# Patient Record
Sex: Male | Born: 1945 | Race: White | Hispanic: No | Marital: Single | Smoking: Former smoker
Health system: Southern US, Community
[De-identification: ages and names within clinical notes are randomized; demographics above are authoritative.]

## PROBLEM LIST (undated history)

## (undated) DIAGNOSIS — C189 Malignant neoplasm of colon, unspecified: Secondary | ICD-10-CM

## (undated) DIAGNOSIS — C787 Secondary malignant neoplasm of liver and intrahepatic bile duct: Secondary | ICD-10-CM

## (undated) DIAGNOSIS — C7951 Secondary malignant neoplasm of bone: Secondary | ICD-10-CM

## (undated) HISTORY — PX: SHOULDER SURGERY: SHX246

## (undated) HISTORY — PX: KNEE SURGERY: SHX244

## (undated) HISTORY — PX: APPENDECTOMY: SHX54

---

## 2006-11-15 ENCOUNTER — Ambulatory Visit (HOSPITAL_COMMUNITY): Admission: RE | Admit: 2006-11-15 | Discharge: 2006-11-15 | Payer: Self-pay | Admitting: Orthopedic Surgery

## 2006-11-18 ENCOUNTER — Encounter: Admission: RE | Admit: 2006-11-18 | Discharge: 2006-11-18 | Payer: Self-pay | Admitting: Orthopedic Surgery

## 2008-12-14 ENCOUNTER — Encounter: Admission: RE | Admit: 2008-12-14 | Discharge: 2008-12-14 | Payer: Self-pay | Admitting: Orthopedic Surgery

## 2008-12-23 ENCOUNTER — Ambulatory Visit (HOSPITAL_COMMUNITY): Admission: RE | Admit: 2008-12-23 | Discharge: 2008-12-24 | Payer: Self-pay | Admitting: Orthopedic Surgery

## 2011-01-13 LAB — HEMOGLOBIN AND HEMATOCRIT, BLOOD: HCT: 43.3 % (ref 39.0–52.0)

## 2011-02-15 NOTE — Op Note (Signed)
NAMEJOAQUIN, KNEBEL                ACCOUNT NO.:  [id number]   MEDICAL RECORD NO.:  [id number]          PATIENT TYPE:  AMB   LOCATION:  DAY                          FACILITY:  [facility]   PHYSICIAN:  Marlowe Kays, M.D.  DATE OF BIRTH:  1945/12/29   DATE OF PROCEDURE:  DATE OF DISCHARGE:                               OPERATIVE REPORT   PREOPERATIVE DIAGNOSES:  Rotator cuff tear right shoulder.   POSTOPERATIVE DIAGNOSIS:  Rotator cuff tear right shoulder.   OPERATION:  Anterior acromionectomy, planing of distal clavicle and  repair of torn rotator cuff right shoulder.   SURGEON:  [place]   ASSISTANT:  Mr. Idolina Primer, [place].   ANESTHESIA:  General.   PATHOLOGIST:   INDICATIONS FOR PROCEDURE:  He has had significant right shoulder pain  leading to an MRI on December 14, 2008 which showed a large full-thickness  retracted tear of supraspinatus tendon.  This was confirmed at surgery  with a 3 mm wide and 2 cm retracted tear.  This was associated with a  very significant impingement problem as discussed below.   PROCEDURE:  Prophylactic antibiotics, interscalene block by anesthesia  prior to satisfactory general anesthesia, placed on the sliding frame  and in beach-chair position, the right shoulder girdle was prepped with  DuraPrep, draped in sterile field.  Time-out performed.  Vertical  midline incision down to the fascia overlying the anterior acromion  which I opened in line with the skin incision.  I placed a small Cobb  elevator beneath the anterior acromion.  It was a very tight impingement  problem. I made my initial anterior acromionectomy with micro saw and  then began removing additional bone for further decompression, worked  back up underneath the distal clavicle which also was creating  impingement problem as well.  He had a very thick fascia on top of the  rotator cuff which I excised and I was able to outline the rotator cuff  tear.  The rotator cuff itself appeared to  be healthy with about a 3 cm  width and 2 cm retraction.  After assessing the anatomy of the tear, I  used to four stranded striker rotator cuff anchors interspersing them  throughout the width of the tear and tying them down.  I then used the  terminal portions of each suture to work through the terminal portion of  the rotator cuff into the soft tissue of the lateral humerus giving  additional strength and smoothing it down.  At the conclusion of the  repair, the repair was seen to be nice and stable with his arm to his  side.  Part of the repair was medial to the biceps tendon which was a  little discolored but was intact.  The wound was then well irrigated  with sterile saline and I closed the deltoid and deltoid fascia interval  with  interrupted #1 Vicryl, subcu tissue with 2-0 Vicryl, Steri-Strips on the  skin followed by dry sterile dressing and shoulder immobilizer.  He  tolerated the procedure well was taken to recovery in satisfactory  condition with no known complications.  ______________________________  Marlowe Kays, M.D.     JA/MEDQ  D:  12/23/2008  T:  12/23/2008  Job:  [id number]

## 2015-02-12 ENCOUNTER — Other Ambulatory Visit: Payer: Self-pay | Admitting: Family Medicine

## 2015-02-12 DIAGNOSIS — R634 Abnormal weight loss: Secondary | ICD-10-CM

## 2015-02-16 ENCOUNTER — Other Ambulatory Visit (HOSPITAL_COMMUNITY): Payer: Self-pay

## 2015-02-16 ENCOUNTER — Other Ambulatory Visit: Payer: Self-pay

## 2015-02-16 ENCOUNTER — Encounter (HOSPITAL_COMMUNITY): Payer: Self-pay

## 2015-02-16 ENCOUNTER — Inpatient Hospital Stay (HOSPITAL_COMMUNITY)
Admission: EM | Admit: 2015-02-16 | Discharge: 2015-02-19 | DRG: 811 | Disposition: A | Discharge status: Home / Self Care | Payer: Medicare Other | Attending: Internal Medicine | Admitting: Internal Medicine

## 2015-02-16 ENCOUNTER — Inpatient Hospital Stay (HOSPITAL_COMMUNITY): Payer: Medicare Other

## 2015-02-16 ENCOUNTER — Inpatient Hospital Stay: Admission: RE | Admit: 2015-02-16 | Payer: Self-pay | Source: Ambulatory Visit

## 2015-02-16 DIAGNOSIS — Z885 Allergy status to narcotic agent status: Secondary | ICD-10-CM | POA: Diagnosis not present

## 2015-02-16 DIAGNOSIS — Z681 Body mass index (BMI) 19 or less, adult: Secondary | ICD-10-CM | POA: Diagnosis not present

## 2015-02-16 DIAGNOSIS — D649 Anemia, unspecified: Secondary | ICD-10-CM | POA: Diagnosis not present

## 2015-02-16 DIAGNOSIS — Z79899 Other long term (current) drug therapy: Secondary | ICD-10-CM

## 2015-02-16 DIAGNOSIS — C189 Malignant neoplasm of colon, unspecified: Secondary | ICD-10-CM | POA: Diagnosis present

## 2015-02-16 DIAGNOSIS — Z87891 Personal history of nicotine dependence: Secondary | ICD-10-CM

## 2015-02-16 DIAGNOSIS — E43 Unspecified severe protein-calorie malnutrition: Secondary | ICD-10-CM | POA: Diagnosis present

## 2015-02-16 DIAGNOSIS — D473 Essential (hemorrhagic) thrombocythemia: Secondary | ICD-10-CM | POA: Diagnosis present

## 2015-02-16 DIAGNOSIS — R197 Diarrhea, unspecified: Secondary | ICD-10-CM

## 2015-02-16 DIAGNOSIS — D509 Iron deficiency anemia, unspecified: Secondary | ICD-10-CM

## 2015-02-16 DIAGNOSIS — R0609 Other forms of dyspnea: Secondary | ICD-10-CM | POA: Diagnosis present

## 2015-02-16 DIAGNOSIS — E46 Unspecified protein-calorie malnutrition: Secondary | ICD-10-CM | POA: Diagnosis present

## 2015-02-16 DIAGNOSIS — Z791 Long term (current) use of non-steroidal anti-inflammatories (NSAID): Secondary | ICD-10-CM | POA: Diagnosis not present

## 2015-02-16 DIAGNOSIS — R935 Abnormal findings on diagnostic imaging of other abdominal regions, including retroperitoneum: Secondary | ICD-10-CM

## 2015-02-16 DIAGNOSIS — Z7982 Long term (current) use of aspirin: Secondary | ICD-10-CM

## 2015-02-16 DIAGNOSIS — R0600 Dyspnea, unspecified: Secondary | ICD-10-CM | POA: Diagnosis not present

## 2015-02-16 DIAGNOSIS — K529 Noninfective gastroenteritis and colitis, unspecified: Secondary | ICD-10-CM | POA: Diagnosis present

## 2015-02-16 DIAGNOSIS — R5383 Other fatigue: Secondary | ICD-10-CM | POA: Diagnosis not present

## 2015-02-16 DIAGNOSIS — R634 Abnormal weight loss: Secondary | ICD-10-CM | POA: Diagnosis present

## 2015-02-16 DIAGNOSIS — C787 Secondary malignant neoplasm of liver and intrahepatic bile duct: Secondary | ICD-10-CM | POA: Diagnosis present

## 2015-02-16 DIAGNOSIS — D500 Iron deficiency anemia secondary to blood loss (chronic): Principal | ICD-10-CM | POA: Diagnosis present

## 2015-02-16 DIAGNOSIS — E8809 Other disorders of plasma-protein metabolism, not elsewhere classified: Secondary | ICD-10-CM | POA: Diagnosis present

## 2015-02-16 DIAGNOSIS — D75839 Thrombocytosis, unspecified: Secondary | ICD-10-CM | POA: Diagnosis present

## 2015-02-16 DIAGNOSIS — D508 Other iron deficiency anemias: Secondary | ICD-10-CM | POA: Diagnosis not present

## 2015-02-16 HISTORY — DX: Secondary malignant neoplasm of liver and intrahepatic bile duct: C78.7

## 2015-02-16 LAB — PREPARE RBC (CROSSMATCH)

## 2015-02-16 LAB — CBC WITH DIFFERENTIAL/PLATELET
BASOS ABS: 0.1 10*3/uL (ref 0.0–0.1)
Basophils Relative: 1 % (ref 0–1)
EOS ABS: 0.2 10*3/uL (ref 0.0–0.7)
Eosinophils Relative: 2 % (ref 0–5)
HCT: 23.9 % — ABNORMAL LOW (ref 39.0–52.0)
Hemoglobin: 6.7 g/dL — CL (ref 13.0–17.0)
LYMPHS ABS: 1 10*3/uL (ref 0.7–4.0)
Lymphocytes Relative: 12 % (ref 12–46)
MCH: 18.2 pg — AB (ref 26.0–34.0)
MCHC: 28 g/dL — ABNORMAL LOW (ref 30.0–36.0)
MCV: 64.9 fL — AB (ref 78.0–100.0)
MONO ABS: 1 10*3/uL (ref 0.1–1.0)
Monocytes Relative: 12 % (ref 3–12)
NEUTROS PCT: 73 % (ref 43–77)
Neutro Abs: 6 10*3/uL (ref 1.7–7.7)
PLATELETS: 1067 10*3/uL — AB (ref 150–400)
RBC: 3.68 MIL/uL — ABNORMAL LOW (ref 4.22–5.81)
RDW: 18.4 % — AB (ref 11.5–15.5)
WBC: 8.3 10*3/uL (ref 4.0–10.5)

## 2015-02-16 LAB — TSH: TSH: 4.504 u[IU]/mL — AB (ref 0.350–4.500)

## 2015-02-16 LAB — RETICULOCYTES
RBC.: 3.79 MIL/uL — AB (ref 4.22–5.81)
RETIC COUNT ABSOLUTE: 53.1 10*3/uL (ref 19.0–186.0)
RETIC CT PCT: 1.4 % (ref 0.4–3.1)

## 2015-02-16 LAB — COMPREHENSIVE METABOLIC PANEL
ALBUMIN: 2.8 g/dL — AB (ref 3.5–5.0)
ALK PHOS: 106 U/L (ref 38–126)
ALT: 13 U/L — ABNORMAL LOW (ref 17–63)
ANION GAP: 9 (ref 5–15)
AST: 15 U/L (ref 15–41)
BUN: 20 mg/dL (ref 6–20)
CO2: 26 mmol/L (ref 22–32)
CREATININE: 0.81 mg/dL (ref 0.61–1.24)
Calcium: 8.9 mg/dL (ref 8.9–10.3)
Chloride: 100 mmol/L — ABNORMAL LOW (ref 101–111)
GFR calc Af Amer: >60 mL/min (ref >60)
GFR calc non Af Amer: >60 mL/min (ref >60)
Glucose, Bld: 97 mg/dL (ref 65–99)
POTASSIUM: 4.1 mmol/L (ref 3.5–5.1)
Sodium: 135 mmol/L (ref 135–145)
TOTAL PROTEIN: 6.3 g/dL — AB (ref 6.5–8.1)
Total Bilirubin: 0.3 mg/dL (ref 0.3–1.2)

## 2015-02-16 LAB — IRON AND TIBC
IRON: 8 ug/dL — AB (ref 45–182)
SATURATION RATIOS: 2 % — AB (ref 17.9–39.5)
TIBC: 347 ug/dL (ref 250–450)
UIBC: 339 ug/dL

## 2015-02-16 LAB — FOLATE: FOLATE: 27 ng/mL (ref >5.9)

## 2015-02-16 LAB — POC OCCULT BLOOD, ED: Fecal Occult Bld: NEGATIVE

## 2015-02-16 LAB — D-DIMER, QUANTITATIVE (NOT AT ARMC): D DIMER QUANT: 0.92 ug{FEU}/mL — AB (ref 0.00–0.48)

## 2015-02-16 LAB — LIPASE, BLOOD: LIPASE: 35 U/L (ref 22–51)

## 2015-02-16 LAB — PREALBUMIN: Prealbumin: 19.6 mg/dL (ref 18–38)

## 2015-02-16 LAB — SEDIMENTATION RATE: SED RATE: 34 mm/h — AB (ref 0–16)

## 2015-02-16 LAB — ABO/RH: ABO/RH(D): O POS

## 2015-02-16 LAB — VITAMIN B12: VITAMIN B 12: 651 pg/mL (ref 180–914)

## 2015-02-16 LAB — FERRITIN: FERRITIN: 15 ng/mL — AB (ref 24–336)

## 2015-02-16 MED ORDER — SODIUM CHLORIDE 0.9 % IJ SOLN
3.0000 mL | Freq: Two times a day (BID) | INTRAMUSCULAR | Status: DC
  Administered 2015-02-17 – 2015-02-19 (×5): 3 mL via INTRAVENOUS

## 2015-02-16 MED ORDER — SODIUM CHLORIDE 0.9 % IV SOLN
25.0000 mg | Freq: Once | INTRAVENOUS | Status: AC
  Administered 2015-02-16: 25 mg via INTRAVENOUS
  Filled 2015-02-16 (×2): qty 0.5

## 2015-02-16 MED ORDER — ONDANSETRON HCL 4 MG PO TABS: 4.0000 mg | ORAL_TABLET | Freq: Four times a day (QID) | ORAL | Status: DC | PRN

## 2015-02-16 MED ORDER — OXYCODONE HCL 5 MG PO TABS
5.0000 mg | ORAL_TABLET | ORAL | Status: DC | PRN
  Administered 2015-02-17: 5 mg via ORAL
  Filled 2015-02-16: qty 1

## 2015-02-16 MED ORDER — SODIUM CHLORIDE 0.9 % IV SOLN
250.0000 mg | Freq: Once | INTRAVENOUS | Status: AC
  Administered 2015-02-16: 250 mg via INTRAVENOUS
  Filled 2015-02-16 (×3): qty 5

## 2015-02-16 MED ORDER — ACETAMINOPHEN 650 MG RE SUPP: 650.0000 mg | Freq: Four times a day (QID) | RECTAL | Status: DC | PRN

## 2015-02-16 MED ORDER — ACETAMINOPHEN 325 MG PO TABS: 650.0000 mg | ORAL_TABLET | Freq: Four times a day (QID) | ORAL | Status: DC | PRN

## 2015-02-16 MED ORDER — IOHEXOL 300 MG/ML  SOLN: 25.0000 mL | Freq: Once | INTRAMUSCULAR | Status: AC | PRN

## 2015-02-16 MED ORDER — MORPHINE SULFATE 2 MG/ML IJ SOLN
1.0000 mg | INTRAMUSCULAR | Status: DC | PRN
  Administered 2015-02-16: 1 mg via INTRAVENOUS
  Filled 2015-02-16: qty 1

## 2015-02-16 MED ORDER — ALUM & MAG HYDROXIDE-SIMETH 200-200-20 MG/5ML PO SUSP: 30.0000 mL | Freq: Four times a day (QID) | ORAL | Status: DC | PRN

## 2015-02-16 MED ORDER — ONDANSETRON HCL 4 MG/2ML IJ SOLN: 4.0000 mg | Freq: Four times a day (QID) | INTRAMUSCULAR | Status: DC | PRN

## 2015-02-16 MED ORDER — SODIUM CHLORIDE 0.9 % IV SOLN
Freq: Once | INTRAVENOUS | Status: AC
  Administered 2015-02-16: 16:00:00 via INTRAVENOUS

## 2015-02-16 MED ORDER — HEPARIN SODIUM (PORCINE) 5000 UNIT/ML IJ SOLN
5000.0000 [IU] | Freq: Three times a day (TID) | INTRAMUSCULAR | Status: DC
Start: 2015-02-16 — End: 2015-02-17
  Administered 2015-02-17 (×2): 5000 [IU] via SUBCUTANEOUS
  Filled 2015-02-16 (×3): qty 1

## 2015-02-16 MED ORDER — IOHEXOL 300 MG/ML  SOLN
80.0000 mL | Freq: Once | INTRAMUSCULAR | Status: AC | PRN
  Administered 2015-02-16: 80 mL via INTRAVENOUS

## 2015-02-16 NOTE — ED Notes (Signed)
1067 - Platelets  6.7- Hemoglobin  Reported to Dr. Regenia Skeeter @1222 

## 2015-02-16 NOTE — ED Notes (Signed)
Admitting at bedside 

## 2015-02-16 NOTE — ED Notes (Signed)
Pt comes in after being called by PCP for having abnormal labs. Reports trouble with bowels for the past couple of months, also reports shortness of breath.

## 2015-02-16 NOTE — Progress Notes (Signed)
Patient ID: Ryan Cowan, male   DOB: 07-11-1946, 69 y.o.   MRN: [id number] Hematology: Courtesy consult: 69 year old man who presents with an approximate three-month history of diarrhea without melena or hematochezia, approximate 15 pound weight loss, and increasing weakness and dyspnea. He is found to have a significant microcytic anemia with hemoglobin 6.7, hematocrit 24%, MCV 65, white count 8300 with 73% neutrophils, platelet count 1,067,000. Reticulocyte count 1.4%. No antibodies detected on a type and crossmatch. Iron studies confirm clinical suspicion of severe iron deficiency with serum iron 8, percent saturation 2, TIBC 347, and ferritin 15. I have reviewed his peripheral blood film. Findings are typical of iron deficiency with hypochromic, microcytic, red cells, 2-3 plus anisopoikilocytosis. RDW is elevated. Neutrophils and lymphocytes appear mature. 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 In view of unexplained GI symptoms and weight loss, evaluation indicated to rule out GI malignancy. In general, reactive thrombocytosis does not increase risk for thrombotic events. Routine thromboprophylaxis is sufficient.

## 2015-02-16 NOTE — H&P (Signed)
[facility] History and Physical                                                                                    Ryan Cowan, is a 69 y.o. male  MRN: [id number]   DOB - Jun 06, 1946  Admit Date - 02/16/2015  Outpatient Primary MD for the patient is Ryan Downing, MD  Referring MD: Post / ER  With History of -  History reviewed. No pertinent past medical history.    Past Surgical History  Procedure Laterality Date  . Shoulder surgery    . Knee surgery    . Appendectomy      in for   Chief Complaint  Patient presents with  . Shortness of Breath  . 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 Patient was initially evaluated at his primary care physician's office today for several complaints. First is had ongoing diarrhea for several months and recently has noticed increased shortness of breath and dyspnea on exertion. Regarding the diarrhea, again this is been going on for several months despite type of food intake. He occasionally has fecal urgency symptoms prior to onset of bouts of diarrhea, he has chronic abdominal bloating, he's not noticed any blood or dark stools, has not had any cramping or abdominal pain otherwise prior to onset of stools. He describes the stools as being very loose and brown in color and occasionally he does have some formed stools. Patient reports that several years ago his baseline weight was 165 pounds, 1 year ago was documented at 145 pounds at his primary care physician's office and today weight is down to 128 pounds. In regards to his shortness of breath and dyspnea on exertion this has been ongoing for several months and since the onset of his diarrhea. He sleeps mainly in the recliner because he feels claustrophobic laying down in his bed in his room. At times he will awaken with sweats at night  and feel generally weak. Sometimes when he is up in the recliner with his feet elevated he feels like he can't rest or breathe so he has to get up and walk around. He does have a history of hemorrhoids and does have some rectal pain that radiates to the perineum after passage of bowel movements at times. Several weeks ago he had what he described as a dry barking cough was nonproductive. He is having difficulty quantifying whether he has any difficulty swallowing although when the question was rephrased as are you having difficulty swallowing meats or salads he stated no. He also reports for several years intermittent left anterior chest pain without any specific aggravating or alleviating factors and no other associated symptoms with this chest pain.  In the ER he was afebrile and hypertensive and heart rate 92. Room air saturations are 100%. Electrolyte panel was unremarkable except for albumin of 2.8 ALT 13 and total protein of 6.3. More worrisome was his CBC he had a hemoglobin of 6.7, MCV of 64.9, and  platelets were markedly elevated at 1,067,000. That's my evaluation we have asked for a d-dimer which was mildly positive at 0.92. I have ordered a 2 view chest x-ray as well as a CT of the abdomen and pelvis with contrast which is pending at time of admission. ER he was fecal occult blood negative   Review of Systems   In addition to the HPI above,  No Fever-chills, myalgias or other constitutional symptoms No Headache, changes with Vision or hearing, new weakness, tingling, numbness in any extremity, No problems swallowing food or Liquids, indigestion/reflux No palpitations No N/V; no melena or hematochezia, no dark tarry stools,  No dysuria, hematuria or flank pain No new skin rashes, lesions, masses or bruises, No new joints pains-aches No recent weight gain or loss No polyuria, polydypsia or polyphagia,  *A full 10 point Review of Systems was done, except as stated above, all other Review of  Systems were negative.  Social History History  Substance Use Topics  . Smoking status: Former Smoker    Quit date: 10/04/2011  . Smokeless tobacco: Not on file  . Alcohol Use: No    Resides at: Private residence  Lives with: Alone  Ambulatory status: Without assistive devices prior to admission   Family History Father: Deceased, history of CAD and CVA Mother: CAD and hypertension   Prior to Admission medications   Medication Sig Start Date End Date Taking? Authorizing Provider  acetaminophen (TYLENOL) 500 MG tablet Take 500 mg by mouth every 6 (six) hours as needed (pain).   Yes Historical Provider, MD  aspirin EC 81 MG tablet Take 81 mg by mouth daily.   Yes Historical Provider, MD  diphenhydramine-acetaminophen (TYLENOL PM) 25-500 MG TABS Take 1 tablet by mouth at bedtime as needed (sleep).   Yes Historical Provider, MD  Multiple Vitamin (MULTIVITAMIN WITH MINERALS) TABS tablet Take 1 tablet by mouth daily. Centrum Silver 50 plus   Yes Historical Provider, MD  naproxen sodium (ANAPROX) 220 MG tablet Take 220 mg by mouth 2 (two) times daily as needed (pain).   Yes Historical Provider, MD  OVER THE COUNTER MEDICATION Place 1 application into both nostrils daily as needed (congestion). Vicks inhaler stick   Yes Historical Provider, MD  phenylephrine-shark liver oil-mineral oil-petrolatum (PREPARATION H) 0.25-3-14-71.9 % rectal ointment Place 1 application rectally 3 (three) times daily as needed for hemorrhoids.   Yes Historical Provider, MD    Allergies  Allergen Reactions  . Codeine Other (See Comments)    hallucinations    Physical Exam  Vitals  Blood pressure 134/84, pulse 68, temperature 98.1 F (36.7 C), temperature source Oral, resp. rate 15, height 6' 2"  (1.88 m), weight 128 lb (58.06 kg), SpO2 100 %.   General:  In no acute distress, appears older than stated age in malnourished  Psych:  Normal affect, Denies Suicidal or Homicidal ideations, Awake Alert,  Oriented X 3. Speech and thought patterns are clear and appropriate, no apparent short term memory deficits-airy talkative  Neuro:   No focal neurological deficits, CN II through XII intact, Strength 5/5 all 4 extremities, Sensation intact all 4 extremities.  ENT:  Ears and Eyes appear Normal, Conjunctivae clear, PER. Moist oral mucosa without erythema or exudates.  Neck:  Supple, No lymphadenopathy appreciated  Respiratory:  Symmetrical chest wall movement, Good air movement bilaterally, CTAB. Room Air  Cardiac:  RRR, No Murmurs, no LE edema noted, no JVD, No carotid bruits, peripheral pulses palpable at 2+  Abdomen:  Positive bowel sounds,  Soft and somewhat bloated below the umbilicus, tender mid abdomen just above suprapubic region without guarding or rebounding, No masses appreciated, no obvious hepatosplenomegaly  Skin:  No Cyanosis, Normal Skin Turgor, No Skin Rash or Bruise.  Extremities: Symmetrical without obvious trauma or injury,  no effusions.  Data Review  CBC  Recent Labs Lab 02/16/15 1133  WBC 8.3  HGB 6.7*  HCT 23.9*  PLT 1067*  MCV 64.9*  MCH 18.2*  MCHC 28.0*  RDW 18.4*  LYMPHSABS 1.0  MONOABS 1.0  EOSABS 0.2  BASOSABS 0.1    Chemistries   Recent Labs Lab 02/16/15 1133  NA 135  K 4.1  CL 100*  CO2 26  GLUCOSE 97  BUN 20  CREATININE 0.81  CALCIUM 8.9  AST 15  ALT 13*  ALKPHOS 106  BILITOT 0.3    estimated creatinine clearance is 70.7 mL/min (by C-G formula based on Cr of 0.81).  No results for input(s): TSH, T4TOTAL, T3FREE, THYROIDAB in the last 72 hours.  Invalid input(s): FREET3  Coagulation profile No results for input(s): INR, PROTIME in the last 168 hours.   Recent Labs  02/16/15 1303  DDIMER 0.92*    Cardiac Enzymes No results for input(s): CKMB, TROPONINI, MYOGLOBIN in the last 168 hours.  Invalid input(s): CK  Invalid input(s): POCBNP  Urinalysis No results found for: COLORURINE, APPEARANCEUR, LABSPEC,  [place], GLUCOSEU, HGBUR, BILIRUBINUR, KETONESUR, PROTEINUR, UROBILINOGEN, NITRITE, LEUKOCYTESUR  Imaging results:   No results found.   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 -Admit to medical floor -Etiology of anemia uncertain noting currently is heme negative -Differential includes secondary myelodysplastic syndrome in setting of chronic anemia (possibly from celiac sprue/poor nutrition) patient has hypoalbuminemia and protein calorie malnutrition -Thrombocytosis could also be reactive although given patient's smoking history certainly must consider neoplastic syndrome/malignancy in the differential -Check anemia panel, TSH -MCV 64.9 -likely severe iron deficiency anemia influencing -peripheral smear pending -Transfuse 2 units packed red blood cells -Spoke with Dr. Beryle Beams of hematology-suspects primary severe iron deficiency contributing and recommends proceeding with iron replacement today-he will review peripheral smear but at this juncture does not suspect a primary myelodysplastic syndrome and suspects this is all reactive related to severe iron deficiency anemia-agrees that malignancy workup should be undertaken and recommends GI consultation; agrees with CT of the abdomen and pelvis as well as chest x-ray ordered given patient's history of tobacco abuse-the peripheral smear abnormal than he will perform a formal consultation. Please note that patient will require multiple doses of iron and it will take at least 6-8 weeks for hemoglobin to respond.  Active Problems:   DOE (dyspnea on exertion) -Seems related to symptomatic anemia -Given thrombocytosis patient is at increased risk for DVT and subsequent development of PE; we did check a d-dimer which was mildly elevated at 0.92 -Patient not hypoxic and not having any acute  sharp chest pain and does not have lower extremity edema so highly doubtful has pulmonary embolism    Chronic diarrhea -Differential includes durable bowel syndrome versus celiac sprue versus inflammatory bowel disease -consult GI -CT the abdomen and pelvis as above -Check fecal lactoferrin -No recent antibiotics or leukocytosis of doubt C. difficile colitis -Rule out rheumatological issues so check ESR rheumatoid factor and ANA -Check tissue transglutaminase IgA and IgG and start on gluten-free diet as precaution    Former tobacco use -Check two-view  chest x-ray -Suspect has underlying COPD    Weight loss, non-intentional/Hypoalbuminemia due to protein-calorie malnutrition -Significant unintentional weight loss over the past several years and seems record proportional to degree of food intolerance and diarrhea -Workup as above -Pre-albumin -Nutrition consultation    DVT Prophylaxis: Subcutaneous heparin  Family Communication: Sister at bedside    Code Status:  Full code  Condition:  Stable  Discharge disposition: Anticipate we will discharge back to home pending anemia workup this admission  Time spent in minutes : 60      Adylee Leonardo L. ANP on 02/16/2015 at 1:55 PM  Between 7am to 7pm - Pager - [phone]  After 7pm go to [email or web address] - password [email or web address]  And look for the night coverage person covering me after hours  [facility]

## 2015-02-16 NOTE — ED Provider Notes (Signed)
CSN: [id number]     Arrival date & time 02/16/15  1041 History   First MD Initiated Contact with Patient 02/16/15 1044     Chief Complaint  Patient presents with  . Shortness of Breath  . GI Problem     (Consider location/radiation/quality/duration/timing/severity/associated sxs/prior Treatment) HPI Comments: 69 year old male transferred from PCP office with abnormal labs. Patient reports he initially saw his PCP as he was having diarrhea multiple times per day for several months. Over the past several weeks she's been feeling increasingly more fatigued and short of breath with exertion. Denies any vomiting nausea. Has no abdominal pain.  Patient is a 69 y.o. male presenting with GI illness.  GI Problem This is a new problem. The current episode started more than 1 month ago. The problem occurs constantly. The problem has been unchanged. Associated symptoms include fatigue. Pertinent negatives include no abdominal pain, arthralgias, chest pain, congestion, fever, headaches or rash. Nothing aggravates the symptoms. He has tried nothing for the symptoms. The treatment provided no relief.    History reviewed. No pertinent past medical history. Past Surgical History  Procedure Laterality Date  . Shoulder surgery    . Knee surgery    . Appendectomy     History reviewed. No pertinent family history. History  Substance Use Topics  . Smoking status: Former Smoker    Quit date: 10/04/2011  . Smokeless tobacco: Not on file  . Alcohol Use: No    Review of Systems  Constitutional: Positive for fatigue. Negative for fever.  HENT: Negative for congestion.   Respiratory: Positive for shortness of breath (with exertion).   Cardiovascular: Negative for chest pain.  Gastrointestinal: Negative for abdominal pain.  Musculoskeletal: Negative for arthralgias.  Skin: Negative for rash.  Neurological: Negative for headaches.  Psychiatric/Behavioral: Negative for confusion.  All other systems  reviewed and are negative.     Allergies  Codeine  Home Medications   Prior to Admission medications   Medication Sig Start Date End Date Taking? Authorizing Provider  acetaminophen (TYLENOL) 500 MG tablet Take 500 mg by mouth every 6 (six) hours as needed (pain).   Yes Historical Provider, MD  aspirin EC 81 MG tablet Take 81 mg by mouth daily.   Yes Historical Provider, MD  diphenhydramine-acetaminophen (TYLENOL PM) 25-500 MG TABS Take 1 tablet by mouth at bedtime as needed (sleep).   Yes Historical Provider, MD  Multiple Vitamin (MULTIVITAMIN WITH MINERALS) TABS tablet Take 1 tablet by mouth daily. Centrum Silver 50 plus   Yes Historical Provider, MD  naproxen sodium (ANAPROX) 220 MG tablet Take 220 mg by mouth 2 (two) times daily as needed (pain).   Yes Historical Provider, MD  OVER THE COUNTER MEDICATION Place 1 application into both nostrils daily as needed (congestion). Vicks inhaler stick   Yes Historical Provider, MD  phenylephrine-shark liver oil-mineral oil-petrolatum (PREPARATION H) 0.25-3-14-71.9 % rectal ointment Place 1 application rectally 3 (three) times daily as needed for hemorrhoids.   Yes Historical Provider, MD   BP 122/73 mmHg  Pulse 68  Temp(Src) 98.1 F (36.7 C) (Oral)  Resp 14  Ht 6\' 2"  (1.88 m)  Wt 128 lb (58.06 kg)  BMI 16.43 kg/m2  SpO2 100% Physical Exam  Constitutional: He is oriented to person, place, and time. He appears well-developed.  HENT:  Head: Normocephalic and atraumatic.  Eyes: Pupils are equal, round, and reactive to light.  Neck: Normal range of motion.  Cardiovascular: Normal rate.   Pulmonary/Chest: Effort normal. No respiratory distress.  Abdominal: Soft. He exhibits no distension. There is no tenderness.  Genitourinary: Guaiac negative stool.  Small hemorrhoid.  Musculoskeletal: He exhibits no tenderness.  Neurological: He is alert and oriented to person, place, and time. No cranial nerve deficit. He exhibits normal muscle tone.   Skin: Skin is warm.  Psychiatric: He has a normal mood and affect.    ED Course  Procedures (including critical care time) Labs Review Labs Reviewed  CBC WITH DIFFERENTIAL/PLATELET - Abnormal; Notable for the following:    RBC 3.68 (*)    Hemoglobin 6.7 (*)    HCT 23.9 (*)    MCV 64.9 (*)    MCH 18.2 (*)    MCHC 28.0 (*)    RDW 18.4 (*)    Platelets 1067 (*)    All other components within normal limits  COMPREHENSIVE METABOLIC PANEL - Abnormal; Notable for the following:    Chloride 100 (*)    Total Protein 6.3 (*)    Albumin 2.8 (*)    ALT 13 (*)    All other components within normal limits  IRON AND TIBC - Abnormal; Notable for the following:    Iron 8 (*)    Saturation Ratios 2 (*)    All other components within normal limits  FERRITIN - Abnormal; Notable for the following:    Ferritin 15 (*)    All other components within normal limits  RETICULOCYTES - Abnormal; Notable for the following:    RBC. 3.79 (*)    All other components within normal limits  TSH - Abnormal; Notable for the following:    TSH 4.504 (*)    All other components within normal limits  SEDIMENTATION RATE - Abnormal; Notable for the following:    Sed Rate 34 (*)    All other components within normal limits  D-DIMER, QUANTITATIVE - Abnormal; Notable for the following:    D-Dimer, Quant 0.92 (*)    All other components within normal limits  LIPASE, BLOOD  VITAMIN B12  PREALBUMIN  PATHOLOGIST SMEAR REVIEW  FOLATE  RHEUMATOID FACTOR  TISSUE TRANSGLUTAMINASE, IGA  TISSUE TRANSGLUTAMINASE, IGG  FECAL LACTOFERRIN  POC OCCULT BLOOD, ED  TYPE AND SCREEN  ABO/RH  PREPARE RBC (CROSSMATCH)    Imaging Review Dg Chest 2 View  02/16/2015   CLINICAL DATA:  Fever for 3 days  EXAM: CHEST  2 VIEW  COMPARISON:  None.  FINDINGS: The heart size and mediastinal contours are within normal limits. Both lungs are clear. The visualized skeletal structures are unremarkable.  IMPRESSION: No active  cardiopulmonary disease.   Electronically Signed   By: Inez Catalina M.D.   On: 02/16/2015 14:37     EKG Interpretation None      MDM  69 year old male sent from PCP with concern for anemia. On arrival patient is stable. He is complaining of some fatigue and shortness of breath with exertion. Labs significant for a hemoglobin of 6.7. Platelets greater than 1000. Guaiac is negative. Do not suspect a GI bleed or other blood loss acute loss. Concern for possible myelodysplastic syndrome. Discussed with hospitalist on-call who will admit him. No further acute issues emergency department  Final diagnoses:  Anemia, unspecified anemia type        Robynn Pane, MD 02/16/15 [place], MD 02/18/15 [phone]

## 2015-02-16 NOTE — ED Notes (Signed)
Admitting MD at bedside.

## 2015-02-16 NOTE — Consult Note (Signed)
Consultation  Referring Provider:    [facility]   Primary Care Physician:  Leonard Downing, MD Primary Gastroenterologist: none        Reason for Consultation:  Anemia             HPI:   Ryan Cowan is a 69 y.o. male who presented to ED upon suggestion of PCP because of abnormal labs. He presents with severe microcytic anemia. He has lost excessive weight and been having loose stools at home. No abdominal pain, just RLQ tenderness. No overt GI bleeding at home. He complains of loose stools over last few months. No constipation.   Past Surgical History  Procedure Laterality Date  . Shoulder surgery    . Knee surgery    . Appendectomy      [place]:  No colon cancers  History  Substance Use Topics  . Smoking status: Former Smoker    Quit date: 10/04/2011  . Smokeless tobacco: Not on file  . Alcohol Use: No    Prior to Admission medications   Medication Sig Start Date End Date Taking? Authorizing Provider  acetaminophen (TYLENOL) 500 MG tablet Take 500 mg by mouth every 6 (six) hours as needed (pain).   Yes Historical Provider, MD  aspirin EC 81 MG tablet Take 81 mg by mouth daily.   Yes Historical Provider, MD  diphenhydramine-acetaminophen (TYLENOL PM) 25-500 MG TABS Take 1 tablet by mouth at bedtime as needed (sleep).   Yes Historical Provider, MD  Multiple Vitamin (MULTIVITAMIN WITH MINERALS) TABS tablet Take 1 tablet by mouth daily. Centrum Silver 50 plus   Yes Historical Provider, MD  naproxen sodium (ANAPROX) 220 MG tablet Take 220 mg by mouth 2 (two) times daily as needed (pain).   Yes Historical Provider, MD  OVER THE COUNTER MEDICATION Place 1 application into both nostrils daily as needed (congestion). Vicks inhaler stick   Yes Historical Provider, MD  phenylephrine-shark liver oil-mineral oil-petrolatum (PREPARATION H) 0.25-3-14-71.9 % rectal ointment Place 1 application rectally 3 (three) times daily as needed for hemorrhoids.   Yes Historical Provider,  MD    Current Facility-Administered Medications  Medication Dose Route Frequency Provider Last Rate Last Dose  . acetaminophen (TYLENOL) tablet 650 mg  650 mg Oral Q6H PRN Samella Parr, NP       Or  . acetaminophen (TYLENOL) suppository 650 mg  650 mg Rectal Q6H PRN Samella Parr, NP      . alum & mag hydroxide-simeth (MAALOX/MYLANTA) 200-200-20 MG/5ML suspension 30 mL  30 mL Oral Q6H PRN Samella Parr, NP      . heparin injection 5,000 Units  5,000 Units Subcutaneous 3 times per day Samella Parr, NP      . iohexol (OMNIPAQUE) 300 MG/ML solution 25 mL  25 mL Oral Once PRN Medication Radiologist, MD      . iron dextran complex (INFED) 25 mg in sodium chloride 0.9 % 50 mL IVPB  25 mg Intravenous Once Samella Parr, NP       Followed by  . iron dextran complex (INFED) 250 mg in sodium chloride 0.9 % 100 mL IVPB  250 mg Intravenous Once Samella Parr, NP      . morphine 2 MG/ML injection 1 mg  1 mg Intravenous Q2H PRN Samella Parr, NP      . ondansetron [facility]) tablet 4 mg  4 mg Oral Q6H PRN Samella Parr, NP  Or  . ondansetron (ZOFRAN) injection 4 mg  4 mg Intravenous Q6H PRN Samella Parr, NP      . oxyCODONE (Oxy IR/ROXICODONE) immediate release tablet 5 mg  5 mg Oral Q4H PRN Samella Parr, NP      . sodium chloride 0.9 % injection 3 mL  3 mL Intravenous Q12H Samella Parr, NP        Allergies as of 02/16/2015 - Review Complete 02/16/2015  Allergen Reaction Noted  . Codeine Other (See Comments) 02/16/2015    Review of Systems:    Positive for weakness, SOB. All systems reviewed and negative except where noted in HPI.    Physical Exam:  Vital signs in last 24 hours: Temp:  [98 F (36.7 C)-98.1 F (36.7 C)] 98.1 F (36.7 C) (05/16 1625) Pulse Rate:  [59-92] 65 (05/16 1625) Resp:  [12-22] 16 (05/16 1625) BP: (114-157)/(63-97) 148/89 mmHg (05/16 1625) SpO2:  [100 %] 100 % (05/16 1625) Weight:  [128 lb (58.06 kg)] 128 lb (58.06 kg) (05/16 1522)     General:   Pleasant emaciated white male in NAD Head:  Normocephalic and atraumatic. Eyes:   No icterus.   Conjunctiva pink. Ears:  Normal auditory acuity. Neck:  Supple; no masses felt Lungs:  Respirations even and unlabored. Lungs clear to auscultation bilaterally.   No wheezes, crackles, or rhonchi.  Heart:  Regular rate and rhythm;  murmur heard. Abdomen:  Soft, lower abdomen distended with tympany, RLQ tenderness.  Normal bowel sounds. No appreciable masses or hepatomegaly.  Rectal:  Not performed.  Msk:  Symmetrical without gross deformities.  Extremities:  Without edema. Neurologic:  Alert and  oriented x4;  grossly normal neurologically. Skin:  Intact without significant lesions or rashes. Cervical Nodes:  No significant cervical adenopathy. Psych:  Alert and cooperative. Normal affect.  LAB RESULTS:  Recent Labs  02/16/15 1133  WBC 8.3  HGB 6.7*  HCT 23.9*  PLT 1067*   BMET  Recent Labs  02/16/15 1133  NA 135  K 4.1  CL 100*  CO2 26  GLUCOSE 97  BUN 20  CREATININE 0.81  CALCIUM 8.9   LFT  Recent Labs  02/16/15 1133  PROT 6.3*  ALBUMIN 2.8*  AST 15  ALT 13*  ALKPHOS 106  BILITOT 0.3    STUDIES: Dg Chest 2 View  02/16/2015   CLINICAL DATA:  Fever for 3 days  EXAM: CHEST  2 VIEW  COMPARISON:  None.  FINDINGS: The heart size and mediastinal contours are within normal limits. Both lungs are clear. The visualized skeletal structures are unremarkable.  IMPRESSION: No active cardiopulmonary disease.   Electronically Signed   By: Inez Catalina M.D.   On: 02/16/2015 14:37   Ct Abdomen Pelvis W Contrast  02/16/2015   CLINICAL DATA:  Diarrhea for 2 months, abnormal hemoglobin levels, prior appendectomy, former smoker  EXAM: CT ABDOMEN AND PELVIS WITH CONTRAST  TECHNIQUE: Multidetector CT imaging of the abdomen and pelvis was performed using the standard protocol following bolus administration of intravenous contrast. Sagittal and coronal MPR images  reconstructed from axial data set.  CONTRAST:  66mL OMNIPAQUE IOHEXOL 300 MG/ML SOLN IV. Dilute oral contrast.  COMPARISON:  None  FINDINGS: Lung bases appear emphysematous but clear.  LEFT renal cyst 3.1 x 3.4 cm image 29.  Tiny nonobstructing calculus lower pole LEFT kidney image 29.  Kidneys, spleen, and adrenal glands normal.  Multiple low-attenuation lesions within the liver most consistent with for hepatic metastatic disease.  Two largest lesions are partially necrotic, 6.7 x 5.1 x 4.9 cm posterior RIGHT lobe image 14 and 2.6 x 2.4 x 2.6 cm anterior RIGHT lobe image 16.  Retrocaval adenopathy 14 mm short axis image 17.  Peripancreatic celiac node 12 mm short axis image 26.  Additionally, large soft tissue mass is seen within the mesentery beginning inferior to the pancreatic head and extending into the upper central pelvis measuring 4.7 x 4.5 x 8.8 cm likely confluent ileocolic adenopathy along the SMV.  This mass occludes the superior mesenteric vein, with portal and splenic veins remaining patent.  Thickening of cecum and terminal ileum highly suspicious for a cecal neoplasm.  Free fluid perihepatic, perisplenic, in RIGHT gutter, and in pelvis.  Scattered atherosclerotic calcifications.  Stomach and remaining bowel loops unremarkable.  Normal appearing bladder with prostatic enlargement.  No free air or osseous metastatic lesions.  IMPRESSION: Wall thickening of the cecum and terminal ileum high suspicious for a cecal neoplasm.  Extensive confluent ileocolic adenopathy extending cranially along the superior mesenteric vein with additional mildly enlarged peripancreatic nodes.  Multiple hepatic metastases and ascites.  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   Electronically Signed   By: Lavonia Dana M.D.   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 He will likely need biopsy of one of the large liver lesions or colonoscopy with cecal mass biopsies if absolutely necessary. Patient getting blood at present. Two sisters in the room with him.   Thanks   LOS: 0 days   Tye Savoy  02/16/2015, 4:38 PM  GI ATTENDING  History, laboratory, x-rays reviewed. Patient personally seen and examined. Agree with H&P as outlined above. 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 Confirmation could be made with liver biopsy. Not at all clear to me that colonoscopy is necessary, if liver biopsy confirms adenocarcinoma, given the CT appearance. Surgery would only be considered for palliation should he develop obstruction, which he does not currently. Poor prognosis. Fortunately, internal medicine attending is an oncologist. Please contact me for questions or problems. Discussed with patient and sisters Thank you  Docia Chuck. Geri [place]in[place]., M.D. [facility] Division of Gastroenterology

## 2015-02-17 ENCOUNTER — Other Ambulatory Visit (HOSPITAL_COMMUNITY): Payer: Medicare Other

## 2015-02-17 ENCOUNTER — Inpatient Hospital Stay (HOSPITAL_COMMUNITY): Payer: Medicare Other

## 2015-02-17 ENCOUNTER — Encounter (HOSPITAL_COMMUNITY): Payer: Self-pay | Admitting: Radiology

## 2015-02-17 DIAGNOSIS — E43 Unspecified severe protein-calorie malnutrition: Secondary | ICD-10-CM | POA: Insufficient documentation

## 2015-02-17 DIAGNOSIS — Z87891 Personal history of nicotine dependence: Secondary | ICD-10-CM

## 2015-02-17 DIAGNOSIS — E46 Unspecified protein-calorie malnutrition: Secondary | ICD-10-CM

## 2015-02-17 DIAGNOSIS — K769 Liver disease, unspecified: Secondary | ICD-10-CM

## 2015-02-17 DIAGNOSIS — R591 Generalized enlarged lymph nodes: Secondary | ICD-10-CM

## 2015-02-17 DIAGNOSIS — R634 Abnormal weight loss: Secondary | ICD-10-CM

## 2015-02-17 DIAGNOSIS — R5383 Other fatigue: Secondary | ICD-10-CM

## 2015-02-17 DIAGNOSIS — R0600 Dyspnea, unspecified: Secondary | ICD-10-CM

## 2015-02-17 DIAGNOSIS — D473 Essential (hemorrhagic) thrombocythemia: Secondary | ICD-10-CM

## 2015-02-17 DIAGNOSIS — C787 Secondary malignant neoplasm of liver and intrahepatic bile duct: Secondary | ICD-10-CM | POA: Insufficient documentation

## 2015-02-17 DIAGNOSIS — K529 Noninfective gastroenteritis and colitis, unspecified: Secondary | ICD-10-CM

## 2015-02-17 DIAGNOSIS — D508 Other iron deficiency anemias: Secondary | ICD-10-CM

## 2015-02-17 DIAGNOSIS — R197 Diarrhea, unspecified: Secondary | ICD-10-CM

## 2015-02-17 LAB — TYPE AND SCREEN
ABO/RH(D): O POS
Antibody Screen: NEGATIVE
Unit division: 0
Unit division: 0

## 2015-02-17 LAB — COMPREHENSIVE METABOLIC PANEL
ALT: 12 U/L — ABNORMAL LOW (ref 17–63)
AST: 16 U/L (ref 15–41)
Albumin: 2.9 g/dL — ABNORMAL LOW (ref 3.5–5.0)
Alkaline Phosphatase: 112 U/L (ref 38–126)
Anion gap: 11 (ref 5–15)
BUN: 14 mg/dL (ref 6–20)
CO2: 25 mmol/L (ref 22–32)
CREATININE: 0.86 mg/dL (ref 0.61–1.24)
Calcium: 9 mg/dL (ref 8.9–10.3)
Chloride: 100 mmol/L — ABNORMAL LOW (ref 101–111)
GLUCOSE: 90 mg/dL (ref 65–99)
Potassium: 3.8 mmol/L (ref 3.5–5.1)
SODIUM: 136 mmol/L (ref 135–145)
TOTAL PROTEIN: 6.2 g/dL — AB (ref 6.5–8.1)
Total Bilirubin: 0.8 mg/dL (ref 0.3–1.2)

## 2015-02-17 LAB — CBC
HEMATOCRIT: 32.2 % — AB (ref 39.0–52.0)
Hemoglobin: 9.6 g/dL — ABNORMAL LOW (ref 13.0–17.0)
MCH: 20.7 pg — ABNORMAL LOW (ref 26.0–34.0)
MCHC: 29.8 g/dL — ABNORMAL LOW (ref 30.0–36.0)
MCV: 69.5 fL — ABNORMAL LOW (ref 78.0–100.0)
PLATELETS: 959 10*3/uL — AB (ref 150–400)
RBC: 4.63 MIL/uL (ref 4.22–5.81)
RDW: 21.9 % — ABNORMAL HIGH (ref 11.5–15.5)
WBC: 10.7 10*3/uL — AB (ref 4.0–10.5)

## 2015-02-17 LAB — TISSUE TRANSGLUTAMINASE, IGA

## 2015-02-17 LAB — APTT: APTT: 36 s (ref 24–37)

## 2015-02-17 LAB — PROTIME-INR
INR: 1.08 (ref 0.00–1.49)
Prothrombin Time: 14.1 seconds (ref 11.6–15.2)

## 2015-02-17 LAB — TISSUE TRANSGLUTAMINASE, IGG: Tissue Transglut Ab: 1 U/mL (ref <6)

## 2015-02-17 LAB — PATHOLOGIST SMEAR REVIEW: Path Review: INCREASED

## 2015-02-17 LAB — RHEUMATOID FACTOR: Rhuematoid fact SerPl-aCnc: 7.1 IU/mL (ref 0.0–13.9)

## 2015-02-17 MED ORDER — HEPARIN SODIUM (PORCINE) 5000 UNIT/ML IJ SOLN
5000.0000 [IU] | Freq: Three times a day (TID) | INTRAMUSCULAR | Status: DC
  Administered 2015-02-19: 5000 [IU] via SUBCUTANEOUS
  Filled 2015-02-17 (×4): qty 1

## 2015-02-17 MED ORDER — HEPARIN SODIUM (PORCINE) 5000 UNIT/ML IJ SOLN
5000.0000 [IU] | Freq: Three times a day (TID) | INTRAMUSCULAR | Status: AC
  Administered 2015-02-17: 5000 [IU] via SUBCUTANEOUS

## 2015-02-17 MED ORDER — ENSURE ENLIVE PO LIQD
237.0000 mL | Freq: Three times a day (TID) | ORAL | Status: DC
  Administered 2015-02-17 – 2015-02-19 (×5): 237 mL via ORAL

## 2015-02-17 NOTE — Progress Notes (Signed)
[facility] PROGRESS NOTE   Ryan Cowan DJS:[id number] DOB: 12/06/1945 DOA: 02/16/2015 PCP: Leonard Downing, MD  HPI/Subjective: Feels much better after the transfusion, denies any new complaints.  Seen with sister at bedside. Questions answered.  Assessment/Plan: Principal Problem:   Symptomatic anemia Active Problems:   DOE (dyspnea on exertion)   Thrombocytosis   Chronic diarrhea   Weight loss, non-intentional   Former tobacco use   Hypoalbuminemia due to protein-calorie malnutrition   Protein-calorie malnutrition, severe    Anemia, severe, iron deficiency anemia, secondary to chronic blood loss Patient presented with classic symptoms of anemia with shortness of breath, malaise and easy fatigability. Hemoglobin is 6.7 on admission, increased to 9.6 after transfusion of 2 units of packed RBCs. MCV 64.9, iron is 8, TIBC 347 and ferritin of 15, consistent with iron deficiency anemia. This is likely secondary to occult bleeding from the ileocecal mass.  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 GI was consulted and recommended liver biopsy rather than ileocecal mass biopsy. I spoke with Dr. Griffith Citron of the [facility] [place], recommended to get liver biopsy, if patient does not have symptoms after the transfusion he can be discharged tomorrow. Oncology recommended for the patient follow-up with Dr. Benay Spice (colon cancer specialist) in 1 week after the biopsy results. The cancer center will follow-up with the patient for the appointment.  Liver metastases I spoke with Dr. Pascal Lux of the interventional radiology, patient will have liver biopsy on 02/18/2015. Keep nothing by mouth past midnight, hold subcutaneous heparin.  Protein calorie malnutrition severe Recent significant unintentional weight loss, albumin is 2.8. Likely secondary to the abdominal malignancy.   Code Status: Full Code Family Communication: Plan discussed with  the patient. Disposition Plan: Remains inpatient, likely can be discharged in a.m. after liver biopsy. Diet: Diet gluten free Room service appropriate?: Yes; Fluid consistency:: Thin Diet NPO time specified  Consultants:  None  Procedures:  None  Antibiotics:  None   Objective: Filed Vitals:   02/17/15 1012  BP: 101/54  Pulse: 78  Temp: 98.3 F (36.8 C)  Resp: 18    Intake/Output Summary (Last 24 hours) at 02/17/15 1558 Last data filed at 02/17/15 1446  Gross per 24 hour  Intake   1750 ml  Output    401 ml  Net   1349 ml   Filed Weights   02/16/15 1103 02/16/15 1522 02/16/15 2123  Weight: 58.06 kg (128 lb) 58.06 kg (128 lb) 55.384 kg (122 lb 1.6 oz)    Exam: General: Alert and awake, oriented x3, not in any acute distress. HEENT: anicteric sclera, pupils reactive to light and accommodation, EOMI CVS: S1-S2 clear, no murmur rubs or gallops Chest: clear to auscultation bilaterally, no wheezing, rales or rhonchi Abdomen: soft nontender, nondistended, normal bowel sounds, no organomegaly Extremities: no cyanosis, clubbing or edema noted bilaterally Neuro: Cranial nerves II-XII intact, no focal neurological deficits  Data Reviewed: Basic Metabolic Panel:  Recent Labs Lab 02/16/15 1133 02/17/15 0341  NA 135 136  K 4.1 3.8  CL 100* 100*  CO2 26 25  GLUCOSE 97 90  BUN 20 14  CREATININE 0.81 0.86  CALCIUM 8.9 9.0   Liver Function Tests:  Recent Labs Lab 02/16/15 1133 02/17/15 0341  AST 15 16  ALT 13* 12*  ALKPHOS 106 112  BILITOT 0.3 0.8  PROT 6.3* 6.2*  ALBUMIN 2.8* 2.9*    Recent Labs Lab 02/16/15 1133  LIPASE 35   No results for input(s): AMMONIA in  the last 168 hours. CBC:  Recent Labs Lab 02/16/15 1133 02/17/15 0341  WBC 8.3 10.7*  NEUTROABS 6.0  --   HGB 6.7* 9.6*  HCT 23.9* 32.2*  MCV 64.9* 69.5*  PLT 1067* 959*   Cardiac Enzymes: No results for input(s): CKTOTAL, CKMB, CKMBINDEX, TROPONINI in the last 168 hours. BNP  (last 3 results) No results for input(s): BNP in the last 8760 hours.  ProBNP (last 3 results) No results for input(s): PROBNP in the last 8760 hours.  CBG: No results for input(s): GLUCAP in the last 168 hours.  Micro No results found for this or any previous visit (from the past 240 hour(s)).   Studies: Dg Chest 2 View  02/16/2015   CLINICAL DATA:  Fever for 3 days  EXAM: CHEST  2 VIEW  COMPARISON:  None.  FINDINGS: The heart size and mediastinal contours are within normal limits. Both lungs are clear. The visualized skeletal structures are unremarkable.  IMPRESSION: No active cardiopulmonary disease.   Electronically Signed   By: Inez Catalina M.D.   On: 02/16/2015 14:37   Ct Abdomen Pelvis W Contrast  02/16/2015   CLINICAL DATA:  Diarrhea for 2 months, abnormal hemoglobin levels, prior appendectomy, former smoker  EXAM: CT ABDOMEN AND PELVIS WITH CONTRAST  TECHNIQUE: Multidetector CT imaging of the abdomen and pelvis was performed using the standard protocol following bolus administration of intravenous contrast. Sagittal and coronal MPR images reconstructed from axial data set.  CONTRAST:  50mL OMNIPAQUE IOHEXOL 300 MG/ML SOLN IV. Dilute oral contrast.  COMPARISON:  None  FINDINGS: Lung bases appear emphysematous but clear.  LEFT renal cyst 3.1 x 3.4 cm image 29.  Tiny nonobstructing calculus lower pole LEFT kidney image 29.  Kidneys, spleen, and adrenal glands normal.  Multiple low-attenuation lesions within the liver most consistent with for hepatic metastatic disease.  Two largest lesions are partially necrotic, 6.7 x 5.1 x 4.9 cm posterior RIGHT lobe image 14 and 2.6 x 2.4 x 2.6 cm anterior RIGHT lobe image 16.  Retrocaval adenopathy 14 mm short axis image 17.  Peripancreatic celiac node 12 mm short axis image 26.  Additionally, large soft tissue mass is seen within the mesentery beginning inferior to the pancreatic head and extending into the upper central pelvis measuring 4.7 x 4.5 x 8.8  cm likely confluent ileocolic adenopathy along the SMV.  This mass occludes the superior mesenteric vein, with portal and splenic veins remaining patent.  Thickening of cecum and terminal ileum highly suspicious for a cecal neoplasm.  Free fluid perihepatic, perisplenic, in RIGHT gutter, and in pelvis.  Scattered atherosclerotic calcifications.  Stomach and remaining bowel loops unremarkable.  Normal appearing bladder with prostatic enlargement.  No free air or osseous metastatic lesions.  IMPRESSION: Wall thickening of the cecum and terminal ileum high suspicious for a cecal neoplasm.  Extensive confluent ileocolic adenopathy extending cranially along the superior mesenteric vein with additional mildly enlarged peripancreatic nodes.  Multiple hepatic metastases and ascites.  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   Electronically Signed   By: Lavonia Dana M.D.   On: 02/16/2015 16:11    Scheduled Meds: . feeding supplement (ENSURE ENLIVE)  237 mL Oral TID BM  . heparin  5,000 Units Subcutaneous 3 times per day  . sodium chloride  3 mL Intravenous Q12H   Continuous Infusions:      Time spent: 35 minutes    [facility]  [facility] Pager [phone] If 7PM-7AM, please contact night-coverage at  [email or web address], password [facility] 02/17/2015, 3:58 PM  LOS: 1 day

## 2015-02-17 NOTE — Progress Notes (Signed)
Initial Nutrition Assessment  DOCUMENTATION CODES:  Severe malnutrition in context of acute illness/injury, Underweight   Pt meets criteria for SEVERE MALNUTRITION in the context of acute illness as evidenced by severe fat and muscle mass loss.  INTERVENTION:  Ensure Enlive (each supplement provides 350kcal and 20 grams of protein)   Encourage adequate PO intake.   NUTRITION DIAGNOSIS:  Malnutrition related to  (inadequate oral intake) as evidenced by severe depletion of body fat, severe depletion of muscle mass.  GOAL:  Patient will meet greater than or equal to 90% of their needs  MONITOR:  PO intake, Supplement acceptance, Weight trends, Labs, I & O's  REASON FOR ASSESSMENT:   (Low BMI)    ASSESSMENT: Pt with a two-month history of diarrhea, and 20 pound weight loss over the past year. Routine blood work showed hemoglobin 6.5. Workup in the emergency department revealed an iron deficiency anemia with iron saturation of 2% with associated thrombocytosis.  Pt reports his appetite has been fine currently and PTA. Current meal completion is 75%. Pt does complain of diarrhea which has been ongoing over the past 2 months that has been effecting his po intake. He reports no associated abdominal pains and unaware for the cause of his diarrhea. PTA pt reports eating multiple small meals throughout the day that consist of soft foods such as yogurt, canned vegetables, eggs, applesauce. Pt reports weight loss with usual body weight of 165 lbs which he reported to be 1 year ago. Noted pt with a 26.6% weight loss in 1 year. Pt is agreeable on trying Ensure to aid in caloric and protein needs. RD to order. Pt was encouraged to eat his food at meals.   Pt with severe fat and muscle mass loss.  Labs: Low chloride and ALT.  Height:  Ht Readings from Last 1 Encounters:  02/16/15 6\' 2"  (1.88 m)    Weight:  Wt Readings from Last 1 Encounters:  02/16/15 122 lb 1.6 oz (55.384 kg)     Ideal Body Weight:  86 kg  Wt Readings from Last 10 Encounters:  02/16/15 122 lb 1.6 oz (55.384 kg)    BMI:  Body mass index is 15.67 kg/(m^2). Underweight  Estimated Nutritional Needs:  Kcal:  1900-2100  Protein:  85-100 grams  Fluid:  1.9 - 2 .1 L/day  Skin:  Reviewed, no issues  Diet Order:  Diet gluten free Room service appropriate?: Yes; Fluid consistency:: Thin  EDUCATION NEEDS:  No education needs identified at this time   Intake/Output Summary (Last 24 hours) at 02/17/15 1444 Last data filed at 02/17/15 1115  Gross per 24 hour  Intake   1510 ml  Output    726 ml  Net    784 ml    Last BM:  PTA  Kallie Locks, MS, RD, LDN Pager # [phone] After hours/ weekend pager # [phone]

## 2015-02-17 NOTE — Consult Note (Signed)
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 Hartford Poli  HISTORY OF PRESENTING ILLNESS:  Ryan Cowan 69 y.o. male without significant past medical history, have not seen physician for several years, who presented with diarrhea, fatigue, dyspnea on exertion and anemia with hemoglobin 6.7. I was consulted for abnormal CT findings which is highly suspicious for metastatic colon cancer.  He has been having diarrhea, fatigue and dyspnea on exertion for the past 2-3 months. His diarrhea is watery stool, no hematochezia or melena. He denies significant abdominal pain, nausea, or change of his appetite. He lost about 20 pounds in the past 3 months. No fever, chills or night sweats. He has mild chronic dry cough which has not changed lately. He presented to his primary care physician last week, had lab work done, and was found to have hemoglobin 6.7. He was sent to emergency room yesterday and was admitted. CT of abdomen and pelvis showed a cecal mass, adenopathy, and multiple liver lesions, which is highly suspicious for metastatic colon cancer. He is scheduled for liver biopsy tomorrow by IR.   He is single, no children. He was independent, but not very physically active. He lives in a trailer. He has 3 sisters and parents live in [place] area. No family history of colon cancer or other malignancy.  MEDICAL HISTORY:  Past Medical History  Diagnosis Date  . Liver metastases     SURGICAL HISTORY: Past Surgical History  Procedure Laterality Date  . Shoulder surgery    . Knee surgery    . Appendectomy      SOCIAL HISTORY: History   Social History  . Marital Status: Single    Spouse Name: N/A  . Number of Children: N/A  . Years of  Education: N/A   Occupational History  . Not on file.   Social History Main Topics  . Smoking status: Former Smoker    Quit date: 10/04/2011  . Smokeless tobacco: Not on file  . Alcohol Use: No  . Drug Use: Not on file  . Sexual Activity: Not on file   Other Topics Concern  . Not on file   Social History Narrative    FAMILY HISTORY: History reviewed. No pertinent family history.  ALLERGIES:  is allergic to codeine.  MEDICATIONS:  Current Facility-Administered Medications  Medication Dose Route Frequency Provider Last Rate Last Dose  . acetaminophen (TYLENOL) tablet 650 mg  650 mg Oral Q6H PRN Samella Parr, NP       Or  . acetaminophen (TYLENOL) suppository 650 mg  650 mg Rectal Q6H PRN Samella Parr, NP      . alum & mag hydroxide-simeth (MAALOX/MYLANTA) 200-200-20 MG/5ML suspension 30 mL  30 mL Oral Q6H PRN Samella Parr, NP      . feeding supplement (ENSURE ENLIVE) (ENSURE ENLIVE) liquid 237 mL  237 mL Oral TID BM Kallie Locks, RD   237 mL at 02/17/15 1656  . heparin injection 5,000 Units  5,000 Units Subcutaneous 3 times per day Hedy Jacob, PA-C      . [START ON 02/19/2015] heparin injection 5,000 Units  5,000 Units Subcutaneous 3 times per day Wynell Balloon, RPH      . morphine 2 MG/ML injection 1 mg  1 mg Intravenous Q2H PRN Ebony Hail  Rosana Fret, NP   1 mg at 02/16/15 2037  . ondansetron (ZOFRAN) tablet 4 mg  4 mg Oral Q6H PRN Samella Parr, NP       Or  . ondansetron [facility]) injection 4 mg  4 mg Intravenous Q6H PRN Samella Parr, NP      . oxyCODONE (Oxy IR/ROXICODONE) immediate release tablet 5 mg  5 mg Oral Q4H PRN Samella Parr, NP   5 mg at 02/17/15 0509  . sodium chloride 0.9 % injection 3 mL  3 mL Intravenous Q12H Samella Parr, NP   3 mL at 02/17/15 1432    REVIEW OF SYSTEMS:   Constitutional: Denies fevers, chills or abnormal night sweats. (+) 20 lbs weight loss  Eyes: Denies blurriness of vision, double vision or watery eyes Ears, nose,  mouth, throat, and face: Denies mucositis or sore throat Respiratory: Denies cough, (+) dyspnea, no  wheezes Cardiovascular: Denies palpitation, chest discomfort or lower extremity swelling Gastrointestinal:  Denies nausea, heartburn, (+) watery diarrhea  Skin: Denies abnormal skin rashes Lymphatics: Denies new lymphadenopathy or easy bruising Neurological:Denies numbness, tingling or new weaknesses Behavioral/Psych: Mood is stable, no new changes  All other systems were reviewed with the patient and are negative.  PHYSICAL EXAMINATION: ECOG PERFORMANCE STATUS: 1 - Symptomatic but completely ambulatory  Filed Vitals:   02/17/15 1652  BP: 101/60  Pulse: 88  Temp: 98 F (36.7 C)  Resp: 18   Filed Weights   02/16/15 1103 02/16/15 1522 02/16/15 2123  Weight: 128 lb (58.06 kg) 128 lb (58.06 kg) 122 lb 1.6 oz (55.384 kg)    GENERAL:alert, no distress and comfortable SKIN: skin color, texture, turgor are normal, no rashes or significant lesions EYES: normal, conjunctiva are pink and non-injected, sclera clear OROPHARYNX:no exudate, no erythema and lips, buccal mucosa, and tongue normal  NECK: supple, thyroid normal size, non-tender, without nodularity LYMPH:  no palpable lymphadenopathy in the cervical, axillary or inguinal LUNGS: clear to auscultation and percussion with normal breathing effort HEART: regular rate & rhythm and no murmurs and no lower extremity edema ABDOMEN:abdomen soft, non-tender and normal bowel sounds Musculoskeletal:no cyanosis of digits and no clubbing  PSYCH: alert & oriented x 3 with fluent speech NEURO: no focal motor/sensory deficits  LABORATORY DATA:  I have reviewed the data as listed CBC Latest Ref Rng 02/17/2015 02/16/2015 12/23/2008  WBC 4.0 - 10.5 K/uL 10.7(H) 8.3 -  Hemoglobin 13.0 - 17.0 g/dL 9.6(L) 6.7(LL) 14.5  Hematocrit 39.0 - 52.0 % 32.2(L) 23.9(L) 43.3  Platelets 150 - 400 K/uL 959(HH) 1067(HH) -    CMP Latest Ref Rng 02/17/2015 02/16/2015   Glucose 65 - 99 mg/dL 90 97  BUN 6 - 20 mg/dL 14 20  Creatinine 0.61 - 1.24 mg/dL 0.86 0.81  Sodium 135 - 145 mmol/L 136 135  Potassium 3.5 - 5.1 mmol/L 3.8 4.1  Chloride 101 - 111 mmol/L 100(L) 100(L)  CO2 22 - 32 mmol/L 25 26  Calcium 8.9 - 10.3 mg/dL 9.0 8.9  Total Protein 6.5 - 8.1 g/dL 6.2(L) 6.3(L)  Total Bilirubin 0.3 - 1.2 mg/dL 0.8 0.3  Alkaline Phos 38 - 126 U/L 112 106  AST 15 - 41 U/L 16 15  ALT 17 - 63 U/L 12(L) 13(L)      RADIOGRAPHIC STUDIES: I have personally reviewed the radiological images as listed and agreed with the findings in the report. Dg Chest 2 View  02/16/2015   CLINICAL DATA:  Fever for 3 days  EXAM:  CHEST  2 VIEW  COMPARISON:  None.  FINDINGS: The heart size and mediastinal contours are within normal limits. Both lungs are clear. The visualized skeletal structures are unremarkable.  IMPRESSION: No active cardiopulmonary disease.   Electronically Signed   By: Inez Catalina M.D.   On: 02/16/2015 14:37   Ct Abdomen Pelvis W Contrast  02/16/2015   CLINICAL DATA:  Diarrhea for 2 months, abnormal hemoglobin levels, prior appendectomy, former smoker  EXAM: CT ABDOMEN AND PELVIS WITH CONTRAST  TECHNIQUE: Multidetector CT imaging of the abdomen and pelvis was performed using the standard protocol following bolus administration of intravenous contrast. Sagittal and coronal MPR images reconstructed from axial data set.  CONTRAST:  15m OMNIPAQUE IOHEXOL 300 MG/ML SOLN IV. Dilute oral contrast.  COMPARISON:  None  FINDINGS: Lung bases appear emphysematous but clear.  LEFT renal cyst 3.1 x 3.4 cm image 29.  Tiny nonobstructing calculus lower pole LEFT kidney image 29.  Kidneys, spleen, and adrenal glands normal.  Multiple low-attenuation lesions within the liver most consistent with for hepatic metastatic disease.  Two largest lesions are partially necrotic, 6.7 x 5.1 x 4.9 cm posterior RIGHT lobe image 14 and 2.6 x 2.4 x 2.6 cm anterior RIGHT lobe image 16.  Retrocaval  adenopathy 14 mm short axis image 17.  Peripancreatic celiac node 12 mm short axis image 26.  Additionally, large soft tissue mass is seen within the mesentery beginning inferior to the pancreatic head and extending into the upper central pelvis measuring 4.7 x 4.5 x 8.8 cm likely confluent ileocolic adenopathy along the SMV.  This mass occludes the superior mesenteric vein, with portal and splenic veins remaining patent.  Thickening of cecum and terminal ileum highly suspicious for a cecal neoplasm.  Free fluid perihepatic, perisplenic, in RIGHT gutter, and in pelvis.  Scattered atherosclerotic calcifications.  Stomach and remaining bowel loops unremarkable.  Normal appearing bladder with prostatic enlargement.  No free air or osseous metastatic lesions.  IMPRESSION: Wall thickening of the cecum and terminal ileum high suspicious for a cecal neoplasm.  Extensive confluent ileocolic adenopathy extending cranially along the superior mesenteric vein with additional mildly enlarged peripancreatic nodes.  Multiple hepatic metastases and ascites.  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   Electronically Signed   By: MLavonia DanaM.D.   On: 02/16/2015 16:11    ASSESSMENT & PLAN:  69year old Caucasian male, without significant past medical history, but has not seen a physician for several years, never had a colonoscopy, who presented with moderate anemia and diarrhea.  1. 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 He has significant worsening of the cecum and the terminal ileum, bulky abdominal adenopathy, and multiple large liver lesions which are highly suspicious for metastatic cancer. -He is going to have liver biopsy tomorrow -I'll test his tumor for MSI and KRAS/NRAS mutation if the pathology is consistent with colon primary -I discussed the staging with patient and his sister, unfortunately, his cancer is not curable at  this stage. -I briefly discussed chemotherapy treatment options for metastatic colon cancer, the benefit and risks. -I prefer him to have a port placement on Thursday when we have the biopsy results back. It is not feasible, I'll schedule as an outpatient. -Please check CEA  2. Anemia of iron deficiency, secondary to #1 and -He received a blood transfusion, keep Hb>8.0 -I agree with IV iron, prefer 1g, will repeat his iron study afterwards  3. Thrombocytosis, secondary to iron deficient anemia -Observation. He will likely resolve after iron placement. Risk of thrombosis is not very high.  Follow-up: I'll follow-up his biopsy results. I'll set up his follow-up appointment with me in our cancer center next week.   All questions were answered. The patient knows to call the clinic with any problems, questions or concerns. I spent 40 minutes counseling the patient face to face. The total time spent in the appointment was 55 minutes and more than 50% was on counseling.     [name], MD 02/17/2015 5:28 PM

## 2015-02-17 NOTE — Progress Notes (Signed)
  Echocardiogram 2D Echocardiogram has been performed.  Ryan Cowan 02/17/2015, 8:46 AM

## 2015-02-17 NOTE — H&P (Signed)
Reason for Consult: Hepatic lesions  Chief Complaint: Chief Complaint  Patient presents with  . Shortness of Breath  . GI Problem   Referring Physician(s): GI  History of Present Illness: Ryan Cowan is a 69 y.o. male who presented to ED after his doctor's office called him regarding recent lab work that revealed severe anemia. He presented to ED and received blood transfusion. He c/o 20 lb weight loss over past 3 months and loose stools during this time. He still has a good appetite. He denies any overt bleeding. He denies any chest pain, shortness of breath or palpitations. The patient denies any history of sleep apnea or chronic oxygen use. He has previously tolerated sedation without complications. CT done revealed findings suspicious for cecal neoplasm and multiple hepatic metastasis, IR received request for consult to obtain tissue diagnosis.   History reviewed. No pertinent past medical history.  Past Surgical History  Procedure Laterality Date  . Shoulder surgery    . Knee surgery    . Appendectomy      Allergies: Codeine  Medications: Prior to Admission medications   Medication Sig Start Date End Date Taking? Authorizing Provider  acetaminophen (TYLENOL) 500 MG tablet Take 500 mg by mouth every 6 (six) hours as needed (pain).   Yes Historical Provider, MD  aspirin EC 81 MG tablet Take 81 mg by mouth daily.   Yes Historical Provider, MD  diphenhydramine-acetaminophen (TYLENOL PM) 25-500 MG TABS Take 1 tablet by mouth at bedtime as needed (sleep).   Yes Historical Provider, MD  Multiple Vitamin (MULTIVITAMIN WITH MINERALS) TABS tablet Take 1 tablet by mouth daily. Centrum Silver 50 plus   Yes Historical Provider, MD  naproxen sodium (ANAPROX) 220 MG tablet Take 220 mg by mouth 2 (two) times daily as needed (pain).   Yes Historical Provider, MD  OVER THE COUNTER MEDICATION Place 1 application into both nostrils daily as needed (congestion). Vicks inhaler stick   Yes  Historical Provider, MD  phenylephrine-shark liver oil-mineral oil-petrolatum (PREPARATION H) 0.25-3-14-71.9 % rectal ointment Place 1 application rectally 3 (three) times daily as needed for hemorrhoids.   Yes Historical Provider, MD     History reviewed. No pertinent family history.  History   Social History  . Marital Status: Single    Spouse Name: N/A  . Number of Children: N/A  . Years of Education: N/A   Social History Main Topics  . Smoking status: Former Smoker    Quit date: 10/04/2011  . Smokeless tobacco: Not on file  . Alcohol Use: No  . Drug Use: Not on file  . Sexual Activity: Not on file   Other Topics Concern  . None   Social History Narrative    Review of Systems: A 12 point ROS discussed and pertinent positives are indicated in the HPI above.  All other systems are negative.  Review of Systems  Vital Signs: BP 101/54 mmHg  Pulse 78  Temp(Src) 98.3 F (36.8 C) (Oral)  Resp 18  Ht 6\' 2"  (1.88 m)  Wt 122 lb 1.6 oz (55.384 kg)  BMI 15.67 kg/m2  SpO2 98%  Physical Exam  Constitutional: He is oriented to person, place, and time. No distress.  HENT:  Head: Normocephalic and atraumatic.  Neck: No tracheal deviation present.  Cardiovascular: Normal rate and regular rhythm.  Exam reveals no gallop and no friction rub.   No murmur heard. Pulmonary/Chest: Effort normal and breath sounds normal. No respiratory distress. He has no wheezes. He has  no rales.  Abdominal: Soft. Bowel sounds are normal. He exhibits no distension. There is tenderness.  Neurological: He is alert and oriented to person, place, and time.  Skin: Skin is warm and dry. He is not diaphoretic.  Psychiatric: He has a normal mood and affect. His behavior is normal. Thought content normal.    Mallampati Score:  MD Evaluation Airway: WNL Heart: WNL Abdomen: WNL Chest/ Lungs: WNL ASA  Classification: 3 Mallampati/Airway Score: Two  Imaging: Dg Chest 2 View  02/16/2015   CLINICAL  DATA:  Fever for 3 days  EXAM: CHEST  2 VIEW  COMPARISON:  None.  FINDINGS: The heart size and mediastinal contours are within normal limits. Both lungs are clear. The visualized skeletal structures are unremarkable.  IMPRESSION: No active cardiopulmonary disease.   Electronically Signed   By: Inez Catalina M.D.   On: 02/16/2015 14:37   Ct Abdomen Pelvis W Contrast  02/16/2015   CLINICAL DATA:  Diarrhea for 2 months, abnormal hemoglobin levels, prior appendectomy, former smoker  EXAM: CT ABDOMEN AND PELVIS WITH CONTRAST  TECHNIQUE: Multidetector CT imaging of the abdomen and pelvis was performed using the standard protocol following bolus administration of intravenous contrast. Sagittal and coronal MPR images reconstructed from axial data set.  CONTRAST:  24mL OMNIPAQUE IOHEXOL 300 MG/ML SOLN IV. Dilute oral contrast.  COMPARISON:  None  FINDINGS: Lung bases appear emphysematous but clear.  LEFT renal cyst 3.1 x 3.4 cm image 29.  Tiny nonobstructing calculus lower pole LEFT kidney image 29.  Kidneys, spleen, and adrenal glands normal.  Multiple low-attenuation lesions within the liver most consistent with for hepatic metastatic disease.  Two largest lesions are partially necrotic, 6.7 x 5.1 x 4.9 cm posterior RIGHT lobe image 14 and 2.6 x 2.4 x 2.6 cm anterior RIGHT lobe image 16.  Retrocaval adenopathy 14 mm short axis image 17.  Peripancreatic celiac node 12 mm short axis image 26.  Additionally, large soft tissue mass is seen within the mesentery beginning inferior to the pancreatic head and extending into the upper central pelvis measuring 4.7 x 4.5 x 8.8 cm likely confluent ileocolic adenopathy along the SMV.  This mass occludes the superior mesenteric vein, with portal and splenic veins remaining patent.  Thickening of cecum and terminal ileum highly suspicious for a cecal neoplasm.  Free fluid perihepatic, perisplenic, in RIGHT gutter, and in pelvis.  Scattered atherosclerotic calcifications.  Stomach and  remaining bowel loops unremarkable.  Normal appearing bladder with prostatic enlargement.  No free air or osseous metastatic lesions.  IMPRESSION: Wall thickening of the cecum and terminal ileum high suspicious for a cecal neoplasm.  Extensive confluent ileocolic adenopathy extending cranially along the superior mesenteric vein with additional mildly enlarged peripancreatic nodes.  Multiple hepatic metastases and ascites.  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   Electronically Signed   By: Lavonia Dana M.D.   On: 02/16/2015 16:11    Labs:  CBC:  Recent Labs  02/16/15 1133 02/17/15 0341  WBC 8.3 10.7*  HGB 6.7* 9.6*  HCT 23.9* 32.2*  PLT 1067* 959*    COAGS:  Recent Labs  02/17/15 0341  INR 1.08  APTT 36    BMP:  Recent Labs  02/16/15 1133 02/17/15 0341  NA 135 136  K 4.1 3.8  CL 100* 100*  CO2 26 25  GLUCOSE 97 90  BUN 20 14  CALCIUM 8.9 9.0  CREATININE 0.81 0.86  GFRNONAA >60 >60  GFRAA >60 >60  LIVER FUNCTION TESTS:  Recent Labs  02/16/15 1133 02/17/15 0341  BILITOT 0.3 0.8  AST 15 16  ALT 13* 12*  ALKPHOS 106 112  PROT 6.3* 6.2*  ALBUMIN 2.8* 2.9*    Assessment and Plan: Loose stools Weight loss Anemia, s/p transfusion  CT with suspicion for cecal neoplasm and multiple hepatic metastasis and ascites seen Request for IR consult The patient has been NPO, no blood thinners taken, labs and vitals have been reviewed. Risks and Benefits discussed with the patient including, but not limited to bleeding, infection, damage to adjacent structures or low yield requiring additional tests. All of the patient's questions were answered, patient is agreeable to proceed. Consent signed and in chart.   Thank you for this interesting consult.  I greatly enjoyed meeting Dawsyn P Balthazor and look forward to participating in their care.  SignedHedy Jacob 02/17/2015, 4:42 PM   I spent a total of 40 Minutes in face to face in  clinical consultation, greater than 50% of which was counseling/coordinating care for liver lesions.

## 2015-02-18 ENCOUNTER — Inpatient Hospital Stay (HOSPITAL_COMMUNITY): Payer: Medicare Other

## 2015-02-18 DIAGNOSIS — D649 Anemia, unspecified: Secondary | ICD-10-CM

## 2015-02-18 DIAGNOSIS — E43 Unspecified severe protein-calorie malnutrition: Secondary | ICD-10-CM

## 2015-02-18 DIAGNOSIS — C801 Malignant (primary) neoplasm, unspecified: Secondary | ICD-10-CM

## 2015-02-18 DIAGNOSIS — C787 Secondary malignant neoplasm of liver and intrahepatic bile duct: Secondary | ICD-10-CM

## 2015-02-18 DIAGNOSIS — R0609 Other forms of dyspnea: Secondary | ICD-10-CM

## 2015-02-18 MED ORDER — FENTANYL CITRATE (PF) 100 MCG/2ML IJ SOLN
INTRAMUSCULAR | Status: AC | PRN
  Administered 2015-02-18 (×2): 50 ug via INTRAVENOUS

## 2015-02-18 MED ORDER — LIDOCAINE-EPINEPHRINE 1 %-1:100000 IJ SOLN
INTRAMUSCULAR | Status: AC
  Filled 2015-02-18: qty 1

## 2015-02-18 MED ORDER — FENTANYL CITRATE (PF) 100 MCG/2ML IJ SOLN
INTRAMUSCULAR | Status: AC
  Filled 2015-02-18: qty 2

## 2015-02-18 MED ORDER — MIDAZOLAM HCL 2 MG/2ML IJ SOLN
INTRAMUSCULAR | Status: AC | PRN
  Administered 2015-02-18 (×2): 1 mg via INTRAVENOUS

## 2015-02-18 MED ORDER — GELATIN ABSORBABLE 12-7 MM EX MISC
CUTANEOUS | Status: AC
  Filled 2015-02-18: qty 1

## 2015-02-18 MED ORDER — ENSURE ENLIVE PO LIQD: 237.0000 mL | Freq: Three times a day (TID) | ORAL | Status: DC

## 2015-02-18 MED ORDER — MIDAZOLAM HCL 2 MG/2ML IJ SOLN
INTRAMUSCULAR | Status: AC
  Filled 2015-02-18: qty 2

## 2015-02-18 NOTE — Sedation Documentation (Signed)
Pt eyes closing.

## 2015-02-18 NOTE — Sedation Documentation (Signed)
Dr. Pascal Lux obtaining samples, pt tolerating well.

## 2015-02-18 NOTE — Procedures (Signed)
Technically successful US guided biopsy of indeterminate lesion in the left lobe of the liver.  No immediate complications.

## 2015-02-18 NOTE — Sedation Documentation (Signed)
Patient denies pain and is resting comfortably.  

## 2015-02-18 NOTE — Progress Notes (Signed)
[facility] PROGRESS NOTE  Ryan Cowan WUG:[id number] DOB: 1946/02/24 DOA: 02/16/2015 PCP: Leonard Downing, MD  Assessment/Plan: 69 y/o male with h/o tobacco use presented with ongoing intermittent diarrhea, associated with DOE, weight loss and found to have anemia with Hg at 6.7. CT abdomen showed metastatic lesion.  -admitted with acute, symptomatic anemia, metastatic cancer   1. Symptomatic anemia, severe, iron deficiency anemia, secondary to chronic blood loss. Hemoglobin is 6.7 on admission, increased to 9.6 after transfusion of 2 units of packed RBCs.  2. 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 Ileocecal mass, liver mets. GI was consulted and recommended liver biopsy rather than ileocecal mass biopsy. -awaiting IR liver biopsy. needd close f/u with oncology.  appreciate oncology eval. Dr. Burr Medico will f/u at cancer center  3. Protein calorie malnutrition severe. Recent significant unintentional weight loss, albumin is 2.8. Likely secondary to the abdominal malignancy.  4. Thrombocytosis ? Due to IDA vs cancer related.   D/c plans possible 24-48 hrs    Code Status: full Family Communication: d/w patient, his sisters  (indicate person spoken with, relationship, and if by phone, the number) Disposition Plan: home 24-48 hrs    Consultants:  Oncology, GI  Procedures:  Pend IR biopsy   Antibiotics:  none (indicate start date, and stop date if known)  HPI/Subjective: Alert, oriented   Objective: Filed Vitals:   02/18/15 0830  BP: 110/64  Pulse: 72  Temp: 97.9 F (36.6 C)  Resp: 18    Intake/Output Summary (Last 24 hours) at 02/18/15 1030 Last data filed at 02/18/15 0830  Gross per 24 hour  Intake    620 ml  Output      3 ml  Net    617 ml   Filed Weights   02/16/15 1522 02/16/15 2123 02/17/15 2128  Weight: 58.06 kg (128 lb) 55.384 kg (122 lb 1.6 oz) 56.473 kg (124 lb 8 oz)    Exam:   General:  No distress   Cardiovascular: s1,s2 rrr  Respiratory:  CTA BL  Abdomen: soft, nt,nd   Musculoskeletal: no leg edema   Data Reviewed: Basic Metabolic Panel:  Recent Labs Lab 02/16/15 1133 02/17/15 0341  NA 135 136  K 4.1 3.8  CL 100* 100*  CO2 26 25  GLUCOSE 97 90  BUN 20 14  CREATININE 0.81 0.86  CALCIUM 8.9 9.0   Liver Function Tests:  Recent Labs Lab 02/16/15 1133 02/17/15 0341  AST 15 16  ALT 13* 12*  ALKPHOS 106 112  BILITOT 0.3 0.8  PROT 6.3* 6.2*  ALBUMIN 2.8* 2.9*    Recent Labs Lab 02/16/15 1133  LIPASE 35   No results for input(s): AMMONIA in the last 168 hours. CBC:  Recent Labs Lab 02/16/15 1133 02/17/15 0341  WBC 8.3 10.7*  NEUTROABS 6.0  --   HGB 6.7* 9.6*  HCT 23.9* 32.2*  MCV 64.9* 69.5*  PLT 1067* 959*   Cardiac Enzymes: No results for input(s): CKTOTAL, CKMB, CKMBINDEX, TROPONINI in the last 168 hours. BNP (last 3 results) No results for input(s): BNP in the last 8760 hours.  ProBNP (last 3 results) No results for input(s): PROBNP in the last 8760 hours.  CBG: No results for input(s): GLUCAP in the last 168 hours.  No results found for this or any previous visit (from the past 240 hour(s)).   Studies: Dg Chest 2 View  02/16/2015   CLINICAL DATA:  Fever for 3 days  EXAM: CHEST  2 VIEW  COMPARISON:  None.  FINDINGS:  The heart size and mediastinal contours are within normal limits. Both lungs are clear. The visualized skeletal structures are unremarkable.  IMPRESSION: No active cardiopulmonary disease.   Electronically Signed   By: Inez Catalina M.D.   On: 02/16/2015 14:37   Ct Abdomen Pelvis W Contrast  02/16/2015   CLINICAL DATA:  Diarrhea for 2 months, abnormal hemoglobin levels, prior appendectomy, former smoker  EXAM: CT ABDOMEN AND PELVIS WITH CONTRAST  TECHNIQUE: Multidetector CT imaging of the abdomen and pelvis was performed using the standard protocol following bolus administration of intravenous contrast. Sagittal and coronal MPR images reconstructed from axial data set.   CONTRAST:  44mL OMNIPAQUE IOHEXOL 300 MG/ML SOLN IV. Dilute oral contrast.  COMPARISON:  None  FINDINGS: Lung bases appear emphysematous but clear.  LEFT renal cyst 3.1 x 3.4 cm image 29.  Tiny nonobstructing calculus lower pole LEFT kidney image 29.  Kidneys, spleen, and adrenal glands normal.  Multiple low-attenuation lesions within the liver most consistent with for hepatic metastatic disease.  Two largest lesions are partially necrotic, 6.7 x 5.1 x 4.9 cm posterior RIGHT lobe image 14 and 2.6 x 2.4 x 2.6 cm anterior RIGHT lobe image 16.  Retrocaval adenopathy 14 mm short axis image 17.  Peripancreatic celiac node 12 mm short axis image 26.  Additionally, large soft tissue mass is seen within the mesentery beginning inferior to the pancreatic head and extending into the upper central pelvis measuring 4.7 x 4.5 x 8.8 cm likely confluent ileocolic adenopathy along the SMV.  This mass occludes the superior mesenteric vein, with portal and splenic veins remaining patent.  Thickening of cecum and terminal ileum highly suspicious for a cecal neoplasm.  Free fluid perihepatic, perisplenic, in RIGHT gutter, and in pelvis.  Scattered atherosclerotic calcifications.  Stomach and remaining bowel loops unremarkable.  Normal appearing bladder with prostatic enlargement.  No free air or osseous metastatic lesions.  IMPRESSION: Wall thickening of the cecum and terminal ileum high suspicious for a cecal neoplasm.  Extensive confluent ileocolic adenopathy extending cranially along the superior mesenteric vein with additional mildly enlarged peripancreatic nodes.  Multiple hepatic metastases and ascites.  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   Electronically Signed   By: Lavonia Dana M.D.   On: 02/16/2015 16:11    Scheduled Meds: . feeding supplement (ENSURE ENLIVE)  237 mL Oral TID BM  . [START ON 02/19/2015] heparin subcutaneous  5,000 Units Subcutaneous 3 times per day  . sodium chloride  3 mL  Intravenous Q12H   Continuous Infusions:   Principal Problem:   Symptomatic anemia Active Problems:   DOE (dyspnea on exertion)   Thrombocytosis   Chronic diarrhea   Weight loss, non-intentional   Former tobacco use   Hypoalbuminemia due to protein-calorie malnutrition   Protein-calorie malnutrition, severe   Liver metastases    Time spent: >35 minutes     Kinnie Feil  [facility] Pager [phone]. If 7PM-7AM, please contact night-coverage at [email or web address], password [facility] 02/18/2015, 10:30 AM  LOS: 2 days

## 2015-02-18 NOTE — Sedation Documentation (Signed)
Pt c/o pain w/ movement of needle to obtain sample.

## 2015-02-19 LAB — CEA: CEA: 2.7 ng/mL (ref 0.0–4.7)

## 2015-02-19 NOTE — Discharge Summary (Signed)
Physician Discharge Summary  Ryan Cowan GNF:[id number] DOB: 27-Dec-1945 DOA: 02/16/2015  PCP: Leonard Downing, MD  Admit date: 02/16/2015 Discharge date: 02/19/2015  Time spent: >35 minutes  Recommendations for Outpatient Follow-up:  F/u with oncology next week  F/u with PCP in 3-7 days as needed  Discharge Diagnoses:  Principal Problem:   Symptomatic anemia Active Problems:   DOE (dyspnea on exertion)   Thrombocytosis   Chronic diarrhea   Weight loss, non-intentional   Former tobacco use   Hypoalbuminemia due to protein-calorie malnutrition   Protein-calorie malnutrition, severe   Liver metastases   Discharge Condition: stable   Diet recommendation: regular   Filed Weights   02/16/15 1522 02/16/15 2123 02/17/15 2128  Weight: 58.06 kg (128 lb) 55.384 kg (122 lb 1.6 oz) 56.473 kg (124 lb 8 oz)    History of present illness:  69 y/o male with h/o tobacco use presented with ongoing intermittent diarrhea, associated with DOE, weight loss and found to have anemia with Hg at 6.7. CT abdomen showed metastatic lesion.  -admitted with acute, symptomatic anemia, metastatic cancer   Hospital Course:  1. Symptomatic anemia, severe, iron deficiency anemia, secondary to chronic blood loss. Hemoglobin is 6.7 on admission, increased to 9.6 after transfusion of 2 units of packed RBCs. No s/s of acute bleeding. Recommended to repeat CBC next week. Tf prn  2. 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 Ileocecal mass, liver mets. GI was consulted and recommended liver biopsy rather than ileocecal mass biopsy. -Patient underwent US liver biopsy on 5/18. Pend pathology. D/w patient, he will f/u with Dr. Burr Medico will f/u at cancer center  Next week to discuss treatment options  3. Protein calorie malnutrition severe. Recent significant unintentional weight loss, albumin is 2.8. Likely secondary to the abdominal malignancy.  4. Thrombocytosis ? Due to IDA vs cancer related.   D/w patient, his sisters  at the bedside. they will f/u with path results next week with oncology      Procedures:  US liver biopsy  (i.e. Studies not automatically included, echos, thoracentesis, etc; not x-rays)  Consultations:  GI  Oncology  IR  Discharge Exam: Filed Vitals:   02/19/15 0940  BP: 105/67  Pulse: 71  Temp: 97.3 F (36.3 C)  Resp: 18    General: alert, no distress  Cardiovascular: s1,s2 rrr Respiratory: CTA BL  Discharge Instructions  Discharge Instructions    Diet - low sodium heart healthy    Complete by:  As directed      Discharge instructions    Complete by:  As directed   Please follow up with oncology next week     Increase activity slowly    Complete by:  As directed             Medication List    STOP taking these medications        diphenhydramine-acetaminophen 25-500 MG Tabs  Commonly known as:  TYLENOL PM      TAKE these medications        acetaminophen 500 MG tablet  Commonly known as:  TYLENOL  Take 500 mg by mouth every 6 (six) hours as needed (pain).     aspirin EC 81 MG tablet  Take 81 mg by mouth daily.     feeding supplement (ENSURE ENLIVE) Liqd  Take 237 mLs by mouth 3 (three) times daily between meals.     multivitamin with minerals Tabs tablet  Take 1 tablet by mouth daily. Centrum Silver 50 plus  naproxen sodium 220 MG tablet  Commonly known as:  ANAPROX  Take 220 mg by mouth 2 (two) times daily as needed (pain).     OVER THE COUNTER MEDICATION  Place 1 application into both nostrils daily as needed (congestion). Vicks inhaler stick     phenylephrine-shark liver oil-mineral oil-petrolatum 0.25-3-14-71.9 % rectal ointment  Commonly known as:  PREPARATION H  Place 1 application rectally 3 (three) times daily as needed for hemorrhoids.       Allergies  Allergen Reactions  . Codeine Other (See Comments)    hallucinations       Follow-up Information    Follow up with [name], MD.   Specialties:  Hematology,  Oncology   Contact information:   [place] [id number] [phone]       Follow up with Leonard Downing, MD.   Specialty:  [facility]   Contact information:   [place] [id number] [phone]        The results of significant diagnostics from this hospitalization (including imaging, microbiology, ancillary and laboratory) are listed below for reference.    Significant Diagnostic Studies: Dg Chest 2 View  02/16/2015   CLINICAL DATA:  Fever for 3 days  EXAM: CHEST  2 VIEW  COMPARISON:  None.  FINDINGS: The heart size and mediastinal contours are within normal limits. Both lungs are clear. The visualized skeletal structures are unremarkable.  IMPRESSION: No active cardiopulmonary disease.   Electronically Signed   By: Inez Catalina M.D.   On: 02/16/2015 14:37   Ct Abdomen Pelvis W Contrast  02/16/2015   CLINICAL DATA:  Diarrhea for 2 months, abnormal hemoglobin levels, prior appendectomy, former smoker  EXAM: CT ABDOMEN AND PELVIS WITH CONTRAST  TECHNIQUE: Multidetector CT imaging of the abdomen and pelvis was performed using the standard protocol following bolus administration of intravenous contrast. Sagittal and coronal MPR images reconstructed from axial data set.  CONTRAST:  20mL OMNIPAQUE IOHEXOL 300 MG/ML SOLN IV. Dilute oral contrast.  COMPARISON:  None  FINDINGS: Lung bases appear emphysematous but clear.  LEFT renal cyst 3.1 x 3.4 cm image 29.  Tiny nonobstructing calculus lower pole LEFT kidney image 29.  Kidneys, spleen, and adrenal glands normal.  Multiple low-attenuation lesions within the liver most consistent with for hepatic metastatic disease.  Two largest lesions are partially necrotic, 6.7 x 5.1 x 4.9 cm posterior RIGHT lobe image 14 and 2.6 x 2.4 x 2.6 cm anterior RIGHT lobe image 16.  Retrocaval adenopathy 14 mm short axis image 17.  Peripancreatic celiac node 12 mm short axis image 26.  Additionally, large soft tissue mass is  seen within the mesentery beginning inferior to the pancreatic head and extending into the upper central pelvis measuring 4.7 x 4.5 x 8.8 cm likely confluent ileocolic adenopathy along the SMV.  This mass occludes the superior mesenteric vein, with portal and splenic veins remaining patent.  Thickening of cecum and terminal ileum highly suspicious for a cecal neoplasm.  Free fluid perihepatic, perisplenic, in RIGHT gutter, and in pelvis.  Scattered atherosclerotic calcifications.  Stomach and remaining bowel loops unremarkable.  Normal appearing bladder with prostatic enlargement.  No free air or osseous metastatic lesions.  IMPRESSION: Wall thickening of the cecum and terminal ileum high suspicious for a cecal neoplasm.  Extensive confluent ileocolic adenopathy extending cranially along the superior mesenteric vein with additional mildly enlarged peripancreatic nodes.  Multiple hepatic metastases and ascites.  SMV occlusion secondary to the observed  ileocolic adenopathy.   Electronically Signed   By: Lavonia Dana M.D.   On: 02/16/2015 16:11   US Biopsy  02/18/2015   INDICATION: Concern for colon cancer, now with multiple liver lesions worrisome for metastatic disease. Please perform ultrasound-guided liver lesion biopsy for tissue diagnostic purposes  EXAM: ULTRASOUND GUIDED LIVER LESION BIOPSY  COMPARISON:  CT abdomen pelvis - 02/16/2015  MEDICATIONS: Fentanyl 100 mcg IV; Versed 2 mg IV  ANESTHESIA/SEDATION: Total Moderate Sedation time  16 minutes  COMPLICATIONS: None immediate  PROCEDURE: Informed written consent was obtained from the patient after a discussion of the risks, benefits and alternatives to treatment. The patient understands and consents the procedure. A timeout was performed prior to the initiation of the procedure.  Ultrasound scanning was performed of the right upper abdominal quadrant demonstrates multiple ill-defined mixed echogenic lesions compatible with the findings on preceding abdominal  CT. To given patient's cachexia, intercostal imaging proved difficult secondary to small intercostal windows. While a dominant lesion was seen within the caudal aspect of the left lobe of the liver, this lesion was not targeted for biopsy secondary to the necessary marked cranial angulation of the ultrasound probe.  As such, a smaller approximately 1.0 x 1.0 cm lesion within the subcapsular aspect of the left lobe of the liver likely correlating with the lesion seen on preceding abdominal CT image 23, series 21) was targeted for biopsy. The procedure was planned.  The midline of the abdomen was prepped and draped in the usual sterile fashion. The overlying soft tissues were anesthetized with 1% lidocaine with epinephrine. A 17 gauge, 6.8 cm co-axial needle was advanced into a peripheral aspect of the lesion. This was followed by 3 core biopsies with an 18 gauge core device under direct ultrasound guidance.  The co-axial needle was removed and hemostasis was obtained with manual compression. Post procedural scanning was negative for definitive area of hemorrhage or additional complication. A dressing was placed. The patient tolerated the procedure well without immediate post procedural complication.  IMPRESSION: Technically successful ultrasound guided core needle biopsy of ill-defined subcapsular lesion within the left lobe of the liver.   Electronically Signed   By: Sandi Mariscal M.D.   On: 02/18/2015 14:51    Microbiology: No results found for this or any previous visit (from the past 240 hour(s)).   Labs: Basic Metabolic Panel:  Recent Labs Lab 02/16/15 1133 02/17/15 0341  NA 135 136  K 4.1 3.8  CL 100* 100*  CO2 26 25  GLUCOSE 97 90  BUN 20 14  CREATININE 0.81 0.86  CALCIUM 8.9 9.0   Liver Function Tests:  Recent Labs Lab 02/16/15 1133 02/17/15 0341  AST 15 16  ALT 13* 12*  ALKPHOS 106 112  BILITOT 0.3 0.8  PROT 6.3* 6.2*  ALBUMIN 2.8* 2.9*    Recent Labs Lab 02/16/15 1133   LIPASE 35   No results for input(s): AMMONIA in the last 168 hours. CBC:  Recent Labs Lab 02/16/15 1133 02/17/15 0341  WBC 8.3 10.7*  NEUTROABS 6.0  --   HGB 6.7* 9.6*  HCT 23.9* 32.2*  MCV 64.9* 69.5*  PLT 1067* 959*   Cardiac Enzymes: No results for input(s): CKTOTAL, CKMB, CKMBINDEX, TROPONINI in the last 168 hours. BNP: BNP (last 3 results) No results for input(s): BNP in the last 8760 hours.  ProBNP (last 3 results) No results for input(s): PROBNP in the last 8760 hours.  CBG: No results for input(s): GLUCAP in the last 168 hours.  SignedKinnie Feil  [facility] 02/19/2015, 10:33 AM

## 2015-02-19 NOTE — Progress Notes (Signed)
Chaplain Note:   Chaplain completed AD with Chaplain and shared extensive conversation.   His two sisters were present the 65 of the time and his brother in law came during the tail end.   Mr. Ocain spoke significantly about his religious experience and insights he's gained through life.   5 copies of AD plus the original handed to him.   Copy also given to front desk for placement in file.   Delford Field, Chaplain 02/19/2015

## 2015-02-19 NOTE — Progress Notes (Signed)
Discharge instruction and d/c papers given to patient.Patient reminded to call MD for follow up and to pick up ensure at his [facility] pharmacy.Patient verbalized understanding.Family at bedside. Pami Wool, Wonda Cheng, Therapist, sports

## 2015-02-20 ENCOUNTER — Telehealth: Payer: Self-pay | Admitting: Hematology

## 2015-02-20 LAB — FECAL LACTOFERRIN, QUANT: Fecal Lactoferrin: NEGATIVE

## 2015-02-20 NOTE — Telephone Encounter (Signed)
new patient appt-left mesage for patient to return call

## 2015-02-20 NOTE — Telephone Encounter (Signed)
pt wife called to r/s appt .Marland Kitchen...transferred to new pt sched

## 2015-02-27 ENCOUNTER — Ambulatory Visit (HOSPITAL_BASED_OUTPATIENT_CLINIC_OR_DEPARTMENT_OTHER): Payer: Medicare Other

## 2015-02-27 ENCOUNTER — Encounter: Payer: Self-pay | Admitting: Hematology

## 2015-02-27 ENCOUNTER — Encounter: Payer: Self-pay | Admitting: *Deleted

## 2015-02-27 ENCOUNTER — Ambulatory Visit (HOSPITAL_BASED_OUTPATIENT_CLINIC_OR_DEPARTMENT_OTHER): Payer: Medicare Other | Admitting: Hematology

## 2015-02-27 VITALS — BP 135/77 | HR 62 | Temp 97.8°F | Resp 18 | Ht 74.0 in | Wt 138.0 lb

## 2015-02-27 DIAGNOSIS — C7B09 Secondary carcinoid tumors of other sites: Secondary | ICD-10-CM | POA: Diagnosis not present

## 2015-02-27 DIAGNOSIS — C7B02 Secondary carcinoid tumors of liver: Secondary | ICD-10-CM

## 2015-02-27 DIAGNOSIS — C7B00 Secondary carcinoid tumors, unspecified site: Secondary | ICD-10-CM

## 2015-02-27 DIAGNOSIS — D500 Iron deficiency anemia secondary to blood loss (chronic): Secondary | ICD-10-CM

## 2015-02-27 DIAGNOSIS — C7A021 Malignant carcinoid tumor of the cecum: Secondary | ICD-10-CM | POA: Diagnosis not present

## 2015-02-27 DIAGNOSIS — C787 Secondary malignant neoplasm of liver and intrahepatic bile duct: Secondary | ICD-10-CM

## 2015-02-27 NOTE — [facility] Oncology Navigator Note (Signed)
Oncology Nurse Navigator Documentation  Oncology Nurse Navigator Flowsheets 02/27/2015  Navigator Encounter Type Initial MedOnc-New patient appointment  Patient Visit Type Medonc--Hospital follow up  Treatment Phase Treatment planning-Sandostatin, IV iron planned.  Met with patient and sisters during new patient visit. Explained the role of the GI Nurse Navigator and provided New Patient Packet with information on: 1.  Carcinoid cancer 2. Support groups 3. Fall Safety Plan Answered questions, reviewed current treatment plan using TEACH back and provided emotional support. Provided copy of current treatment plan. Reviewed instructions on 24 hour urine collection for HIAA, discussed s/s bowel obstruction and when to call and to avoid NSAIDs   Barriers/Navigation Needs Family concerns;Education-nutrition, lives alone. Made referral to nutrition and social worker  Support Groups/Services GI  Time Spent with Patient 30

## 2015-02-27 NOTE — Progress Notes (Signed)
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 male is here because of recently diagnosed metastatic carcinoid tumor. I saw him in the hospital initially, this is his first follow-up after his hospital discharge. He presents to the clinic with his 3 sisters.  He has been having diarrhea, fatigue and dyspnea on exertion for the past 2-3 months. His diarrhea is watery stool, no hematochezia or melena. He denies significant abdominal pain, nausea, or change of his appetite. He lost about 20 pounds in the past 3 months. No fever, chills or night sweats. He has mild chronic dry cough which has not changed lately. He presented to his primary care physician 2-3 weeks ago, had lab work done, and was found to have hemoglobin 6.7. He was sent to emergency room on 02/16/2015 and was admitted. CT of abdomen and pelvis showed a cecal mass, adenopathy, and multiple liver lesions, which is highly suspicious for metastatic colon cancer. He received 2 units of blood transfusion, IV dextran 250 mg, and underwent liver biopsy. He was discharged home after the biopsy.  He is single, no children. He was independent, but not very physically active. He lives in a trailer. He has 3 sisters and parents live in [place] area. No family history of colon cancer or other malignancy. He moved to his mother's house, and is now very close to his sisters. His mom lives in a nursing home.  He feels better lately, gained some weight. His diarrhea is unchanged. He has mild intermittent low abdominal pain after bowel movement. No other new complaints.   MEDICAL HISTORY:  Past Medical History  Diagnosis Date  . Liver metastases      SURGICAL HISTORY: Past Surgical History  Procedure Laterality Date  . Shoulder surgery    . Knee surgery    . Appendectomy      SOCIAL HISTORY: History   Social History  . Marital Status: Single    Spouse Name: N/A  . Number of Children: N/A  . Years of Education: N/A   Occupational History  . Not on file.   Social History Main Topics  . Smoking status: Former Smoker    Quit date: 10/04/2011  . Smokeless tobacco: Not on file  . Alcohol Use: No  . Drug Use: Not on file  . Sexual Activity: Not on file   Other Topics Concern  . Not on file   Social History Narrative    FAMILY HISTORY: History reviewed. No pertinent family history.  ALLERGIES:  is allergic to codeine.  MEDICATIONS:  Current Outpatient Prescriptions  Medication Sig Dispense Refill  . acetaminophen (TYLENOL) 500 MG tablet Take 500 mg by mouth every 6 (six) hours as needed (pain).    Marland Kitchen aspirin EC 81 MG tablet Take 81 mg by mouth daily.    . feeding supplement, ENSURE ENLIVE, (ENSURE ENLIVE) LIQD Take 237 mLs by mouth 3 (three) times daily between meals. 237 mL 12  . Multiple Vitamin (MULTIVITAMIN WITH MINERALS) TABS tablet Take 1 tablet by mouth daily. Centrum Silver 50 plus    . naproxen sodium (ANAPROX) 220 MG tablet Take 220 mg by mouth 2 (two) times daily as needed (pain).    Marland Kitchen OVER THE COUNTER MEDICATION  Place 1 application into both nostrils daily as needed (congestion). Vicks inhaler stick    . phenylephrine-shark liver oil-mineral oil-petrolatum (PREPARATION H) 0.25-3-14-71.9 % rectal ointment Place 1 application rectally 3 (three) times daily as needed for hemorrhoids.     No current facility-administered medications for this visit.    REVIEW OF SYSTEMS:   Constitutional: Denies fevers, chills or abnormal night sweats Eyes: Denies blurriness of vision, double vision or watery eyes Ears, nose, mouth, throat, and face: Denies mucositis or sore throat Respiratory: Denies cough, dyspnea or  wheezes Cardiovascular: Denies palpitation, chest discomfort or lower extremity swelling Gastrointestinal:  Denies nausea, heartburn or change in bowel habits Skin: Denies abnormal skin rashes Lymphatics: Denies new lymphadenopathy or easy bruising Neurological:Denies numbness, tingling or new weaknesses Behavioral/Psych: Mood is stable, no new changes  All other systems were reviewed with the patient and are negative.  PHYSICAL EXAMINATION: ECOG PERFORMANCE STATUS: 1 - Symptomatic but completely ambulatory  Filed Vitals:   02/27/15 1517  BP: 135/77  Pulse: 62  Temp: 97.8 F (36.6 C)  Resp: 18   Filed Weights   02/27/15 1517  Weight: 138 lb (62.596 kg)    GENERAL:alert, no distress and comfortable SKIN: skin color, texture, turgor are normal, no rashes or significant lesions EYES: normal, conjunctiva are pink and non-injected, sclera clear OROPHARYNX:no exudate, no erythema and lips, buccal mucosa, and tongue normal  NECK: supple, thyroid normal size, non-tender, without nodularity LYMPH:  no palpable lymphadenopathy in the cervical, axillary or inguinal LUNGS: clear to auscultation and percussion with normal breathing effort HEART: regular rate & rhythm and no murmurs and no lower extremity edema ABDOMEN:abdomen soft, non-tender and normal bowel sounds Musculoskeletal:no cyanosis of digits and no clubbing  PSYCH: alert & oriented x 3 with fluent speech NEURO: no focal motor/sensory deficits  LABORATORY DATA:  I have reviewed the data as listed CBC Latest Ref Rng 02/17/2015 02/16/2015 12/23/2008  WBC 4.0 - 10.5 K/uL 10.7(H) 8.3 -  Hemoglobin 13.0 - 17.0 g/dL 9.6(L) 6.7(LL) 14.5  Hematocrit 39.0 - 52.0 % 32.2(L) 23.9(L) 43.3  Platelets 150 - 400 K/uL 959(HH) 1067(HH) -   CMP Latest Ref Rng 02/17/2015 02/16/2015  Glucose 65 - 99 mg/dL 90 97  BUN 6 - 20 mg/dL 14 20  Creatinine 0.61 - 1.24 mg/dL 0.86 0.81  Sodium 135 - 145 mmol/L 136 135  Potassium 3.5 - 5.1 mmol/L 3.8 4.1    Chloride 101 - 111 mmol/L 100(L) 100(L)  CO2 22 - 32 mmol/L 25 26  Calcium 8.9 - 10.3 mg/dL 9.0 8.9  Total Protein 6.5 - 8.1 g/dL 6.2(L) 6.3(L)  Total Bilirubin 0.3 - 1.2 mg/dL 0.8 0.3  Alkaline Phos 38 - 126 U/L 112 106  AST 15 - 41 U/L 16 15  ALT 17 - 63 U/L 12(L) 13(L)      RADIOGRAPHIC STUDIES: I have personally reviewed the radiological images as listed and agreed with the findings in the report.  Ct Abdomen Pelvis W Contrast 02/16/2015   FINDINGS: Lung bases appear emphysematous but clear.  LEFT renal cyst 3.1 x 3.4 cm image 29.  Tiny nonobstructing calculus lower pole LEFT kidney image 29.  Kidneys, spleen, and adrenal glands normal.  Multiple low-attenuation lesions within the liver most consistent with for hepatic metastatic disease.  Two largest lesions are partially necrotic, 6.7 x 5.1 x 4.9 cm posterior RIGHT lobe image 14 and 2.6 x 2.4 x 2.6 cm anterior RIGHT lobe image 16.  Retrocaval adenopathy 14 mm short axis image  17.  Peripancreatic celiac node 12 mm short axis image 26.  Additionally, large soft tissue mass is seen within the mesentery beginning inferior to the pancreatic head and extending into the upper central pelvis measuring 4.7 x 4.5 x 8.8 cm likely confluent ileocolic adenopathy along the SMV.  This mass occludes the superior mesenteric vein, with portal and splenic veins remaining patent.  Thickening of cecum and terminal ileum highly suspicious for a cecal neoplasm.  Free fluid perihepatic, perisplenic, in RIGHT gutter, and in pelvis.  Scattered atherosclerotic calcifications.  Stomach and remaining bowel loops unremarkable.  Normal appearing bladder with prostatic enlargement.  No free air or osseous metastatic lesions.  IMPRESSION: Wall thickening of the cecum and terminal ileum high suspicious for a cecal neoplasm.  Extensive confluent ileocolic adenopathy extending cranially along the superior mesenteric vein with additional mildly enlarged peripancreatic nodes.   Multiple hepatic metastases and ascites.  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   Electronically Signed   By: Lavonia Dana M.D.   On: 02/16/2015 16:11    ASSESSMENT & PLAN: 69 year old Caucasian male without significant past medical history  1. 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 The liver biopsy surgical past findings were reviewed with them in great details. -The natural history of carcinoid tumor was reviewed with them. It is an indolent tumor, unfortunately has metastasized to liver, it is incurable at this stage. -The goal of therapy is tumor control, and the prolong his life. -I'll obtain CT chest to ruled out pulmonary metastasis -I'll check his serum chromogranin A level and 24-hour urine HIAA, and he uses it as a tumor marker to monitor his disease progression. -I discussed the treatment options, including octreotide injection (Sandostatin), which she will improve his symptoms with diarrhea, stabilize his tumor growth. The role of liver targeted therapy, such as white 90 or embolization, was also discussed with patient. Carcinoid tumor is not very sensitive to chemotherapy, if he fails octreotide therapy, I will consider ever Imus. -I'll present his case in our GI tumor conference next week -I plan to see him every 3-6 months, and repeat scan every 6-12 months.   2. 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 -I'll set up IV Feraheme 510 mg infusion twice in the next few weeks  Plan  -IV Feraheme twice in the next few weeks  -Start Sandostatin 20 mg injection every 28 days next week  -CT chest without contrast  -I'll see  him back in 5 weeks with the second dose of Sandostatin   All questions were answered. The patient knows to call the clinic with any problems, questions or concerns. I spent 55 minutes counseling the patient face to face. The total time spent in the appointment was 60 minutes and more than 50% was on counseling.     [name], MD 02/27/2015 4:12 PM

## 2015-02-27 NOTE — Progress Notes (Signed)
Checked in new patient with no issues.

## 2015-03-03 ENCOUNTER — Telehealth: Payer: Self-pay | Admitting: Hematology

## 2015-03-03 NOTE — Telephone Encounter (Signed)
Returned call to patient sister [name] re appointments for lab/injection/iron. [name] was given next appointment for 03/09/15 and will get new schedule when she comes in 03/09/15. All appointments scheduled per request of 5/27 pof's (3). Appointments schedule for 6/6 and not this week due to no care plan and authorization for sandostatin injection. Care plan for iron has been entered and authorization obtained. Message to YF and managed care re sandostatin. Message left for Vernie Shanks and Abby Potash re social work referral.

## 2015-03-04 ENCOUNTER — Other Ambulatory Visit: Payer: Self-pay | Admitting: Hematology

## 2015-03-05 ENCOUNTER — Telehealth: Payer: Self-pay | Admitting: Hematology

## 2015-03-05 NOTE — Telephone Encounter (Signed)
Faxed pt office note to [facility] [phone]

## 2015-03-06 ENCOUNTER — Encounter (HOSPITAL_COMMUNITY): Payer: Self-pay

## 2015-03-06 ENCOUNTER — Ambulatory Visit (HOSPITAL_COMMUNITY)
Admission: RE | Admit: 2015-03-06 | Discharge: 2015-03-06 | Disposition: A | Discharge status: Home / Self Care | Payer: Medicare Other | Source: Ambulatory Visit | Attending: Hematology | Admitting: Hematology

## 2015-03-06 ENCOUNTER — Encounter: Payer: Self-pay | Admitting: *Deleted

## 2015-03-06 DIAGNOSIS — C787 Secondary malignant neoplasm of liver and intrahepatic bile duct: Secondary | ICD-10-CM | POA: Insufficient documentation

## 2015-03-06 DIAGNOSIS — C189 Malignant neoplasm of colon, unspecified: Secondary | ICD-10-CM | POA: Insufficient documentation

## 2015-03-06 DIAGNOSIS — C7B00 Secondary carcinoid tumors, unspecified site: Secondary | ICD-10-CM

## 2015-03-06 DIAGNOSIS — I712 Thoracic aortic aneurysm, without rupture: Secondary | ICD-10-CM | POA: Insufficient documentation

## 2015-03-06 MED ORDER — IOHEXOL 300 MG/ML  SOLN: 100.0000 mL | Freq: Once | INTRAMUSCULAR | Status: DC | PRN

## 2015-03-06 NOTE — Progress Notes (Signed)
[facility] Work  Clinical Social Work was referred by patient navigator for assessment of psychosocial needs.  Clinical Social Worker contacted sister as referral stated sister was concerned about "frail condition of patient, requesting assistance". Sister, Ryan Cowan stated pt lived alone, but was doing very well currently. Pt has several sisters and each are handling situation in their own way, some are "overly concerned" per her report. CSW explained role of CSW team and how we can assist. No current needs noted. Family very involved and driving pt to some appointments, but he is currently independent in his ADLs. CSW provided sister with contact info of CSW team and plans to meet with pt and sister at next appointment on 03/09/15.     Clinical Social Work interventions: Supportive listening Resource education Ryan Cowan, Ryan Cowan  Ryan Cowan Phone: [phone] Fax: [phone]

## 2015-03-09 ENCOUNTER — Other Ambulatory Visit: Payer: Self-pay | Admitting: *Deleted

## 2015-03-09 ENCOUNTER — Ambulatory Visit (HOSPITAL_BASED_OUTPATIENT_CLINIC_OR_DEPARTMENT_OTHER): Payer: Medicare Other

## 2015-03-09 ENCOUNTER — Other Ambulatory Visit: Payer: Self-pay | Admitting: Hematology and Oncology

## 2015-03-09 ENCOUNTER — Ambulatory Visit: Payer: Medicare Other

## 2015-03-09 VITALS — BP 134/81 | HR 71 | Temp 98.2°F | Resp 20

## 2015-03-09 DIAGNOSIS — C7B02 Secondary carcinoid tumors of liver: Secondary | ICD-10-CM

## 2015-03-09 DIAGNOSIS — C7B09 Secondary carcinoid tumors of other sites: Secondary | ICD-10-CM

## 2015-03-09 DIAGNOSIS — C7A021 Malignant carcinoid tumor of the cecum: Secondary | ICD-10-CM | POA: Diagnosis not present

## 2015-03-09 DIAGNOSIS — D500 Iron deficiency anemia secondary to blood loss (chronic): Secondary | ICD-10-CM | POA: Diagnosis not present

## 2015-03-09 DIAGNOSIS — C7B00 Secondary carcinoid tumors, unspecified site: Secondary | ICD-10-CM

## 2015-03-09 LAB — COMPREHENSIVE METABOLIC PANEL (CC13)
ALBUMIN: 2.8 g/dL — AB (ref 3.5–5.0)
ALK PHOS: 96 U/L (ref 40–150)
ALT: 13 U/L (ref 0–55)
ANION GAP: 9 meq/L (ref 3–11)
AST: 21 U/L (ref 5–34)
BUN: 19.6 mg/dL (ref 7.0–26.0)
CO2: 25 mEq/L (ref 22–29)
Calcium: 8.5 mg/dL (ref 8.4–10.4)
Chloride: 104 mEq/L (ref 98–109)
Creatinine: 0.7 mg/dL (ref 0.7–1.3)
Glucose: 107 mg/dl (ref 70–140)
POTASSIUM: 4.4 meq/L (ref 3.5–5.1)
Sodium: 138 mEq/L (ref 136–145)
Total Bilirubin: 0.22 mg/dL (ref 0.20–1.20)
Total Protein: 5.9 g/dL — ABNORMAL LOW (ref 6.4–8.3)

## 2015-03-09 LAB — CBC & DIFF AND RETIC
BASO%: 1 % (ref 0.0–2.0)
BASOS ABS: 0.1 10*3/uL (ref 0.0–0.1)
EOS ABS: 0.4 10*3/uL (ref 0.0–0.5)
EOS%: 6.7 % (ref 0.0–7.0)
HCT: 31.3 % — ABNORMAL LOW (ref 38.4–49.9)
HGB: 9.2 g/dL — ABNORMAL LOW (ref 13.0–17.1)
Immature Retic Fract: 14.5 % — ABNORMAL HIGH (ref 3.00–10.60)
LYMPH%: 15.2 % (ref 14.0–49.0)
MCH: 22.9 pg — ABNORMAL LOW (ref 27.2–33.4)
MCHC: 29.4 g/dL — ABNORMAL LOW (ref 32.0–36.0)
MCV: 77.9 fL — ABNORMAL LOW (ref 79.3–98.0)
MONO#: 0.7 10*3/uL (ref 0.1–0.9)
MONO%: 11.8 % (ref 0.0–14.0)
NEUT#: 4 10*3/uL (ref 1.5–6.5)
NEUT%: 65.3 % (ref 39.0–75.0)
Platelets: 249 10*3/uL (ref 140–400)
RBC: 4.02 10*6/uL — ABNORMAL LOW (ref 4.20–5.82)
RDW: 27.1 % — AB (ref 11.0–14.6)
RETIC CT ABS: 20.1 10*3/uL — AB (ref 34.80–93.90)
Retic %: 0.5 % — ABNORMAL LOW (ref 0.80–1.80)
WBC: 6.1 10*3/uL (ref 4.0–10.3)
lymph#: 0.9 10*3/uL (ref 0.9–3.3)

## 2015-03-09 MED ORDER — OCTREOTIDE ACETATE 20 MG IM KIT
20.0000 mg | PACK | Freq: Once | INTRAMUSCULAR | Status: AC
  Administered 2015-03-09: 20 mg via INTRAMUSCULAR
  Filled 2015-03-09: qty 1

## 2015-03-09 MED ORDER — SODIUM CHLORIDE 0.9 % IJ SOLN
3.0000 mL | Freq: Once | INTRAMUSCULAR | Status: DC | PRN
  Filled 2015-03-09: qty 10

## 2015-03-09 MED ORDER — SODIUM CHLORIDE 0.9 % IV SOLN
510.0000 mg | Freq: Once | INTRAVENOUS | Status: AC
  Administered 2015-03-09: 510 mg via INTRAVENOUS
  Filled 2015-03-09: qty 17

## 2015-03-09 MED ORDER — SODIUM CHLORIDE 0.9 % IV SOLN
Freq: Once | INTRAVENOUS | Status: AC
Start: 2015-03-09 — End: 2015-03-09
  Administered 2015-03-09: 13:00:00 via INTRAVENOUS

## 2015-03-09 MED ORDER — HEPARIN SOD (PORK) LOCK FLUSH 100 UNIT/ML IV SOLN
500.0000 [IU] | Freq: Once | INTRAVENOUS | Status: DC | PRN
  Filled 2015-03-09: qty 5

## 2015-03-09 MED ORDER — HEPARIN SOD (PORK) LOCK FLUSH 100 UNIT/ML IV SOLN
250.0000 [IU] | Freq: Once | INTRAVENOUS | Status: DC | PRN
  Filled 2015-03-09: qty 5

## 2015-03-09 MED ORDER — ALTEPLASE 2 MG IJ SOLR
2.0000 mg | Freq: Once | INTRAMUSCULAR | Status: DC | PRN
  Filled 2015-03-09: qty 2

## 2015-03-09 MED ORDER — SODIUM CHLORIDE 0.9 % IJ SOLN
10.0000 mL | INTRAMUSCULAR | Status: DC | PRN
  Filled 2015-03-09: qty 10

## 2015-03-09 NOTE — Progress Notes (Signed)
Sandostatin injection given by infusion nurse after receiving Feraheme

## 2015-03-09 NOTE — Patient Instructions (Addendum)
Ferumoxytol injection What is this medicine? FERUMOXYTOL is an iron complex. Iron is used to make healthy red blood cells, which carry oxygen and nutrients throughout the body. This medicine is used to treat iron deficiency anemia in people with chronic kidney disease. This medicine may be used for other purposes; ask your health care provider or pharmacist if you have questions. COMMON BRAND NAME(S): Feraheme What should I tell my health care provider before I take this medicine? They need to know if you have any of these conditions: -anemia not caused by low iron levels -high levels of iron in the blood -magnetic resonance imaging (MRI) test scheduled -an unusual or allergic reaction to iron, other medicines, foods, dyes, or preservatives -pregnant or trying to get pregnant -breast-feeding How should I use this medicine? This medicine is for injection into a vein. It is given by a health care professional in a hospital or clinic setting. Talk to your pediatrician regarding the use of this medicine in children. Special care may be needed. Overdosage: If you think you've taken too much of this medicine contact a poison control center or emergency room at once. Overdosage: If you think you have taken too much of this medicine contact a poison control center or emergency room at once. NOTE: This medicine is only for you. Do not share this medicine with others. What if I miss a dose? It is important not to miss your dose. Call your doctor or health care professional if you are unable to keep an appointment. What may interact with this medicine? This medicine may interact with the following medications: -other iron products This list may not describe all possible interactions. Give your health care provider a list of all the medicines, herbs, non-prescription drugs, or dietary supplements you use. Also tell them if you smoke, drink alcohol, or use illegal drugs. Some items may interact with your  medicine. What should I watch for while using this medicine? Visit your doctor or healthcare professional regularly. Tell your doctor or healthcare professional if your symptoms do not start to get better or if they get worse. You may need blood work done while you are taking this medicine. You may need to follow a special diet. Talk to your doctor. Foods that contain iron include: whole grains/cereals, dried fruits, beans, or peas, leafy green vegetables, and organ meats (liver, kidney). What side effects may I notice from receiving this medicine? Side effects that you should report to your doctor or health care professional as soon as possible: -allergic reactions like skin rash, itching or hives, swelling of the face, lips, or tongue -breathing problems -changes in blood pressure -feeling faint or lightheaded, falls -fever or chills -flushing, sweating, or hot feelings -swelling of the ankles or feet Side effects that usually do not require medical attention (Report these to your doctor or health care professional if they continue or are bothersome.): -diarrhea -headache -nausea, vomiting -stomach pain This list may not describe all possible side effects. Call your doctor for medical advice about side effects. You may report side effects to FDA at 1-800-FDA-1088. Where should I keep my medicine? This drug is given in a hospital or clinic and will not be stored at home. NOTE: This sheet is a summary. It may not cover all possible information. If you have questions about this medicine, talk to your doctor, pharmacist, or health care provider.  2015, Elsevier/Gold Standard. (2012-05-04 15:23:36) Octreotide injection solution What is this medicine? OCTREOTIDE (ok TREE oh tide) is used  to reduce blood levels of growth hormone in patients with a condition called acromegaly. This medicine also reduces flushing and watery diarrhea caused by certain types of cancer. This medicine may be used for  other purposes; ask your health care provider or pharmacist if you have questions. COMMON BRAND NAME(S): Sandostatin, Sandostatin LAR What should I tell my health care provider before I take this medicine? They need to know if you have any of these conditions: -gallbladder disease -kidney disease -liver disease -an unusual or allergic reaction to octreotide, other medicines, foods, dyes, or preservatives -pregnant or trying to get pregnant -breast-feeding How should I use this medicine? This medicine is for injection under the skin or into a vein (only in emergency situations). It is usually given by a health care professional in a hospital or clinic setting. If you get this medicine at home, you will be taught how to prepare and give this medicine. Allow the injection solution to come to room temperature before use. Do not warm it artificially. Use exactly as directed. Take your medicine at regular intervals. Do not take your medicine more often than directed. It is important that you put your used needles and syringes in a special sharps container. Do not put them in a trash can. If you do not have a sharps container, call your pharmacist or healthcare provider to get one. Talk to your pediatrician regarding the use of this medicine in children. Special care may be needed. Overdosage: If you think you have taken too much of this medicine contact a poison control center or emergency room at once. NOTE: This medicine is only for you. Do not share this medicine with others. What if I miss a dose? If you miss a dose, take it as soon as you can. If it is almost time for your next dose, take only that dose. Do not take double or extra doses. What may interact with this medicine? Do not take this medicine with any of the following medications: -cisapride -droperidol -general anesthetics -grepafloxacin -perphenazine -thioridazine This medicine may also interact with the following  medications: -bromocriptine -cyclosporine -diuretics -medicines for blood pressure, heart disease, irregular heart beat -medicines for diabetes, including insulin -quinidine This list may not describe all possible interactions. Give your health care provider a list of all the medicines, herbs, non-prescription drugs, or dietary supplements you use. Also tell them if you smoke, drink alcohol, or use illegal drugs. Some items may interact with your medicine. What should I watch for while using this medicine? Visit your doctor or health care professional for regular checks on your progress. To help reduce irritation at the injection site, use a different site for each injection and make sure the solution is at room temperature before use. This medicine may cause increases or decreases in blood sugar. Signs of high blood sugar include frequent urination, unusual thirst, flushed or dry skin, difficulty breathing, drowsiness, stomach ache, nausea, vomiting or dry mouth. Signs of low blood sugar include chills, cool, pale skin or cold sweats, drowsiness, extreme hunger, fast heartbeat, headache, nausea, nervousness or anxiety, shakiness, trembling, unsteadiness, tiredness, or weakness. Contact your doctor or health care professional right away if you experience any of these symptoms. What side effects may I notice from receiving this medicine? Side effects that you should report to your doctor or health care professional as soon as possible: -allergic reactions like skin rash, itching or hives, swelling of the face, lips, or tongue -changes in blood sugar -changes in heart  rate -severe stomach pain Side effects that usually do not require medical attention (report to your doctor or health care professional if they continue or are bothersome): -diarrhea or constipation -gas or stomach pain -nausea, vomiting -pain, redness, swelling and irritation at site where injected This list may not describe all  possible side effects. Call your doctor for medical advice about side effects. You may report side effects to FDA at 1-800-FDA-1088. Where should I keep my medicine? Keep out of the reach of children. Store in a refrigerator between 2 and 8 degrees C (36 and 46 degrees F). Protect from light. Allow to come to room temperature naturally. Do not use artificial heat. If protected from light, the injection may be stored at room temperature between 20 and 30 degrees C (70 and 86 degrees F) for 14 days. After the initial use, throw away any unused portion of a multiple dose vial after 14 days. Throw away unused portions of the ampules after use. NOTE: This sheet is a summary. It may not cover all possible information. If you have questions about this medicine, talk to your doctor, pharmacist, or health care provider.  2015, Elsevier/Gold Standard. (2008-04-15 16:56:04)

## 2015-03-10 ENCOUNTER — Encounter: Payer: Self-pay | Admitting: *Deleted

## 2015-03-10 LAB — IRON AND TIBC
%SAT: 73 % — ABNORMAL HIGH (ref 20–55)
Iron: 652 ug/dL — ABNORMAL HIGH (ref 42–165)
TIBC: 899 ug/dL — AB (ref 215–435)
UIBC: 247 ug/dL (ref 125–400)

## 2015-03-10 LAB — FERRITIN: Ferritin: 20 ng/mL — ABNORMAL LOW (ref 22–322)

## 2015-03-10 NOTE — Progress Notes (Signed)
Received call from [facility] in [facility] lab regarding pt's ferritin level that was drawn 03/09/15. Specimen was sent late in day yesterday and hemolyzed; new order placed for reference lab to obtain results using a different method, results to be available tomorrow. Dr. Burr Medico made aware.

## 2015-03-11 ENCOUNTER — Telehealth: Payer: Self-pay | Admitting: *Deleted

## 2015-03-11 NOTE — Telephone Encounter (Signed)
Oncology Nurse Navigator Documentation  Oncology Nurse Navigator Flowsheets 02/27/2015 03/11/2015  Navigator Encounter Type Initial MedOnc Telephone- 1 week f/u  Patient Visit Type Medonc -  Treatment Phase Treatment S/P 1st SandoLAR  Barriers/Navigation Needs Family concerns;Education No barriers at this time  Education - Discussed proper nutrition and to keep bowels moving every day-call for constipation or abdominal pain  Education Method - Verbal-/teach back  Support Groups/Services GI -  Time Spent with Patient 30 10  Cabell is very detail oriented and keeps close track of what he eats and how his BM was afterwards. Suggested instead of eating an entire salad, eat 1/2 salad along with something that is low residue. Says he is outside cleaning out his car now. Reminded him of his next infusion appointment on 6/13.

## 2015-03-12 ENCOUNTER — Other Ambulatory Visit: Payer: Self-pay | Admitting: *Deleted

## 2015-03-13 ENCOUNTER — Telehealth: Payer: Self-pay | Admitting: Hematology

## 2015-03-13 LAB — CHROMOGRANIN A: Chromogranin A: 93 ng/mL — ABNORMAL HIGH (ref <=15)

## 2015-03-13 NOTE — Telephone Encounter (Signed)
Confirmed appointment for 06/13.

## 2015-03-16 ENCOUNTER — Ambulatory Visit: Payer: Medicare Other | Admitting: Nutrition

## 2015-03-16 ENCOUNTER — Other Ambulatory Visit: Payer: Medicare Other

## 2015-03-16 ENCOUNTER — Ambulatory Visit (HOSPITAL_BASED_OUTPATIENT_CLINIC_OR_DEPARTMENT_OTHER): Payer: Medicare Other

## 2015-03-16 VITALS — BP 108/68 | HR 65 | Temp 97.8°F | Resp 18

## 2015-03-16 DIAGNOSIS — D500 Iron deficiency anemia secondary to blood loss (chronic): Secondary | ICD-10-CM | POA: Diagnosis not present

## 2015-03-16 MED ORDER — SODIUM CHLORIDE 0.9 % IV SOLN
510.0000 mg | Freq: Once | INTRAVENOUS | Status: AC
  Administered 2015-03-16: 510 mg via INTRAVENOUS
  Filled 2015-03-16: qty 17

## 2015-03-16 MED ORDER — SODIUM CHLORIDE 0.9 % IV SOLN
Freq: Once | INTRAVENOUS | Status: AC
  Administered 2015-03-16: 15:00:00 via INTRAVENOUS

## 2015-03-16 NOTE — Progress Notes (Signed)
Pt reports 8-10 episodes of diarrhea a day. Dr. Burr Medico aware and in infusion room to speak with Patient.  Dr. Burr Medico encouraged appropriate use of Imodium for diarrhea. No new orders at this time.

## 2015-03-16 NOTE — Patient Instructions (Signed)

## 2015-03-16 NOTE — Progress Notes (Signed)
69 year old male diagnosed with carcinoid tumor with metastases to the liver.  Past medical history is not significant.  Medications include multivitamin.  Labs include albumen 2.9 on May 17.  Height: 6 feet 2 inches. Weight: 138 pounds. Usual body weight: 165 pounds. BMI: 17.71.  Patient reports diarrhea for the past 3 months and estimates approximately 10 stools daily. Patient has not adjusted his diet. Consumes ensure Enlive 3 times a day Patient eats yogurt daily.   Reports excellent appetite.  Nutrition diagnosis: Unintended weight loss related to new diagnosis of metastatic cancer as evidenced by 25 pound weight loss.  Intervention: Educated patient to consume smaller more frequent meals with adequate protein. Encouraged patient to continue ensure Enlive 3 times a day Educated patient to follow a bland, low fiber diet for diarrhea. Encouraged patient to discuss frequent stools with physician. Fact sheets were provided.  Questions were answered.  Teach back method was used.  Contact information was given.  Monitoring, evaluation, goals: Patient will tolerate increased oral intake to minimize further weight loss with improvement in diarrhea.  Next visit: Patient to contact me with questions or concerns.   **Disclaimer: This note was dictated with voice recognition software. Similar sounding words can inadvertently be transcribed and this note may contain transcription errors which may not have been corrected upon publication of note.**

## 2015-04-03 ENCOUNTER — Ambulatory Visit (HOSPITAL_BASED_OUTPATIENT_CLINIC_OR_DEPARTMENT_OTHER): Payer: Medicare Other

## 2015-04-03 ENCOUNTER — Telehealth: Payer: Self-pay | Admitting: Hematology

## 2015-04-03 ENCOUNTER — Other Ambulatory Visit (HOSPITAL_BASED_OUTPATIENT_CLINIC_OR_DEPARTMENT_OTHER): Payer: Medicare Other

## 2015-04-03 ENCOUNTER — Ambulatory Visit (HOSPITAL_BASED_OUTPATIENT_CLINIC_OR_DEPARTMENT_OTHER): Payer: Medicare Other | Admitting: Hematology

## 2015-04-03 ENCOUNTER — Encounter: Payer: Self-pay | Admitting: Hematology

## 2015-04-03 VITALS — BP 161/80 | HR 55 | Temp 98.3°F | Resp 16 | Ht 74.0 in | Wt 143.4 lb

## 2015-04-03 DIAGNOSIS — D500 Iron deficiency anemia secondary to blood loss (chronic): Secondary | ICD-10-CM

## 2015-04-03 DIAGNOSIS — C7A021 Malignant carcinoid tumor of the cecum: Secondary | ICD-10-CM

## 2015-04-03 DIAGNOSIS — C7A029 Malignant carcinoid tumor of the large intestine, unspecified portion: Secondary | ICD-10-CM

## 2015-04-03 DIAGNOSIS — C7B00 Secondary carcinoid tumors, unspecified site: Secondary | ICD-10-CM

## 2015-04-03 DIAGNOSIS — C7B02 Secondary carcinoid tumors of liver: Secondary | ICD-10-CM

## 2015-04-03 DIAGNOSIS — C7B09 Secondary carcinoid tumors of other sites: Secondary | ICD-10-CM | POA: Diagnosis not present

## 2015-04-03 LAB — CBC & DIFF AND RETIC
BASO%: 1.3 % (ref 0.0–2.0)
Basophils Absolute: 0.1 10*3/uL (ref 0.0–0.1)
EOS%: 3.3 % (ref 0.0–7.0)
Eosinophils Absolute: 0.2 10*3/uL (ref 0.0–0.5)
HEMATOCRIT: 36.6 % — AB (ref 38.4–49.9)
HEMOGLOBIN: 11.5 g/dL — AB (ref 13.0–17.1)
Immature Retic Fract: 9.4 % (ref 3.00–10.60)
LYMPH#: 0.8 10*3/uL — AB (ref 0.9–3.3)
LYMPH%: 16.7 % (ref 14.0–49.0)
MCH: 26.1 pg — AB (ref 27.2–33.4)
MCHC: 31.4 g/dL — ABNORMAL LOW (ref 32.0–36.0)
MCV: 83.2 fL (ref 79.3–98.0)
MONO#: 0.7 10*3/uL (ref 0.1–0.9)
MONO%: 15 % — ABNORMAL HIGH (ref 0.0–14.0)
NEUT#: 3.1 10*3/uL (ref 1.5–6.5)
NEUT%: 63.7 % (ref 39.0–75.0)
Platelets: 216 10*3/uL (ref 140–400)
RBC: 4.4 10*6/uL (ref 4.20–5.82)
RDW: 27.5 % — AB (ref 11.0–14.6)
RETIC %: 0.61 % — AB (ref 0.80–1.80)
Retic Ct Abs: 26.84 10*3/uL — ABNORMAL LOW (ref 34.80–93.90)
WBC: 4.8 10*3/uL (ref 4.0–10.3)

## 2015-04-03 MED ORDER — OCTREOTIDE ACETATE 20 MG IM KIT
20.0000 mg | PACK | Freq: Once | INTRAMUSCULAR | Status: AC
  Administered 2015-04-03: 20 mg via INTRAMUSCULAR
  Filled 2015-04-03: qty 1

## 2015-04-03 NOTE — Telephone Encounter (Signed)
Gave and printed appt sched and avs for pt for July thru Sept °

## 2015-04-03 NOTE — Progress Notes (Signed)
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 male is here because of recently diagnosed metastatic carcinoid tumor. I saw him in the hospital initially, this is his first follow-up after his hospital discharge. He presents to the clinic with his 3 sisters.  He has been having diarrhea, fatigue and dyspnea on exertion for the past 2-3 months. His diarrhea is watery stool, no hematochezia or melena. He denies significant abdominal pain, nausea, or change of his appetite. He lost about 20 pounds in the past 3 months. No fever, chills or night sweats. He has mild chronic dry cough which has not changed lately. He presented to his primary care physician 2-3 weeks ago, had lab work done, and was found to have hemoglobin 6.7. He was sent to emergency room on 02/16/2015 and was admitted. CT of abdomen and pelvis showed a cecal mass, adenopathy, and multiple liver lesions, which is highly suspicious for metastatic colon cancer. He received 2 units of blood transfusion, IV dextran 250 mg, and underwent liver biopsy. He was discharged home after the biopsy.  He is single, no children. He was independent, but not very physically active. He lives in a trailer. He has 3 sisters and parents live in [place] area. No family history of colon cancer or other malignancy. He moved to his mother's house, and is now very close to his sisters. His mom lives in a nursing home.  He feels better lately, gained some weight. His diarrhea is unchanged. He has mild intermittent low abdominal pain after bowel movement. No other new complaints.  Interim history:  Mr. Mcgaha returns for follow up. He has been feeling better, more energy, and eats  well. His diarrhea has resolved. He did stop diary products a few weeks, which help diarrhea also. He had muscular pain from the Sandostatin injection for several days, otherwise tolerated treatment well. He has moved out his overall home and stays with his mother now. No fever, occasional right side abdominal cramps. No nausea or vomiting. Gained some weight back. No fever or hot flush.    MEDICAL HISTORY:  Past Medical History  Diagnosis Date  . Liver metastases     SURGICAL HISTORY: Past Surgical History  Procedure Laterality Date  . Shoulder surgery    . Knee surgery    . Appendectomy      SOCIAL HISTORY: History   Social History  . Marital Status: Single    Spouse Name: N/A  . Number of Children: N/A  . Years of Education: N/A   Occupational History  . Not on file.   Social History Main Topics  . Smoking status: Former Smoker -- 2.50 packs/day for 50 years    Quit date: 10/04/2011  . Smokeless tobacco: Not on file  . Alcohol Use: No  . Drug Use: Not on file  . Sexual Activity: Not on file   Other Topics Concern  . Not on file   Social History Narrative   Lives alone in his mother's home (in SNF)   Has #3 sisters, [name] is POA   Prior employed in [employer] and was in [employer] 4 years   Very detail oriented in conversation    FAMILY HISTORY: Family History  Problem Relation Age of Onset  . CAD Mother   . CAD Father   . Hypertension Father   . Hypertension Sister     ALLERGIES:  is allergic to codeine.  MEDICATIONS:  Current Outpatient Prescriptions  Medication Sig Dispense Refill  . acetaminophen (TYLENOL) 500 MG tablet Take 500 mg by mouth every 6 (six) hours as needed (pain).    Marland Kitchen aspirin EC 81 MG tablet Take 81 mg by mouth daily.    . feeding supplement, ENSURE ENLIVE, (ENSURE ENLIVE) LIQD Take 237 mLs by mouth 3 (three) times daily between meals. 237 mL 12  . Multiple Vitamin (MULTIVITAMIN WITH MINERALS) TABS tablet Take 1 tablet  by mouth daily. Centrum Silver 50 plus    . naproxen sodium (ANAPROX) 220 MG tablet Take 220 mg by mouth 2 (two) times daily as needed (pain).    Marland Kitchen OVER THE COUNTER MEDICATION Place 1 application into both nostrils daily as needed (congestion). Vicks inhaler stick    . phenylephrine-shark liver oil-mineral oil-petrolatum (PREPARATION H) 0.25-3-14-71.9 % rectal ointment Place 1 application rectally 3 (three) times daily as needed for hemorrhoids.     No current facility-administered medications for this visit.    REVIEW OF SYSTEMS:   Constitutional: Denies fevers, chills or abnormal night sweats Eyes: Denies blurriness of vision, double vision or watery eyes Ears, nose, mouth, throat, and face: Denies mucositis or sore throat Respiratory: Denies cough, dyspnea or wheezes Cardiovascular: Denies palpitation, chest discomfort or lower extremity swelling Gastrointestinal:  Denies nausea, heartburn or change in bowel habits Skin: Denies abnormal skin rashes Lymphatics: Denies new lymphadenopathy or easy bruising Neurological:Denies numbness, tingling or new weaknesses Behavioral/Psych: Mood is stable, no new changes  All other systems were reviewed with the patient and are negative.  PHYSICAL EXAMINATION: ECOG PERFORMANCE STATUS: 1 - Symptomatic but completely ambulatory  Filed Vitals:   04/03/15 1336  BP: 161/80  Pulse: 55  Temp: 98.3 F (36.8 C)  Resp: 16   Filed Weights   04/03/15 1336  Weight: 143 lb 6.4 oz (65.046 kg)    GENERAL:alert, no distress and comfortable SKIN: skin color, texture, turgor are normal, no rashes or significant lesions EYES: normal, conjunctiva are pink and non-injected, sclera clear OROPHARYNX:no exudate, no erythema and lips, buccal mucosa, and tongue normal  NECK: supple, thyroid normal size, non-tender, without nodularity LYMPH:  no palpable lymphadenopathy in the cervical, axillary or inguinal LUNGS: clear to auscultation and percussion with normal  breathing effort HEART: regular rate & rhythm and no murmurs and no lower extremity edema ABDOMEN:abdomen soft, non-tender and normal bowel sounds Musculoskeletal:no cyanosis of digits and no clubbing  PSYCH: alert & oriented x 3 with fluent speech NEURO: no focal motor/sensory deficits  LABORATORY DATA:  I have reviewed the data as listed CBC Latest Ref Rng 04/03/2015 03/09/2015 02/17/2015  WBC 4.0 - 10.3 10e3/uL 4.8 6.1 10.7(H)  Hemoglobin 13.0 - 17.1 g/dL 11.5(L) 9.2(L) 9.6(L)  Hematocrit 38.4 - 49.9 % 36.6(L) 31.3(L) 32.2(L)  Platelets 140 - 400 10e3/uL 216 249 959(HH)   CMP Latest Ref Rng 03/09/2015 02/17/2015 02/16/2015  Glucose 70 - 140 mg/dl 107 90 97  BUN 7.0 - 26.0 mg/dL 19.6 14 20   Creatinine 0.7 - 1.3 mg/dL 0.7 0.86 0.81  Sodium 136 - 145 mEq/L 138 136 135  Potassium 3.5 - 5.1 mEq/L 4.4 3.8 4.1  Chloride 101 - 111 mmol/L - 100(L) 100(L)  CO2 22 - 29 mEq/L 25 25 26   Calcium 8.4 - 10.4 mg/dL 8.5 9.0  8.9  Total Protein 6.4 - 8.3 g/dL 5.9(L) 6.2(L) 6.3(L)  Total Bilirubin 0.20 - 1.20 mg/dL 0.22 0.8 0.3  Alkaline Phos 40 - 150 U/L 96 112 106  AST 5 - 34 U/L 21 16 15   ALT 0 - 55 U/L 13 12(L) 13(L)   PATHOLOGY REPORT: Diagnosis 02/18/2015 Liver, needle/core biopsy, left lobe - METASTATIC LOW GRADE NEUROENDOCRINE TUMOR (CARCINOID). - SEE COMMENT. Microscopic Comment Immunohistochemical stains are performed. The tumor is positive for CDX-2, CD56, chromogranin, and synaptophysin; it is negative for cytokeratin 20, cytokeratin 7, and TTF-1. The morphology coupled with the staining pattern is consistent with a low grade neuroendocrine tumor (carcinoid) of gastrointestinal primary source. Dr. Donato Heinz has seen this case in consultation with agreement. The findings are called to Dr. Burr Medico on 02/20/15. (RH:ds 02/20/15)   RADIOGRAPHIC STUDIES: I have personally reviewed the radiological images as listed and agreed with the findings in the report.  Ct Abdomen Pelvis W Contrast 02/16/2015     FINDINGS: Lung bases appear emphysematous but clear.  LEFT renal cyst 3.1 x 3.4 cm image 29.  Tiny nonobstructing calculus lower pole LEFT kidney image 29.  Kidneys, spleen, and adrenal glands normal.  Multiple low-attenuation lesions within the liver most consistent with for hepatic metastatic disease.  Two largest lesions are partially necrotic, 6.7 x 5.1 x 4.9 cm posterior RIGHT lobe image 14 and 2.6 x 2.4 x 2.6 cm anterior RIGHT lobe image 16.  Retrocaval adenopathy 14 mm short axis image 17.  Peripancreatic celiac node 12 mm short axis image 26.  Additionally, large soft tissue mass is seen within the mesentery beginning inferior to the pancreatic head and extending into the upper central pelvis measuring 4.7 x 4.5 x 8.8 cm likely confluent ileocolic adenopathy along the SMV.  This mass occludes the superior mesenteric vein, with portal and splenic veins remaining patent.  Thickening of cecum and terminal ileum highly suspicious for a cecal neoplasm.  Free fluid perihepatic, perisplenic, in RIGHT gutter, and in pelvis.  Scattered atherosclerotic calcifications.  Stomach and remaining bowel loops unremarkable.  Normal appearing bladder with prostatic enlargement.  No free air or osseous metastatic lesions.  IMPRESSION: Wall thickening of the cecum and terminal ileum high suspicious for a cecal neoplasm.  Extensive confluent ileocolic adenopathy extending cranially along the superior mesenteric vein with additional mildly enlarged peripancreatic nodes.  Multiple hepatic metastases and ascites.  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   Electronically Signed   By: Lavonia Dana M.D.   On: 02/16/2015 16:11   CT chest 03/06/2015 IMPRESSION: No evidence of metastatic disease in the chest.  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 Recommend semi-annual imaging followup by CTA or MRA and referral to cardiothoracic surgery if not already obtained. This recommendation follows  2010 ACCF/AHA/AATS/ACR/ASA/SCA/SCAI/SIR/STS/SVM Guidelines for the Diagnosis and Management of Patients With Thoracic Aortic Disease. Circulation. 2010; 121: e266-e36  Hepatic metastases in the upper abdomen, better evaluated on recent enhanced CT.  ASSESSMENT & PLAN: 69 year old Caucasian male without significant past medical history  1. 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 The liver biopsy surgical past findings were reviewed with them in great details. -The natural history of carcinoid tumor was reviewed with them. It is an indolent tumor, unfortunately has metastasized to liver, it is incurable at this stage. -The goal of therapy is tumor control, and the prolong his life. -CT  chest was negative for pulmonary metastasis -His initial serum chromogranin was elevated at 93 -I discussed the treatment options, including octreotide injection (Sandostatin), which will improve his symptoms with diarrhea, stabilize his tumor growth. The role of liver targeted therapy, such as white 90 or embolization, was also discussed with patient. Carcinoid tumor is not very sensitive to chemotherapy, if he fails octreotide therapy, I will consider everolimus -He has very bulky abdominal adenopathy, potentially can cause mall bowel obstruction and SMV occlusion. This was discussed with the patient and his family -We'll continue Sandostatin monthly, he tolerated his dose 20 mg well, will increase to 30 mg next months -I plan to see him every 3-6 months, and repeat scan every 6-12 months.   2. 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 -His anemia has significantly improved  after IV Feraheme, hemoglobin 11.5 today -We'll check his CBC and ferritin level every 3 months  Plan  -continue Sandostatin injection every 28 days, increase to 30mg  next month  -I will see him back in 3 month   All questions were answered. The patient knows to call the clinic with any problems, questions or concerns. I spent 25 minutes counseling the patient face to face. The total time spent in the appointment was 30 minutes and more than 50% was on counseling.     [name], MD 04/03/2015 2:25 PM

## 2015-04-04 ENCOUNTER — Encounter: Payer: Self-pay | Admitting: Hematology

## 2015-04-07 ENCOUNTER — Telehealth: Payer: Self-pay | Admitting: *Deleted

## 2015-04-07 NOTE — Telephone Encounter (Signed)
Oncology Nurse Navigator Documentation  Oncology Nurse Navigator Flowsheets 04/07/2015  Navigator Encounter Type Telephone 1 month F/U  Patient Visit Type -  Treatment Phase -  Barriers/Navigation Needs No barriers at this time  Education -  Education Method -  Support Groups/Services -  Time Spent with Sister 54  Sister reports he is doing well. Good energy, no diarrhea. Gets out and does what he wants. Goes to church every Wednesday night and Sunday. Reports he has learned to work his cell phone, so he can be reached on that # now. Still living in his mother's home-she is in SNF.

## 2015-04-08 LAB — 5 HIAA, QUANTITATIVE, URINE, 24 HOUR
5-HIAA, 24 Hr Urine: 51.3 mg/24 h — ABNORMAL HIGH (ref <=6.0)
Volume, Urine-5HIAA: 1800 mL/24 h

## 2015-05-01 ENCOUNTER — Ambulatory Visit (HOSPITAL_BASED_OUTPATIENT_CLINIC_OR_DEPARTMENT_OTHER): Payer: Medicare Other

## 2015-05-01 VITALS — BP 142/86 | HR 59 | Temp 98.0°F

## 2015-05-01 DIAGNOSIS — C7B02 Secondary carcinoid tumors of liver: Secondary | ICD-10-CM | POA: Diagnosis not present

## 2015-05-01 DIAGNOSIS — D500 Iron deficiency anemia secondary to blood loss (chronic): Secondary | ICD-10-CM

## 2015-05-01 DIAGNOSIS — C7B09 Secondary carcinoid tumors of other sites: Secondary | ICD-10-CM | POA: Diagnosis not present

## 2015-05-01 DIAGNOSIS — C7A021 Malignant carcinoid tumor of the cecum: Secondary | ICD-10-CM | POA: Diagnosis not present

## 2015-05-01 MED ORDER — OCTREOTIDE ACETATE 30 MG IM KIT
30.0000 mg | PACK | Freq: Once | INTRAMUSCULAR | Status: AC
  Administered 2015-05-01: 30 mg via INTRAMUSCULAR
  Filled 2015-05-01: qty 1

## 2015-05-29 ENCOUNTER — Telehealth: Payer: Self-pay | Admitting: *Deleted

## 2015-05-29 ENCOUNTER — Other Ambulatory Visit (HOSPITAL_BASED_OUTPATIENT_CLINIC_OR_DEPARTMENT_OTHER): Payer: Medicare Other

## 2015-05-29 ENCOUNTER — Ambulatory Visit (HOSPITAL_BASED_OUTPATIENT_CLINIC_OR_DEPARTMENT_OTHER): Payer: Medicare Other

## 2015-05-29 VITALS — BP 142/84 | HR 61 | Temp 98.2°F

## 2015-05-29 DIAGNOSIS — C7A021 Malignant carcinoid tumor of the cecum: Secondary | ICD-10-CM

## 2015-05-29 DIAGNOSIS — C7B02 Secondary carcinoid tumors of liver: Secondary | ICD-10-CM

## 2015-05-29 DIAGNOSIS — C7B09 Secondary carcinoid tumors of other sites: Secondary | ICD-10-CM

## 2015-05-29 DIAGNOSIS — C7B00 Secondary carcinoid tumors, unspecified site: Secondary | ICD-10-CM

## 2015-05-29 DIAGNOSIS — D500 Iron deficiency anemia secondary to blood loss (chronic): Secondary | ICD-10-CM

## 2015-05-29 LAB — CBC & DIFF AND RETIC
BASO%: 0.8 % (ref 0.0–2.0)
BASOS ABS: 0.1 10*3/uL (ref 0.0–0.1)
EOS ABS: 0.1 10*3/uL (ref 0.0–0.5)
EOS%: 1.6 % (ref 0.0–7.0)
HEMATOCRIT: 39.5 % (ref 38.4–49.9)
HEMOGLOBIN: 13.3 g/dL (ref 13.0–17.1)
IMMATURE RETIC FRACT: 0.4 % — AB (ref 3.00–10.60)
LYMPH%: 12.3 % — AB (ref 14.0–49.0)
MCH: 30.2 pg (ref 27.2–33.4)
MCHC: 33.7 g/dL (ref 32.0–36.0)
MCV: 89.8 fL (ref 79.3–98.0)
MONO#: 0.8 10*3/uL (ref 0.1–0.9)
MONO%: 10.5 % (ref 0.0–14.0)
NEUT#: 5.9 10*3/uL (ref 1.5–6.5)
NEUT%: 74.8 % (ref 39.0–75.0)
Platelets: 217 10*3/uL (ref 140–400)
RBC: 4.4 10*6/uL (ref 4.20–5.82)
RDW: 16.6 % — ABNORMAL HIGH (ref 11.0–14.6)
Retic %: 0.6 % — ABNORMAL LOW (ref 0.80–1.80)
Retic Ct Abs: 26.4 10*3/uL — ABNORMAL LOW (ref 34.80–93.90)
WBC: 7.9 10*3/uL (ref 4.0–10.3)
lymph#: 1 10*3/uL (ref 0.9–3.3)

## 2015-05-29 LAB — IRON AND TIBC CHCC
%SAT: 22 % (ref 20–55)
IRON: 70 ug/dL (ref 42–163)
TIBC: 314 ug/dL (ref 202–409)
UIBC: 244 ug/dL (ref 117–376)

## 2015-05-29 LAB — COMPREHENSIVE METABOLIC PANEL (CC13)
ALT: 12 U/L (ref 0–55)
ANION GAP: 9 meq/L (ref 3–11)
AST: 14 U/L (ref 5–34)
Albumin: 3.6 g/dL (ref 3.5–5.0)
Alkaline Phosphatase: 103 U/L (ref 40–150)
BUN: 20.2 mg/dL (ref 7.0–26.0)
CALCIUM: 9.3 mg/dL (ref 8.4–10.4)
CHLORIDE: 105 meq/L (ref 98–109)
CO2: 28 meq/L (ref 22–29)
CREATININE: 0.8 mg/dL (ref 0.7–1.3)
EGFR: 89 mL/min/{1.73_m2} — AB (ref >90)
Glucose: 113 mg/dl (ref 70–140)
Potassium: 4.1 mEq/L (ref 3.5–5.1)
Sodium: 142 mEq/L (ref 136–145)
Total Bilirubin: 0.27 mg/dL (ref 0.20–1.20)
Total Protein: 6.3 g/dL — ABNORMAL LOW (ref 6.4–8.3)

## 2015-05-29 LAB — FERRITIN CHCC: FERRITIN: 40 ng/mL (ref 22–316)

## 2015-05-29 MED ORDER — OCTREOTIDE ACETATE 30 MG IM KIT
30.0000 mg | PACK | Freq: Once | INTRAMUSCULAR | Status: AC
  Administered 2015-05-29: 30 mg via INTRAMUSCULAR
  Filled 2015-05-29: qty 1

## 2015-05-29 NOTE — Telephone Encounter (Signed)
Oncology Nurse Navigator Documentation  Oncology Nurse Navigator Flowsheets 05/29/2015  Navigator Encounter Type Telephone;3 month  Treatment Phase Treatment-monthly Sando[facility]  Barriers/Navigation Needs Left VM for patient to call if he has any questions or problems we can help him with regarding his cancer dx.  Support Groups/Services -

## 2015-06-02 ENCOUNTER — Telehealth: Payer: Self-pay | Admitting: Hematology

## 2015-06-02 NOTE — Telephone Encounter (Signed)
Faxed pt medical records to [facility] [phone]

## 2015-06-03 LAB — CHROMOGRANIN A: Chromogranin A: 43 ng/mL — ABNORMAL HIGH (ref <=15)

## 2015-06-26 ENCOUNTER — Ambulatory Visit (HOSPITAL_BASED_OUTPATIENT_CLINIC_OR_DEPARTMENT_OTHER): Payer: Medicare Other | Admitting: Hematology

## 2015-06-26 ENCOUNTER — Ambulatory Visit (HOSPITAL_BASED_OUTPATIENT_CLINIC_OR_DEPARTMENT_OTHER): Payer: Medicare Other

## 2015-06-26 ENCOUNTER — Telehealth: Payer: Self-pay | Admitting: Hematology

## 2015-06-26 ENCOUNTER — Encounter: Payer: Self-pay | Admitting: Hematology

## 2015-06-26 VITALS — BP 143/77 | HR 73 | Temp 98.3°F | Resp 18 | Ht 74.0 in | Wt 151.1 lb

## 2015-06-26 DIAGNOSIS — C7A029 Malignant carcinoid tumor of the large intestine, unspecified portion: Secondary | ICD-10-CM | POA: Diagnosis not present

## 2015-06-26 DIAGNOSIS — C7B02 Secondary carcinoid tumors of liver: Secondary | ICD-10-CM | POA: Diagnosis not present

## 2015-06-26 DIAGNOSIS — C7B09 Secondary carcinoid tumors of other sites: Secondary | ICD-10-CM | POA: Diagnosis not present

## 2015-06-26 DIAGNOSIS — C7A021 Malignant carcinoid tumor of the cecum: Secondary | ICD-10-CM

## 2015-06-26 DIAGNOSIS — D500 Iron deficiency anemia secondary to blood loss (chronic): Secondary | ICD-10-CM

## 2015-06-26 MED ORDER — OCTREOTIDE ACETATE 30 MG IM KIT
30.0000 mg | PACK | Freq: Once | INTRAMUSCULAR | Status: AC
  Administered 2015-06-26: 30 mg via INTRAMUSCULAR
  Filled 2015-06-26: qty 1

## 2015-06-26 NOTE — Progress Notes (Signed)
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 Extensive confluent ileal colic adenopathy extending chronically along the superior mesenteric vein, multiple hepatic metastasis   02/18/2015 Initial Diagnosis Carcinoid tumor of colon, malignant, with liver metastasis   02/18/2015 Initial Biopsy Liver biopsy showed metastatic low-grade neuroendocrine tumor (carcinoid). The tumor is positive for CD X2, CD 56, chromogranin, and synaptophysin.   03/05/2015 -  Chemotherapy Sandostatin 20 mg IM every 4 weeks   03/06/2015 Imaging CT chest showed no evidence of metastasis in the chest. 4.4 cm ascending aortic aneurysm.    HISTORY OF PRESENTING ILLNESS:  Ryan Cowan 69 y.o. male is here because of recently diagnosed metastatic carcinoid tumor. I saw him in the hospital initially, this is his first follow-up after his hospital discharge. He presents to the clinic with his 3 sisters.  He has been having diarrhea, fatigue and dyspnea on exertion for the past 2-3 months. His diarrhea is watery stool, no hematochezia or melena. He denies significant abdominal pain, nausea, or change of his appetite. He lost about 20 pounds in the past 3 months. No fever, chills or night sweats. He has mild chronic dry cough which has not changed lately. He presented to his primary care physician 2-3 weeks ago, had lab work done, and was found to have  hemoglobin 6.7. He was sent to emergency room on 02/16/2015 and was admitted. CT of abdomen and pelvis showed a cecal mass, adenopathy, and multiple liver lesions, which is highly suspicious for metastatic colon cancer. He received 2 units of blood transfusion, IV dextran 250 mg, and underwent liver biopsy. He was discharged home after the biopsy.  He is single, no children. He was independent, but not very physically active. He lives in a trailer. He has 3 sisters and parents live in [place] area. No family history of colon cancer or other malignancy. He moved to his mother's house, and is now very close to his sisters. His mom lives in a nursing home.  He feels better lately, gained some weight. His diarrhea is unchanged. He has mild intermittent low abdominal pain after bowel movement. No other new complaints.  Interim history:  [name] returns for follow up. He is accompanied to clinic by his sisters. He is doing well overall. He has much better appetite and eats well, he gained about 25 lbs in the past 3 months. He has normal BM, no diarrhea. He denies any pain , nausea, flush or other symptoms.    MEDICAL HISTORY:  Past Medical History  Diagnosis Date  . Liver metastases     SURGICAL HISTORY: Past Surgical History  Procedure Laterality Date  . Shoulder surgery    . Knee surgery    . Appendectomy      SOCIAL HISTORY: Social History   Social History  . Marital Status: Single    Spouse Name: N/A  . Number of Children: N/A  .  Years of Education: N/A   Occupational History  . Not on file.   Social History Main Topics  . Smoking status: Former Smoker -- 2.50 packs/day for 50 years    Quit date: 10/04/2011  . Smokeless tobacco: Not on file  . Alcohol Use: No  . Drug Use: Not on file  . Sexual Activity: Not on file   Other Topics Concern  . Not on file   Social History Narrative   Lives alone in his mother's home (in SNF)   Has #3 sisters, [name] is POA    Prior employed in [employer] and was in [employer] 4 years   Very detail oriented in conversation    FAMILY HISTORY: Family History  Problem Relation Age of Onset  . CAD Mother   . CAD Father   . Hypertension Father   . Hypertension Sister     ALLERGIES:  is allergic to codeine.  MEDICATIONS:  Current Outpatient Prescriptions  Medication Sig Dispense Refill  . feeding supplement, ENSURE ENLIVE, (ENSURE ENLIVE) LIQD Take 237 mLs by mouth 3 (three) times daily between meals. 237 mL 12  . Multiple Vitamin (MULTIVITAMIN WITH MINERALS) TABS tablet Take 1 tablet by mouth daily. Centrum Silver 50 plus    . naproxen sodium (ANAPROX) 220 MG tablet Take 220 mg by mouth 2 (two) times daily as needed (pain).    Marland Kitchen OVER THE COUNTER MEDICATION Place 1 application into both nostrils daily as needed (congestion). Vicks inhaler stick     No current facility-administered medications for this visit.    REVIEW OF SYSTEMS:   Constitutional: Denies fevers, chills or abnormal night sweats Eyes: Denies blurriness of vision, double vision or watery eyes Ears, nose, mouth, throat, and face: Denies mucositis or sore throat Respiratory: Denies cough, dyspnea or wheezes Cardiovascular: Denies palpitation, chest discomfort or lower extremity swelling Gastrointestinal:  Denies nausea, heartburn or change in bowel habits Skin: Denies abnormal skin rashes Lymphatics: Denies new lymphadenopathy or easy bruising Neurological:Denies numbness, tingling or new weaknesses Behavioral/Psych: Mood is stable, no new changes  All other systems were reviewed with the patient and are negative.  PHYSICAL EXAMINATION: ECOG PERFORMANCE STATUS: 1 - Symptomatic but completely ambulatory  Filed Vitals:   06/26/15 1251  BP: 143/77  Pulse: 73  Temp: 98.3 F (36.8 C)  Resp: 18   Filed Weights   06/26/15 1251  Weight: 151 lb 1.6 oz (68.539 kg)    GENERAL:alert, no distress and comfortable SKIN: skin color,  texture, turgor are normal, no rashes or significant lesions EYES: normal, conjunctiva are pink and non-injected, sclera clear OROPHARYNX:no exudate, no erythema and lips, buccal mucosa, and tongue normal  NECK: supple, thyroid normal size, non-tender, without nodularity LYMPH:  no palpable lymphadenopathy in the cervical, axillary or inguinal LUNGS: clear to auscultation and percussion with normal breathing effort HEART: regular rate & rhythm and no murmurs and no lower extremity edema ABDOMEN:abdomen soft, non-tender and normal bowel sounds Musculoskeletal:no cyanosis of digits and no clubbing  PSYCH: alert & oriented x 3 with fluent speech NEURO: no focal motor/sensory deficits  LABORATORY DATA:  I have reviewed the data as listed CBC Latest Ref Rng 05/29/2015 04/03/2015 03/09/2015  WBC 4.0 - 10.3 10e3/uL 7.9 4.8 6.1  Hemoglobin 13.0 - 17.1 g/dL 13.3 11.5(L) 9.2(L)  Hematocrit 38.4 - 49.9 % 39.5 36.6(L) 31.3(L)  Platelets 140 - 400 10e3/uL 217 216 249   CMP Latest Ref Rng 05/29/2015 03/09/2015 02/17/2015  Glucose 70 - 140 mg/dl 113  107 90  BUN 7.0 - 26.0 mg/dL 20.2 19.6 14  Creatinine 0.7 - 1.3 mg/dL 0.8 0.7 0.86  Sodium 136 - 145 mEq/L 142 138 136  Potassium 3.5 - 5.1 mEq/L 4.1 4.4 3.8  Chloride 101 - 111 mmol/L - - 100(L)  CO2 22 - 29 mEq/L 28 25 25   Calcium 8.4 - 10.4 mg/dL 9.3 8.5 9.0  Total Protein 6.4 - 8.3 g/dL 6.3(L) 5.9(L) 6.2(L)  Total Bilirubin 0.20 - 1.20 mg/dL 0.27 0.22 0.8  Alkaline Phos 40 - 150 U/L 103 96 112  AST 5 - 34 U/L 14 21 16   ALT 0 - 55 U/L 12 13 12(L)   Results for HELIODORO, DOMAGALSKI (MRN [id number]) as of 06/26/2015 08:02  Ref. Range 02/16/2015 12:57 03/09/2015 11:08 05/29/2015 13:07  Iron Latest Ref Range: 42-163 ug/dL 8 (L) 652 (H) 70  UIBC Latest Ref Range: 117-376 ug/dL 339 247 244  TIBC Latest Ref Range: 202-409 ug/dL 347 899 (H) 314  %SAT Latest Ref Range: 20-55 %  73 (H) 22  Saturation Ratios Latest Ref Range: 17.9-39.5 % 2 (L)    Ferritin Latest Ref  Range: 22-316 ng/ml 15 (L) 20 (L) 40  Folate Latest Ref Range: >5.9 ng/mL 27.0     Chromogranin A (<=15ng/ml) 04/03/2015: 93 05/29/2015: 43  24 hour urine 5-HIAA (<6.0 mg/24h) 04/03/2015: 51.3  PATHOLOGY REPORT: Diagnosis 02/18/2015 Liver, needle/core biopsy, left lobe - METASTATIC LOW GRADE NEUROENDOCRINE TUMOR (CARCINOID). - SEE COMMENT. Microscopic Comment Immunohistochemical stains are performed. The tumor is positive for CDX-2, CD56, chromogranin, and synaptophysin; it is negative for cytokeratin 20, cytokeratin 7, and TTF-1. The morphology coupled with the staining pattern is consistent with a low grade neuroendocrine tumor (carcinoid) of gastrointestinal primary source. Dr. Donato Heinz has seen this case in consultation with agreement. The findings are called to Dr. Burr Medico on 02/20/15. (RH:ds 02/20/15)   RADIOGRAPHIC STUDIES: I have personally reviewed the radiological images as listed and agreed with the findings in the report.  Ct Abdomen Pelvis W Contrast 02/16/2015   FINDINGS: Lung bases appear emphysematous but clear.  LEFT renal cyst 3.1 x 3.4 cm image 29.  Tiny nonobstructing calculus lower pole LEFT kidney image 29.  Kidneys, spleen, and adrenal glands normal.  Multiple low-attenuation lesions within the liver most consistent with for hepatic metastatic disease.  Two largest lesions are partially necrotic, 6.7 x 5.1 x 4.9 cm posterior RIGHT lobe image 14 and 2.6 x 2.4 x 2.6 cm anterior RIGHT lobe image 16.  Retrocaval adenopathy 14 mm short axis image 17.  Peripancreatic celiac node 12 mm short axis image 26.  Additionally, large soft tissue mass is seen within the mesentery beginning inferior to the pancreatic head and extending into the upper central pelvis measuring 4.7 x 4.5 x 8.8 cm likely confluent ileocolic adenopathy along the SMV.  This mass occludes the superior mesenteric vein, with portal and splenic veins remaining patent.  Thickening of cecum and terminal ileum highly suspicious  for a cecal neoplasm.  Free fluid perihepatic, perisplenic, in RIGHT gutter, and in pelvis.  Scattered atherosclerotic calcifications.  Stomach and remaining bowel loops unremarkable.  Normal appearing bladder with prostatic enlargement.  No free air or osseous metastatic lesions.  IMPRESSION: Wall thickening of the cecum and terminal ileum high suspicious for a cecal neoplasm.  Extensive confluent ileocolic adenopathy extending cranially along the superior mesenteric vein with additional mildly enlarged peripancreatic nodes.  Multiple hepatic metastases and ascites.  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  Electronically Signed   By: Lavonia Dana M.D.   On: 02/16/2015 16:11   CT chest 03/06/2015 IMPRESSION: No evidence of metastatic disease in the chest.  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 Recommend semi-annual imaging followup by CTA or MRA and referral to cardiothoracic surgery if not already obtained. This recommendation follows 2010 ACCF/AHA/AATS/ACR/ASA/SCA/SCAI/SIR/STS/SVM Guidelines for the Diagnosis and Management of Patients With Thoracic Aortic Disease. Circulation. 2010; 121: e266-e36  Hepatic metastases in the upper abdomen, better evaluated on recent enhanced CT.  ASSESSMENT & PLAN: 69 year old Caucasian male without significant past medical history  1. 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 The liver biopsy surgical past findings were reviewed with them in great details. -The natural history of carcinoid tumor was reviewed with them. It is an indolent tumor, unfortunately has metastasized to liver, it is incurable at this stage. -The goal of therapy is tumor control, and the prolong his life. -CT chest was negative for pulmonary metastasis -His initial serum chromogranin was elevated at 93, has come down to 43 after 2 Sandostatin  injections. -He is tolerating Sandostatin injection very well, clinically much improved. We'll continue -He has very bulky abdominal adenopathy, potentially can cause mall bowel obstruction and SMV occlusion. This was discussed with the patient and his family -Repeat CT scans in 3 months   2. 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 Malnutrition -Has much improved, he has gained about 25 pounds.  Plan  -continue Sandostatin injection every 28 days -Repeat labs every 2 months for now -I will see him back in 3 month, with a repeated CT scan.  All questions were answered. The patient knows to call the clinic with any problems, questions or concerns. I spent 25 minutes counseling the patient face to face. The total time spent in the appointment was 30 minutes and more than 50% was on counseling.     [name], MD 06/26/2015 5:08 PM

## 2015-06-26 NOTE — Telephone Encounter (Signed)
Pt confirmed labs/ov per 09/23 POF, gave pt AVS and Calendar... KJ, gave pt barium

## 2015-06-26 NOTE — Patient Instructions (Signed)

## 2015-07-23 ENCOUNTER — Other Ambulatory Visit: Payer: Self-pay | Admitting: *Deleted

## 2015-07-24 ENCOUNTER — Ambulatory Visit (HOSPITAL_BASED_OUTPATIENT_CLINIC_OR_DEPARTMENT_OTHER): Payer: Medicare Other

## 2015-07-24 ENCOUNTER — Other Ambulatory Visit: Payer: Self-pay | Admitting: Hematology

## 2015-07-24 ENCOUNTER — Other Ambulatory Visit: Payer: Medicare Other

## 2015-07-24 ENCOUNTER — Telehealth: Payer: Self-pay | Admitting: Hematology

## 2015-07-24 VITALS — BP 141/84 | HR 59 | Temp 97.7°F | Resp 18

## 2015-07-24 DIAGNOSIS — D500 Iron deficiency anemia secondary to blood loss (chronic): Secondary | ICD-10-CM

## 2015-07-24 DIAGNOSIS — C7B02 Secondary carcinoid tumors of liver: Secondary | ICD-10-CM | POA: Diagnosis not present

## 2015-07-24 DIAGNOSIS — C7B09 Secondary carcinoid tumors of other sites: Secondary | ICD-10-CM | POA: Diagnosis not present

## 2015-07-24 DIAGNOSIS — C7A021 Malignant carcinoid tumor of the cecum: Secondary | ICD-10-CM

## 2015-07-24 MED ORDER — OCTREOTIDE ACETATE 30 MG IM KIT
30.0000 mg | PACK | Freq: Once | INTRAMUSCULAR | Status: AC
Start: 2015-07-24 — End: 2015-07-24
  Administered 2015-07-24: 30 mg via INTRAMUSCULAR
  Filled 2015-07-24: qty 1

## 2015-07-24 NOTE — Patient Instructions (Signed)

## 2015-07-24 NOTE — Telephone Encounter (Signed)
per pof to cx lab on 12/9-pt aware

## 2015-08-21 ENCOUNTER — Other Ambulatory Visit (HOSPITAL_BASED_OUTPATIENT_CLINIC_OR_DEPARTMENT_OTHER): Payer: Medicare Other

## 2015-08-21 ENCOUNTER — Ambulatory Visit (HOSPITAL_BASED_OUTPATIENT_CLINIC_OR_DEPARTMENT_OTHER): Payer: Medicare Other

## 2015-08-21 VITALS — BP 148/80 | HR 57 | Temp 98.1°F

## 2015-08-21 DIAGNOSIS — C7B02 Secondary carcinoid tumors of liver: Secondary | ICD-10-CM

## 2015-08-21 DIAGNOSIS — C7B09 Secondary carcinoid tumors of other sites: Secondary | ICD-10-CM | POA: Diagnosis not present

## 2015-08-21 DIAGNOSIS — D500 Iron deficiency anemia secondary to blood loss (chronic): Secondary | ICD-10-CM

## 2015-08-21 DIAGNOSIS — C7A021 Malignant carcinoid tumor of the cecum: Secondary | ICD-10-CM

## 2015-08-21 DIAGNOSIS — C7B00 Secondary carcinoid tumors, unspecified site: Secondary | ICD-10-CM

## 2015-08-21 LAB — CBC & DIFF AND RETIC
BASO%: 0.9 % (ref 0.0–2.0)
BASOS ABS: 0.1 10*3/uL (ref 0.0–0.1)
EOS ABS: 0.2 10*3/uL (ref 0.0–0.5)
EOS%: 3.2 % (ref 0.0–7.0)
HEMATOCRIT: 40 % (ref 38.4–49.9)
HEMOGLOBIN: 13.4 g/dL (ref 13.0–17.1)
Immature Retic Fract: 3.4 % (ref 3.00–10.60)
LYMPH#: 1.2 10*3/uL (ref 0.9–3.3)
LYMPH%: 16.8 % (ref 14.0–49.0)
MCH: 31.6 pg (ref 27.2–33.4)
MCHC: 33.5 g/dL (ref 32.0–36.0)
MCV: 94.3 fL (ref 79.3–98.0)
MONO#: 0.7 10*3/uL (ref 0.1–0.9)
MONO%: 10.4 % (ref 0.0–14.0)
NEUT#: 4.8 10*3/uL (ref 1.5–6.5)
NEUT%: 68.7 % (ref 39.0–75.0)
Platelets: 210 10*3/uL (ref 140–400)
RBC: 4.24 10*6/uL (ref 4.20–5.82)
RDW: 13.1 % (ref 11.0–14.6)
RETIC %: 0.95 % (ref 0.80–1.80)
RETIC CT ABS: 40.28 10*3/uL (ref 34.80–93.90)
WBC: 6.9 10*3/uL (ref 4.0–10.3)

## 2015-08-21 LAB — COMPREHENSIVE METABOLIC PANEL (CC13)
ALT: 9 U/L (ref 0–55)
AST: 15 U/L (ref 5–34)
Albumin: 3.3 g/dL — ABNORMAL LOW (ref 3.5–5.0)
Alkaline Phosphatase: 90 U/L (ref 40–150)
Anion Gap: 7 meq/L (ref 3–11)
BUN: 17.7 mg/dL (ref 7.0–26.0)
CO2: 28 meq/L (ref 22–29)
Calcium: 8.9 mg/dL (ref 8.4–10.4)
Chloride: 106 meq/L (ref 98–109)
Creatinine: 0.8 mg/dL (ref 0.7–1.3)
EGFR: >90 ml/min/1.73 m2
Glucose: 86 mg/dL (ref 70–140)
Potassium: 3.8 meq/L (ref 3.5–5.1)
Sodium: 141 meq/L (ref 136–145)
Total Bilirubin: 0.37 mg/dL (ref 0.20–1.20)
Total Protein: 6.1 g/dL — ABNORMAL LOW (ref 6.4–8.3)

## 2015-08-21 LAB — IRON AND TIBC CHCC
%SAT: 39 % (ref 20–55)
Iron: 119 ug/dL (ref 42–163)
TIBC: 301 ug/dL (ref 202–409)
UIBC: 182 ug/dL (ref 117–376)

## 2015-08-21 LAB — FERRITIN CHCC: Ferritin: 30 ng/ml (ref 22–316)

## 2015-08-21 MED ORDER — OCTREOTIDE ACETATE 30 MG IM KIT
30.0000 mg | PACK | Freq: Once | INTRAMUSCULAR | Status: AC
  Administered 2015-08-21: 30 mg via INTRAMUSCULAR
  Filled 2015-08-21: qty 1

## 2015-08-26 LAB — CHROMOGRANIN A: Chromogranin A: 43 ng/mL — ABNORMAL HIGH (ref <=15)

## 2015-09-09 ENCOUNTER — Encounter (HOSPITAL_COMMUNITY): Payer: Self-pay

## 2015-09-09 ENCOUNTER — Ambulatory Visit (HOSPITAL_COMMUNITY)
Admission: RE | Admit: 2015-09-09 | Discharge: 2015-09-09 | Disposition: A | Discharge status: Home / Self Care | Payer: Medicare Other | Source: Ambulatory Visit | Attending: Hematology | Admitting: Hematology

## 2015-09-09 DIAGNOSIS — R590 Localized enlarged lymph nodes: Secondary | ICD-10-CM | POA: Diagnosis not present

## 2015-09-09 DIAGNOSIS — M799 Soft tissue disorder, unspecified: Secondary | ICD-10-CM | POA: Diagnosis not present

## 2015-09-09 DIAGNOSIS — K769 Liver disease, unspecified: Secondary | ICD-10-CM | POA: Diagnosis not present

## 2015-09-09 DIAGNOSIS — I81 Portal vein thrombosis: Secondary | ICD-10-CM | POA: Diagnosis not present

## 2015-09-09 DIAGNOSIS — C7A029 Malignant carcinoid tumor of the large intestine, unspecified portion: Secondary | ICD-10-CM

## 2015-09-09 MED ORDER — IOHEXOL 300 MG/ML  SOLN
100.0000 mL | Freq: Once | INTRAMUSCULAR | Status: AC | PRN
  Administered 2015-09-09: 90 mL via INTRAVENOUS

## 2015-09-11 ENCOUNTER — Other Ambulatory Visit: Payer: Medicare Other

## 2015-09-18 ENCOUNTER — Telehealth: Payer: Self-pay | Admitting: Hematology

## 2015-09-18 ENCOUNTER — Encounter: Payer: Self-pay | Admitting: Hematology

## 2015-09-18 ENCOUNTER — Telehealth: Payer: Self-pay | Admitting: *Deleted

## 2015-09-18 ENCOUNTER — Ambulatory Visit (HOSPITAL_BASED_OUTPATIENT_CLINIC_OR_DEPARTMENT_OTHER): Payer: Medicare Other

## 2015-09-18 ENCOUNTER — Ambulatory Visit (HOSPITAL_BASED_OUTPATIENT_CLINIC_OR_DEPARTMENT_OTHER): Payer: Medicare Other | Admitting: Hematology

## 2015-09-18 VITALS — BP 167/82 | HR 55 | Temp 97.9°F | Resp 17 | Ht 74.0 in | Wt 152.8 lb

## 2015-09-18 DIAGNOSIS — C7A029 Malignant carcinoid tumor of the large intestine, unspecified portion: Secondary | ICD-10-CM

## 2015-09-18 DIAGNOSIS — C7A021 Malignant carcinoid tumor of the cecum: Secondary | ICD-10-CM | POA: Diagnosis not present

## 2015-09-18 DIAGNOSIS — D500 Iron deficiency anemia secondary to blood loss (chronic): Secondary | ICD-10-CM

## 2015-09-18 DIAGNOSIS — C7B02 Secondary carcinoid tumors of liver: Secondary | ICD-10-CM | POA: Diagnosis not present

## 2015-09-18 DIAGNOSIS — E46 Unspecified protein-calorie malnutrition: Secondary | ICD-10-CM

## 2015-09-18 DIAGNOSIS — C7B09 Secondary carcinoid tumors of other sites: Secondary | ICD-10-CM

## 2015-09-18 MED ORDER — OCTREOTIDE ACETATE 30 MG IM KIT
30.0000 mg | PACK | Freq: Once | INTRAMUSCULAR | Status: AC
  Administered 2015-09-18: 30 mg via INTRAMUSCULAR
  Filled 2015-09-18: qty 1

## 2015-09-18 NOTE — Telephone Encounter (Signed)
Gave and printed appt sched and avs for pt for Jan thru March

## 2015-09-18 NOTE — Telephone Encounter (Signed)
Left message for Ryan Cowan to see if she could look into financial assistance for sandostatin for this patient.

## 2015-09-18 NOTE — Progress Notes (Addendum)
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 Extensive confluent ileal colic adenopathy extending chronically along the superior mesenteric vein, multiple hepatic metastasis   02/18/2015 Initial Diagnosis Carcinoid tumor of colon, malignant, with liver metastasis   02/18/2015 Initial Biopsy Liver biopsy showed metastatic low-grade neuroendocrine tumor (carcinoid). The tumor is positive for CD X2, CD 56, chromogranin, and synaptophysin.   03/05/2015 -  Chemotherapy Sandostatin 30 mg IM every 4 weeks   03/06/2015 Imaging CT chest showed no evidence of metastasis in the chest. 4.4 cm ascending aortic aneurysm.   09/09/2015 Imaging Restaging CT abdomen and pelvis with contrast showed interval response of hepatic metastasis. Most are small and, some are stable. Slight interval decrease in mesenteric adenopathy and primary colon tumor.     HISTORY OF PRESENTING ILLNESS:  Ryan Cowan 69 y.o. male is here because of recently diagnosed metastatic carcinoid tumor. I saw him in the hospital initially, this is his first follow-up after his hospital discharge. He presents to the clinic with his 3 sisters.  He has been having diarrhea, fatigue and dyspnea on exertion for the past 2-3 months. His diarrhea is watery stool, no hematochezia or melena. He denies significant abdominal pain, nausea, or change of his appetite. He lost about  20 pounds in the past 3 months. No fever, chills or night sweats. He has mild chronic dry cough which has not changed lately. He presented to his primary care physician 2-3 weeks ago, had lab work done, and was found to have hemoglobin 6.7. He was sent to emergency room on 02/16/2015 and was admitted. CT of abdomen and pelvis showed a cecal mass, adenopathy, and multiple liver lesions, which is highly suspicious for metastatic colon cancer. He received 2 units of blood transfusion, IV dextran 250 mg, and underwent liver biopsy. He was discharged home after the biopsy.  He is single, no children. He was independent, but not very physically active. He lives in a trailer. He has 3 sisters and parents live in [place] area. No family history of colon cancer or other malignancy. He moved to his mother's house, and is now very close to his sisters. His mom lives in a nursing home.  He feels better lately, gained some weight. His diarrhea is unchanged. He has mild intermittent low abdominal pain after bowel movement. No other new complaints.  CURRENT THERAPY: Sandostatin 30 mg injection every 4 weeks  Interim history:  Ryan Cowan returns for follow up. He is accompanied to clinic by his sisters. He is doing very well overall. He has good appetite and energy level most of time, occasionally feels tired, had a few episodes of diarrhea, likely related to what he ate. No abdominal pain, nausea, or other symptoms. He feels well, and functional very well at home. He is concerned about out-of-pocket cost for his treatment.   MEDICAL HISTORY:  Past Medical History  Diagnosis Date  .  Liver metastases ([place])     SURGICAL HISTORY: Past Surgical History  Procedure Laterality Date  . Shoulder surgery    . Knee surgery    . Appendectomy      SOCIAL HISTORY: Social History   Social History  . Marital Status: Single    Spouse Name: N/A  . Number of Children: N/A  . Years of Education: N/A   Occupational  History  . Not on file.   Social History Main Topics  . Smoking status: Former Smoker -- 2.50 packs/day for 50 years    Quit date: 10/04/2011  . Smokeless tobacco: Not on file  . Alcohol Use: No  . Drug Use: Not on file  . Sexual Activity: Not on file   Other Topics Concern  . Not on file   Social History Narrative   Lives alone in his mother's home (in SNF)   Has #3 sisters, [name] is POA   Prior employed in [employer] and was in [employer] 4 years   Very detail oriented in conversation    FAMILY HISTORY: Family History  Problem Relation Age of Onset  . CAD Mother   . CAD Father   . Hypertension Father   . Hypertension Sister     ALLERGIES:  is allergic to codeine.  MEDICATIONS:  Current Outpatient Prescriptions  Medication Sig Dispense Refill  . feeding supplement, ENSURE ENLIVE, (ENSURE ENLIVE) LIQD Take 237 mLs by mouth 3 (three) times daily between meals. 237 mL 12  . Multiple Vitamin (MULTIVITAMIN WITH MINERALS) TABS tablet Take 1 tablet by mouth daily. Centrum Silver 50 plus    . naproxen sodium (ANAPROX) 220 MG tablet Take 220 mg by mouth 2 (two) times daily as needed (pain).    Marland Kitchen OVER THE COUNTER MEDICATION Place 1 application into both nostrils daily as needed (congestion). Vicks inhaler stick     No current facility-administered medications for this visit.   Facility-Administered Medications Ordered in Other Visits  Medication Dose Route Frequency Provider Last Rate Last Dose  . octreotide (SANDOSTATIN LAR) IM injection 30 mg  30 mg Intramuscular Once [name], MD        REVIEW OF SYSTEMS:   Constitutional: Denies fevers, chills or abnormal night sweats Eyes: Denies blurriness of vision, double vision or watery eyes Ears, nose, mouth, throat, and face: Denies mucositis or sore throat Respiratory: Denies cough, dyspnea or wheezes Cardiovascular: Denies palpitation, chest discomfort or lower extremity swelling Gastrointestinal:  Denies  nausea, heartburn or change in bowel habits Skin: Denies abnormal skin rashes Lymphatics: Denies new lymphadenopathy or easy bruising Neurological:Denies numbness, tingling or new weaknesses Behavioral/Psych: Mood is stable, no new changes  All other systems were reviewed with the patient and are negative.  PHYSICAL EXAMINATION: ECOG PERFORMANCE STATUS: 1 - Symptomatic but completely ambulatory  Filed Vitals:   09/18/15 1322  BP: 167/82  Pulse: 55  Temp: 97.9 F (36.6 C)  Resp: 17   Filed Weights   09/18/15 1322  Weight: 152 lb 12.8 oz (69.31 kg)    GENERAL:alert, no distress and comfortable SKIN: skin color, texture, turgor are normal, no rashes or significant lesions EYES: normal, conjunctiva are pink and non-injected, sclera clear OROPHARYNX:no exudate, no erythema and lips, buccal mucosa, and tongue normal  NECK: supple, thyroid normal size, non-tender, without nodularity LYMPH:  no palpable lymphadenopathy in the cervical, axillary or inguinal LUNGS: clear to auscultation and percussion with normal breathing effort HEART: regular rate & rhythm and no murmurs and no  lower extremity edema ABDOMEN:abdomen soft, non-tender and normal bowel sounds Musculoskeletal:no cyanosis of digits and no clubbing  PSYCH: alert & oriented x 3 with fluent speech NEURO: no focal motor/sensory deficits  LABORATORY DATA:  I have reviewed the data as listed CBC Latest Ref Rng 08/21/2015 05/29/2015 04/03/2015  WBC 4.0 - 10.3 10e3/uL 6.9 7.9 4.8  Hemoglobin 13.0 - 17.1 g/dL 13.4 13.3 11.5(L)  Hematocrit 38.4 - 49.9 % 40.0 39.5 36.6(L)  Platelets 140 - 400 10e3/uL 210 217 216   CMP Latest Ref Rng 08/21/2015 05/29/2015 03/09/2015  Glucose 70 - 140 mg/dl 86 113 107  BUN 7.0 - 26.0 mg/dL 17.7 20.2 19.6  Creatinine 0.7 - 1.3 mg/dL 0.8 0.8 0.7  Sodium 136 - 145 mEq/L 141 142 138  Potassium 3.5 - 5.1 mEq/L 3.8 4.1 4.4  Chloride 101 - 111 mmol/L - - -  CO2 22 - 29 mEq/L 28 28 25   Calcium 8.4 - 10.4  mg/dL 8.9 9.3 8.5  Total Protein 6.4 - 8.3 g/dL 6.1(L) 6.3(L) 5.9(L)  Total Bilirubin 0.20 - 1.20 mg/dL 0.37 0.27 0.22  Alkaline Phos 40 - 150 U/L 90 103 96  AST 5 - 34 U/L 15 14 21   ALT 0 - 55 U/L 9 12 13    Results for Ryan Cowan, Ryan Cowan (MRN [id number]) as of 06/26/2015 08:02  Ref. Range 02/16/2015 12:57 03/09/2015 11:08 05/29/2015 13:07  Iron Latest Ref Range: 42-163 ug/dL 8 (L) 652 (H) 70  UIBC Latest Ref Range: 117-376 ug/dL 339 247 244  TIBC Latest Ref Range: 202-409 ug/dL 347 899 (H) 314  %SAT Latest Ref Range: 20-55 %  73 (H) 22  Saturation Ratios Latest Ref Range: 17.9-39.5 % 2 (L)    Ferritin Latest Ref Range: 22-316 ng/ml 15 (L) 20 (L) 40  Folate Latest Ref Range: >5.9 ng/mL 27.0     Chromogranin A (<=15ng/ml) 04/03/2015: 93 05/29/2015: 43 08/21/2015: 43  24 hour urine 5-HIAA (<6.0 mg/24h) 04/03/2015: 51.3  PATHOLOGY REPORT: Diagnosis 02/18/2015 Liver, needle/core biopsy, left lobe - METASTATIC LOW GRADE NEUROENDOCRINE TUMOR (CARCINOID). - SEE COMMENT. Microscopic Comment Immunohistochemical stains are performed. The tumor is positive for CDX-2, CD56, chromogranin, and synaptophysin; it is negative for cytokeratin 20, cytokeratin 7, and TTF-1. The morphology coupled with the staining pattern is consistent with a low grade neuroendocrine tumor (carcinoid) of gastrointestinal primary source. Dr. Donato Heinz has seen this case in consultation with agreement. The findings are called to Dr. Burr Medico on 02/20/15. (RH:ds 02/20/15)   RADIOGRAPHIC STUDIES: I have personally reviewed the radiological images as listed and agreed with the findings in the report.  Ct Abdomen Pelvis W Contrast 02/16/2015   FINDINGS: Lung bases appear emphysematous but clear.  LEFT renal cyst 3.1 x 3.4 cm image 29.  Tiny nonobstructing calculus lower pole LEFT kidney image 29.  Kidneys, spleen, and adrenal glands normal.  Multiple low-attenuation lesions within the liver most consistent with for hepatic metastatic disease.   Two largest lesions are partially necrotic, 6.7 x 5.1 x 4.9 cm posterior RIGHT lobe image 14 and 2.6 x 2.4 x 2.6 cm anterior RIGHT lobe image 16.  Retrocaval adenopathy 14 mm short axis image 17.  Peripancreatic celiac node 12 mm short axis image 26.  Additionally, large soft tissue mass is seen within the mesentery beginning inferior to the pancreatic head and extending into the upper central pelvis measuring 4.7 x 4.5 x 8.8 cm likely confluent ileocolic adenopathy along the SMV.  This mass occludes the superior mesenteric vein, with portal and  splenic veins remaining patent.  Thickening of cecum and terminal ileum highly suspicious for a cecal neoplasm.  Free fluid perihepatic, perisplenic, in RIGHT gutter, and in pelvis.  Scattered atherosclerotic calcifications.  Stomach and remaining bowel loops unremarkable.  Normal appearing bladder with prostatic enlargement.  No free air or osseous metastatic lesions.  IMPRESSION: Wall thickening of the cecum and terminal ileum high suspicious for a cecal neoplasm.  Extensive confluent ileocolic adenopathy extending cranially along the superior mesenteric vein with additional mildly enlarged peripancreatic nodes.  Multiple hepatic metastases and ascites.  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   Electronically Signed   By: Lavonia Dana M.D.   On: 02/16/2015 16:11   CT chest, abdomen and pelvis w contrast 09/09/2015  IMPRESSION: 1. Mixed interval response of hepatic metastases. Most of the lesions are clearly smaller although there is a lesion in the tip of the lateral segment left liver and measures slightly larger today. 2. Slight interval decrease in central mesenteric lymphadenopathy. 3. Slight interval decrease in soft tissue thickening in the region of the ileocecal valve and cecum. 4. No substantial change in small volume ascites. 5. Superior mesenteric vein remains occluded. This likely accounts for the edema noted in the central  mesentery. ASSESSMENT & PLAN: 69 year old Caucasian male without significant past medical history  1. 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 The liver biopsy surgical past findings were reviewed with them in great details. -The natural history of carcinoid tumor was reviewed with them. It is an indolent tumor, unfortunately has metastasized to liver, it is incurable at this stage. -The goal of therapy is tumor control, and prolong his life. -CT chest was negative for pulmonary metastasis -His initial serum chromogranin was elevated at 93, has come down to 43 after Sandostatin injections. -I reviewed his restaging CT scan findings, his liver metastasis has improved overall, the bulky adenopathy is slightly improved also. -He is tolerating Sandostatin injection very well, clinically much improved. We'll continue -He has very bulky abdominal adenopathy, potentially can cause mall bowel obstruction and SMV occlusion. This was discussed with the patient and his family -Repeat CT scans in 4-6 months  2. 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 Malnutrition -Has much improved, he has gained weight back   Plan  -continue Sandostatin injection every 28 days -Repeat labs every 3 months, one week before his visit with me  -I will see him back in 3 month.   All questions were answered. The patient knows to call the clinic with any problems, questions or concerns. I spent 25 minutes counseling the patient face to face. The total time spent in the appointment was 30 minutes and more than 50% was on counseling.     [name], MD 09/18/2015 2:01 PM

## 2015-09-24 LAB — 5 HIAA, QUANTITATIVE, URINE, 24 HOUR
5-HIAA, URINE: 12.9 mg/(24.h) — AB (ref <=6.0)
VOLUME, URINE-5HIAA: 1500 mL/(24.h)

## 2015-10-15 ENCOUNTER — Telehealth: Payer: Self-pay | Admitting: Hematology

## 2015-10-15 ENCOUNTER — Other Ambulatory Visit: Payer: Self-pay | Admitting: Hematology

## 2015-10-15 NOTE — Telephone Encounter (Signed)
Called and left a message per pof to cancel labs 1/13 and 2/10 and to move other labs,aware that she can get a new schedule 1/13

## 2015-10-16 ENCOUNTER — Ambulatory Visit (HOSPITAL_BASED_OUTPATIENT_CLINIC_OR_DEPARTMENT_OTHER): Payer: Medicare Other

## 2015-10-16 ENCOUNTER — Other Ambulatory Visit: Payer: Medicare Other

## 2015-10-16 VITALS — BP 130/79 | HR 59 | Temp 98.0°F

## 2015-10-16 DIAGNOSIS — C7A021 Malignant carcinoid tumor of the cecum: Secondary | ICD-10-CM

## 2015-10-16 DIAGNOSIS — C7B02 Secondary carcinoid tumors of liver: Secondary | ICD-10-CM | POA: Diagnosis not present

## 2015-10-16 DIAGNOSIS — C7B09 Secondary carcinoid tumors of other sites: Secondary | ICD-10-CM

## 2015-10-16 DIAGNOSIS — D500 Iron deficiency anemia secondary to blood loss (chronic): Secondary | ICD-10-CM

## 2015-10-16 MED ORDER — OCTREOTIDE ACETATE 30 MG IM KIT
30.0000 mg | PACK | Freq: Once | INTRAMUSCULAR | Status: AC
  Administered 2015-10-16: 30 mg via INTRAMUSCULAR
  Filled 2015-10-16: qty 1

## 2015-11-13 ENCOUNTER — Other Ambulatory Visit: Payer: Medicare Other

## 2015-11-13 ENCOUNTER — Ambulatory Visit (HOSPITAL_BASED_OUTPATIENT_CLINIC_OR_DEPARTMENT_OTHER): Payer: Medicare Other

## 2015-11-13 VITALS — BP 145/94 | HR 60 | Temp 98.3°F

## 2015-11-13 DIAGNOSIS — C7B02 Secondary carcinoid tumors of liver: Secondary | ICD-10-CM

## 2015-11-13 DIAGNOSIS — C7A021 Malignant carcinoid tumor of the cecum: Secondary | ICD-10-CM

## 2015-11-13 DIAGNOSIS — C7B09 Secondary carcinoid tumors of other sites: Secondary | ICD-10-CM | POA: Diagnosis not present

## 2015-11-13 DIAGNOSIS — D500 Iron deficiency anemia secondary to blood loss (chronic): Secondary | ICD-10-CM

## 2015-11-13 MED ORDER — OCTREOTIDE ACETATE 30 MG IM KIT
30.0000 mg | PACK | Freq: Once | INTRAMUSCULAR | Status: AC
  Administered 2015-11-13: 30 mg via INTRAMUSCULAR
  Filled 2015-11-13: qty 1

## 2015-12-02 ENCOUNTER — Telehealth: Payer: Self-pay | Admitting: *Deleted

## 2015-12-02 NOTE — Telephone Encounter (Signed)
Oncology Nurse Navigator Documentation  Oncology Nurse Navigator Flowsheets 12/02/2015  Navigator Location [facility]  Navigator Encounter Type Telephone  Telephone Appt Confirmation/Clarification  Treatment Phase -  Barriers/Navigation Needs -Has "bad cold". Asking if he should come in on Friday for labs or not. Moved his lab appointment to Monday, March 6th to allow him to recover. Patient aware and agrees to change.  Support Groups/Services -  Time Spent with Patient (Retired) -

## 2015-12-04 ENCOUNTER — Other Ambulatory Visit: Payer: Medicare Other

## 2015-12-07 ENCOUNTER — Other Ambulatory Visit (HOSPITAL_BASED_OUTPATIENT_CLINIC_OR_DEPARTMENT_OTHER): Payer: Medicare Other

## 2015-12-07 DIAGNOSIS — C7A021 Malignant carcinoid tumor of the cecum: Secondary | ICD-10-CM

## 2015-12-07 DIAGNOSIS — C7B00 Secondary carcinoid tumors, unspecified site: Secondary | ICD-10-CM

## 2015-12-07 LAB — COMPREHENSIVE METABOLIC PANEL
ALT: 10 U/L (ref 0–55)
ANION GAP: 9 meq/L (ref 3–11)
AST: 15 U/L (ref 5–34)
Albumin: 3.4 g/dL — ABNORMAL LOW (ref 3.5–5.0)
Alkaline Phosphatase: 86 U/L (ref 40–150)
BUN: 18.4 mg/dL (ref 7.0–26.0)
CHLORIDE: 104 meq/L (ref 98–109)
CO2: 27 meq/L (ref 22–29)
CREATININE: 0.9 mg/dL (ref 0.7–1.3)
Calcium: 8.8 mg/dL (ref 8.4–10.4)
EGFR: 88 mL/min/{1.73_m2} — AB (ref >90)
GLUCOSE: 102 mg/dL (ref 70–140)
Potassium: 4 mEq/L (ref 3.5–5.1)
SODIUM: 140 meq/L (ref 136–145)
TOTAL PROTEIN: 6.4 g/dL (ref 6.4–8.3)

## 2015-12-07 LAB — IRON AND TIBC
%SAT: 23 % (ref 20–55)
IRON: 67 ug/dL (ref 42–163)
TIBC: 290 ug/dL (ref 202–409)
UIBC: 223 ug/dL (ref 117–376)

## 2015-12-07 LAB — CBC & DIFF AND RETIC
BASO%: 1.1 % (ref 0.0–2.0)
BASOS ABS: 0.1 10*3/uL (ref 0.0–0.1)
EOS ABS: 0.2 10*3/uL (ref 0.0–0.5)
EOS%: 3.5 % (ref 0.0–7.0)
HEMATOCRIT: 39.1 % (ref 38.4–49.9)
HGB: 13.3 g/dL (ref 13.0–17.1)
IMMATURE RETIC FRACT: 3 % (ref 3.00–10.60)
LYMPH#: 0.9 10*3/uL (ref 0.9–3.3)
LYMPH%: 13.1 % — ABNORMAL LOW (ref 14.0–49.0)
MCH: 31.1 pg (ref 27.2–33.4)
MCHC: 34 g/dL (ref 32.0–36.0)
MCV: 91.4 fL (ref 79.3–98.0)
MONO#: 0.9 10*3/uL (ref 0.1–0.9)
MONO%: 13.4 % (ref 0.0–14.0)
NEUT#: 4.5 10*3/uL (ref 1.5–6.5)
NEUT%: 68.9 % (ref 39.0–75.0)
PLATELETS: 233 10*3/uL (ref 140–400)
RBC: 4.28 10*6/uL (ref 4.20–5.82)
RDW: 12.7 % (ref 11.0–14.6)
RETIC %: 0.98 % (ref 0.80–1.80)
RETIC CT ABS: 41.94 10*3/uL (ref 34.80–93.90)
WBC: 6.6 10*3/uL (ref 4.0–10.3)

## 2015-12-07 LAB — FERRITIN: Ferritin: 34 ng/ml (ref 22–316)

## 2015-12-08 LAB — CHROMOGRANIN A: Chromogranin A: 8 nmol/L — ABNORMAL HIGH (ref 0–5)

## 2015-12-10 LAB — CHROMOGRANIN A (PARALLEL TESTING): CHROMOGRANIN A: 31 ng/mL — AB (ref <=15)

## 2015-12-11 ENCOUNTER — Telehealth: Payer: Self-pay | Admitting: Hematology

## 2015-12-11 ENCOUNTER — Ambulatory Visit (HOSPITAL_BASED_OUTPATIENT_CLINIC_OR_DEPARTMENT_OTHER): Payer: Medicare Other

## 2015-12-11 ENCOUNTER — Ambulatory Visit (HOSPITAL_BASED_OUTPATIENT_CLINIC_OR_DEPARTMENT_OTHER): Payer: Medicare Other | Admitting: Hematology

## 2015-12-11 ENCOUNTER — Encounter: Payer: Self-pay | Admitting: Hematology

## 2015-12-11 ENCOUNTER — Other Ambulatory Visit: Payer: Medicare Other

## 2015-12-11 VITALS — BP 150/90 | HR 63 | Temp 98.1°F | Resp 18 | Ht 74.0 in | Wt 154.2 lb

## 2015-12-11 DIAGNOSIS — C7B09 Secondary carcinoid tumors of other sites: Secondary | ICD-10-CM

## 2015-12-11 DIAGNOSIS — D509 Iron deficiency anemia, unspecified: Secondary | ICD-10-CM

## 2015-12-11 DIAGNOSIS — C7A029 Malignant carcinoid tumor of the large intestine, unspecified portion: Secondary | ICD-10-CM | POA: Diagnosis not present

## 2015-12-11 DIAGNOSIS — C7B02 Secondary carcinoid tumors of liver: Secondary | ICD-10-CM

## 2015-12-11 DIAGNOSIS — D500 Iron deficiency anemia secondary to blood loss (chronic): Secondary | ICD-10-CM

## 2015-12-11 DIAGNOSIS — E46 Unspecified protein-calorie malnutrition: Secondary | ICD-10-CM

## 2015-12-11 MED ORDER — OCTREOTIDE ACETATE 30 MG IM KIT
30.0000 mg | PACK | Freq: Once | INTRAMUSCULAR | Status: AC
  Administered 2015-12-11: 30 mg via INTRAMUSCULAR
  Filled 2015-12-11: qty 1

## 2015-12-11 NOTE — Telephone Encounter (Signed)
per pof to sch pt appt-gave pt copy of avs °

## 2015-12-11 NOTE — Progress Notes (Signed)
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 Extensive confluent ileal colic adenopathy extending chronically along the superior mesenteric vein, multiple hepatic metastasis   02/18/2015 Initial Diagnosis Carcinoid tumor of colon, malignant, with liver metastasis   02/18/2015 Initial Biopsy Liver biopsy showed metastatic low-grade neuroendocrine tumor (carcinoid). The tumor is positive for CD X2, CD 56, chromogranin, and synaptophysin.   03/05/2015 -  Chemotherapy Sandostatin 30 mg IM every 4 weeks   03/06/2015 Imaging CT chest showed no evidence of metastasis in the chest. 4.4 cm ascending aortic aneurysm.   09/09/2015 Imaging Restaging CT abdomen and pelvis with contrast showed interval response of hepatic metastasis. Most are small and, some are stable. Slight interval decrease in mesenteric adenopathy and primary colon tumor.     HISTORY OF PRESENTING ILLNESS:  Ryan Cowan 70 y.o. male is here because of recently diagnosed metastatic carcinoid tumor. I saw him in the hospital initially, this is his first follow-up after his hospital discharge. He presents to the clinic with his 3 sisters.  He has been having diarrhea, fatigue and dyspnea on exertion for the past 2-3 months. His diarrhea is watery stool, no hematochezia or melena. He denies significant abdominal pain, nausea, or change of his appetite. He lost about  20 pounds in the past 3 months. No fever, chills or night sweats. He has mild chronic dry cough which has not changed lately. He presented to his primary care physician 2-3 weeks ago, had lab work done, and was found to have hemoglobin 6.7. He was sent to emergency room on 02/16/2015 and was admitted. CT of abdomen and pelvis showed a cecal mass, adenopathy, and multiple liver lesions, which is highly suspicious for metastatic colon cancer. He received 2 units of blood transfusion, IV dextran 250 mg, and underwent liver biopsy. He was discharged home after the biopsy.  He is single, no children. He was independent, but not very physically active. He lives in a trailer. He has 3 sisters and parents live in [place] area. No family history of colon cancer or other malignancy. He moved to his mother's house, and is now very close to his sisters. His mom lives in a nursing home.  He feels better lately, gained some weight. His diarrhea is unchanged. He has mild intermittent low abdominal pain after bowel movement. No other new complaints.  CURRENT THERAPY: Sandostatin 30 mg injection every 4 weeks  Interim history:  Mr. [name] returns for follow up. He is accompanied to clinic by his sisters. He is doing very well. He denies any pain, nausea or other symptoms. He occasionally has diarrhea, no flushing. He has good appetite and eats well. He tolerates routine activities without any difficulty.  MEDICAL HISTORY:  Past Medical History  Diagnosis Date  . Liver metastases ([place])     SURGICAL HISTORY: Past Surgical History  Procedure Laterality Date  . Shoulder surgery    .  Knee surgery    . Appendectomy      SOCIAL HISTORY: Social History   Social History  . Marital Status: Single    Spouse Name: N/A  . Number of Children: N/A  . Years of Education: N/A   Occupational History  . Not on file.   Social History Main Topics  . Smoking status: Former Smoker -- 2.50 packs/day for 50 years     Quit date: 10/04/2011  . Smokeless tobacco: Not on file  . Alcohol Use: No  . Drug Use: Not on file  . Sexual Activity: Not on file   Other Topics Concern  . Not on file   Social History Narrative   Lives alone in his mother's home (in SNF)   Has #3 sisters, [name] is POA   Prior employed in [employer] and was in [employer] 4 years   Very detail oriented in conversation    FAMILY HISTORY: Family History  Problem Relation Age of Onset  . CAD Mother   . CAD Father   . Hypertension Father   . Hypertension Sister     ALLERGIES:  is allergic to codeine.  MEDICATIONS:  Current Outpatient Prescriptions  Medication Sig Dispense Refill  . feeding supplement, ENSURE ENLIVE, (ENSURE ENLIVE) LIQD Take 237 mLs by mouth 3 (three) times daily between meals. 237 mL 12  . Multiple Vitamin (MULTIVITAMIN WITH MINERALS) TABS tablet Take 1 tablet by mouth daily. Centrum Silver 50 plus    . naproxen sodium (ANAPROX) 220 MG tablet Take 220 mg by mouth 2 (two) times daily as needed (pain).    Marland Kitchen OVER THE COUNTER MEDICATION Place 1 application into both nostrils daily as needed (congestion). Vicks inhaler stick     No current facility-administered medications for this visit.    REVIEW OF SYSTEMS:   Constitutional: Denies fevers, chills or abnormal night sweats Eyes: Denies blurriness of vision, double vision or watery eyes Ears, nose, mouth, throat, and face: Denies mucositis or sore throat Respiratory: Denies cough, dyspnea or wheezes Cardiovascular: Denies palpitation, chest discomfort or lower extremity swelling Gastrointestinal:  Denies nausea, heartburn or change in bowel habits Skin: Denies abnormal skin rashes Lymphatics: Denies new lymphadenopathy or easy bruising Neurological:Denies numbness, tingling or new weaknesses Behavioral/Psych: Mood is stable, no new changes  All other systems were reviewed with the patient and are negative.  PHYSICAL EXAMINATION: ECOG  PERFORMANCE STATUS: 1 - Symptomatic but completely ambulatory  Filed Vitals:   12/11/15 1341  BP: 150/90  Pulse: 63  Temp: 98.1 F (36.7 C)  Resp: 18   Filed Weights   12/11/15 1341  Weight: 154 lb 3.2 oz (69.945 kg)    GENERAL:alert, no distress and comfortable SKIN: skin color, texture, turgor are normal, no rashes or significant lesions EYES: normal, conjunctiva are pink and non-injected, sclera clear OROPHARYNX:no exudate, no erythema and lips, buccal mucosa, and tongue normal  NECK: supple, thyroid normal size, non-tender, without nodularity LYMPH:  no palpable lymphadenopathy in the cervical, axillary or inguinal LUNGS: clear to auscultation and percussion with normal breathing effort HEART: regular rate & rhythm and no murmurs and no lower extremity edema ABDOMEN:abdomen soft, non-tender and normal bowel sounds, no organomegaly Musculoskeletal:no cyanosis of digits and no clubbing  PSYCH: alert & oriented x 3 with fluent speech NEURO: no focal motor/sensory deficits  LABORATORY DATA:  I have reviewed the data as listed CBC Latest Ref Rng 12/07/2015 08/21/2015 05/29/2015  WBC 4.0 - 10.3 10e3/uL 6.6 6.9 7.9  Hemoglobin  13.0 - 17.1 g/dL 13.3 13.4 13.3  Hematocrit 38.4 - 49.9 % 39.1 40.0 39.5  Platelets 140 - 400 10e3/uL 233 210 217   CMP Latest Ref Rng 12/07/2015 08/21/2015 05/29/2015  Glucose 70 - 140 mg/dl 102 86 113  BUN 7.0 - 26.0 mg/dL 18.4 17.7 20.2  Creatinine 0.7 - 1.3 mg/dL 0.9 0.8 0.8  Sodium 136 - 145 mEq/L 140 141 142  Potassium 3.5 - 5.1 mEq/L 4.0 3.8 4.1  CO2 22 - 29 mEq/L 27 28 28   Calcium 8.4 - 10.4 mg/dL 8.8 8.9 9.3  Total Protein 6.4 - 8.3 g/dL 6.4 6.1(L) 6.3(L)  Total Bilirubin 0.20 - 1.20 mg/dL <0.30 0.37 0.27  Alkaline Phos 40 - 150 U/L 86 90 103  AST 5 - 34 U/L 15 15 14   ALT 0 - 55 U/L 10 9 12     Chromogranin A (<=15ng/ml) 04/03/2015: 93 05/29/2015: 43 08/21/2015: 43 12/07/2015: 8  24 hour urine 5-HIAA (<6.0 mg/24h) 04/03/2015: 51.3 09/17/2016:  12.9  PATHOLOGY REPORT: Diagnosis 02/18/2015 Liver, needle/core biopsy, left lobe - METASTATIC LOW GRADE NEUROENDOCRINE TUMOR (CARCINOID). - SEE COMMENT. Microscopic Comment Immunohistochemical stains are performed. The tumor is positive for CDX-2, CD56, chromogranin, and synaptophysin; it is negative for cytokeratin 20, cytokeratin 7, and TTF-1. The morphology coupled with the staining pattern is consistent with a low grade neuroendocrine tumor (carcinoid) of gastrointestinal primary source. Dr. Donato Heinz has seen this case in consultation with agreement. The findings are called to Dr. Burr Medico on 02/20/15. (RH:ds 02/20/15)   RADIOGRAPHIC STUDIES: I have personally reviewed the radiological images as listed and agreed with the findings in the report.  Ct Abdomen Pelvis W Contrast 02/16/2015   FINDINGS: Lung bases appear emphysematous but clear.  LEFT renal cyst 3.1 x 3.4 cm image 29.  Tiny nonobstructing calculus lower pole LEFT kidney image 29.  Kidneys, spleen, and adrenal glands normal.  Multiple low-attenuation lesions within the liver most consistent with for hepatic metastatic disease.  Two largest lesions are partially necrotic, 6.7 x 5.1 x 4.9 cm posterior RIGHT lobe image 14 and 2.6 x 2.4 x 2.6 cm anterior RIGHT lobe image 16.  Retrocaval adenopathy 14 mm short axis image 17.  Peripancreatic celiac node 12 mm short axis image 26.  Additionally, large soft tissue mass is seen within the mesentery beginning inferior to the pancreatic head and extending into the upper central pelvis measuring 4.7 x 4.5 x 8.8 cm likely confluent ileocolic adenopathy along the SMV.  This mass occludes the superior mesenteric vein, with portal and splenic veins remaining patent.  Thickening of cecum and terminal ileum highly suspicious for a cecal neoplasm.  Free fluid perihepatic, perisplenic, in RIGHT gutter, and in pelvis.  Scattered atherosclerotic calcifications.  Stomach and remaining bowel loops unremarkable.  Normal  appearing bladder with prostatic enlargement.  No free air or osseous metastatic lesions.  IMPRESSION: Wall thickening of the cecum and terminal ileum high suspicious for a cecal neoplasm.  Extensive confluent ileocolic adenopathy extending cranially along the superior mesenteric vein with additional mildly enlarged peripancreatic nodes.  Multiple hepatic metastases and ascites.  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   Electronically Signed   By: Lavonia Dana M.D.   On: 02/16/2015 16:11   CT chest, abdomen and pelvis w contrast 09/09/2015  IMPRESSION: 1. Mixed interval response of hepatic metastases. Most of the lesions are clearly smaller although there is a lesion in the tip of the lateral segment left liver and measures slightly larger today. 2.  Slight interval decrease in central mesenteric lymphadenopathy. 3. Slight interval decrease in soft tissue thickening in the region of the ileocecal valve and cecum. 4. No substantial change in small volume ascites. 5. Superior mesenteric vein remains occluded. This likely accounts for the edema noted in the central mesentery.   ASSESSMENT & PLAN: 70 year old Caucasian male without significant past medical history  1. 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 The liver biopsy surgical past findings were reviewed with them in great details. -The natural history of carcinoid tumor was reviewed with them. It is an indolent tumor, unfortunately has metastasized to liver, it is incurable at this stage. -The goal of therapy is tumor control, and prolong his life. -he is clinically doing very Well, asymptomat. His initial serum chromogranin was elevated at 93, has come down to 8 last week after Sandostatin injections, consistent with good response  -His restaging CT scan in 09/2015 showed that liver metastasis has  improved overall, the bulky adenopathy is slightly improved also. -He has very bulky abdominal adenopathy, potentially can cause mall bowel obstruction and SMV occlusion. This was discussed with the patient and his family -continue Sandostatin monthly -Repeat CT scans in 3 months -We also discussed about the new treatment option radio-isotopes therapy for metastatic neuroendocrine tumors, which is available at [facility] now.  2. 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   3. Malnutrition -Has much improved, he has gained weight back   Plan  -continue Sandostatin injection every 28 days -Repeat labs every 3 months, one week before his visit with me  -I will see him back in 3 month with restaging CT   All questions were answered. The patient knows to call the clinic with any problems, questions or concerns. I spent 25 minutes counseling the patient face to face. The total time spent in the appointment was 30 minutes and more than 50% was on counseling.     [name], MD 12/11/2015 8:19 AM

## 2015-12-17 LAB — 5 HIAA, QUANTITATIVE, URINE, 24 HOUR
5-HIAA, Urine: 8.5 mg/L
5-HIAA,QUANT.,24 HR URINE: 23 mg/(24.h) — AB (ref 0.0–14.9)

## 2016-01-08 ENCOUNTER — Ambulatory Visit (HOSPITAL_BASED_OUTPATIENT_CLINIC_OR_DEPARTMENT_OTHER): Payer: Medicare Other

## 2016-01-08 VITALS — BP 155/76 | HR 54 | Temp 97.9°F

## 2016-01-08 DIAGNOSIS — D500 Iron deficiency anemia secondary to blood loss (chronic): Secondary | ICD-10-CM

## 2016-01-08 DIAGNOSIS — C7B02 Secondary carcinoid tumors of liver: Secondary | ICD-10-CM

## 2016-01-08 DIAGNOSIS — C7B09 Secondary carcinoid tumors of other sites: Secondary | ICD-10-CM | POA: Diagnosis not present

## 2016-01-08 DIAGNOSIS — C7A029 Malignant carcinoid tumor of the large intestine, unspecified portion: Secondary | ICD-10-CM

## 2016-01-08 MED ORDER — OCTREOTIDE ACETATE 30 MG IM KIT
30.0000 mg | PACK | Freq: Once | INTRAMUSCULAR | Status: AC
  Administered 2016-01-08: 30 mg via INTRAMUSCULAR
  Filled 2016-01-08: qty 1

## 2016-02-05 ENCOUNTER — Ambulatory Visit (HOSPITAL_BASED_OUTPATIENT_CLINIC_OR_DEPARTMENT_OTHER): Payer: Medicare Other

## 2016-02-05 VITALS — BP 145/76 | HR 82 | Temp 98.1°F | Resp 20

## 2016-02-05 DIAGNOSIS — C7B09 Secondary carcinoid tumors of other sites: Secondary | ICD-10-CM

## 2016-02-05 DIAGNOSIS — D500 Iron deficiency anemia secondary to blood loss (chronic): Secondary | ICD-10-CM

## 2016-02-05 DIAGNOSIS — C7A029 Malignant carcinoid tumor of the large intestine, unspecified portion: Secondary | ICD-10-CM

## 2016-02-05 DIAGNOSIS — C7B02 Secondary carcinoid tumors of liver: Secondary | ICD-10-CM

## 2016-02-05 MED ORDER — OCTREOTIDE ACETATE 30 MG IM KIT
30.0000 mg | PACK | Freq: Once | INTRAMUSCULAR | Status: AC
  Administered 2016-02-05: 30 mg via INTRAMUSCULAR
  Filled 2016-02-05: qty 1

## 2016-02-05 NOTE — Patient Instructions (Signed)

## 2016-03-04 ENCOUNTER — Ambulatory Visit: Payer: Medicare Other

## 2016-03-04 ENCOUNTER — Other Ambulatory Visit (HOSPITAL_BASED_OUTPATIENT_CLINIC_OR_DEPARTMENT_OTHER): Payer: Medicare Other

## 2016-03-04 VITALS — BP 157/88 | HR 58 | Temp 97.7°F | Resp 16

## 2016-03-04 DIAGNOSIS — C7B00 Secondary carcinoid tumors, unspecified site: Secondary | ICD-10-CM

## 2016-03-04 DIAGNOSIS — C7A029 Malignant carcinoid tumor of the large intestine, unspecified portion: Secondary | ICD-10-CM | POA: Diagnosis not present

## 2016-03-04 DIAGNOSIS — D500 Iron deficiency anemia secondary to blood loss (chronic): Secondary | ICD-10-CM

## 2016-03-04 LAB — COMPREHENSIVE METABOLIC PANEL
ALBUMIN: 3.6 g/dL (ref 3.5–5.0)
ALK PHOS: 79 U/L (ref 40–150)
ALT: <9 U/L (ref 0–55)
ANION GAP: 9 meq/L (ref 3–11)
AST: 14 U/L (ref 5–34)
BUN: 22.1 mg/dL (ref 7.0–26.0)
CALCIUM: 8.7 mg/dL (ref 8.4–10.4)
CO2: 25 mEq/L (ref 22–29)
Chloride: 106 mEq/L (ref 98–109)
Creatinine: 0.9 mg/dL (ref 0.7–1.3)
EGFR: 86 mL/min/{1.73_m2} — AB (ref >90)
Glucose: 159 mg/dl — ABNORMAL HIGH (ref 70–140)
POTASSIUM: 3.8 meq/L (ref 3.5–5.1)
Sodium: 140 mEq/L (ref 136–145)
Total Bilirubin: 0.43 mg/dL (ref 0.20–1.20)
Total Protein: 6.3 g/dL — ABNORMAL LOW (ref 6.4–8.3)

## 2016-03-04 LAB — CBC & DIFF AND RETIC
BASO%: 1 % (ref 0.0–2.0)
Basophils Absolute: 0.1 10*3/uL (ref 0.0–0.1)
EOS ABS: 0.2 10*3/uL (ref 0.0–0.5)
EOS%: 2.7 % (ref 0.0–7.0)
HCT: 39.1 % (ref 38.4–49.9)
HEMOGLOBIN: 13.1 g/dL (ref 13.0–17.1)
Immature Retic Fract: 2.5 % — ABNORMAL LOW (ref 3.00–10.60)
LYMPH%: 16.6 % (ref 14.0–49.0)
MCH: 31.2 pg (ref 27.2–33.4)
MCHC: 33.5 g/dL (ref 32.0–36.0)
MCV: 93.1 fL (ref 79.3–98.0)
MONO#: 0.6 10*3/uL (ref 0.1–0.9)
MONO%: 10.9 % (ref 0.0–14.0)
NEUT%: 68.8 % (ref 39.0–75.0)
NEUTROS ABS: 4.1 10*3/uL (ref 1.5–6.5)
Platelets: 184 10*3/uL (ref 140–400)
RBC: 4.2 10*6/uL (ref 4.20–5.82)
RDW: 13 % (ref 11.0–14.6)
RETIC %: 0.65 % — AB (ref 0.80–1.80)
Retic Ct Abs: 27.3 10*3/uL — ABNORMAL LOW (ref 34.80–93.90)
WBC: 5.9 10*3/uL (ref 4.0–10.3)
lymph#: 1 10*3/uL (ref 0.9–3.3)

## 2016-03-04 LAB — IRON AND TIBC
%SAT: 30 % (ref 20–55)
IRON: 89 ug/dL (ref 42–163)
TIBC: 298 ug/dL (ref 202–409)
UIBC: 208 ug/dL (ref 117–376)

## 2016-03-04 LAB — FERRITIN: FERRITIN: 20 ng/mL — AB (ref 22–316)

## 2016-03-04 MED ORDER — OCTREOTIDE ACETATE 30 MG IM KIT
30.0000 mg | PACK | Freq: Once | INTRAMUSCULAR | Status: AC
  Administered 2016-03-04: 30 mg via INTRAMUSCULAR
  Filled 2016-03-04: qty 1

## 2016-03-04 NOTE — Patient Instructions (Signed)

## 2016-03-07 LAB — CHROMOGRANIN A: Chromogranin A: 11 nmol/L — ABNORMAL HIGH (ref 0–5)

## 2016-03-08 LAB — CHROMOGRANIN A (PARALLEL TESTING): CHROMOGRANIN A: 35 ng/mL — AB (ref <=15)

## 2016-03-11 ENCOUNTER — Encounter: Payer: Self-pay | Admitting: Hematology

## 2016-03-11 ENCOUNTER — Ambulatory Visit (HOSPITAL_BASED_OUTPATIENT_CLINIC_OR_DEPARTMENT_OTHER): Payer: Medicare Other | Admitting: Hematology

## 2016-03-11 ENCOUNTER — Telehealth: Payer: Self-pay | Admitting: Hematology

## 2016-03-11 VITALS — BP 156/91 | HR 70 | Temp 98.2°F | Resp 17 | Ht 74.0 in | Wt 153.3 lb

## 2016-03-11 DIAGNOSIS — D500 Iron deficiency anemia secondary to blood loss (chronic): Secondary | ICD-10-CM

## 2016-03-11 DIAGNOSIS — D508 Other iron deficiency anemias: Secondary | ICD-10-CM | POA: Diagnosis not present

## 2016-03-11 DIAGNOSIS — C7B09 Secondary carcinoid tumors of other sites: Secondary | ICD-10-CM

## 2016-03-11 DIAGNOSIS — C7A029 Malignant carcinoid tumor of the large intestine, unspecified portion: Secondary | ICD-10-CM | POA: Diagnosis not present

## 2016-03-11 DIAGNOSIS — C7B02 Secondary carcinoid tumors of liver: Secondary | ICD-10-CM

## 2016-03-11 NOTE — Progress Notes (Signed)
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 Extensive confluent ileal colic adenopathy extending chronically along the superior mesenteric vein, multiple hepatic metastasis   02/18/2015 Initial Diagnosis Carcinoid tumor of colon, malignant, with liver metastasis   02/18/2015 Initial Biopsy Liver biopsy showed metastatic low-grade neuroendocrine tumor (carcinoid). The tumor is positive for CD X2, CD 56, chromogranin, and synaptophysin.   03/05/2015 -  Chemotherapy Sandostatin 30 mg IM every 4 weeks   03/06/2015 Imaging CT chest showed no evidence of metastasis in the chest. 4.4 cm ascending aortic aneurysm.   09/09/2015 Imaging Restaging CT abdomen and pelvis with contrast showed interval response of hepatic metastasis. Most are small and, some are stable. Slight interval decrease in mesenteric adenopathy and primary colon tumor.     HISTORY OF PRESENTING ILLNESS:  Ryan Cowan 70 y.o. male is here because of recently diagnosed metastatic carcinoid tumor. I saw him in the hospital initially, this is his first follow-up after his hospital discharge. He presents to the clinic with his 3 sisters.  He has been having diarrhea, fatigue and dyspnea on exertion for the past 2-3 months. His diarrhea is watery stool, no hematochezia or melena. He denies significant abdominal pain, nausea, or change of his appetite. He lost about  20 pounds in the past 3 months. No fever, chills or night sweats. He has mild chronic dry cough which has not changed lately. He presented to his primary care physician 2-3 weeks ago, had lab work done, and was found to have hemoglobin 6.7. He was sent to emergency room on 02/16/2015 and was admitted. CT of abdomen and pelvis showed a cecal mass, adenopathy, and multiple liver lesions, which is highly suspicious for metastatic colon cancer. He received 2 units of blood transfusion, IV dextran 250 mg, and underwent liver biopsy. He was discharged home after the biopsy.  He is single, no children. He was independent, but not very physically active. He lives in a trailer. He has 3 sisters and parents live in [place] area. No family history of colon cancer or other malignancy. He moved to his mother's house, and is now very close to his sisters. His mom lives in a nursing home.  He feels better lately, gained some weight. His diarrhea is unchanged. He has mild intermittent low abdominal pain after bowel movement. No other new complaints.  CURRENT THERAPY: Sandostatin 30 mg injection every 4 weeks  Interim history:  Mr. Montante returns for follow up. He is accompanied to clinic by his two sisters. He is doing very well. He has 2-3 times bowel movement a day, most are formed, occasional loose stool. No abdominal pain, nausea, or other complaints. He has good appetite, eating well, has good energy, lives independently and functions very well. His weight is stable.  MEDICAL HISTORY:  Past Medical History  Diagnosis Date  . Liver metastases ([place])     SURGICAL HISTORY:  Past Surgical History  Procedure Laterality Date  . Shoulder surgery    . Knee surgery    . Appendectomy      SOCIAL HISTORY: Social History   Social History  . Marital Status: Single    Spouse Name: N/A  . Number of Children: N/A  . Years of Education: N/A   Occupational History  . Not on file.   Social History Main Topics    . Smoking status: Former Smoker -- 2.50 packs/day for 50 years    Quit date: 10/04/2011  . Smokeless tobacco: Not on file  . Alcohol Use: No  . Drug Use: Not on file  . Sexual Activity: Not on file   Other Topics Concern  . Not on file   Social History Narrative   Lives alone in his mother's home (in SNF)   Has #3 sisters, [name] is POA   Prior employed in [employer] and was in [employer] 4 years   Very detail oriented in conversation    FAMILY HISTORY: Family History  Problem Relation Age of Onset  . CAD Mother   . CAD Father   . Hypertension Father   . Hypertension Sister     ALLERGIES:  is allergic to codeine.  MEDICATIONS:  Current Outpatient Prescriptions  Medication Sig Dispense Refill  . Multiple Vitamin (MULTIVITAMIN WITH MINERALS) TABS tablet Take 1 tablet by mouth daily. Centrum Silver 50 plus    . naproxen sodium (ANAPROX) 220 MG tablet Take 220 mg by mouth 2 (two) times daily as needed (pain).    Marland Kitchen OVER THE COUNTER MEDICATION Place 1 application into both nostrils daily as needed (congestion). Vicks inhaler stick     No current facility-administered medications for this visit.    REVIEW OF SYSTEMS:   Constitutional: Denies fevers, chills or abnormal night sweats Eyes: Denies blurriness of vision, double vision or watery eyes Ears, nose, mouth, throat, and face: Denies mucositis or sore throat Respiratory: Denies cough, dyspnea or wheezes Cardiovascular: Denies palpitation, chest discomfort or lower extremity swelling Gastrointestinal:  Denies nausea, heartburn or change in bowel habits Skin: Denies abnormal skin rashes Lymphatics: Denies new lymphadenopathy or easy bruising Neurological:Denies numbness, tingling or new weaknesses Behavioral/Psych: Mood is stable, no new changes  All other systems were reviewed with the patient and are negative.  PHYSICAL EXAMINATION: ECOG PERFORMANCE STATUS: 0  Filed Vitals:   03/11/16 1249 03/11/16  1250  BP: 168/102 156/91  Pulse: 70   Temp: 98.2 F (36.8 C)   Resp: 17    Filed Weights   03/11/16 1249  Weight: 153 lb 4.8 oz (69.536 kg)    GENERAL:alert, no distress and comfortable SKIN: skin color, texture, turgor are normal, no rashes or significant lesions EYES: normal, conjunctiva are pink and non-injected, sclera clear OROPHARYNX:no exudate, no erythema and lips, buccal mucosa, and tongue normal  NECK: supple, thyroid normal size, non-tender, without nodularity LYMPH:  no palpable lymphadenopathy in the cervical, axillary or inguinal LUNGS: clear to auscultation and percussion with normal breathing effort HEART: regular rate & rhythm and no murmurs and no lower extremity edema ABDOMEN:abdomen soft, non-tender and normal bowel sounds, no organomegaly Musculoskeletal:no cyanosis of digits and no clubbing  PSYCH: alert & oriented x 3 with fluent speech NEURO: no focal motor/sensory deficits  LABORATORY DATA:  I have reviewed the data as listed CBC Latest Ref Rng 03/04/2016 12/07/2015 08/21/2015  WBC 4.0 - 10.3 10e3/uL 5.9 6.6 6.9  Hemoglobin 13.0 - 17.1 g/dL 13.1 13.3  13.4  Hematocrit 38.4 - 49.9 % 39.1 39.1 40.0  Platelets 140 - 400 10e3/uL 184 233 210   CMP Latest Ref Rng 03/04/2016 12/07/2015 08/21/2015  Glucose 70 - 140 mg/dl 159(H) 102 86  BUN 7.0 - 26.0 mg/dL 22.1 18.4 17.7  Creatinine 0.7 - 1.3 mg/dL 0.9 0.9 0.8  Sodium 136 - 145 mEq/L 140 140 141  Potassium 3.5 - 5.1 mEq/L 3.8 4.0 3.8  CO2 22 - 29 mEq/L 25 27 28   Calcium 8.4 - 10.4 mg/dL 8.7 8.8 8.9  Total Protein 6.4 - 8.3 g/dL 6.3(L) 6.4 6.1(L)  Total Bilirubin 0.20 - 1.20 mg/dL 0.43 <0.30 0.37  Alkaline Phos 40 - 150 U/L 79 86 90  AST 5 - 34 U/L 14 15 15   ALT 0 - 55 U/L 9 10 9     Chromogranin A (<=15ng/ml) 04/03/2015: 93 05/29/2015: 43 08/21/2015: 43 12/07/2015: 8 03/04/2016: 11  24 hour urine 5-HIAA (<6.0 mg/24h) 04/03/2015: 51.3 09/17/2016: 12.9 12/11/2015: 23  PATHOLOGY REPORT: Diagnosis  02/18/2015 Liver, needle/core biopsy, left lobe - METASTATIC LOW GRADE NEUROENDOCRINE TUMOR (CARCINOID). - SEE COMMENT. Microscopic Comment Immunohistochemical stains are performed. The tumor is positive for CDX-2, CD56, chromogranin, and synaptophysin; it is negative for cytokeratin 20, cytokeratin 7, and TTF-1. The morphology coupled with the staining pattern is consistent with a low grade neuroendocrine tumor (carcinoid) of gastrointestinal primary source. Dr. Donato Heinz has seen this case in consultation with agreement. The findings are called to Dr. Burr Medico on 02/20/15. (RH:ds 02/20/15)   RADIOGRAPHIC STUDIES: I have personally reviewed the radiological images as listed and agreed with the findings in the report.  Ct Abdomen Pelvis W Contrast 02/16/2015   FINDINGS: Lung bases appear emphysematous but clear.  LEFT renal cyst 3.1 x 3.4 cm image 29.  Tiny nonobstructing calculus lower pole LEFT kidney image 29.  Kidneys, spleen, and adrenal glands normal.  Multiple low-attenuation lesions within the liver most consistent with for hepatic metastatic disease.  Two largest lesions are partially necrotic, 6.7 x 5.1 x 4.9 cm posterior RIGHT lobe image 14 and 2.6 x 2.4 x 2.6 cm anterior RIGHT lobe image 16.  Retrocaval adenopathy 14 mm short axis image 17.  Peripancreatic celiac node 12 mm short axis image 26.  Additionally, large soft tissue mass is seen within the mesentery beginning inferior to the pancreatic head and extending into the upper central pelvis measuring 4.7 x 4.5 x 8.8 cm likely confluent ileocolic adenopathy along the SMV.  This mass occludes the superior mesenteric vein, with portal and splenic veins remaining patent.  Thickening of cecum and terminal ileum highly suspicious for a cecal neoplasm.  Free fluid perihepatic, perisplenic, in RIGHT gutter, and in pelvis.  Scattered atherosclerotic calcifications.  Stomach and remaining bowel loops unremarkable.  Normal appearing bladder with prostatic  enlargement.  No free air or osseous metastatic lesions.  IMPRESSION: Wall thickening of the cecum and terminal ileum high suspicious for a cecal neoplasm.  Extensive confluent ileocolic adenopathy extending cranially along the superior mesenteric vein with additional mildly enlarged peripancreatic nodes.  Multiple hepatic metastases and ascites.  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   Electronically Signed   By: Lavonia Dana M.D.   On: 02/16/2015 16:11   CT chest, abdomen and pelvis w contrast 09/09/2015  IMPRESSION: 1. Mixed interval response of hepatic metastases. Most of the lesions are clearly smaller although there is a lesion in the tip of the lateral segment left liver and measures slightly larger today. 2. Slight interval  decrease in central mesenteric lymphadenopathy. 3. Slight interval decrease in soft tissue thickening in the region of the ileocecal valve and cecum. 4. No substantial change in small volume ascites. 5. Superior mesenteric vein remains occluded. This likely accounts for the edema noted in the central mesentery.   ASSESSMENT & PLAN: 70 year old Caucasian male without significant past medical history  1. 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 The liver biopsy surgical past findings were reviewed with them in great details. -The natural history of carcinoid tumor was reviewed with them. It is an indolent tumor, unfortunately has metastasized to liver, it is incurable at this stage. -The goal of therapy is tumor control, and prolong his life. -His restaging CT scan in 09/2015 showed that liver metastasis has improved overall, the bulky adenopathy is slightly improved also. -He has very bulky abdominal adenopathy, potentially can cause mall bowel obstruction and SMV occlusion. This was discussed with the patient and his  family --he is clinically doing very Well, asymptomat. His initial serum chromogranin was elevated at 93, has come down to 11 last week after Sandostatin injections, consistent with good response continue Sandostatin monthly -Repeat CT scans in 1 month  -We also discussed about the new treatment option radio-isotopes therapy for metastatic neuroendocrine tumors, which is available at [facility] now.  2. 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  -Repeat iron studies normal in July 2017. He is not on oral iron supplement.  3. Malnutrition -Has much improved, he has gained weight back   Plan  -continue Sandostatin injection every 28 days -Repeat labs every 3 months, one week before his visit with me  - restaging CT with next injection in 3 weeks, he will call me after his scan to discuss scan findings  -I'll see him back in 3 months, sooner if needed.  All questions were answered. The patient knows to call the clinic with any problems, questions or concerns. I spent 15 minutes counseling the patient face to face. The total time spent in the appointment was 20 minutes and more than 50% was on counseling.     [name], MD 03/11/2016 1:27 PM

## 2016-03-11 NOTE — Addendum Note (Signed)
Addended by: [name] on: 03/11/2016 01:35 PM   Modules accepted: Orders

## 2016-03-11 NOTE — Telephone Encounter (Signed)
Gave and printed appt sched and avs for pt for June thru SEpt

## 2016-03-16 LAB — 5 HIAA, QUANTITATIVE, URINE, 24 HOUR
5-HIAA, URINE: 9.3 mg/L
5-HIAA,Quant.,24 Hr Urine: 19.5 mg/24 hr — ABNORMAL HIGH (ref 0.0–14.9)

## 2016-04-01 ENCOUNTER — Encounter (HOSPITAL_COMMUNITY): Payer: Self-pay

## 2016-04-01 ENCOUNTER — Ambulatory Visit (HOSPITAL_COMMUNITY)
Admission: RE | Admit: 2016-04-01 | Discharge: 2016-04-01 | Disposition: A | Discharge status: Home / Self Care | Payer: Medicare Other | Source: Ambulatory Visit | Attending: Hematology | Admitting: Hematology

## 2016-04-01 ENCOUNTER — Ambulatory Visit (HOSPITAL_BASED_OUTPATIENT_CLINIC_OR_DEPARTMENT_OTHER): Payer: Medicare Other

## 2016-04-01 VITALS — BP 165/90 | HR 54 | Temp 98.5°F | Resp 20

## 2016-04-01 DIAGNOSIS — C7A029 Malignant carcinoid tumor of the large intestine, unspecified portion: Secondary | ICD-10-CM

## 2016-04-01 DIAGNOSIS — C7B02 Secondary carcinoid tumors of liver: Secondary | ICD-10-CM | POA: Diagnosis not present

## 2016-04-01 DIAGNOSIS — C787 Secondary malignant neoplasm of liver and intrahepatic bile duct: Secondary | ICD-10-CM | POA: Diagnosis not present

## 2016-04-01 DIAGNOSIS — D500 Iron deficiency anemia secondary to blood loss (chronic): Secondary | ICD-10-CM

## 2016-04-01 DIAGNOSIS — C7B09 Secondary carcinoid tumors of other sites: Secondary | ICD-10-CM | POA: Diagnosis not present

## 2016-04-01 DIAGNOSIS — R188 Other ascites: Secondary | ICD-10-CM | POA: Diagnosis not present

## 2016-04-01 MED ORDER — IOPAMIDOL (ISOVUE-300) INJECTION 61%
100.0000 mL | Freq: Once | INTRAVENOUS | Status: AC | PRN
  Administered 2016-04-01: 100 mL via INTRAVENOUS

## 2016-04-01 MED ORDER — OCTREOTIDE ACETATE 30 MG IM KIT
30.0000 mg | PACK | Freq: Once | INTRAMUSCULAR | Status: AC
  Administered 2016-04-01: 30 mg via INTRAMUSCULAR
  Filled 2016-04-01: qty 1

## 2016-04-01 NOTE — Patient Instructions (Signed)

## 2016-04-14 ENCOUNTER — Telehealth: Payer: Self-pay | Admitting: *Deleted

## 2016-04-14 NOTE — Telephone Encounter (Signed)
  TC to pt to discuss CT scan- no answer first call, second call static.   Notes Recorded by [name], MD on 04/12/2016 at 8:44 AM please let pt know that his CT scan showed stable disease, will continue the Sandostatin injection, I'll see him back next month. Thanks.

## 2016-04-15 ENCOUNTER — Telehealth: Payer: Self-pay | Admitting: *Deleted

## 2016-04-15 NOTE — Telephone Encounter (Signed)
Reached pt & informed of stable CT report per Dr Burr Medico & reminded of f/u appt in Aug with Dr Burr Medico & inject appt sooner. Pt expressed understanding.

## 2016-04-29 ENCOUNTER — Ambulatory Visit (HOSPITAL_BASED_OUTPATIENT_CLINIC_OR_DEPARTMENT_OTHER): Payer: Medicare Other

## 2016-04-29 VITALS — BP 160/90 | HR 67 | Temp 98.3°F | Resp 18

## 2016-04-29 DIAGNOSIS — C7B02 Secondary carcinoid tumors of liver: Secondary | ICD-10-CM | POA: Diagnosis not present

## 2016-04-29 DIAGNOSIS — C7A029 Malignant carcinoid tumor of the large intestine, unspecified portion: Secondary | ICD-10-CM | POA: Diagnosis not present

## 2016-04-29 DIAGNOSIS — C7B09 Secondary carcinoid tumors of other sites: Secondary | ICD-10-CM | POA: Diagnosis not present

## 2016-04-29 DIAGNOSIS — D500 Iron deficiency anemia secondary to blood loss (chronic): Secondary | ICD-10-CM

## 2016-04-29 MED ORDER — OCTREOTIDE ACETATE 30 MG IM KIT
30.0000 mg | PACK | Freq: Once | INTRAMUSCULAR | Status: AC
  Administered 2016-04-29: 30 mg via INTRAMUSCULAR
  Filled 2016-04-29: qty 1

## 2016-04-29 NOTE — Patient Instructions (Signed)

## 2016-05-27 ENCOUNTER — Other Ambulatory Visit (HOSPITAL_BASED_OUTPATIENT_CLINIC_OR_DEPARTMENT_OTHER): Payer: Medicare Other

## 2016-05-27 ENCOUNTER — Telehealth: Payer: Self-pay | Admitting: Hematology

## 2016-05-27 ENCOUNTER — Ambulatory Visit (HOSPITAL_BASED_OUTPATIENT_CLINIC_OR_DEPARTMENT_OTHER): Payer: Medicare Other

## 2016-05-27 ENCOUNTER — Encounter: Payer: Self-pay | Admitting: Hematology

## 2016-05-27 ENCOUNTER — Ambulatory Visit (HOSPITAL_BASED_OUTPATIENT_CLINIC_OR_DEPARTMENT_OTHER): Payer: Medicare Other | Admitting: Hematology

## 2016-05-27 VITALS — BP 145/88 | HR 64 | Temp 98.4°F | Resp 17 | Ht 74.0 in | Wt 149.5 lb

## 2016-05-27 DIAGNOSIS — C7B02 Secondary carcinoid tumors of liver: Secondary | ICD-10-CM | POA: Diagnosis not present

## 2016-05-27 DIAGNOSIS — C7B09 Secondary carcinoid tumors of other sites: Secondary | ICD-10-CM

## 2016-05-27 DIAGNOSIS — C7A029 Malignant carcinoid tumor of the large intestine, unspecified portion: Secondary | ICD-10-CM

## 2016-05-27 DIAGNOSIS — C7B00 Secondary carcinoid tumors, unspecified site: Secondary | ICD-10-CM

## 2016-05-27 DIAGNOSIS — D500 Iron deficiency anemia secondary to blood loss (chronic): Secondary | ICD-10-CM

## 2016-05-27 DIAGNOSIS — D509 Iron deficiency anemia, unspecified: Secondary | ICD-10-CM | POA: Diagnosis not present

## 2016-05-27 DIAGNOSIS — R197 Diarrhea, unspecified: Secondary | ICD-10-CM

## 2016-05-27 LAB — CBC & DIFF AND RETIC
BASO%: 0.9 % (ref 0.0–2.0)
BASOS ABS: 0.1 10*3/uL (ref 0.0–0.1)
EOS%: 2.8 % (ref 0.0–7.0)
Eosinophils Absolute: 0.2 10*3/uL (ref 0.0–0.5)
HEMATOCRIT: 40.2 % (ref 38.4–49.9)
HGB: 13.6 g/dL (ref 13.0–17.1)
Immature Retic Fract: 2.8 % — ABNORMAL LOW (ref 3.00–10.60)
LYMPH%: 20.3 % (ref 14.0–49.0)
MCH: 31.1 pg (ref 27.2–33.4)
MCHC: 33.8 g/dL (ref 32.0–36.0)
MCV: 91.8 fL (ref 79.3–98.0)
MONO#: 0.7 10*3/uL (ref 0.1–0.9)
MONO%: 12.6 % (ref 0.0–14.0)
NEUT#: 3.6 10*3/uL (ref 1.5–6.5)
NEUT%: 63.4 % (ref 39.0–75.0)
PLATELETS: 207 10*3/uL (ref 140–400)
RBC: 4.38 10*6/uL (ref 4.20–5.82)
RDW: 13.1 % (ref 11.0–14.6)
Retic %: 0.86 % (ref 0.80–1.80)
Retic Ct Abs: 37.67 10*3/uL (ref 34.80–93.90)
WBC: 5.7 10*3/uL (ref 4.0–10.3)
lymph#: 1.2 10*3/uL (ref 0.9–3.3)

## 2016-05-27 LAB — FERRITIN: Ferritin: 26 ng/ml (ref 22–316)

## 2016-05-27 LAB — COMPREHENSIVE METABOLIC PANEL
ALBUMIN: 3.4 g/dL — AB (ref 3.5–5.0)
ALK PHOS: 87 U/L (ref 40–150)
ALT: <9 U/L (ref 0–55)
AST: 16 U/L (ref 5–34)
Anion Gap: 8 mEq/L (ref 3–11)
BILIRUBIN TOTAL: 0.58 mg/dL (ref 0.20–1.20)
BUN: 21.5 mg/dL (ref 7.0–26.0)
CO2: 29 meq/L (ref 22–29)
Calcium: 9.2 mg/dL (ref 8.4–10.4)
Chloride: 107 mEq/L (ref 98–109)
Creatinine: 0.9 mg/dL (ref 0.7–1.3)
EGFR: 87 mL/min/{1.73_m2} — ABNORMAL LOW (ref >90)
GLUCOSE: 105 mg/dL (ref 70–140)
POTASSIUM: 4.1 meq/L (ref 3.5–5.1)
SODIUM: 144 meq/L (ref 136–145)
TOTAL PROTEIN: 6.3 g/dL — AB (ref 6.4–8.3)

## 2016-05-27 LAB — IRON AND TIBC
%SAT: 49 % (ref 20–55)
Iron: 148 ug/dL (ref 42–163)
TIBC: 300 ug/dL (ref 202–409)
UIBC: 152 ug/dL (ref 117–376)

## 2016-05-27 MED ORDER — OCTREOTIDE ACETATE 30 MG IM KIT
30.0000 mg | PACK | Freq: Once | INTRAMUSCULAR | Status: AC
  Administered 2016-05-27: 30 mg via INTRAMUSCULAR
  Filled 2016-05-27: qty 1

## 2016-05-27 NOTE — Patient Instructions (Signed)

## 2016-05-27 NOTE — Telephone Encounter (Signed)
Gave patient avs report and appointments for September thru November. Central radiology will call re scan - patient aware.

## 2016-05-27 NOTE — Progress Notes (Signed)
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 Extensive confluent ileal colic adenopathy extending chronically along the superior mesenteric vein, multiple hepatic metastasis      02/18/2015 Initial Diagnosis    Carcinoid tumor of colon, malignant, with liver metastasis      02/18/2015 Initial Biopsy    Liver biopsy showed metastatic low-grade neuroendocrine tumor (carcinoid). The tumor is positive for CD X2, CD 56, chromogranin, and synaptophysin.      03/05/2015 -  Chemotherapy    Sandostatin 30 mg IM every 4 weeks      03/06/2015 Imaging    CT chest showed no evidence of metastasis in the chest. 4.4 cm ascending aortic aneurysm.      09/09/2015 Imaging    Restaging CT abdomen and pelvis with contrast showed interval response of hepatic metastasis. Most are small and, some are stable. Slight interval decrease in mesenteric adenopathy and primary colon tumor.        HISTORY OF PRESENTING ILLNESS:  Ryan Cowan 70 y.o. male is here because of recently diagnosed metastatic carcinoid tumor. I saw him in the hospital initially, this is his first follow-up after his hospital discharge. He presents to the clinic with his 3 sisters.  He has been having diarrhea, fatigue and dyspnea on exertion for the past 2-3 months. His diarrhea is watery stool, no hematochezia or melena. He denies significant abdominal  pain, nausea, or change of his appetite. He lost about 20 pounds in the past 3 months. No fever, chills or night sweats. He has mild chronic dry cough which has not changed lately. He presented to his primary care physician 2-3 weeks ago, had lab work done, and was found to have hemoglobin 6.7. He was sent to emergency room on 02/16/2015 and was admitted. CT of abdomen and pelvis showed a cecal mass, adenopathy, and multiple liver lesions, which is highly suspicious for metastatic colon cancer. He received 2 units of blood transfusion, IV dextran 250 mg, and underwent liver biopsy. He was discharged home after the biopsy.  He is single, no children. He was independent, but not very physically active. He lives in a trailer. He has 3 sisters and parents live in [place] area. No family history of colon cancer or other malignancy. He moved to his mother's house, and is now very close to his sisters. His mom lives in a nursing home.  He feels better lately, gained some weight. His diarrhea is unchanged. He has mild intermittent low abdominal pain after bowel movement. No other new complaints.  CURRENT THERAPY: Sandostatin 30 mg injection every 4 weeks  Interim history:  Mr. Gronemeyer returns for follow up. He is accompanied to clinic by his two sisters. He is doing well overall. He has mild diarrhea intermittently, possibly related to diet, 1-3 BM a day, no abdominal pain, nausea or other symptoms. He has good appetite and  energy level, functions well at home. No other new complaints.  MEDICAL HISTORY:  Past Medical History:  Diagnosis Date  . Liver metastases ([place])     SURGICAL HISTORY: Past Surgical History:  Procedure Laterality Date  . APPENDECTOMY    . KNEE SURGERY    . SHOULDER SURGERY      SOCIAL HISTORY: Social History   Social History  . Marital status: Single    Spouse name: N/A  . Number of children: N/A  . Years of education: N/A   Occupational History  . Not on file.    Social History Main Topics  . Smoking status: Former Smoker    Packs/day: 2.50    Years: 50.00    Quit date: 10/04/2011  . Smokeless tobacco: Not on file  . Alcohol use No  . Drug use: Unknown  . Sexual activity: Not on file   Other Topics Concern  . Not on file   Social History Narrative   Lives alone in his mother's home (in SNF)   Has #3 sisters, [name] is POA   Prior employed in [employer] and was in [employer] 4 years   Very detail oriented in conversation    FAMILY HISTORY: Family History  Problem Relation Age of Onset  . CAD Mother   . CAD Father   . Hypertension Father   . Hypertension Sister     ALLERGIES:  is allergic to codeine.  MEDICATIONS:  Current Outpatient Prescriptions  Medication Sig Dispense Refill  . Multiple Vitamin (MULTIVITAMIN WITH MINERALS) TABS tablet Take 1 tablet by mouth daily. Centrum Silver 50 plus    . naproxen sodium (ANAPROX) 220 MG tablet Take 220 mg by mouth 2 (two) times daily as needed (pain).    Marland Kitchen OVER THE COUNTER MEDICATION Place 1 application into both nostrils daily as needed (congestion). Vicks inhaler stick     No current facility-administered medications for this visit.     REVIEW OF SYSTEMS:   Constitutional: Denies fevers, chills or abnormal night sweats Eyes: Denies blurriness of vision, double vision or watery eyes Ears, nose, mouth, throat, and face: Denies mucositis or sore throat Respiratory: Denies cough, dyspnea or wheezes Cardiovascular: Denies palpitation, chest discomfort or lower extremity swelling Gastrointestinal:  Denies nausea, heartburn or change in bowel habits Skin: Denies abnormal skin rashes Lymphatics: Denies new lymphadenopathy or easy bruising Neurological:Denies numbness, tingling or new weaknesses Behavioral/Psych: Mood is stable, no new changes  All other systems were reviewed with the patient and are negative.  PHYSICAL EXAMINATION: ECOG PERFORMANCE STATUS: 0  Vitals:    05/27/16 1301  BP: (!) 145/88  Pulse: 64  Resp: 17  Temp: 98.4 F (36.9 C)   Filed Weights   05/27/16 1301  Weight: 149 lb 8 oz (67.8 kg)    GENERAL:alert, no distress and comfortable SKIN: skin color, texture, turgor are normal, no rashes or significant lesions EYES: normal, conjunctiva are pink and non-injected, sclera clear OROPHARYNX:no exudate, no erythema and lips, buccal mucosa, and tongue normal  NECK: supple, thyroid normal size, non-tender, without nodularity LYMPH:  no palpable lymphadenopathy in the cervical, axillary or inguinal LUNGS: clear to auscultation and percussion with normal breathing effort HEART: regular rate & rhythm and no murmurs and no lower extremity edema ABDOMEN:abdomen soft, non-tender and normal bowel sounds, no organomegaly Musculoskeletal:no cyanosis of digits and no clubbing  PSYCH: alert & oriented x 3 with fluent speech NEURO: no focal motor/sensory deficits  LABORATORY DATA:  I have reviewed the  data as listed CBC Latest Ref Rng & Units 05/27/2016 03/04/2016 12/07/2015  WBC 4.0 - 10.3 10e3/uL 5.7 5.9 6.6  Hemoglobin 13.0 - 17.1 g/dL 13.6 13.1 13.3  Hematocrit 38.4 - 49.9 % 40.2 39.1 39.1  Platelets 140 - 400 10e3/uL 207 184 233   CMP Latest Ref Rng & Units 05/27/2016 03/04/2016 12/07/2015  Glucose 70 - 140 mg/dl 105 159(H) 102  BUN 7.0 - 26.0 mg/dL 21.5 22.1 18.4  Creatinine 0.7 - 1.3 mg/dL 0.9 0.9 0.9  Sodium 136 - 145 mEq/L 144 140 140  Potassium 3.5 - 5.1 mEq/L 4.1 3.8 4.0  Chloride 101 - 111 mmol/L - - -  CO2 22 - 29 mEq/L 29 25 27   Calcium 8.4 - 10.4 mg/dL 9.2 8.7 8.8  Total Protein 6.4 - 8.3 g/dL 6.3(L) 6.3(L) 6.4  Total Bilirubin 0.20 - 1.20 mg/dL 0.58 0.43 <0.30  Alkaline Phos 40 - 150 U/L 87 79 86  AST 5 - 34 U/L 16 14 15   ALT 0 - 55 U/L <9 <9 10    Chromogranin A (<=15ng/ml) 04/03/2015: 93 05/29/2015: 43 08/21/2015: 43 12/07/2015: 8 03/04/2016: 11  24 hour urine 5-HIAA (<6.0 mg/24h) 04/03/2015: 51.3 09/17/2016: 12.9 12/11/2015:  23  PATHOLOGY REPORT: Diagnosis 02/18/2015 Liver, needle/core biopsy, left lobe - METASTATIC LOW GRADE NEUROENDOCRINE TUMOR (CARCINOID). - SEE COMMENT. Microscopic Comment Immunohistochemical stains are performed. The tumor is positive for CDX-2, CD56, chromogranin, and synaptophysin; it is negative for cytokeratin 20, cytokeratin 7, and TTF-1. The morphology coupled with the staining pattern is consistent with a low grade neuroendocrine tumor (carcinoid) of gastrointestinal primary source. Dr. Donato Heinz has seen this case in consultation with agreement. The findings are called to Dr. Burr Medico on 02/20/15. (RH:ds 02/20/15)   RADIOGRAPHIC STUDIES: I have personally reviewed the radiological images as listed and agreed with the findings in the report.  Ct Abdomen Pelvis W Contrast 02/16/2015   FINDINGS: Lung bases appear emphysematous but clear.  LEFT renal cyst 3.1 x 3.4 cm image 29.  Tiny nonobstructing calculus lower pole LEFT kidney image 29.  Kidneys, spleen, and adrenal glands normal.  Multiple low-attenuation lesions within the liver most consistent with for hepatic metastatic disease.  Two largest lesions are partially necrotic, 6.7 x 5.1 x 4.9 cm posterior RIGHT lobe image 14 and 2.6 x 2.4 x 2.6 cm anterior RIGHT lobe image 16.  Retrocaval adenopathy 14 mm short axis image 17.  Peripancreatic celiac node 12 mm short axis image 26.  Additionally, large soft tissue mass is seen within the mesentery beginning inferior to the pancreatic head and extending into the upper central pelvis measuring 4.7 x 4.5 x 8.8 cm likely confluent ileocolic adenopathy along the SMV.  This mass occludes the superior mesenteric vein, with portal and splenic veins remaining patent.  Thickening of cecum and terminal ileum highly suspicious for a cecal neoplasm.  Free fluid perihepatic, perisplenic, in RIGHT gutter, and in pelvis.  Scattered atherosclerotic calcifications.  Stomach and remaining bowel loops unremarkable.  Normal  appearing bladder with prostatic enlargement.  No free air or osseous metastatic lesions.  IMPRESSION: Wall thickening of the cecum and terminal ileum high suspicious for a cecal neoplasm.  Extensive confluent ileocolic adenopathy extending cranially along the superior mesenteric vein with additional mildly enlarged peripancreatic nodes.  Multiple hepatic metastases and ascites.  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   Electronically Signed   By: Lavonia Dana M.D.   On: 02/16/2015 16:11   CT abdomen and pelvis w contrast  04/01/2016 IMPRESSION: 1. Most of the hepatic metastases have not significantly changed. The dominant lesion posteriorly in the right lobe is slightly smaller. 2. The dominant mesenteric nodal mass is not significantly changed. No progressive adenopathy. 3. Stable ascites without clear peritoneal nodularity.y.   ASSESSMENT & PLAN: 70 year old Caucasian male without significant past medical history  1. 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 The liver biopsy surgical past findings were reviewed with them in great details. -The natural history of carcinoid tumor was reviewed with them. It is an indolent tumor, unfortunately has metastasized to liver, it is incurable at this stage. -The goal of therapy is tumor control, and prolong his life. -His restaging CT scan in 09/2015 showed that liver metastasis has improved overall, the bulky adenopathy is slightly improved also. -He has very bulky abdominal adenopathy, potentially can cause mall bowel obstruction and SMV occlusion. This was discussed with the patient and his family -I discussed his restaging scan from 04/01/2016, which showed stable disease. No other new metastasis. --he is clinically doing very Well, asymptomat. His initial serum chromogranin was elevated at 93, has come down to 11 2  months ago after Sandostatin injections, consistent with good response continue Sandostatin monthly -We also discussed about the new treatment option radio-isotopes therapy for metastatic neuroendocrine tumors, which is available at [facility] now.  2. 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  -Repeated iron studies normal in June 2017. He is not on oral iron supplement.  3. Diarrhea -secondary to #1 -His diarrhea is mild and intermittent, I encouraged him to use Imodium as needed.  Plan  -continue Sandostatin injection every 28 days -I'll see him back in 3 months, sooner if needed.  All questions were answered. The patient knows to call the clinic with any problems, questions or concerns. I spent 20 minutes counseling the patient face to face. The total time spent in the appointment was 25 minutes and more than 50% was on counseling.     [name], MD 05/27/2016

## 2016-05-28 ENCOUNTER — Encounter: Payer: Self-pay | Admitting: Hematology

## 2016-05-30 LAB — CHROMOGRANIN A: CHROMOGRAN A: 10 nmol/L — AB (ref 0–5)

## 2016-06-24 ENCOUNTER — Ambulatory Visit (HOSPITAL_BASED_OUTPATIENT_CLINIC_OR_DEPARTMENT_OTHER): Payer: Medicare Other

## 2016-06-24 VITALS — BP 141/78 | HR 63 | Temp 97.8°F | Resp 20

## 2016-06-24 DIAGNOSIS — C7B02 Secondary carcinoid tumors of liver: Secondary | ICD-10-CM

## 2016-06-24 DIAGNOSIS — C7A029 Malignant carcinoid tumor of the large intestine, unspecified portion: Secondary | ICD-10-CM | POA: Diagnosis not present

## 2016-06-24 DIAGNOSIS — C7B09 Secondary carcinoid tumors of other sites: Secondary | ICD-10-CM | POA: Diagnosis not present

## 2016-06-24 DIAGNOSIS — D500 Iron deficiency anemia secondary to blood loss (chronic): Secondary | ICD-10-CM

## 2016-06-24 MED ORDER — OCTREOTIDE ACETATE 30 MG IM KIT
30.0000 mg | PACK | Freq: Once | INTRAMUSCULAR | Status: AC
  Administered 2016-06-24: 30 mg via INTRAMUSCULAR
  Filled 2016-06-24: qty 1

## 2016-06-24 NOTE — Patient Instructions (Signed)

## 2016-06-28 LAB — 5 HIAA, QUANTITATIVE, URINE, 24 HOUR
5-HIAA, Urine: 11.6 mg/L
5-HIAA,Quant.,24 Hr Urine: 23.2 mg/24 hr — ABNORMAL HIGH (ref 0.0–14.9)

## 2016-07-22 ENCOUNTER — Ambulatory Visit (HOSPITAL_BASED_OUTPATIENT_CLINIC_OR_DEPARTMENT_OTHER): Payer: Medicare Other

## 2016-07-22 VITALS — BP 140/66 | HR 58 | Temp 97.6°F | Resp 18

## 2016-07-22 DIAGNOSIS — C7B02 Secondary carcinoid tumors of liver: Secondary | ICD-10-CM

## 2016-07-22 DIAGNOSIS — C7B09 Secondary carcinoid tumors of other sites: Secondary | ICD-10-CM | POA: Diagnosis not present

## 2016-07-22 DIAGNOSIS — D500 Iron deficiency anemia secondary to blood loss (chronic): Secondary | ICD-10-CM

## 2016-07-22 DIAGNOSIS — C7A029 Malignant carcinoid tumor of the large intestine, unspecified portion: Secondary | ICD-10-CM | POA: Diagnosis not present

## 2016-07-22 MED ORDER — OCTREOTIDE ACETATE 30 MG IM KIT
30.0000 mg | PACK | Freq: Once | INTRAMUSCULAR | Status: AC
  Administered 2016-07-22: 30 mg via INTRAMUSCULAR
  Filled 2016-07-22: qty 1

## 2016-07-22 NOTE — Patient Instructions (Signed)

## 2016-08-17 ENCOUNTER — Telehealth: Payer: Self-pay | Admitting: Hematology

## 2016-08-17 NOTE — Telephone Encounter (Signed)
Appointments rescheduled Per patient request. Patient states that radiology informed patient to have appointments rescheduled to match ct scan.

## 2016-08-19 ENCOUNTER — Ambulatory Visit (HOSPITAL_COMMUNITY)
Admission: RE | Admit: 2016-08-19 | Discharge: 2016-08-19 | Disposition: A | Discharge status: Home / Self Care | Payer: Medicare Other | Source: Ambulatory Visit | Attending: Hematology | Admitting: Hematology

## 2016-08-19 ENCOUNTER — Ambulatory Visit: Payer: Medicare Other

## 2016-08-19 ENCOUNTER — Ambulatory Visit (HOSPITAL_BASED_OUTPATIENT_CLINIC_OR_DEPARTMENT_OTHER): Payer: Medicare Other

## 2016-08-19 ENCOUNTER — Other Ambulatory Visit (HOSPITAL_BASED_OUTPATIENT_CLINIC_OR_DEPARTMENT_OTHER): Payer: Medicare Other

## 2016-08-19 ENCOUNTER — Other Ambulatory Visit: Payer: Medicare Other

## 2016-08-19 VITALS — BP 172/95 | HR 52 | Temp 97.7°F | Resp 20

## 2016-08-19 DIAGNOSIS — C7A029 Malignant carcinoid tumor of the large intestine, unspecified portion: Secondary | ICD-10-CM | POA: Insufficient documentation

## 2016-08-19 DIAGNOSIS — D500 Iron deficiency anemia secondary to blood loss (chronic): Secondary | ICD-10-CM

## 2016-08-19 DIAGNOSIS — R918 Other nonspecific abnormal finding of lung field: Secondary | ICD-10-CM | POA: Insufficient documentation

## 2016-08-19 DIAGNOSIS — C7B09 Secondary carcinoid tumors of other sites: Secondary | ICD-10-CM

## 2016-08-19 DIAGNOSIS — C7B00 Secondary carcinoid tumors, unspecified site: Secondary | ICD-10-CM

## 2016-08-19 DIAGNOSIS — C7B02 Secondary carcinoid tumors of liver: Secondary | ICD-10-CM | POA: Diagnosis not present

## 2016-08-19 DIAGNOSIS — I712 Thoracic aortic aneurysm, without rupture: Secondary | ICD-10-CM | POA: Diagnosis not present

## 2016-08-19 DIAGNOSIS — C787 Secondary malignant neoplasm of liver and intrahepatic bile duct: Secondary | ICD-10-CM | POA: Insufficient documentation

## 2016-08-19 LAB — COMPREHENSIVE METABOLIC PANEL
ALT: 9 U/L (ref 0–55)
AST: 14 U/L (ref 5–34)
Albumin: 3.4 g/dL — ABNORMAL LOW (ref 3.5–5.0)
Alkaline Phosphatase: 90 U/L (ref 40–150)
Anion Gap: 9 mEq/L (ref 3–11)
BUN: 18.8 mg/dL (ref 7.0–26.0)
CO2: 27 mEq/L (ref 22–29)
Calcium: 8.9 mg/dL (ref 8.4–10.4)
Chloride: 105 mEq/L (ref 98–109)
Creatinine: 0.9 mg/dL (ref 0.7–1.3)
EGFR: 86 mL/min/{1.73_m2} — ABNORMAL LOW (ref >90)
Glucose: 102 mg/dl (ref 70–140)
Potassium: 3.5 mEq/L (ref 3.5–5.1)
Sodium: 141 mEq/L (ref 136–145)
Total Bilirubin: 0.66 mg/dL (ref 0.20–1.20)
Total Protein: 6.2 g/dL — ABNORMAL LOW (ref 6.4–8.3)

## 2016-08-19 LAB — CBC & DIFF AND RETIC
BASO%: 1.4 % (ref 0.0–2.0)
BASOS ABS: 0.1 10*3/uL (ref 0.0–0.1)
EOS ABS: 0.2 10*3/uL (ref 0.0–0.5)
EOS%: 3 % (ref 0.0–7.0)
HCT: 38.1 % — ABNORMAL LOW (ref 38.4–49.9)
HEMOGLOBIN: 13.1 g/dL (ref 13.0–17.1)
Immature Retic Fract: 5.7 % (ref 3.00–10.60)
LYMPH#: 1 10*3/uL (ref 0.9–3.3)
LYMPH%: 16.7 % (ref 14.0–49.0)
MCH: 31.4 pg (ref 27.2–33.4)
MCHC: 34.4 g/dL (ref 32.0–36.0)
MCV: 91.4 fL (ref 79.3–98.0)
MONO#: 0.8 10*3/uL (ref 0.1–0.9)
MONO%: 14.4 % — ABNORMAL HIGH (ref 0.0–14.0)
NEUT%: 64.5 % (ref 39.0–75.0)
NEUTROS ABS: 3.7 10*3/uL (ref 1.5–6.5)
NRBC: 0 % (ref 0–0)
Platelets: 195 10*3/uL (ref 140–400)
RBC: 4.17 10*6/uL — AB (ref 4.20–5.82)
RDW: 12.9 % (ref 11.0–14.6)
RETIC %: 0.74 % — AB (ref 0.80–1.80)
Retic Ct Abs: 30.86 10*3/uL — ABNORMAL LOW (ref 34.80–93.90)
WBC: 5.7 10*3/uL (ref 4.0–10.3)

## 2016-08-19 LAB — IRON AND TIBC
%SAT: 44 % (ref 20–55)
Iron: 132 ug/dL (ref 42–163)
TIBC: 301 ug/dL (ref 202–409)
UIBC: 169 ug/dL (ref 117–376)

## 2016-08-19 LAB — FERRITIN: FERRITIN: 23 ng/mL (ref 22–316)

## 2016-08-19 MED ORDER — IOPAMIDOL (ISOVUE-300) INJECTION 61%
INTRAVENOUS | Status: AC
  Filled 2016-08-19: qty 100

## 2016-08-19 MED ORDER — IOPAMIDOL (ISOVUE-300) INJECTION 61%
100.0000 mL | Freq: Once | INTRAVENOUS | Status: AC | PRN
  Administered 2016-08-19: 75 mL via INTRAVENOUS

## 2016-08-19 MED ORDER — OCTREOTIDE ACETATE 30 MG IM KIT
30.0000 mg | PACK | Freq: Once | INTRAMUSCULAR | Status: AC
Start: 2016-08-19 — End: 2016-08-19
  Administered 2016-08-19: 30 mg via INTRAMUSCULAR
  Filled 2016-08-19: qty 1

## 2016-08-21 ENCOUNTER — Telehealth: Payer: Self-pay | Admitting: Hematology

## 2016-08-21 NOTE — Telephone Encounter (Signed)
S/w pt, advised appt 11/24 moved to 12/5 @ 1pm due to md pal. Pt verbalized understanding.

## 2016-08-23 LAB — CHROMOGRANIN A: Chromogranin A: 8 nmol/L — ABNORMAL HIGH (ref 0–5)

## 2016-08-26 ENCOUNTER — Ambulatory Visit: Payer: Medicare Other | Admitting: Hematology

## 2016-09-06 ENCOUNTER — Ambulatory Visit (HOSPITAL_BASED_OUTPATIENT_CLINIC_OR_DEPARTMENT_OTHER): Payer: Medicare Other | Admitting: Hematology

## 2016-09-06 ENCOUNTER — Encounter: Payer: Self-pay | Admitting: Hematology

## 2016-09-06 ENCOUNTER — Telehealth: Payer: Self-pay | Admitting: Hematology

## 2016-09-06 VITALS — BP 140/84 | HR 63 | Temp 98.0°F | Resp 17 | Ht 74.0 in | Wt 148.5 lb

## 2016-09-06 DIAGNOSIS — C7B02 Secondary carcinoid tumors of liver: Secondary | ICD-10-CM

## 2016-09-06 DIAGNOSIS — C7B09 Secondary carcinoid tumors of other sites: Secondary | ICD-10-CM

## 2016-09-06 DIAGNOSIS — D509 Iron deficiency anemia, unspecified: Secondary | ICD-10-CM | POA: Diagnosis not present

## 2016-09-06 DIAGNOSIS — C7A029 Malignant carcinoid tumor of the large intestine, unspecified portion: Secondary | ICD-10-CM | POA: Diagnosis not present

## 2016-09-06 DIAGNOSIS — R197 Diarrhea, unspecified: Secondary | ICD-10-CM

## 2016-09-06 NOTE — Telephone Encounter (Signed)
Appointments scheduled per 09/23/16 los. A copy of the AVS report and appointment schedule was given to patient, per 09/06/16 los.

## 2016-09-06 NOTE — Progress Notes (Signed)
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 Extensive confluent ileal colic adenopathy extending chronically along the superior mesenteric vein, multiple hepatic metastasis      02/18/2015 Initial Diagnosis    Carcinoid tumor of colon, malignant, with liver metastasis      02/18/2015 Initial Biopsy    Liver biopsy showed metastatic low-grade neuroendocrine tumor (carcinoid). The tumor is positive for CD X2, CD 56, chromogranin, and synaptophysin.      03/05/2015 -  Chemotherapy    Sandostatin 30 mg IM every 4 weeks      03/06/2015 Imaging    CT chest showed no evidence of metastasis in the chest. 4.4 cm ascending aortic aneurysm.      09/09/2015 Imaging    Restaging CT abdomen and pelvis with contrast showed interval response of hepatic metastasis. Most are small and, some are stable. Slight interval decrease in mesenteric adenopathy and primary colon tumor.        HISTORY OF PRESENTING ILLNESS:  Ryan Cowan 70 y.o. male is here because of recently diagnosed metastatic carcinoid tumor. I saw him in the hospital initially, this is his first follow-up after his hospital discharge. He presents to the clinic with his 3 sisters.  He has been having diarrhea, fatigue and dyspnea on exertion for the past 2-3 months. His diarrhea is watery stool, no hematochezia or melena. He denies significant abdominal  pain, nausea, or change of his appetite. He lost about 20 pounds in the past 3 months. No fever, chills or night sweats. He has mild chronic dry cough which has not changed lately. He presented to his primary care physician 2-3 weeks ago, had lab work done, and was found to have hemoglobin 6.7. He was sent to emergency room on 02/16/2015 and was admitted. CT of abdomen and pelvis showed a cecal mass, adenopathy, and multiple liver lesions, which is highly suspicious for metastatic colon cancer. He received 2 units of blood transfusion, IV dextran 250 mg, and underwent liver biopsy. He was discharged home after the biopsy.  He is single, no children. He was independent, but not very physically active. He lives in a trailer. He has 3 sisters and parents live in [place] area. No family history of colon cancer or other malignancy. He moved to his mother's house, and is now very close to his sisters. His mom lives in a nursing home.  He feels better lately, gained some weight. His diarrhea is unchanged. He has mild intermittent low abdominal pain after bowel movement. No other new complaints.  CURRENT THERAPY: Sandostatin 30 mg injection every 4 weeks  Interim history:  Ryan Cowan returns for follow up. He is accompanied to clinic by his two sisters. He is doing well overall, has insomnia, wakes up at night, get 5-7 hours sleep at night, able to function well during day, but does feel fatigue on and off.  No significant pain, except abdominal cramps occasionally. BM is good most time, loose stool once a while, no nausea, bloating or other complains.   MEDICAL HISTORY:  Past Medical History:  Diagnosis Date  . Liver metastases ([place])     SURGICAL HISTORY: Past Surgical History:  Procedure Laterality Date  . APPENDECTOMY    . KNEE SURGERY    . SHOULDER SURGERY      SOCIAL HISTORY: Social History   Social History  . Marital status: Single    Spouse name: N/A  . Number of children: N/A  . Years  of education: N/A   Occupational History  . Not on file.   Social History Main Topics  . Smoking status: Former Smoker    Packs/day: 2.50    Years: 50.00    Quit date: 10/04/2011  . Smokeless tobacco: Never Used  . Alcohol use No  . Drug use: Unknown  . Sexual activity: Not on file   Other Topics Concern  . Not on file   Social History Narrative   Lives alone in his mother's home (in SNF)   Has #3 sisters, [name] is POA   Prior employed in [employer] and was in [employer] 4 years   Very detail oriented in conversation    FAMILY HISTORY: Family History  Problem Relation Age of Onset  . CAD Mother   . CAD Father   . Hypertension Father   . Hypertension Sister     ALLERGIES:  is allergic to codeine.  MEDICATIONS:  Current Outpatient Prescriptions  Medication Sig Dispense Refill  . Multiple Vitamin (MULTIVITAMIN WITH MINERALS) TABS tablet Take 1 tablet by mouth daily. Centrum Silver 50 plus    . naproxen sodium (ANAPROX) 220 MG tablet Take 220 mg by mouth 2 (two) times daily as needed (pain).    Marland Kitchen OVER THE COUNTER MEDICATION Place 1 application into both nostrils daily as needed (congestion). Vicks inhaler stick     No current facility-administered medications for this visit.     REVIEW OF SYSTEMS:   Constitutional: Denies fevers, chills or abnormal night sweats Eyes: Denies blurriness of vision, double vision or watery eyes Ears, nose, mouth, throat, and face: Denies mucositis or sore throat Respiratory: Denies cough, dyspnea or wheezes Cardiovascular: Denies palpitation, chest discomfort or lower extremity swelling Gastrointestinal:  Denies nausea, heartburn or change in bowel habits Skin: Denies abnormal skin rashes Lymphatics: Denies new lymphadenopathy or easy bruising Neurological:Denies numbness, tingling or new weaknesses Behavioral/Psych: Mood is stable, no new changes  All other systems were reviewed with the patient and are  negative.  PHYSICAL EXAMINATION: ECOG PERFORMANCE STATUS: 1  Vitals:   09/06/16 1302  BP: 140/84  Pulse: 63  Resp: 17  Temp: 98 F (36.7 C)   Filed Weights   09/06/16 1302  Weight: 148 lb 8 oz (67.4 kg)    GENERAL:alert, no distress and comfortable SKIN: skin color, texture, turgor are normal, no rashes or significant lesions EYES: normal, conjunctiva are pink and non-injected, sclera clear OROPHARYNX:no exudate, no erythema and lips, buccal mucosa, and tongue normal  NECK: supple, thyroid normal size, non-tender, without nodularity LYMPH:  no palpable lymphadenopathy in the cervical, axillary or inguinal LUNGS: clear to auscultation and percussion with normal breathing effort HEART: regular rate & rhythm and no murmurs and no lower extremity edema ABDOMEN:abdomen soft, non-tender and normal bowel sounds, no organomegaly Musculoskeletal:no cyanosis of digits and no clubbing  PSYCH: alert & oriented x 3 with fluent speech NEURO:  no focal motor/sensory deficits  LABORATORY DATA:  I have reviewed the data as listed CBC Latest Ref Rng & Units 08/19/2016 05/27/2016 03/04/2016  WBC 4.0 - 10.3 10e3/uL 5.7 5.7 5.9  Hemoglobin 13.0 - 17.1 g/dL 13.1 13.6 13.1  Hematocrit 38.4 - 49.9 % 38.1(L) 40.2 39.1  Platelets 140 - 400 10e3/uL 195 207 184   CMP Latest Ref Rng & Units 08/19/2016 05/27/2016 03/04/2016  Glucose 70 - 140 mg/dl 102 105 159(H)  BUN 7.0 - 26.0 mg/dL 18.8 21.5 22.1  Creatinine 0.7 - 1.3 mg/dL 0.9 0.9 0.9  Sodium 136 - 145 mEq/L 141 144 140  Potassium 3.5 - 5.1 mEq/L 3.5 4.1 3.8  Chloride 101 - 111 mmol/L - - -  CO2 22 - 29 mEq/L 27 29 25   Calcium 8.4 - 10.4 mg/dL 8.9 9.2 8.7  Total Protein 6.4 - 8.3 g/dL 6.2(L) 6.3(L) 6.3(L)  Total Bilirubin 0.20 - 1.20 mg/dL 0.66 0.58 0.43  Alkaline Phos 40 - 150 U/L 90 87 79  AST 5 - 34 U/L 14 16 14   ALT 0 - 55 U/L 9 <9 <9    Chromogranin A (<=15ng/ml) 04/03/2015: 93 05/29/2015: 43 08/21/2015: 43 12/07/2015: 8 03/04/2016:  11 08/19/2016: 8  24 hour urine 5-HIAA (<6.0 mg/24h) 04/03/2015: 51.3 09/17/2016: 12.9 12/11/2015: 23 06/24/2016: 23.2  PATHOLOGY REPORT: Diagnosis 02/18/2015 Liver, needle/core biopsy, left lobe - METASTATIC LOW GRADE NEUROENDOCRINE TUMOR (CARCINOID). - SEE COMMENT. Microscopic Comment Immunohistochemical stains are performed. The tumor is positive for CDX-2, CD56, chromogranin, and synaptophysin; it is negative for cytokeratin 20, cytokeratin 7, and TTF-1. The morphology coupled with the staining pattern is consistent with a low grade neuroendocrine tumor (carcinoid) of gastrointestinal primary source. Dr. Donato Heinz has seen this case in consultation with agreement. The findings are called to Dr. Burr Medico on 02/20/15. (RH:ds 02/20/15)   RADIOGRAPHIC STUDIES: I have personally reviewed the radiological images as listed and agreed with the findings in the report.  Ct chest, abdomen Pelvis W Contrast 08/19/2016 IMPRESSION: 1. Overall, interval progression of multifocal liver metastases. 2. No wall mass within the central mesenteric, which obstructs the superior mesenteric vein, is not significantly changed when compared with previous exam. Associated mesenteric edema and small bowel wall thickening noted in the right lower quadrant. 3. Small nonspecific nodules are unchanged when compared with previous exam. 4. Ascending thoracic aortic aneurysm measuring 4 cm. Recommend annual imaging followup by CTA or MRA. This recommendation follows 2010 ACCF/AHA/AATS/ACR/ASA/SCA/SCAI/SIR/STS/SVM Guidelines for the Diagnosis and Management of Patients with Thoracic Aortic Disease. Circulation. 2010; [id number]   ASSESSMENT & PLAN: 70 year old Caucasian male without significant past medical history  1. 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 The liver biopsy surgical past findings  were reviewed with them in great details. -The natural history of carcinoid tumor was reviewed with them. It is an indolent tumor, unfortunately has metastasized to liver, it is incurable at this stage. -The goal of therapy is tumor control, and prolong his life. -His restaging CT scan in 09/2015 showed that liver metastasis has improved overall, the bulky adenopathy is slightly improved also. -He has very bulky abdominal adenopathy, potentially can cause mall bowel obstruction and SMV occlusion. This was discussed with the patient and his family -I discussed his restaging scan from 04/01/2016, which showed stable disease. No other new metastasis. --he is clinically doing very Well, asymptomat. His initial serum chromogranin was elevated  at 35, has come down to 8 last month ago after Sandostatin injections, consistent with good response continue Sandostatin monthly -I reviewed his restaging CT scan from 08/19/2016 images with pt and his sisters, his image was reviewed in our GI tumor board last week also, due to the different timing of contrast within the CT scan was obtained, the scans have some technical difficulty for direct comparison, his liver metastasis and adenopathy overall appear to be stable, a few may be slightly increased. No other new lesions. He is clinically doing well, we'll continue Sandostatin injection for now -I plan to repeat a follow-up scan in 3 months -We also discussed about the new treatment option radio-isotopes therapy for metastatic neuroendocrine tumors, which is likely to be approved in a few months   2. 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  -Repeated iron studies normal in June 2017. He is not on oral iron supplement.  3. Diarrhea -secondary to #1, overall much improved  -His diarrhea is mild and intermittent, I  encouraged him to use Imodium as needed.  Plan  sandostatin injection on 12/15 and every 4 weeks X3 afterwards F/u in 3 months (same day of injection) Lab and CT scan one week before next f/u   All questions were answered. The patient knows to call the clinic with any problems, questions or concerns.  I spent 20 minutes counseling the patient face to face. The total time spent in the appointment was 25 minutes and more than 50% was on counseling.     [name], MD 09/06/2016

## 2016-09-09 LAB — 5 HIAA, QUANTITATIVE, URINE, 24 HOUR
5-HIAA, URINE: 8.3 mg/L
5-HIAA,Quant.,24 Hr Urine: 20.8 mg/24 hr — ABNORMAL HIGH (ref 0.0–14.9)

## 2016-09-16 ENCOUNTER — Ambulatory Visit (HOSPITAL_BASED_OUTPATIENT_CLINIC_OR_DEPARTMENT_OTHER): Payer: Medicare Other

## 2016-09-16 VITALS — BP 170/90 | HR 64 | Temp 97.5°F | Resp 18

## 2016-09-16 DIAGNOSIS — C7A029 Malignant carcinoid tumor of the large intestine, unspecified portion: Secondary | ICD-10-CM

## 2016-09-16 DIAGNOSIS — C7B02 Secondary carcinoid tumors of liver: Secondary | ICD-10-CM

## 2016-09-16 DIAGNOSIS — C7B09 Secondary carcinoid tumors of other sites: Secondary | ICD-10-CM

## 2016-09-16 DIAGNOSIS — D500 Iron deficiency anemia secondary to blood loss (chronic): Secondary | ICD-10-CM

## 2016-09-16 MED ORDER — OCTREOTIDE ACETATE 30 MG IM KIT
30.0000 mg | PACK | Freq: Once | INTRAMUSCULAR | Status: AC
  Administered 2016-09-16: 30 mg via INTRAMUSCULAR
  Filled 2016-09-16: qty 1

## 2016-09-16 NOTE — Patient Instructions (Signed)

## 2016-10-14 ENCOUNTER — Ambulatory Visit (HOSPITAL_BASED_OUTPATIENT_CLINIC_OR_DEPARTMENT_OTHER): Payer: Medicare Other

## 2016-10-14 VITALS — BP 160/75 | HR 60 | Temp 97.8°F

## 2016-10-14 DIAGNOSIS — D500 Iron deficiency anemia secondary to blood loss (chronic): Secondary | ICD-10-CM

## 2016-10-14 DIAGNOSIS — C7B02 Secondary carcinoid tumors of liver: Secondary | ICD-10-CM

## 2016-10-14 DIAGNOSIS — C7B09 Secondary carcinoid tumors of other sites: Secondary | ICD-10-CM

## 2016-10-14 DIAGNOSIS — C7A029 Malignant carcinoid tumor of the large intestine, unspecified portion: Secondary | ICD-10-CM | POA: Diagnosis not present

## 2016-10-14 MED ORDER — OCTREOTIDE ACETATE 30 MG IM KIT
30.0000 mg | PACK | Freq: Once | INTRAMUSCULAR | Status: AC
  Administered 2016-10-14: 30 mg via INTRAMUSCULAR
  Filled 2016-10-14: qty 1

## 2016-10-14 NOTE — Patient Instructions (Signed)

## 2016-11-11 ENCOUNTER — Ambulatory Visit (HOSPITAL_BASED_OUTPATIENT_CLINIC_OR_DEPARTMENT_OTHER): Payer: Medicare Other

## 2016-11-11 VITALS — BP 158/73 | HR 59 | Temp 97.4°F | Resp 20

## 2016-11-11 DIAGNOSIS — C7B02 Secondary carcinoid tumors of liver: Secondary | ICD-10-CM | POA: Diagnosis not present

## 2016-11-11 DIAGNOSIS — C7A029 Malignant carcinoid tumor of the large intestine, unspecified portion: Secondary | ICD-10-CM

## 2016-11-11 DIAGNOSIS — D500 Iron deficiency anemia secondary to blood loss (chronic): Secondary | ICD-10-CM

## 2016-11-11 DIAGNOSIS — C7B09 Secondary carcinoid tumors of other sites: Secondary | ICD-10-CM

## 2016-11-11 MED ORDER — OCTREOTIDE ACETATE 30 MG IM KIT
30.0000 mg | PACK | Freq: Once | INTRAMUSCULAR | Status: AC
  Administered 2016-11-11: 30 mg via INTRAMUSCULAR
  Filled 2016-11-11: qty 1

## 2016-11-11 NOTE — Patient Instructions (Signed)

## 2016-11-29 ENCOUNTER — Other Ambulatory Visit: Payer: Self-pay | Admitting: Hematology

## 2016-11-29 ENCOUNTER — Telehealth: Payer: Self-pay | Admitting: Hematology

## 2016-11-29 ENCOUNTER — Telehealth: Payer: Self-pay | Admitting: *Deleted

## 2016-11-29 DIAGNOSIS — C7A029 Malignant carcinoid tumor of the large intestine, unspecified portion: Secondary | ICD-10-CM

## 2016-11-29 NOTE — Telephone Encounter (Signed)
Spoke with pt for clarification of next office visit appts.  Gave pt radiology scheduling phone number to call for date and time for CT scans prior to office visit.  Pt voiced understanding.

## 2016-11-29 NOTE — Telephone Encounter (Signed)
I left a message on the VM about appointment changes and left the cancer center number to call back for question or concerns

## 2016-12-02 ENCOUNTER — Other Ambulatory Visit (HOSPITAL_BASED_OUTPATIENT_CLINIC_OR_DEPARTMENT_OTHER): Payer: Medicare Other

## 2016-12-02 DIAGNOSIS — C7A029 Malignant carcinoid tumor of the large intestine, unspecified portion: Secondary | ICD-10-CM

## 2016-12-02 DIAGNOSIS — C7B00 Secondary carcinoid tumors, unspecified site: Secondary | ICD-10-CM

## 2016-12-02 LAB — COMPREHENSIVE METABOLIC PANEL
ALT: 11 U/L (ref 0–55)
ANION GAP: 9 meq/L (ref 3–11)
AST: 14 U/L (ref 5–34)
Albumin: 3.6 g/dL (ref 3.5–5.0)
Alkaline Phosphatase: 85 U/L (ref 40–150)
BUN: 19.7 mg/dL (ref 7.0–26.0)
CHLORIDE: 105 meq/L (ref 98–109)
CO2: 27 meq/L (ref 22–29)
Calcium: 9.1 mg/dL (ref 8.4–10.4)
Creatinine: 0.8 mg/dL (ref 0.7–1.3)
EGFR: 89 mL/min/{1.73_m2} — AB (ref >90)
Glucose: 92 mg/dl (ref 70–140)
Potassium: 3.9 mEq/L (ref 3.5–5.1)
Sodium: 141 mEq/L (ref 136–145)
Total Bilirubin: 0.36 mg/dL (ref 0.20–1.20)
Total Protein: 6.2 g/dL — ABNORMAL LOW (ref 6.4–8.3)

## 2016-12-02 LAB — CBC & DIFF AND RETIC
BASO%: 1.2 % (ref 0.0–2.0)
BASOS ABS: 0.1 10*3/uL (ref 0.0–0.1)
EOS ABS: 0.1 10*3/uL (ref 0.0–0.5)
EOS%: 2.3 % (ref 0.0–7.0)
HEMATOCRIT: 40.4 % (ref 38.4–49.9)
HEMOGLOBIN: 13.6 g/dL (ref 13.0–17.1)
IMMATURE RETIC FRACT: 2.9 % — AB (ref 3.00–10.60)
LYMPH%: 15.8 % (ref 14.0–49.0)
MCH: 31.1 pg (ref 27.2–33.4)
MCHC: 33.7 g/dL (ref 32.0–36.0)
MCV: 92.4 fL (ref 79.3–98.0)
MONO#: 0.6 10*3/uL (ref 0.1–0.9)
MONO%: 10.6 % (ref 0.0–14.0)
NEUT%: 70.1 % (ref 39.0–75.0)
NEUTROS ABS: 3.6 10*3/uL (ref 1.5–6.5)
Platelets: 200 10*3/uL (ref 140–400)
RBC: 4.37 10*6/uL (ref 4.20–5.82)
RDW: 12.9 % (ref 11.0–14.6)
Retic %: 0.55 % — ABNORMAL LOW (ref 0.80–1.80)
Retic Ct Abs: 24.04 10*3/uL — ABNORMAL LOW (ref 34.80–93.90)
WBC: 5.2 10*3/uL (ref 4.0–10.3)
lymph#: 0.8 10*3/uL — ABNORMAL LOW (ref 0.9–3.3)

## 2016-12-02 LAB — IRON AND TIBC
%SAT: 42 % (ref 20–55)
IRON: 127 ug/dL (ref 42–163)
TIBC: 300 ug/dL (ref 202–409)
UIBC: 173 ug/dL (ref 117–376)

## 2016-12-02 LAB — FERRITIN: FERRITIN: 22 ng/mL (ref 22–316)

## 2016-12-05 LAB — CHROMOGRANIN A: Chromogranin A: 13 nmol/L — ABNORMAL HIGH (ref 0–5)

## 2016-12-06 ENCOUNTER — Ambulatory Visit (HOSPITAL_COMMUNITY)
Admission: RE | Admit: 2016-12-06 | Discharge: 2016-12-06 | Disposition: A | Discharge status: Home / Self Care | Payer: Medicare Other | Source: Ambulatory Visit | Attending: Hematology | Admitting: Hematology

## 2016-12-06 ENCOUNTER — Encounter (HOSPITAL_COMMUNITY): Payer: Self-pay

## 2016-12-06 DIAGNOSIS — I77810 Thoracic aortic ectasia: Secondary | ICD-10-CM | POA: Diagnosis not present

## 2016-12-06 DIAGNOSIS — C787 Secondary malignant neoplasm of liver and intrahepatic bile duct: Secondary | ICD-10-CM | POA: Insufficient documentation

## 2016-12-06 DIAGNOSIS — C7A029 Malignant carcinoid tumor of the large intestine, unspecified portion: Secondary | ICD-10-CM | POA: Diagnosis not present

## 2016-12-06 DIAGNOSIS — I700 Atherosclerosis of aorta: Secondary | ICD-10-CM | POA: Diagnosis not present

## 2016-12-06 DIAGNOSIS — I2510 Atherosclerotic heart disease of native coronary artery without angina pectoris: Secondary | ICD-10-CM | POA: Insufficient documentation

## 2016-12-06 DIAGNOSIS — R590 Localized enlarged lymph nodes: Secondary | ICD-10-CM | POA: Diagnosis not present

## 2016-12-06 DIAGNOSIS — R918 Other nonspecific abnormal finding of lung field: Secondary | ICD-10-CM | POA: Insufficient documentation

## 2016-12-06 MED ORDER — IOPAMIDOL (ISOVUE-300) INJECTION 61%
100.0000 mL | Freq: Once | INTRAVENOUS | Status: AC | PRN
Start: 2016-12-06 — End: 2016-12-06
  Administered 2016-12-06: 100 mL via INTRAVENOUS

## 2016-12-06 MED ORDER — IOPAMIDOL (ISOVUE-300) INJECTION 61%
INTRAVENOUS | Status: AC
  Filled 2016-12-06: qty 100

## 2016-12-08 LAB — 5 HIAA, QUANTITATIVE, URINE, 24 HOUR
5-HIAA, Urine: 8 mg/L
5-HIAA,Quant.,24 Hr Urine: 18.4 mg/24 hr — ABNORMAL HIGH (ref 0.0–14.9)

## 2016-12-08 NOTE — Progress Notes (Signed)
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 Extensive confluent ileal colic adenopathy extending chronically along the superior mesenteric vein, multiple hepatic metastasis      02/18/2015 Initial Diagnosis    Carcinoid tumor of colon, malignant, with liver metastasis      02/18/2015 Initial Biopsy    Liver biopsy showed metastatic low-grade neuroendocrine tumor (carcinoid). The tumor is positive for CD X2, CD 56, chromogranin, and synaptophysin.      03/05/2015 -  Chemotherapy    Sandostatin 30 mg IM every 4 weeks      03/06/2015 Imaging    CT chest showed no evidence of metastasis in the chest. 4.4 cm ascending aortic aneurysm.      09/09/2015 Imaging    Restaging CT abdomen and pelvis with contrast showed interval response of hepatic metastasis. Most are small and, some are stable. Slight interval decrease in mesenteric adenopathy and primary colon tumor.        HISTORY OF PRESENTING ILLNESS:  Ryan Cowan 71 y.o. male is here because of recently diagnosed metastatic carcinoid tumor. I saw him in the hospital initially, this is his first follow-up after his hospital discharge. He presents to the clinic with his 3 sisters.  He has been having diarrhea, fatigue and dyspnea on exertion for the past 2-3 months. His diarrhea is watery stool, no hematochezia or melena. He denies significant abdominal  pain, nausea, or change of his appetite. He lost about 20 pounds in the past 3 months. No fever, chills or night sweats. He has mild chronic dry cough which has not changed lately. He presented to his primary care physician 2-3 weeks ago, had lab work done, and was found to have hemoglobin 6.7. He was sent to emergency room on 02/16/2015 and was admitted. CT of abdomen and pelvis showed a cecal mass, adenopathy, and multiple liver lesions, which is highly suspicious for metastatic colon cancer. He received 2 units of blood transfusion, IV dextran 250 mg, and underwent liver biopsy. He was discharged home after the biopsy.  He is single, no children. He was independent, but not very physically active. He lives in a trailer. He has 3 sisters and parents live in [place] area. No family history of colon cancer or other malignancy. He moved to his mother's house, and is now very close to his sisters. His mom lives in a nursing home.  He feels better lately, gained some weight. His diarrhea is unchanged. He has mild intermittent low abdominal pain after bowel movement. No other new complaints.  CURRENT THERAPY: Sandostatin 30 mg injection every 4 weeks  Interim history:  Mr. Fielden returns for follow up. Patient is accompanied by family. He reports to feeling "hyper" this morning and more excited compared to previous days. He has pain that comes/goes in his pelvis area, lasts for 10-15 seconds.. He has noticed a knot that in  that area. He is experiencing diarrhea that he says varies depending on what he eat, happens 2-3 times a week. He does not always eat and says his energy level is lower later in the day.Marland Kitchen   He has lost 2 pounds. Tumor marker is 18, overall stable. Kidney liver function is all normal. His tumor has slowed down.   MEDICAL HISTORY:  Past Medical History:  Diagnosis Date  . Liver metastases ([place])     SURGICAL HISTORY: Past Surgical History:  Procedure Laterality Date  . APPENDECTOMY      . KNEE SURGERY    . SHOULDER SURGERY      SOCIAL HISTORY: Social History   Social History  . Marital status: Single    Spouse name: N/A  . Number of children: N/A  . Years of education: N/A   Occupational History  . Not on file.   Social History Main Topics  . Smoking status: Former Smoker    Packs/day: 2.50    Years: 50.00    Quit date: 10/04/2011  . Smokeless tobacco: Never Used  . Alcohol use No  . Drug use: Unknown  . Sexual activity: Not on file   Other Topics Concern  . Not on file   Social History Narrative   Lives alone in his mother's home (in SNF)   Has #3 sisters, [name] is POA   Prior employed in [employer] and was in [employer] 4 years   Very detail oriented in conversation    FAMILY HISTORY: Family History  Problem Relation Age of Onset  . CAD Mother   . CAD Father   . Hypertension Father   . Hypertension Sister     ALLERGIES:  is allergic to codeine.  MEDICATIONS:  Current Outpatient Prescriptions  Medication Sig Dispense Refill  . Multiple Vitamin (MULTIVITAMIN WITH MINERALS) TABS tablet Take 1 tablet by mouth daily. Centrum Silver 50 plus    . naproxen sodium (ANAPROX) 220 MG tablet Take 220 mg by mouth 2 (two) times daily as needed (pain).    Marland Kitchen OVER THE COUNTER MEDICATION Place 1 application into both nostrils daily as needed (congestion). Vicks inhaler stick     No current facility-administered medications for this visit.     REVIEW OF SYSTEMS:   Constitutional: Denies fevers, chills or abnormal night sweats Eyes: Denies blurriness of vision, double vision or watery eyes Ears, nose, mouth, throat, and face: Denies mucositis or sore throat Respiratory: Denies cough, dyspnea or wheezes Cardiovascular: Denies palpitation, chest discomfort or lower extremity swelling Gastrointestinal:  Denies nausea, heartburn or change in bowel habits Skin: Denies abnormal skin rashes Lymphatics: Denies new lymphadenopathy or easy  bruising Neurological:Denies numbness, tingling or new weaknesses Behavioral/Psych: Mood is stable, no new changes  All other systems were reviewed with the patient and are negative.  PHYSICAL EXAMINATION:  ECOG PERFORMANCE STATUS: 1  Vitals:   12/09/16 1228  BP: (!) 146/89  Pulse: 65  Resp: 18  Temp: 98 F (36.7 C)   Filed Weights   12/09/16 1228  Weight: 146 lb 8 oz (66.5 kg)    GENERAL:alert, no distress and comfortable SKIN: skin color, texture, turgor are normal, no rashes or significant lesions EYES: normal, conjunctiva are pink and non-injected, sclera clear OROPHARYNX:no exudate, no erythema and lips, buccal mucosa, and tongue normal  NECK: supple, thyroid normal size, non-tender, without nodularity LYMPH:  no palpable lymphadenopathy in the cervical, axillary or inguinal LUNGS: clear to auscultation and percussion with normal breathing effort  HEART: regular rate & rhythm and no murmurs and no lower extremity edema  ABDOMEN:abdomen soft, non-tender and normal bowel sounds, no organomegaly  Musculoskeletal:no cyanosis of digits and no clubbing  PSYCH: alert & oriented x 3 with fluent speech NEURO: no focal motor/sensory deficits  LABORATORY DATA:  I have reviewed the data as listed CBC Latest Ref Rng & Units 12/02/2016 08/19/2016 05/27/2016  WBC 4.0 - 10.3 10e3/uL 5.2 5.7 5.7  Hemoglobin 13.0 - 17.1 g/dL 13.6 13.1 13.6  Hematocrit 38.4 - 49.9 % 40.4 38.1(L) 40.2  Platelets 140 - 400 10e3/uL 200 195 207   CMP Latest Ref Rng & Units 12/02/2016 08/19/2016 05/27/2016  Glucose 70 - 140 mg/dl 92 102 105  BUN 7.0 - 26.0 mg/dL 19.7 18.8 21.5  Creatinine 0.7 - 1.3 mg/dL 0.8 0.9 0.9  Sodium 136 - 145 mEq/L 141 141 144  Potassium 3.5 - 5.1 mEq/L 3.9 3.5 4.1  Chloride 101 - 111 mmol/L - - -  CO2 22 - 29 mEq/L 27 27 29   Calcium 8.4 - 10.4 mg/dL 9.1 8.9 9.2  Total Protein 6.4 - 8.3 g/dL 6.2(L) 6.2(L) 6.3(L)  Total Bilirubin 0.20 - 1.20 mg/dL 0.36 0.66 0.58  Alkaline Phos 40 -  150 U/L 85 90 87  AST 5 - 34 U/L 14 14 16   ALT 0 - 55 U/L 11 9 <9    Chromogranin A (<=15ng/ml) 04/03/2015: 93 05/29/2015: 43 08/21/2015: 43 12/07/2015: 8 03/04/2016: 11 08/19/2016: 8 12/02/2016: 13  24 hour urine 5-HIAA (<6.0 mg/24h) 04/03/2015: 51.3 09/17/2016: 12.9 12/11/2015: 23 06/24/2016: 23.2 12/05/2016: 18.4  PATHOLOGY REPORT: Diagnosis 02/18/2015 Liver, needle/core biopsy, left lobe - METASTATIC LOW GRADE NEUROENDOCRINE TUMOR (CARCINOID). - SEE COMMENT.   RADIOGRAPHIC STUDIES: I have personally reviewed the radiological images as listed and agreed with the findings in the report.  Ct chest, abdomen Pelvis W Contrast 08/19/2016 IMPRESSION: 1. Overall, interval progression of multifocal liver metastases. 2. No wall mass within the central mesenteric, which obstructs the superior mesenteric vein, is not significantly changed when compared with previous exam. Associated mesenteric edema and small bowel wall thickening noted in the right lower quadrant. 3. Small nonspecific nodules are unchanged when compared with previous exam. 4. Ascending thoracic aortic aneurysm measuring 4 cm. Recommend annual imaging followup by CTA or MRA. This recommendation follows 2010 ACCF/AHA/AATS/ACR/ASA/SCA/SCAI/SIR/STS/SVM Guidelines for the Diagnosis and Management of Patients with Thoracic Aortic Disease. Circulation. 2010; 121: Q683-M196  Ct chest, abdomen Pelvis W Contrast 12/06/16 IMPRESSION: 1. Overall, the extent of metastatic disease to the liver and abdominal/retroperitoneal lymphatics is similar to the prior examination 08/19/2016. 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 2. Multiple small pulmonary nodules appear unchanged in size, number and distribution, favored to be benign. 3. Aortic atherosclerosis, in addition to 2 vessel coronary artery disease. Assessment for potential risk factor modification,  dietary therapy or pharmacologic therapy may be warranted, if clinically indicated. 4. In addition, there is dilatation of the aortic root (4.7 cm in diameter at the level of the sinuses of Valsalva) and ectasia of the ascending thoracic aorta measuring 4.4 cm in diameter. Recommend annual imaging followup by CTA or MRA. This recommendation follows 2010 ACCF/AHA/AATS/ACR/ASA/SCA/SCAI/SIR/STS/SVM Guidelines for the Diagnosis and Management of Patients with Thoracic Aortic Disease. Circulation. 2010; 121: Q229-N989. 5. Trace volume of ascites. 6. Additional incidental findings, as above.   ASSESSMENT & PLAN: 71 y.o.  Caucasian male without significant past medical history  1. 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 The liver biopsy surgical past findings were reviewed with them in great details. -The natural history of carcinoid tumor was reviewed with them. It is an indolent tumor, unfortunately has metastasized to liver, it is incurable at this stage. -The goal of therapy is tumor control, and prolong his life. -He has very bulky abdominal adenopathy, potentially can cause mall bowel obstruction and SMV occlusion. This was discussed with the patient and his family -I discussed his restaging scan from 12/06/2016, which showed stable disease. No other new metastasis. --he is clinically doing very Well, asymptomat. His initial serum chromogranin was previously elevated at 93, has come down to 8 in 2017, corresponding to good response, slightly increased lately, will continue monitoring.  -We also previously discussed about the new treatment option radio-isotopes therapy for metastatic neuroendocrine tumors, which has been approved by FDA in general 2018. She would be a candidate if she feels unsteady injection.  -He is not much symptomatically from his disease, he knows to use  Imodium as needed if he has diarrhea.  2. 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  -I previously repeated iron studies normal in June 2017. He is not on oral iron supplement. - I encouraged him to exercise more to help with energy level   3. Diarrhea -secondary to #1, overall much improved  -His diarrhea is mild and intermittent, I encouraged him to use Imodium as needed.  4. Possible right inguinal hernia - Patient complains of having a "knot" in th groin area. - I have dicussed with the patient and we have agreed to just continue watching the area as long as no pain is present.   Plan  - scan and lab reviewed -continue Sandostatin Injections every month   - f/u with lab in  3 months  - repeat scan in 6 months  All questions were answered. The patient knows to call the clinic with any problems, questions or concerns.  I spent 20 minutes counseling the patient face to face. The total time spent in the appointment was 25 minutes and more than 50% was on counseling.    This document serves as a record of services personally performed by [name], MD. It was created on her behalf by Brandt Loosen, a trained medical scribe. The creation of this record is based on the scribe's personal observations and the provider's statements to them. This document has been checked and approved by the attending provider.   [name], MD 12/09/2016

## 2016-12-09 ENCOUNTER — Ambulatory Visit (HOSPITAL_BASED_OUTPATIENT_CLINIC_OR_DEPARTMENT_OTHER): Payer: Medicare Other | Admitting: Hematology

## 2016-12-09 ENCOUNTER — Encounter: Payer: Self-pay | Admitting: Hematology

## 2016-12-09 ENCOUNTER — Ambulatory Visit (HOSPITAL_BASED_OUTPATIENT_CLINIC_OR_DEPARTMENT_OTHER): Payer: Medicare Other

## 2016-12-09 ENCOUNTER — Telehealth: Payer: Self-pay | Admitting: Hematology

## 2016-12-09 VITALS — BP 146/89 | HR 65 | Temp 98.0°F | Resp 18 | Ht 74.0 in | Wt 146.5 lb

## 2016-12-09 DIAGNOSIS — C7B02 Secondary carcinoid tumors of liver: Secondary | ICD-10-CM

## 2016-12-09 DIAGNOSIS — D500 Iron deficiency anemia secondary to blood loss (chronic): Secondary | ICD-10-CM

## 2016-12-09 DIAGNOSIS — C7A029 Malignant carcinoid tumor of the large intestine, unspecified portion: Secondary | ICD-10-CM

## 2016-12-09 DIAGNOSIS — R197 Diarrhea, unspecified: Secondary | ICD-10-CM

## 2016-12-09 DIAGNOSIS — C7B09 Secondary carcinoid tumors of other sites: Secondary | ICD-10-CM

## 2016-12-09 DIAGNOSIS — R198 Other specified symptoms and signs involving the digestive system and abdomen: Secondary | ICD-10-CM | POA: Diagnosis not present

## 2016-12-09 MED ORDER — OCTREOTIDE ACETATE 30 MG IM KIT
30.0000 mg | PACK | Freq: Once | INTRAMUSCULAR | Status: AC
  Administered 2016-12-09: 30 mg via INTRAMUSCULAR
  Filled 2016-12-09: qty 1

## 2016-12-09 NOTE — Telephone Encounter (Signed)
Gave patient AVS and scheduled patient per 12/09/2016 los.

## 2016-12-09 NOTE — Patient Instructions (Signed)

## 2017-01-06 ENCOUNTER — Ambulatory Visit (HOSPITAL_BASED_OUTPATIENT_CLINIC_OR_DEPARTMENT_OTHER): Payer: Medicare Other

## 2017-01-06 VITALS — BP 139/84 | HR 56 | Temp 98.0°F | Resp 18 | Wt 146.8 lb

## 2017-01-06 DIAGNOSIS — C7B09 Secondary carcinoid tumors of other sites: Secondary | ICD-10-CM

## 2017-01-06 DIAGNOSIS — C7B02 Secondary carcinoid tumors of liver: Secondary | ICD-10-CM | POA: Diagnosis not present

## 2017-01-06 DIAGNOSIS — C7A029 Malignant carcinoid tumor of the large intestine, unspecified portion: Secondary | ICD-10-CM | POA: Diagnosis not present

## 2017-01-06 DIAGNOSIS — D500 Iron deficiency anemia secondary to blood loss (chronic): Secondary | ICD-10-CM

## 2017-01-06 MED ORDER — OCTREOTIDE ACETATE 30 MG IM KIT
30.0000 mg | PACK | Freq: Once | INTRAMUSCULAR | Status: AC
  Administered 2017-01-06: 30 mg via INTRAMUSCULAR
  Filled 2017-01-06: qty 1

## 2017-01-06 NOTE — Patient Instructions (Signed)

## 2017-02-03 ENCOUNTER — Ambulatory Visit (HOSPITAL_BASED_OUTPATIENT_CLINIC_OR_DEPARTMENT_OTHER): Payer: Medicare Other

## 2017-02-03 VITALS — BP 141/73 | HR 57 | Temp 98.5°F | Resp 18

## 2017-02-03 DIAGNOSIS — C7A029 Malignant carcinoid tumor of the large intestine, unspecified portion: Secondary | ICD-10-CM

## 2017-02-03 DIAGNOSIS — C7B09 Secondary carcinoid tumors of other sites: Secondary | ICD-10-CM | POA: Diagnosis not present

## 2017-02-03 DIAGNOSIS — C7B02 Secondary carcinoid tumors of liver: Secondary | ICD-10-CM

## 2017-02-03 DIAGNOSIS — D500 Iron deficiency anemia secondary to blood loss (chronic): Secondary | ICD-10-CM

## 2017-02-03 MED ORDER — OCTREOTIDE ACETATE 30 MG IM KIT
30.0000 mg | PACK | Freq: Once | INTRAMUSCULAR | Status: AC
  Administered 2017-02-03: 30 mg via INTRAMUSCULAR
  Filled 2017-02-03: qty 1

## 2017-02-03 NOTE — Patient Instructions (Signed)

## 2017-03-03 ENCOUNTER — Other Ambulatory Visit (HOSPITAL_BASED_OUTPATIENT_CLINIC_OR_DEPARTMENT_OTHER): Payer: Medicare Other

## 2017-03-03 ENCOUNTER — Ambulatory Visit (HOSPITAL_BASED_OUTPATIENT_CLINIC_OR_DEPARTMENT_OTHER): Payer: Medicare Other

## 2017-03-03 VITALS — BP 150/60 | HR 59 | Temp 97.0°F | Resp 20

## 2017-03-03 DIAGNOSIS — C7A029 Malignant carcinoid tumor of the large intestine, unspecified portion: Secondary | ICD-10-CM | POA: Diagnosis not present

## 2017-03-03 DIAGNOSIS — C7B02 Secondary carcinoid tumors of liver: Secondary | ICD-10-CM

## 2017-03-03 DIAGNOSIS — C7B09 Secondary carcinoid tumors of other sites: Secondary | ICD-10-CM

## 2017-03-03 DIAGNOSIS — C7B00 Secondary carcinoid tumors, unspecified site: Secondary | ICD-10-CM

## 2017-03-03 DIAGNOSIS — D500 Iron deficiency anemia secondary to blood loss (chronic): Secondary | ICD-10-CM

## 2017-03-03 LAB — CBC & DIFF AND RETIC
BASO%: 1 % (ref 0.0–2.0)
BASOS ABS: 0.1 10*3/uL (ref 0.0–0.1)
EOS ABS: 0.1 10*3/uL (ref 0.0–0.5)
EOS%: 1.8 % (ref 0.0–7.0)
HCT: 38.5 % (ref 38.4–49.9)
HEMOGLOBIN: 13 g/dL (ref 13.0–17.1)
Immature Retic Fract: 1.4 % — ABNORMAL LOW (ref 3.00–10.60)
LYMPH#: 0.9 10*3/uL (ref 0.9–3.3)
LYMPH%: 17.3 % (ref 14.0–49.0)
MCH: 31.3 pg (ref 27.2–33.4)
MCHC: 33.8 g/dL (ref 32.0–36.0)
MCV: 92.5 fL (ref 79.3–98.0)
MONO#: 0.6 10*3/uL (ref 0.1–0.9)
MONO%: 11.2 % (ref 0.0–14.0)
NEUT#: 3.5 10*3/uL (ref 1.5–6.5)
NEUT%: 68.7 % (ref 39.0–75.0)
Platelets: 179 10*3/uL (ref 140–400)
RBC: 4.16 10*6/uL — ABNORMAL LOW (ref 4.20–5.82)
RDW: 12.8 % (ref 11.0–14.6)
RETIC %: 0.6 % — AB (ref 0.80–1.80)
RETIC CT ABS: 24.96 10*3/uL — AB (ref 34.80–93.90)
WBC: 5.1 10*3/uL (ref 4.0–10.3)

## 2017-03-03 LAB — COMPREHENSIVE METABOLIC PANEL
ALT: 8 U/L (ref 0–55)
AST: 12 U/L (ref 5–34)
Albumin: 3.6 g/dL (ref 3.5–5.0)
Alkaline Phosphatase: 79 U/L (ref 40–150)
Anion Gap: 6 mEq/L (ref 3–11)
BUN: 18.3 mg/dL (ref 7.0–26.0)
CHLORIDE: 106 meq/L (ref 98–109)
CO2: 28 mEq/L (ref 22–29)
Calcium: 9.1 mg/dL (ref 8.4–10.4)
Creatinine: 1 mg/dL (ref 0.7–1.3)
EGFR: 75 mL/min/{1.73_m2} — ABNORMAL LOW (ref >90)
GLUCOSE: 82 mg/dL (ref 70–140)
POTASSIUM: 3.9 meq/L (ref 3.5–5.1)
SODIUM: 140 meq/L (ref 136–145)
Total Bilirubin: 0.43 mg/dL (ref 0.20–1.20)
Total Protein: 6.1 g/dL — ABNORMAL LOW (ref 6.4–8.3)

## 2017-03-03 LAB — IRON AND TIBC
%SAT: 33 % (ref 20–55)
Iron: 98 ug/dL (ref 42–163)
TIBC: 301 ug/dL (ref 202–409)
UIBC: 203 ug/dL (ref 117–376)

## 2017-03-03 LAB — FERRITIN: Ferritin: 27 ng/ml (ref 22–316)

## 2017-03-03 MED ORDER — OCTREOTIDE ACETATE 30 MG IM KIT
30.0000 mg | PACK | Freq: Once | INTRAMUSCULAR | Status: AC
  Administered 2017-03-03: 30 mg via INTRAMUSCULAR
  Filled 2017-03-03: qty 1

## 2017-03-03 NOTE — Patient Instructions (Signed)

## 2017-03-08 LAB — CHROMOGRANIN A: CHROMOGRAN A: 15 nmol/L — AB (ref 0–5)

## 2017-03-08 LAB — 5 HIAA, QUANTITATIVE, URINE, 24 HOUR
5-HIAA, URINE: 12.9 mg/L
5-HIAA,QUANT.,24 HR URINE: 28.4 mg/(24.h) — AB (ref 0.0–14.9)

## 2017-03-09 NOTE — Progress Notes (Signed)
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 Extensive confluent ileal colic adenopathy extending chronically along the superior mesenteric vein, multiple hepatic metastasis      02/18/2015 Initial Diagnosis    Carcinoid tumor of colon, malignant, with liver metastasis      02/18/2015 Initial Biopsy    Liver biopsy showed metastatic low-grade neuroendocrine tumor (carcinoid). The tumor is positive for CD X2, CD 56, chromogranin, and synaptophysin.      03/05/2015 -  Chemotherapy    Sandostatin 30 mg IM every 4 weeks      03/06/2015 Imaging    CT chest showed no evidence of metastasis in the chest. 4.4 cm ascending aortic aneurysm.      09/09/2015 Imaging    Restaging CT abdomen and pelvis with contrast showed interval response of hepatic metastasis. Most are small and, some are stable. Slight interval decrease in mesenteric adenopathy and primary colon tumor.       12/06/2016 Imaging    CT CAP w contrast IMPRESSION: 1. Overall, the extent of metastatic disease to the liver and abdominal/retroperitoneal lymphatics is similar to the prior examination 08/19/2016. 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 2. Multiple small pulmonary nodules appear unchanged in size, number and distribution, favored to be benign. 3. Aortic  atherosclerosis, in addition to 2 vessel coronary artery disease. Assessment for potential risk factor modification, dietary therapy or pharmacologic therapy may be warranted, if clinically indicated. 4. In addition, there is dilatation of the aortic root (4.7 cm in diameter at the level of the sinuses of Valsalva) and ectasia of the ascending thoracic aorta measuring 4.4 cm in diameter. Recommend annual imaging followup by CTA or MRA. This recommendation follows 2010 ACCF/AHA/AATS/ACR/ASA/SCA/SCAI/SIR/STS/SVM Guidelines for the Diagnosis and Management of Patients with Thoracic Aortic Disease. Circulation. 2010; 121: Q222-L798. 5. Trace volume of ascites. 6. Additional incidental findings, as above.        HISTORY OF PRESENTING ILLNESS:  Ryan Cowan 71 y.o. male is here because of recently diagnosed metastatic carcinoid tumor. I saw him in the hospital initially, this is his first follow-up after his hospital discharge. He presents to the clinic with his 3 sisters.  He has been having diarrhea, fatigue and dyspnea on exertion for the past 2-3 months. His diarrhea is watery stool, no hematochezia or melena. He denies significant abdominal pain, nausea, or change of his appetite. He lost about 20 pounds in the past 3 months. No fever, chills or night sweats. He has mild chronic dry cough which has not changed lately. He presented to his primary care physician 2-3 weeks ago, had lab work done, and was found to have hemoglobin 6.7. He was sent to emergency room on 02/16/2015 and was admitted. CT of abdomen and pelvis showed a cecal mass, adenopathy, and multiple liver lesions, which  is highly suspicious for metastatic colon cancer. He received 2 units of blood transfusion, IV dextran 250 mg, and underwent liver biopsy. He was discharged home after the biopsy.  He is single, no children. He was independent, but not very physically active. He lives in a trailer. He has 3 sisters and parents live  in [place] area. No family history of colon cancer or other malignancy. He moved to his mother's house, and is now very close to his sisters. His mom lives in a nursing home.  He feels better lately, gained some weight. His diarrhea is unchanged. He has mild intermittent low abdominal pain after bowel movement. No other new complaints.  CURRENT THERAPY: Sandostatin 30 mg injection every 4 weeks  Interim history:  Ryan Cowan returns for follow up. He presents to clinic with his sister. He has been doing well overall, occasional diarrhea, no flushing or other symptoms. His appetite is slightly low, he eats 2 meals a day, he has lost some weight. His mother is not doing well well due to her dementia, he lives in his mother's house now, and is concerned that he may lose the place to live if his mother passes away. His sister feels he is under a lot of stress lately.  MEDICAL HISTORY:  Past Medical History:  Diagnosis Date  . Liver metastases ([place])     SURGICAL HISTORY: Past Surgical History:  Procedure Laterality Date  . APPENDECTOMY    . KNEE SURGERY    . SHOULDER SURGERY      SOCIAL HISTORY: Social History   Social History  . Marital status: Single    Spouse name: N/A  . Number of children: N/A  . Years of education: N/A   Occupational History  . Not on file.   Social History Main Topics  . Smoking status: Former Smoker    Packs/day: 2.50    Years: 50.00    Quit date: 10/04/2011  . Smokeless tobacco: Never Used  . Alcohol use No  . Drug use: Unknown  . Sexual activity: Not on file   Other Topics Concern  . Not on file   Social History Narrative   Lives alone in his mother's home (in SNF)   Has #3 sisters, [name] is POA   Prior employed in [employer] and was in [employer] 4 years   Very detail oriented in conversation    FAMILY HISTORY: Family History  Problem Relation Age of Onset  . CAD Mother   . CAD Father   . Hypertension Father   .  Hypertension Sister     ALLERGIES:  is allergic to codeine.  MEDICATIONS:  Current Outpatient Prescriptions  Medication Sig Dispense Refill  . Multiple Vitamin (MULTIVITAMIN WITH MINERALS) TABS tablet Take 1 tablet by mouth daily. Centrum Silver 50 plus    . naproxen sodium (ANAPROX) 220 MG tablet Take 220 mg by mouth 2 (two) times daily as needed (pain).    Marland Kitchen OVER THE COUNTER MEDICATION Place 1 application into both nostrils daily as needed (congestion). Vicks inhaler stick     No current facility-administered medications for this visit.     REVIEW OF SYSTEMS:   Constitutional: Denies fevers, chills or abnormal night sweats Eyes: Denies blurriness of vision, double vision or watery eyes Ears, nose, mouth, throat, and face: Denies mucositis or sore throat Respiratory: Denies cough, dyspnea or wheezes Cardiovascular: Denies palpitation, chest discomfort or lower extremity swelling Gastrointestinal:  Denies nausea, heartburn or change in bowel habits  Skin: Denies abnormal skin rashes Lymphatics: Denies new lymphadenopathy or easy bruising Neurological:Denies numbness, tingling or new weaknesses Behavioral/Psych: Mood is stable, no new changes  All other systems were reviewed with the patient and are negative.  PHYSICAL EXAMINATION:  ECOG PERFORMANCE STATUS: 1  Vitals:   03/10/17 1256  BP: (!) 163/89  Pulse: 68  Resp: 20  Temp: 98.4 F (36.9 C)   Filed Weights   03/10/17 1256  Weight: 142 lb 6.4 oz (64.6 kg)    GENERAL:alert, no distress and comfortable SKIN: skin color, texture, turgor are normal, no rashes or significant lesions EYES: normal, conjunctiva are pink and non-injected, sclera clear OROPHARYNX:no exudate, no erythema and lips, buccal mucosa, and tongue normal  NECK: supple, thyroid normal size, non-tender, without nodularity LYMPH:  no palpable lymphadenopathy in the cervical, axillary or inguinal LUNGS: clear to auscultation and percussion with normal  breathing effort HEART: regular rate & rhythm and no murmurs and no lower extremity edema  ABDOMEN:abdomen soft, non-tender and normal bowel sounds, no organomegaly  Musculoskeletal:no cyanosis of digits and no clubbing  PSYCH: alert & oriented x 3 with fluent speech NEURO: no focal motor/sensory deficits  LABORATORY DATA:  I have reviewed the data as listed CBC Latest Ref Rng & Units 03/03/2017 12/02/2016 08/19/2016  WBC 4.0 - 10.3 10e3/uL 5.1 5.2 5.7  Hemoglobin 13.0 - 17.1 g/dL 13.0 13.6 13.1  Hematocrit 38.4 - 49.9 % 38.5 40.4 38.1(L)  Platelets 140 - 400 10e3/uL 179 200 195   CMP Latest Ref Rng & Units 03/03/2017 12/02/2016 08/19/2016  Glucose 70 - 140 mg/dl 82 92 102  BUN 7.0 - 26.0 mg/dL 18.3 19.7 18.8  Creatinine 0.7 - 1.3 mg/dL 1.0 0.8 0.9  Sodium 136 - 145 mEq/L 140 141 141  Potassium 3.5 - 5.1 mEq/L 3.9 3.9 3.5  Chloride 101 - 111 mmol/L - - -  CO2 22 - 29 mEq/L 28 27 27   Calcium 8.4 - 10.4 mg/dL 9.1 9.1 8.9  Total Protein 6.4 - 8.3 g/dL 6.1(L) 6.2(L) 6.2(L)  Total Bilirubin 0.20 - 1.20 mg/dL 0.43 0.36 0.66  Alkaline Phos 40 - 150 U/L 79 85 90  AST 5 - 34 U/L 12 14 14   ALT 0 - 55 U/L 8 11 9     Chromogranin A (<=15ng/ml) 04/03/2015: 93 05/29/2015: 43 08/21/2015: 43 12/07/2015: 8 03/04/2016: 11 08/19/2016: 8 12/02/2016: 13 03/03/2017: 15  24 hour urine 5-HIAA (<6.0 mg/24h) 04/03/2015: 51.3 09/17/2016: 12.9 12/11/2015: 23 06/24/2016: 23.2 12/05/2016: 18.4 03/03/2017: 28.4  PATHOLOGY REPORT: Diagnosis 02/18/2015 Liver, needle/core biopsy, left lobe - METASTATIC LOW GRADE NEUROENDOCRINE TUMOR (CARCINOID). - SEE COMMENT.   RADIOGRAPHIC STUDIES: I have personally reviewed the radiological images as listed and agreed with the findings in the report.  Ct chest, abdomen Pelvis W Contrast 08/19/2016 IMPRESSION: 1. Overall, interval progression of multifocal liver metastases. 2. No wall mass within the central mesenteric, which obstructs the superior mesenteric vein, is not  significantly changed when compared with previous exam. Associated mesenteric edema and small bowel wall thickening noted in the right lower quadrant. 3. Small nonspecific nodules are unchanged when compared with previous exam. 4. Ascending thoracic aortic aneurysm measuring 4 cm. Recommend annual imaging followup by CTA or MRA. This recommendation follows 2010 ACCF/AHA/AATS/ACR/ASA/SCA/SCAI/SIR/STS/SVM Guidelines for the Diagnosis and Management of Patients with Thoracic Aortic Disease. Circulation. 2010; 121: U725-D664  Ct chest, abdomen Pelvis W Contrast 12/06/16 IMPRESSION: 1. Overall, the extent of metastatic disease to the liver and abdominal/retroperitoneal lymphatics is similar to the prior examination  08/19/2016. 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 2. Multiple small pulmonary nodules appear unchanged in size, number and distribution, favored to be benign. 3. Aortic atherosclerosis, in addition to 2 vessel coronary artery disease. Assessment for potential risk factor modification, dietary therapy or pharmacologic therapy may be warranted, if clinically indicated. 4. In addition, there is dilatation of the aortic root (4.7 cm in diameter at the level of the sinuses of Valsalva) and ectasia of the ascending thoracic aorta measuring 4.4 cm in diameter. Recommend annual imaging followup by CTA or MRA. This recommendation follows 2010 ACCF/AHA/AATS/ACR/ASA/SCA/SCAI/SIR/STS/SVM Guidelines for the Diagnosis and Management of Patients with Thoracic Aortic Disease. Circulation. 2010; 121: Z610-R604. 5. Trace volume of ascites. 6. Additional incidental findings, as above.   ASSESSMENT & PLAN: 71 y.o.  Caucasian male without significant past medical history  1. 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 The liver biopsy surgical past findings were reviewed with them in great details. -The natural history of carcinoid tumor was reviewed with them. It is an indolent tumor, unfortunately has metastasized to liver, it is incurable at this stage. -The goal of therapy is tumor control, and prolong his life. -He has very bulky abdominal adenopathy, potentially can cause mall bowel obstruction and SMV occlusion. This was discussed with the patient and his family -I previously discussed his restaging scan from 12/06/2016, which showed stable disease. No other new metastasis. --he is clinically doing very Well, asymptomat. His initial serum chromogranin was previously elevated at 93, has come down significantly, corresponding to good response. The level has slightly increased lately, will continue monitoring.  -We also previously discussed about the new treatment option radio-isotopes therapy for metastatic neuroendocrine tumors, which has been approved by FDA in general 2018. He would be a good candidate if he progresses on Sandostatin injection -He is not much symptomatically from his disease, he knows to use Imodium as needed if he has diarrhea. -Lab reviewed, he is clinically doing well, no consult for disease progression, we'll continue Sandostatin injection.  2. 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  -I previously repeated iron studies normal in June 2017. He is not on oral iron supplement. - I previously encouraged him to exercise more to help with energy level   3. Diarrhea -secondary to #1, overall much improved  -His diarrhea is mild and intermittent, I encouraged him to use Imodium as needed.  4. Possible right inguinal hernia - Patient complains of having a "knot" in th groin area. - I have previously dicussed  with the patient and we have agreed to just continue watching the area as long as no pain is present.   Plan  -Lab reviewed, he is clinically doing well. We'll continue Sandostatin injection every 4 weeks, next due in 3 weeks  -Follow up in 3 months, with lab and restaging CT scan one weekly for  All questions were answered. The patient knows to call the clinic with any problems, questions or concerns.  I spent 20 minutes counseling the patient face to face. The total time spent in the appointment was 25 minutes and more than 50% was on counseling.    This document  serves as a record of services personally performed by [name], MD. It was created on her behalf by Brandt Loosen, a trained medical scribe. The creation of this record is based on the scribe's personal observations and the provider's statements to them. This document has been checked and approved by the attending provider.   [name], MD 03/10/2017

## 2017-03-10 ENCOUNTER — Encounter: Payer: Self-pay | Admitting: Hematology

## 2017-03-10 ENCOUNTER — Telehealth: Payer: Self-pay | Admitting: Hematology

## 2017-03-10 ENCOUNTER — Ambulatory Visit (HOSPITAL_BASED_OUTPATIENT_CLINIC_OR_DEPARTMENT_OTHER): Payer: Medicare Other | Admitting: Hematology

## 2017-03-10 VITALS — BP 163/89 | HR 68 | Temp 98.4°F | Resp 20 | Ht 74.0 in | Wt 142.4 lb

## 2017-03-10 DIAGNOSIS — C787 Secondary malignant neoplasm of liver and intrahepatic bile duct: Secondary | ICD-10-CM

## 2017-03-10 DIAGNOSIS — D500 Iron deficiency anemia secondary to blood loss (chronic): Secondary | ICD-10-CM

## 2017-03-10 DIAGNOSIS — C7B09 Secondary carcinoid tumors of other sites: Secondary | ICD-10-CM | POA: Diagnosis not present

## 2017-03-10 DIAGNOSIS — R1909 Other intra-abdominal and pelvic swelling, mass and lump: Secondary | ICD-10-CM | POA: Diagnosis not present

## 2017-03-10 DIAGNOSIS — R197 Diarrhea, unspecified: Secondary | ICD-10-CM

## 2017-03-10 DIAGNOSIS — C7B02 Secondary carcinoid tumors of liver: Secondary | ICD-10-CM

## 2017-03-10 DIAGNOSIS — C7A029 Malignant carcinoid tumor of the large intestine, unspecified portion: Secondary | ICD-10-CM | POA: Diagnosis not present

## 2017-03-10 NOTE — Telephone Encounter (Signed)
Scheduled appt per 6/8 los - Gave patient AVS and calender per los . [facility] to contact patient with ct

## 2017-03-12 ENCOUNTER — Encounter: Payer: Self-pay | Admitting: Hematology

## 2017-03-30 ENCOUNTER — Telehealth: Payer: Self-pay | Admitting: Hematology

## 2017-03-30 NOTE — Telephone Encounter (Signed)
Rescheduled patient injection appointment per patient request

## 2017-03-31 ENCOUNTER — Ambulatory Visit: Payer: Medicare Other

## 2017-04-03 ENCOUNTER — Ambulatory Visit (HOSPITAL_BASED_OUTPATIENT_CLINIC_OR_DEPARTMENT_OTHER): Payer: Medicare Other

## 2017-04-03 VITALS — BP 151/83 | HR 54 | Temp 98.1°F | Resp 18

## 2017-04-03 DIAGNOSIS — C7B02 Secondary carcinoid tumors of liver: Secondary | ICD-10-CM | POA: Diagnosis not present

## 2017-04-03 DIAGNOSIS — C7A029 Malignant carcinoid tumor of the large intestine, unspecified portion: Secondary | ICD-10-CM | POA: Diagnosis not present

## 2017-04-03 DIAGNOSIS — D500 Iron deficiency anemia secondary to blood loss (chronic): Secondary | ICD-10-CM

## 2017-04-03 MED ORDER — OCTREOTIDE ACETATE 30 MG IM KIT
30.0000 mg | PACK | Freq: Once | INTRAMUSCULAR | Status: AC
  Administered 2017-04-03: 30 mg via INTRAMUSCULAR
  Filled 2017-04-03: qty 1

## 2017-04-03 NOTE — Patient Instructions (Signed)

## 2017-04-28 ENCOUNTER — Ambulatory Visit (HOSPITAL_BASED_OUTPATIENT_CLINIC_OR_DEPARTMENT_OTHER): Payer: Medicare Other

## 2017-04-28 VITALS — BP 160/75 | HR 65 | Temp 97.8°F | Resp 20

## 2017-04-28 DIAGNOSIS — D500 Iron deficiency anemia secondary to blood loss (chronic): Secondary | ICD-10-CM

## 2017-04-28 DIAGNOSIS — C7B02 Secondary carcinoid tumors of liver: Secondary | ICD-10-CM

## 2017-04-28 DIAGNOSIS — C7A029 Malignant carcinoid tumor of the large intestine, unspecified portion: Secondary | ICD-10-CM

## 2017-04-28 MED ORDER — OCTREOTIDE ACETATE 30 MG IM KIT
30.0000 mg | PACK | Freq: Once | INTRAMUSCULAR | Status: AC
  Administered 2017-04-28: 30 mg via INTRAMUSCULAR
  Filled 2017-04-28: qty 1

## 2017-04-28 NOTE — Patient Instructions (Signed)

## 2017-05-26 ENCOUNTER — Other Ambulatory Visit (HOSPITAL_BASED_OUTPATIENT_CLINIC_OR_DEPARTMENT_OTHER): Payer: Medicare Other

## 2017-05-26 ENCOUNTER — Ambulatory Visit (HOSPITAL_BASED_OUTPATIENT_CLINIC_OR_DEPARTMENT_OTHER): Payer: Medicare Other

## 2017-05-26 VITALS — BP 149/91 | HR 61 | Temp 97.1°F | Resp 18

## 2017-05-26 DIAGNOSIS — C7A029 Malignant carcinoid tumor of the large intestine, unspecified portion: Secondary | ICD-10-CM

## 2017-05-26 DIAGNOSIS — C7B02 Secondary carcinoid tumors of liver: Secondary | ICD-10-CM | POA: Diagnosis not present

## 2017-05-26 DIAGNOSIS — C7B00 Secondary carcinoid tumors, unspecified site: Secondary | ICD-10-CM

## 2017-05-26 DIAGNOSIS — D500 Iron deficiency anemia secondary to blood loss (chronic): Secondary | ICD-10-CM

## 2017-05-26 LAB — COMPREHENSIVE METABOLIC PANEL
ALT: 12 U/L (ref 0–55)
ANION GAP: 7 meq/L (ref 3–11)
AST: 17 U/L (ref 5–34)
Albumin: 3.4 g/dL — ABNORMAL LOW (ref 3.5–5.0)
Alkaline Phosphatase: 94 U/L (ref 40–150)
BUN: 13.8 mg/dL (ref 7.0–26.0)
CHLORIDE: 107 meq/L (ref 98–109)
CO2: 27 meq/L (ref 22–29)
Calcium: 9.2 mg/dL (ref 8.4–10.4)
Creatinine: 0.9 mg/dL (ref 0.7–1.3)
EGFR: 87 mL/min/{1.73_m2} — AB (ref >90)
GLUCOSE: 85 mg/dL (ref 70–140)
POTASSIUM: 3.9 meq/L (ref 3.5–5.1)
SODIUM: 142 meq/L (ref 136–145)
Total Bilirubin: 0.44 mg/dL (ref 0.20–1.20)
Total Protein: 6.2 g/dL — ABNORMAL LOW (ref 6.4–8.3)

## 2017-05-26 LAB — CBC & DIFF AND RETIC
BASO%: 1.1 % (ref 0.0–2.0)
BASOS ABS: 0.1 10*3/uL (ref 0.0–0.1)
EOS%: 1.3 % (ref 0.0–7.0)
Eosinophils Absolute: 0.1 10*3/uL (ref 0.0–0.5)
HEMATOCRIT: 40 % (ref 38.4–49.9)
HGB: 13.4 g/dL (ref 13.0–17.1)
Immature Retic Fract: 3.3 % (ref 3.00–10.60)
LYMPH#: 1 10*3/uL (ref 0.9–3.3)
LYMPH%: 19.1 % (ref 14.0–49.0)
MCH: 31.5 pg (ref 27.2–33.4)
MCHC: 33.5 g/dL (ref 32.0–36.0)
MCV: 93.9 fL (ref 79.3–98.0)
MONO#: 0.7 10*3/uL (ref 0.1–0.9)
MONO%: 12.6 % (ref 0.0–14.0)
NEUT#: 3.5 10*3/uL (ref 1.5–6.5)
NEUT%: 65.9 % (ref 39.0–75.0)
PLATELETS: 200 10*3/uL (ref 140–400)
RBC: 4.26 10*6/uL (ref 4.20–5.82)
RDW: 13 % (ref 11.0–14.6)
RETIC CT ABS: 27.69 10*3/uL — AB (ref 34.80–93.90)
Retic %: 0.65 % — ABNORMAL LOW (ref 0.80–1.80)
WBC: 5.3 10*3/uL (ref 4.0–10.3)

## 2017-05-26 LAB — FERRITIN: FERRITIN: 24 ng/mL (ref 22–316)

## 2017-05-26 LAB — IRON AND TIBC
%SAT: 24 % (ref 20–55)
IRON: 74 ug/dL (ref 42–163)
TIBC: 311 ug/dL (ref 202–409)
UIBC: 238 ug/dL (ref 117–376)

## 2017-05-26 MED ORDER — OCTREOTIDE ACETATE 30 MG IM KIT
30.0000 mg | PACK | Freq: Once | INTRAMUSCULAR | Status: AC
  Administered 2017-05-26: 30 mg via INTRAMUSCULAR
  Filled 2017-05-26: qty 1

## 2017-05-26 NOTE — Patient Instructions (Signed)

## 2017-05-29 LAB — CHROMOGRANIN A: Chromogranin A: 16 nmol/L — ABNORMAL HIGH (ref 0–5)

## 2017-05-30 LAB — 5 HIAA, QUANTITATIVE, URINE, 24 HOUR
5-HIAA, Urine: 6.1 mg/L
5-HIAA,Quant.,24 Hr Urine: 17.7 mg/24 hr — ABNORMAL HIGH (ref 0.0–14.9)

## 2017-05-31 ENCOUNTER — Ambulatory Visit (HOSPITAL_COMMUNITY)
Admission: RE | Admit: 2017-05-31 | Discharge: 2017-05-31 | Disposition: A | Discharge status: Home / Self Care | Payer: Medicare Other | Source: Ambulatory Visit | Attending: Hematology | Admitting: Hematology

## 2017-05-31 DIAGNOSIS — R918 Other nonspecific abnormal finding of lung field: Secondary | ICD-10-CM | POA: Insufficient documentation

## 2017-05-31 DIAGNOSIS — C7A029 Malignant carcinoid tumor of the large intestine, unspecified portion: Secondary | ICD-10-CM | POA: Insufficient documentation

## 2017-05-31 DIAGNOSIS — I77810 Thoracic aortic ectasia: Secondary | ICD-10-CM | POA: Diagnosis not present

## 2017-05-31 DIAGNOSIS — R188 Other ascites: Secondary | ICD-10-CM | POA: Insufficient documentation

## 2017-05-31 DIAGNOSIS — I700 Atherosclerosis of aorta: Secondary | ICD-10-CM | POA: Diagnosis not present

## 2017-05-31 DIAGNOSIS — C787 Secondary malignant neoplasm of liver and intrahepatic bile duct: Secondary | ICD-10-CM | POA: Diagnosis not present

## 2017-05-31 MED ORDER — IOPAMIDOL (ISOVUE-300) INJECTION 61%
100.0000 mL | Freq: Once | INTRAVENOUS | Status: AC | PRN
  Administered 2017-05-31: 100 mL via INTRAVENOUS

## 2017-05-31 MED ORDER — IOPAMIDOL (ISOVUE-300) INJECTION 61%
INTRAVENOUS | Status: AC
  Filled 2017-05-31: qty 100

## 2017-06-02 ENCOUNTER — Other Ambulatory Visit: Payer: Medicare Other

## 2017-06-07 NOTE — Progress Notes (Signed)
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 Extensive confluent ileal colic adenopathy extending chronically along the superior mesenteric vein, multiple hepatic metastasis      02/18/2015 Initial Diagnosis    Carcinoid tumor of colon, malignant, with liver metastasis      02/18/2015 Initial Biopsy    Liver biopsy showed metastatic low-grade neuroendocrine tumor (carcinoid). The tumor is positive for CD X2, CD 56, chromogranin, and synaptophysin.      03/05/2015 -  Chemotherapy    Sandostatin 30 mg IM every 4 weeks      03/06/2015 Imaging    CT chest showed no evidence of metastasis in the chest. 4.4 cm ascending aortic aneurysm.      09/09/2015 Imaging    Restaging CT abdomen and pelvis with contrast showed interval response of hepatic metastasis. Most are small and, some are stable. Slight interval decrease in mesenteric adenopathy and primary colon tumor.       12/06/2016 Imaging    CT CAP w contrast IMPRESSION: 1. Overall, the extent of metastatic disease to the liver and abdominal/retroperitoneal lymphatics is similar to the prior examination 08/19/2016. 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 2. Multiple small pulmonary nodules appear unchanged in size, number and distribution, favored to be benign. 3. Aortic  atherosclerosis, in addition to 2 vessel coronary artery disease. Assessment for potential risk factor modification, dietary therapy or pharmacologic therapy may be warranted, if clinically indicated. 4. In addition, there is dilatation of the aortic root (4.7 cm in diameter at the level of the sinuses of Valsalva) and ectasia of the ascending thoracic aorta measuring 4.4 cm in diameter. Recommend annual imaging followup by CTA or MRA. This recommendation follows 2010 ACCF/AHA/AATS/ACR/ASA/SCA/SCAI/SIR/STS/SVM Guidelines for the Diagnosis and Management of Patients with Thoracic Aortic Disease. Circulation. 2010; 121: U272-Z366. 5. Trace volume of ascites. 6. Additional incidental findings, as above.       05/31/2017 Imaging    CT CAP W Contrast 05/31/17 IMPRESSION: 1. Overall the extent of metastatic disease to the liver and abdominal adenopathy is similar to previous exam from 12/06/2016. 2. 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 3. Stable small pulmonary nodules. 4.  Aortic Atherosclerosis (ICD10-I70.0). 5. Dilatation of the ascending thoracic aorta. Unchanged. Recommend annual imaging followup by CTA or MRA. This recommendation follows 2010 ACCF/AHA/AATS/ACR/ASA/SCA/SCAI/SIR/STS/SVM Guidelines for the Diagnosis and Management of Patients with Thoracic Aortic Disease. Circulation. 2010; 121: e266-e369 6. Trace ascites.       HISTORY OF PRESENTING ILLNESS:  Ryan Cowan 71 y.o. male is here because of recently diagnosed metastatic carcinoid tumor. I saw him in the hospital initially, this is his first follow-up after his hospital discharge. He presents to the clinic with his 3 sisters.  He has been having diarrhea, fatigue  and dyspnea on exertion for the past 2-3 months. His diarrhea is watery stool, no hematochezia or melena. He denies significant abdominal pain, nausea, or change of his appetite. He lost  about 20 pounds in the past 3 months. No fever, chills or night sweats. He has mild chronic dry cough which has not changed lately. He presented to his primary care physician 2-3 weeks ago, had lab work done, and was found to have hemoglobin 6.7. He was sent to emergency room on 02/16/2015 and was admitted. CT of abdomen and pelvis showed a cecal mass, adenopathy, and multiple liver lesions, which is highly suspicious for metastatic colon cancer. He received 2 units of blood transfusion, IV dextran 250 mg, and underwent liver biopsy. He was discharged home after the biopsy.  He is single, no children. He was independent, but not very physically active. He lives in a trailer. He has 3 sisters and parents live in [place] area. No family history of colon cancer or other malignancy. He moved to his mother's house, and is now very close to his sisters. His mom lives in a nursing home.  He feels better lately, gained some weight. His diarrhea is unchanged. He has mild intermittent low abdominal pain after bowel movement. No other new complaints.  CURRENT THERAPY: Sandostatin 30 mg injection every 4 weeks  Interim history:  Ryan Cowan returns for follow up. He presents to clinic with his sisters.  He reports to doing well. He is able to drink tea with no sugar, lipton decaffeinated. He drinks possibly over a gallon of tea. His appetite is good, he is eating less meals but still eat snacks. His diet consists of vegetables, meat and some starch. He does not eat many fresh veggies.  This morning he had BM 3 times with soft stool. He averages 2-3 BM a day, with watery stool.    MEDICAL HISTORY:  Past Medical History:  Diagnosis Date   Liver metastases ([place])     SURGICAL HISTORY: Past Surgical History:  Procedure Laterality Date   APPENDECTOMY     KNEE SURGERY     SHOULDER SURGERY      SOCIAL HISTORY: Social History   Social History   Marital status: Single    Spouse name: N/A   Number of  children: N/A   Years of education: N/A   Occupational History   Not on file.   Social History Main Topics   Smoking status: Former Smoker    Packs/day: 2.50    Years: 50.00    Quit date: 10/04/2011   Smokeless tobacco: Never Used   Alcohol use No   Drug use: Unknown   Sexual activity: Not on file   Other Topics Concern   Not on file   Social History Narrative   Lives alone in his mother's home (in SNF)   Has #3 sisters, [name] is POA   Prior employed in [employer] and was in [employer] 4 years   Very detail oriented in conversation    FAMILY HISTORY: Family History  Problem Relation Age of Onset   CAD Mother    CAD Father    Hypertension Father    Hypertension Sister     ALLERGIES:  is allergic to codeine.  MEDICATIONS:  Current Outpatient Prescriptions  Medication Sig Dispense Refill   Multiple Vitamin (MULTIVITAMIN WITH MINERALS) TABS tablet Take 1 tablet by mouth daily. Centrum Silver 50 plus     naproxen sodium (ANAPROX) 220 MG tablet Take 220  mg by mouth 2 (two) times daily as needed (pain).     OVER THE COUNTER MEDICATION Place 1 application into both nostrils daily as needed (congestion). Vicks inhaler stick     No current facility-administered medications for this visit.     REVIEW OF SYSTEMS:   Constitutional: Denies fevers, chills or abnormal night sweats Eyes: Denies blurriness of vision, double vision or watery eyes Ears, nose, mouth, throat, and face: Denies mucositis or sore throat Respiratory: Denies cough, dyspnea or wheezes Cardiovascular: Denies palpitation, chest discomfort or lower extremity swelling Gastrointestinal:  Denies nausea, heartburn or change in bowel habits (+) Loose BM 2-3 times a day Skin: Denies abnormal skin rashes Lymphatics: Denies new lymphadenopathy or easy bruising Neurological:Denies numbness, tingling or new weaknesses Behavioral/Psych: Mood is stable, no new changes  All other systems were  reviewed with the patient and are negative.  PHYSICAL EXAMINATION:  ECOG PERFORMANCE STATUS: 1  Vitals:   06/09/17 1328  BP: (!) 150/79  Pulse: 62  Resp: 18  Temp: 98 F (36.7 C)  SpO2: 96%   Filed Weights   06/09/17 1328  Weight: 143 lb (64.9 kg)    GENERAL:alert, no distress and comfortable SKIN: skin color, texture, turgor are normal, no rashes or significant lesions EYES: normal, conjunctiva are pink and non-injected, sclera clear OROPHARYNX:no exudate, no erythema and lips, buccal mucosa, and tongue normal  NECK: supple, thyroid normal size, non-tender, without nodularity LYMPH:  no palpable lymphadenopathy in the cervical, axillary or inguinal LUNGS: clear to auscultation and percussion with normal breathing effort HEART: regular rate & rhythm and no murmurs and no lower extremity edema  ABDOMEN:abdomen soft, non-tender and normal bowel sounds, no organomegaly  Musculoskeletal:no cyanosis of digits and no clubbing  PSYCH: alert & oriented x 3 with fluent speech NEURO: no focal motor/sensory deficits  LABORATORY DATA:  I have reviewed the data as listed CBC Latest Ref Rng & Units 05/26/2017 03/03/2017 12/02/2016  WBC 4.0 - 10.3 10e3/uL 5.3 5.1 5.2  Hemoglobin 13.0 - 17.1 g/dL 13.4 13.0 13.6  Hematocrit 38.4 - 49.9 % 40.0 38.5 40.4  Platelets 140 - 400 10e3/uL 200 179 200   CMP Latest Ref Rng & Units 05/26/2017 03/03/2017 12/02/2016  Glucose 70 - 140 mg/dl 85 82 92  BUN 7.0 - 26.0 mg/dL 13.8 18.3 19.7  Creatinine 0.7 - 1.3 mg/dL 0.9 1.0 0.8  Sodium 136 - 145 mEq/L 142 140 141  Potassium 3.5 - 5.1 mEq/L 3.9 3.9 3.9  Chloride 101 - 111 mmol/L - - -  CO2 22 - 29 mEq/L 27 28 27   Calcium 8.4 - 10.4 mg/dL 9.2 9.1 9.1  Total Protein 6.4 - 8.3 g/dL 6.2(L) 6.1(L) 6.2(L)  Total Bilirubin 0.20 - 1.20 mg/dL 0.44 0.43 0.36  Alkaline Phos 40 - 150 U/L 94 79 85  AST 5 - 34 U/L 17 12 14   ALT 0 - 55 U/L 12 8 11     Chromogranin A (<=15ng/ml) 04/03/2015: 93 05/29/2015: 43 08/21/2015:  43 12/07/2015: 8 03/04/2016: 11 08/19/2016: 8 12/02/2016: 13 03/03/2017: 15 05/26/17: 16  24 hour urine 5-HIAA (<6.0 mg/24h) 04/03/2015: 51.3 09/17/2016: 12.9 12/11/2015: 23 06/24/2016: 23.2 12/05/2016: 18.4 03/03/2017: 28.4 05/26/17: 17.7  PATHOLOGY REPORT: Diagnosis 02/18/2015 Liver, needle/core biopsy, left lobe - METASTATIC LOW GRADE NEUROENDOCRINE TUMOR (CARCINOID). - SEE COMMENT.   RADIOGRAPHIC STUDIES: I have personally reviewed the radiological images as listed and agreed with the findings in the report.  CT CAP W Contrast 05/31/17 IMPRESSION: 1. Overall the extent of metastatic  disease to the liver and abdominal adenopathy is similar to previous exam from 12/06/2016. 2. 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 3. Stable small pulmonary nodules. 4.  Aortic Atherosclerosis (ICD10-I70.0). 5. Dilatation of the ascending thoracic aorta. Unchanged. Recommend annual imaging followup by CTA or MRA. This recommendation follows 2010 ACCF/AHA/AATS/ACR/ASA/SCA/SCAI/SIR/STS/SVM Guidelines for the Diagnosis and Management of Patients with Thoracic Aortic Disease. Circulation. 2010; 121: e266-e369 6. Trace ascites.  Ct chest, abdomen Pelvis W Contrast 08/19/2016 IMPRESSION: 1. Overall, interval progression of multifocal liver metastases. 2. No wall mass within the central mesenteric, which obstructs the superior mesenteric vein, is not significantly changed when compared with previous exam. Associated mesenteric edema and small bowel wall thickening noted in the right lower quadrant. 3. Small nonspecific nodules are unchanged when compared with previous exam. 4. Ascending thoracic aortic aneurysm measuring 4 cm. Recommend annual imaging followup by CTA or MRA. This recommendation follows 2010 ACCF/AHA/AATS/ACR/ASA/SCA/SCAI/SIR/STS/SVM Guidelines for the Diagnosis and Management of Patients with Thoracic Aortic  Disease. Circulation. 2010; 121: U132-G401  Ct chest, abdomen Pelvis W Contrast 12/06/16 IMPRESSION: 1. Overall, the extent of metastatic disease to the liver and abdominal/retroperitoneal lymphatics is similar to the prior examination 08/19/2016. 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 2. Multiple small pulmonary nodules appear unchanged in size, number and distribution, favored to be benign. 3. Aortic atherosclerosis, in addition to 2 vessel coronary artery disease. Assessment for potential risk factor modification, dietary therapy or pharmacologic therapy may be warranted, if clinically indicated. 4. In addition, there is dilatation of the aortic root (4.7 cm in diameter at the level of the sinuses of Valsalva) and ectasia of the ascending thoracic aorta measuring 4.4 cm in diameter. Recommend annual imaging followup by CTA or MRA. This recommendation follows 2010 ACCF/AHA/AATS/ACR/ASA/SCA/SCAI/SIR/STS/SVM Guidelines for the Diagnosis and Management of Patients with Thoracic Aortic Disease. Circulation. 2010; 121: U272-Z366. 5. Trace volume of ascites. 6. Additional incidental findings, as above.   ASSESSMENT & PLAN: 71 y.o.  Caucasian male without significant past medical history  1. 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 The liver biopsy surgical past findings were reviewed with them in great details. -The natural history of carcinoid tumor was reviewed with them. It is an indolent tumor, unfortunately has metastasized to liver, it is incurable at this stage. -The goal of therapy is tumor control, and prolong his life. -He has very bulky abdominal adenopathy, potentially can cause mall bowel obstruction and SMV occlusion. This was discussed with the patient and his family -I  previously discussed his restaging scan from 12/06/2016, which showed stable disease. No other new metastasis. --he is clinically doing very Well, asymptomat. His initial serum chromogranin was previously elevated at 93, has come down significantly, corresponding to good response. The level has slightly increased lately, will continue monitoring.  -We also previously discussed about the new treatment option Lutathera therapy for metastatic neuroendocrine tumors, which has been approved by FDA in Jan 2018. He would be a good candidate if he progresses on Sandostatin injection -We discussed his 06/01/17 CT CAP that showed stable disease.  -Previous 05/26/17 labs were reviewed that shows slight increase in Chromogranin A that we will watch. His CBC and CMP is overall within normal limits. No  Clinical concern for progression, we'll continue Sandostatin  injection.    2. 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  -I previously repeated iron studies normal in June 2017. He is not on oral iron supplement. - I previously encouraged him to exercise more to help with energy level  -His iron study (05/26/17) is normal, anemia currently  resolved   3. Diarrhea -secondary to #1, overall much improved  -His diarrhea is mild and intermittent, I encouraged him to use Imodium as needed.  -His diarrhea has improved and almost controlled, he goes 2-3 a day.   4. Possible right inguinal hernia - Patient complains of having a "knot" in th groin area. - I have previously dicussed with the patient and we have agreed to just continue watching the area as long as no pain is present.   Plan  -F/u in 3 months with lab one week before -schedule Sandostatin Injection every 4 weeks X4, stating in 6 weeks     All questions were answered. The patient knows to call the  clinic with any problems, questions or concerns.  I spent 20 minutes counseling the patient face to face. The total time spent in the appointment was 25 minutes and more than 50% was on counseling.   This document serves as a record of services personally performed by [name], MD. It was created on her behalf by Joslyn Devon, a trained medical scribe. The creation of this record is based on the scribe's personal observations and the provider's statements to them. This document has been checked and approved by the attending provider.    [name], MD 06/09/2017

## 2017-06-09 ENCOUNTER — Encounter: Payer: Self-pay | Admitting: Hematology

## 2017-06-09 ENCOUNTER — Telehealth: Payer: Self-pay | Admitting: Hematology

## 2017-06-09 ENCOUNTER — Ambulatory Visit (HOSPITAL_BASED_OUTPATIENT_CLINIC_OR_DEPARTMENT_OTHER): Payer: Medicare Other | Admitting: Hematology

## 2017-06-09 VITALS — BP 150/79 | HR 62 | Temp 98.0°F | Resp 18 | Ht 74.0 in | Wt 143.0 lb

## 2017-06-09 DIAGNOSIS — D500 Iron deficiency anemia secondary to blood loss (chronic): Secondary | ICD-10-CM

## 2017-06-09 DIAGNOSIS — R197 Diarrhea, unspecified: Secondary | ICD-10-CM

## 2017-06-09 DIAGNOSIS — C7B09 Secondary carcinoid tumors of other sites: Secondary | ICD-10-CM

## 2017-06-09 DIAGNOSIS — C7A029 Malignant carcinoid tumor of the large intestine, unspecified portion: Secondary | ICD-10-CM | POA: Diagnosis not present

## 2017-06-09 DIAGNOSIS — C787 Secondary malignant neoplasm of liver and intrahepatic bile duct: Secondary | ICD-10-CM

## 2017-06-09 DIAGNOSIS — C7B02 Secondary carcinoid tumors of liver: Secondary | ICD-10-CM

## 2017-06-09 NOTE — Telephone Encounter (Signed)
Gave patient AVS and calendar of upcoming September appointments.  °

## 2017-06-10 ENCOUNTER — Encounter: Payer: Self-pay | Admitting: Hematology

## 2017-06-23 ENCOUNTER — Ambulatory Visit (HOSPITAL_BASED_OUTPATIENT_CLINIC_OR_DEPARTMENT_OTHER): Payer: Medicare Other

## 2017-06-23 VITALS — BP 148/82 | HR 53 | Temp 97.4°F | Resp 20

## 2017-06-23 DIAGNOSIS — D500 Iron deficiency anemia secondary to blood loss (chronic): Secondary | ICD-10-CM

## 2017-06-23 DIAGNOSIS — C7A029 Malignant carcinoid tumor of the large intestine, unspecified portion: Secondary | ICD-10-CM | POA: Diagnosis not present

## 2017-06-23 DIAGNOSIS — C7B02 Secondary carcinoid tumors of liver: Secondary | ICD-10-CM

## 2017-06-23 MED ORDER — OCTREOTIDE ACETATE 30 MG IM KIT
30.0000 mg | PACK | Freq: Once | INTRAMUSCULAR | Status: AC
  Administered 2017-06-23: 30 mg via INTRAMUSCULAR
  Filled 2017-06-23: qty 1

## 2017-06-23 NOTE — Patient Instructions (Signed)

## 2017-07-21 ENCOUNTER — Ambulatory Visit (HOSPITAL_BASED_OUTPATIENT_CLINIC_OR_DEPARTMENT_OTHER): Payer: Medicare Other

## 2017-07-21 VITALS — BP 130/68 | HR 75 | Temp 97.5°F | Resp 20

## 2017-07-21 DIAGNOSIS — C7B02 Secondary carcinoid tumors of liver: Secondary | ICD-10-CM

## 2017-07-21 DIAGNOSIS — D500 Iron deficiency anemia secondary to blood loss (chronic): Secondary | ICD-10-CM

## 2017-07-21 DIAGNOSIS — C7A029 Malignant carcinoid tumor of the large intestine, unspecified portion: Secondary | ICD-10-CM | POA: Diagnosis not present

## 2017-07-21 MED ORDER — OCTREOTIDE ACETATE 30 MG IM KIT
30.0000 mg | PACK | Freq: Once | INTRAMUSCULAR | Status: AC
  Administered 2017-07-21: 30 mg via INTRAMUSCULAR
  Filled 2017-07-21: qty 1

## 2017-07-21 NOTE — Patient Instructions (Signed)

## 2017-08-18 ENCOUNTER — Ambulatory Visit (HOSPITAL_BASED_OUTPATIENT_CLINIC_OR_DEPARTMENT_OTHER): Payer: Medicare Other

## 2017-08-18 VITALS — BP 144/85 | HR 66 | Temp 97.3°F | Resp 18

## 2017-08-18 DIAGNOSIS — C7A029 Malignant carcinoid tumor of the large intestine, unspecified portion: Secondary | ICD-10-CM

## 2017-08-18 DIAGNOSIS — C7B02 Secondary carcinoid tumors of liver: Secondary | ICD-10-CM

## 2017-08-18 DIAGNOSIS — D500 Iron deficiency anemia secondary to blood loss (chronic): Secondary | ICD-10-CM

## 2017-08-18 MED ORDER — OCTREOTIDE ACETATE 30 MG IM KIT
30.0000 mg | PACK | Freq: Once | INTRAMUSCULAR | Status: AC
  Administered 2017-08-18: 30 mg via INTRAMUSCULAR
  Filled 2017-08-18: qty 1

## 2017-08-18 NOTE — Patient Instructions (Signed)

## 2017-09-08 ENCOUNTER — Other Ambulatory Visit (HOSPITAL_BASED_OUTPATIENT_CLINIC_OR_DEPARTMENT_OTHER): Payer: Medicare Other

## 2017-09-08 ENCOUNTER — Other Ambulatory Visit: Payer: Self-pay | Admitting: Hematology

## 2017-09-08 DIAGNOSIS — C7A029 Malignant carcinoid tumor of the large intestine, unspecified portion: Secondary | ICD-10-CM

## 2017-09-08 LAB — COMPREHENSIVE METABOLIC PANEL
ALT: 11 U/L (ref 0–55)
ANION GAP: 11 meq/L (ref 3–11)
AST: 17 U/L (ref 5–34)
Albumin: 3.4 g/dL — ABNORMAL LOW (ref 3.5–5.0)
Alkaline Phosphatase: 84 U/L (ref 40–150)
BILIRUBIN TOTAL: 0.42 mg/dL (ref 0.20–1.20)
BUN: 19.9 mg/dL (ref 7.0–26.0)
CHLORIDE: 106 meq/L (ref 98–109)
CO2: 25 meq/L (ref 22–29)
Calcium: 8.8 mg/dL (ref 8.4–10.4)
Creatinine: 0.8 mg/dL (ref 0.7–1.3)
Glucose: 94 mg/dl (ref 70–140)
POTASSIUM: 3.8 meq/L (ref 3.5–5.1)
Sodium: 141 mEq/L (ref 136–145)
Total Protein: 6.3 g/dL — ABNORMAL LOW (ref 6.4–8.3)

## 2017-09-08 LAB — CBC WITH DIFFERENTIAL/PLATELET
BASO%: 1.7 % (ref 0.0–2.0)
BASOS ABS: 0.1 10*3/uL (ref 0.0–0.1)
EOS ABS: 0.1 10*3/uL (ref 0.0–0.5)
EOS%: 2.3 % (ref 0.0–7.0)
HEMATOCRIT: 39 % (ref 38.4–49.9)
HEMOGLOBIN: 13 g/dL (ref 13.0–17.1)
LYMPH#: 1 10*3/uL (ref 0.9–3.3)
LYMPH%: 18.8 % (ref 14.0–49.0)
MCH: 30.9 pg (ref 27.2–33.4)
MCHC: 33.3 g/dL (ref 32.0–36.0)
MCV: 92.6 fL (ref 79.3–98.0)
MONO#: 0.7 10*3/uL (ref 0.1–0.9)
MONO%: 12.4 % (ref 0.0–14.0)
NEUT#: 3.4 10*3/uL (ref 1.5–6.5)
NEUT%: 64.8 % (ref 39.0–75.0)
Platelets: 221 10*3/uL (ref 140–400)
RBC: 4.21 10*6/uL (ref 4.20–5.82)
RDW: 13.2 % (ref 11.0–14.6)
WBC: 5.3 10*3/uL (ref 4.0–10.3)

## 2017-09-11 LAB — CHROMOGRANIN A: CHROMOGRAN A: 15 nmol/L — AB (ref 0–5)

## 2017-09-14 NOTE — Progress Notes (Signed)
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 Extensive confluent ileal colic adenopathy extending chronically along the superior mesenteric vein, multiple hepatic metastasis      02/18/2015 Initial Diagnosis    Carcinoid tumor of colon, malignant, with liver metastasis      02/18/2015 Initial Biopsy    Liver biopsy showed metastatic low-grade neuroendocrine tumor (carcinoid). The tumor is positive for CD X2, CD 56, chromogranin, and synaptophysin.      03/05/2015 -  Chemotherapy    Sandostatin 30 mg IM every 4 weeks      03/06/2015 Imaging    CT chest showed no evidence of metastasis in the chest. 4.4 cm ascending aortic aneurysm.      09/09/2015 Imaging    Restaging CT abdomen and pelvis with contrast showed interval response of hepatic metastasis. Most are small and, some are stable. Slight interval decrease in mesenteric adenopathy and primary colon tumor.       12/06/2016 Imaging    CT CAP w contrast IMPRESSION: 1. Overall, the extent of metastatic disease to the liver and abdominal/retroperitoneal lymphatics is similar to the prior examination 08/19/2016. 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 2. Multiple small pulmonary nodules appear unchanged in size, number and distribution, favored to  be benign. 3. Aortic atherosclerosis, in addition to 2 vessel coronary artery disease. Assessment for potential risk factor modification, dietary therapy or pharmacologic therapy may be warranted, if clinically indicated. 4. In addition, there is dilatation of the aortic root (4.7 cm in diameter at the level of the sinuses of Valsalva) and ectasia of the ascending thoracic aorta measuring 4.4 cm in diameter. Recommend annual imaging followup by CTA or MRA. This recommendation follows 2010 ACCF/AHA/AATS/ACR/ASA/SCA/SCAI/SIR/STS/SVM Guidelines for the Diagnosis and Management of Patients with Thoracic Aortic Disease. Circulation. 2010; 121: H371-I967. 5. Trace volume of ascites. 6. Additional incidental findings, as above.       05/31/2017 Imaging    CT CAP W Contrast 05/31/17 IMPRESSION: 1. Overall the extent of metastatic disease to the liver and abdominal adenopathy is similar to previous exam from 12/06/2016. 2. 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 3. Stable small pulmonary nodules. 4.  Aortic Atherosclerosis (ICD10-I70.0). 5. Dilatation of the ascending thoracic aorta. Unchanged. Recommend annual imaging followup by CTA or MRA. This recommendation follows 2010 ACCF/AHA/AATS/ACR/ASA/SCA/SCAI/SIR/STS/SVM Guidelines for the Diagnosis and Management of Patients with Thoracic Aortic Disease. Circulation. 2010; 121: e266-e369 6. Trace ascites.       HISTORY OF PRESENTING ILLNESS:  Ryan Cowan 71 y.o. male is here because of recently diagnosed metastatic carcinoid tumor. I saw him in the hospital initially, this is his first follow-up after his hospital discharge. He presents to the clinic with his 3 sisters.  He has been having diarrhea, fatigue and dyspnea on exertion for the past 2-3 months. His diarrhea is watery stool, no hematochezia or melena. He denies significant abdominal pain, nausea, or change of his  appetite. He lost about 20 pounds in the past 3 months. No fever, chills or night sweats. He has mild chronic dry cough which has not changed lately. He presented to his primary care physician 2-3 weeks ago, had lab work done, and was found to have hemoglobin 6.7. He was sent to emergency room on 02/16/2015 and was admitted. CT of abdomen and pelvis showed a cecal mass, adenopathy, and multiple liver lesions, which is highly suspicious for metastatic colon cancer. He received 2 units of blood transfusion, IV dextran 250 mg, and underwent liver biopsy. He was discharged home after the biopsy.  He is single, no children. He was independent, but not very physically active. He lives in a trailer. He has 3 sisters and parents live in [place] area. No family history of colon cancer or other malignancy. He moved to his mother's house, and is now very close to his sisters. His mom lives in a nursing home.  He feels better lately, gained some weight. His diarrhea is unchanged. He has mild intermittent low abdominal pain after bowel movement. No other new complaints.  CURRENT THERAPY: Sandostatin 30 mg injection every 4 weeks  Interim history: Ryan Cowan returns for follow up. He presents to clinic with his family. He notes he has been doing well. He is able to eat enough to have stable weight. He notes having 5-10 second abdominal cramps in the past. This had not recurred for months until this morning.     MEDICAL HISTORY:  Past Medical History:  Diagnosis Date  . Liver metastases ([place])     SURGICAL HISTORY: Past Surgical History:  Procedure Laterality Date  . APPENDECTOMY    . KNEE SURGERY    . SHOULDER SURGERY      SOCIAL HISTORY: Social History   Socioeconomic History  . Marital status: Single    Spouse name: Not on file  . Number of children: Not on file  . Years of education: Not on file  . Highest education level: Not on file  Social Needs  . Financial resource strain: Not on file    . Food insecurity - worry: Not on file  . Food insecurity - inability: Not on file  . Transportation needs - medical: Not on file  . Transportation needs - non-medical: Not on file  Occupational History  . Not on file  Tobacco Use  . Smoking status: Former Smoker    Packs/day: 2.50    Years: 50.00    Pack years: 125.00    Last attempt to quit: 10/04/2011    Years since quitting: 5.9  . Smokeless tobacco: Never Used  Substance and Sexual Activity  . Alcohol use: No  . Drug use: Not on file  . Sexual activity: Not on file  Other Topics Concern  . Not on file  Social History Narrative   Lives alone in his mother's home (in SNF)   Has #3 sisters, [name] is POA   Prior employed in [employer] and was in [employer] 4 years   Very detail oriented in conversation    FAMILY HISTORY: Family History  Problem Relation Age of Onset  . CAD Mother   . CAD Father   . Hypertension Father   . Hypertension Sister     ALLERGIES:  is allergic to codeine.  MEDICATIONS:  Current Outpatient Medications  Medication Sig Dispense Refill  . Multiple Vitamin (MULTIVITAMIN WITH MINERALS) TABS tablet Take 1 tablet by mouth daily. Centrum Silver 50 plus    . naproxen sodium (ANAPROX) 220 MG tablet Take 220 mg by mouth 2 (two) times daily as needed (pain).    Marland Kitchen OVER THE COUNTER MEDICATION Place 1 application into both nostrils daily as needed (congestion). Vicks inhaler stick     No current facility-administered medications for this visit.     REVIEW OF SYSTEMS:   Constitutional: Denies fevers, chills or abnormal night sweats Eyes: Denies blurriness of vision, double vision or watery eyes Ears, nose, mouth, throat, and face: Denies mucositis or sore throat Respiratory: Denies cough, dyspnea or wheezes Cardiovascular: Denies palpitation, chest discomfort or lower extremity swelling Gastrointestinal:  Denies nausea, heartburn or change in bowel habits (+) Loose BM 2-3 times a day (+)  abdominal cramping this morning Skin: Denies abnormal skin rashes Lymphatics: Denies new lymphadenopathy or easy bruising Neurological:Denies numbness, tingling or new weaknesses Behavioral/Psych: Mood is stable, no new changes  All other systems were reviewed with the patient and are negative.  PHYSICAL EXAMINATION:  ECOG PERFORMANCE STATUS: 1  Vitals:   09/15/17 1506  BP: (!) 149/79  Pulse: 61  Resp: 20  Temp: 98 F (36.7 C)  SpO2: 96%   Filed Weights   09/15/17 1506  Weight: 145 lb 14.4 oz (66.2 kg)    GENERAL:alert, no distress and comfortable SKIN: skin color, texture, turgor are normal, no rashes or significant lesions EYES: normal, conjunctiva are pink and non-injected, sclera clear OROPHARYNX:no exudate, no erythema and lips, buccal mucosa, and tongue normal  NECK: supple, thyroid normal size, non-tender, without nodularity LYMPH:  no palpable lymphadenopathy in the cervical, axillary or inguinal LUNGS: clear to auscultation and percussion with normal breathing effort HEART: regular rate & rhythm and no murmurs and no lower extremity edema  ABDOMEN:abdomen soft, non-tender and normal bowel sounds, no organomegaly  Musculoskeletal:no cyanosis of digits and no clubbing  PSYCH: alert & oriented x 3 with fluent speech NEURO: no focal motor/sensory deficits  LABORATORY DATA:  I have reviewed the data as listed CBC Latest Ref Rng & Units 09/08/2017 05/26/2017 03/03/2017  WBC 4.0 - 10.3 10e3/uL 5.3 5.3 5.1  Hemoglobin 13.0 - 17.1 g/dL 13.0 13.4 13.0  Hematocrit 38.4 - 49.9 % 39.0 40.0 38.5  Platelets 140 - 400 10e3/uL 221 200 179   CMP Latest Ref Rng & Units 09/08/2017 05/26/2017 03/03/2017  Glucose 70 - 140 mg/dl 94 85 82  BUN 7.0 - 26.0 mg/dL 19.9 13.8 18.3  Creatinine 0.7 - 1.3 mg/dL 0.8 0.9 1.0  Sodium 136 - 145 mEq/L 141 142 140  Potassium 3.5 - 5.1 mEq/L 3.8 3.9 3.9  Chloride 101 - 111 mmol/L - - -  CO2 22 - 29 mEq/L 25 27 28   Calcium 8.4 - 10.4 mg/dL 8.8 9.2 9.1    Total Protein 6.4 - 8.3 g/dL 6.3(L) 6.2(L) 6.1(L)  Total Bilirubin 0.20 - 1.20 mg/dL 0.42 0.44 0.43  Alkaline Phos 40 - 150 U/L 84 94 79  AST 5 - 34 U/L 17 17 12   ALT 0 - 55 U/L 11 12 8     Chromogranin A (<=15ng/ml) 04/03/2015: 93 05/29/2015: 43 08/21/2015: 43 12/07/2015: 8 03/04/2016: 11 08/19/2016: 8 12/02/2016: 13 03/03/2017: 15 05/26/17: 16 09/08/17: 15  24 hour urine 5-HIAA (<6.0 mg/24h) 04/03/2015: 51.3 09/17/2016: 12.9 12/11/2015: 23 06/24/2016: 23.2 12/05/2016: 18.4 03/03/2017: 28.4  05/26/17: 17.7  PATHOLOGY REPORT: Diagnosis 02/18/2015 Liver, needle/core biopsy, left lobe - METASTATIC LOW GRADE NEUROENDOCRINE TUMOR (CARCINOID). - SEE COMMENT.   RADIOGRAPHIC STUDIES: I have personally reviewed the radiological images as listed and agreed with the findings in the report.  CT CAP W Contrast 05/31/17 IMPRESSION: 1. Overall the extent of metastatic disease to the liver and abdominal adenopathy is similar to previous exam from 12/06/2016. 2. 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 3. Stable small pulmonary nodules. 4.  Aortic Atherosclerosis (ICD10-I70.0). 5. Dilatation of the ascending thoracic aorta. Unchanged. Recommend annual imaging followup by CTA or MRA. This recommendation follows 2010 ACCF/AHA/AATS/ACR/ASA/SCA/SCAI/SIR/STS/SVM Guidelines for the Diagnosis and Management of Patients with Thoracic Aortic Disease. Circulation. 2010; 121: e266-e369 6. Trace ascites.  Ct chest, abdomen Pelvis W Contrast 08/19/2016 IMPRESSION: 1. Overall, interval progression of multifocal liver metastases. 2. No wall mass within the central mesenteric, which obstructs the superior mesenteric vein, is not significantly changed when compared with previous exam. Associated mesenteric edema and small bowel wall thickening noted in the right lower quadrant. 3. Small nonspecific nodules are unchanged when compared with previous  exam. 4. Ascending thoracic aortic aneurysm measuring 4 cm. Recommend annual imaging followup by CTA or MRA. This recommendation follows 2010 ACCF/AHA/AATS/ACR/ASA/SCA/SCAI/SIR/STS/SVM Guidelines for the Diagnosis and Management of Patients with Thoracic Aortic Disease. Circulation. 2010; 121: V409-W119  Ct chest, abdomen Pelvis W Contrast 12/06/16 IMPRESSION: 1. Overall, the extent of metastatic disease to the liver and abdominal/retroperitoneal lymphatics is similar to the prior examination 08/19/2016. 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 2. Multiple small pulmonary nodules appear unchanged in size, number and distribution, favored to be benign. 3. Aortic atherosclerosis, in addition to 2 vessel coronary artery disease. Assessment for potential risk factor modification, dietary therapy or pharmacologic therapy may be warranted, if clinically indicated. 4. In addition, there is dilatation of the aortic root (4.7 cm in diameter at the level of the sinuses of Valsalva) and ectasia of the ascending thoracic aorta measuring 4.4 cm in diameter. Recommend annual imaging followup by CTA or MRA. This recommendation follows 2010 ACCF/AHA/AATS/ACR/ASA/SCA/SCAI/SIR/STS/SVM Guidelines for the Diagnosis and Management of Patients with Thoracic Aortic Disease. Circulation. 2010; 121: J478-G956. 5. Trace volume of ascites. 6. Additional incidental findings, as above.   ASSESSMENT & PLAN: 71 y.o.  Caucasian male without significant past medical history  1. 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 The liver biopsy surgical past findings were reviewed with them in great details. -The natural history of carcinoid tumor was reviewed with them. It is an indolent tumor, unfortunately has metastasized to  liver, it is incurable at this stage. -The goal of therapy is tumor control, and prolong his life. -He has very bulky abdominal adenopathy, potentially can cause mall bowel obstruction and SMV occlusion. This was discussed with the patient and his family -I previously discussed his restaging scan from 12/06/2016, which showed stable disease. No other new metastasis. --he is clinically doing very Well, asymptomatic. His initial serum chromogranin was previously elevated at 93, has come down significantly, corresponding to good response. The level has slightly increased lately, will continue monitoring.  -We also previously discussed about the new treatment option Lutathera therapy for metastatic neuroendocrine tumors, which has been approved by FDA in Jan 2018. He  would be a good candidate if he progresses on Sandostatin injection -We discussed his 06/01/17 CT CAP that showed stable disease.  -09/08/17 labs reviewed and WNL Chromogranin A has been low and stable lately. No clinical concern for progression, we'll continue Sandostatin injection today.  -I offered flu shot, but he declined today.  -F/u in 3 months with CT CAP   2. 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  -I previously repeated iron studies normal in June 2017. He is not on oral iron supplement. -I previously encouraged him to exercise more to help with energy level  -His iron study on (05/26/17) was normal, anemia currently resolved   3. Diarrhea -secondary to #1, overall much improved  -His diarrhea is mild and intermittent, I encouraged him to use Imodium as needed.  -His diarrhea has improved and almost controlled, he goes 2-3 a day.   4. Possible right inguinal hernia - Patient complains of having a "knot" in th groin area. - I have previously dicussed with the patient  and we have agreed to just continue watching the area as long as no pain is present.   Plan  -F/u in 3 months -Lab and CT CAP with contrast a few days before  -Continue with Sandostatin injection today and every 4 weeks    All questions were answered. The patient knows to call the clinic with any problems, questions or concerns.  I spent 20 minutes counseling the patient face to face. The total time spent in the appointment was 25 minutes and more than 50% was on counseling.  This document serves as a record of services personally performed by [name], MD. It was created on her behalf by Joslyn Devon, a trained medical scribe. The creation of this record is based on the scribe's personal observations and the provider's statements to them.    I have reviewed the above documentation for accuracy and completeness, and I agree with the above.   [name], MD 09/15/2017

## 2017-09-15 ENCOUNTER — Telehealth: Payer: Self-pay | Admitting: Hematology

## 2017-09-15 ENCOUNTER — Ambulatory Visit (HOSPITAL_BASED_OUTPATIENT_CLINIC_OR_DEPARTMENT_OTHER): Payer: Medicare Other | Admitting: Hematology

## 2017-09-15 ENCOUNTER — Encounter: Payer: Self-pay | Admitting: Hematology

## 2017-09-15 ENCOUNTER — Ambulatory Visit: Payer: Medicare Other

## 2017-09-15 ENCOUNTER — Ambulatory Visit (HOSPITAL_BASED_OUTPATIENT_CLINIC_OR_DEPARTMENT_OTHER): Payer: Medicare Other

## 2017-09-15 VITALS — BP 155/88 | HR 76 | Temp 97.7°F | Resp 20

## 2017-09-15 VITALS — BP 149/79 | HR 61 | Temp 98.0°F | Resp 20 | Ht 74.0 in | Wt 145.9 lb

## 2017-09-15 DIAGNOSIS — C7A029 Malignant carcinoid tumor of the large intestine, unspecified portion: Secondary | ICD-10-CM | POA: Diagnosis not present

## 2017-09-15 DIAGNOSIS — C7B09 Secondary carcinoid tumors of other sites: Secondary | ICD-10-CM | POA: Diagnosis not present

## 2017-09-15 DIAGNOSIS — C7B02 Secondary carcinoid tumors of liver: Secondary | ICD-10-CM

## 2017-09-15 DIAGNOSIS — R197 Diarrhea, unspecified: Secondary | ICD-10-CM

## 2017-09-15 DIAGNOSIS — D500 Iron deficiency anemia secondary to blood loss (chronic): Secondary | ICD-10-CM

## 2017-09-15 DIAGNOSIS — R109 Unspecified abdominal pain: Secondary | ICD-10-CM

## 2017-09-15 MED ORDER — OCTREOTIDE ACETATE 30 MG IM KIT
PACK | INTRAMUSCULAR | Status: AC
  Filled 2017-09-15: qty 1

## 2017-09-15 MED ORDER — OCTREOTIDE ACETATE 30 MG IM KIT
30.0000 mg | PACK | Freq: Once | INTRAMUSCULAR | Status: AC
Start: 2017-09-15 — End: 2017-09-15
  Administered 2017-09-15: 30 mg via INTRAMUSCULAR

## 2017-09-15 NOTE — Telephone Encounter (Signed)
Gave avs and calendar for February and march 2019

## 2017-09-15 NOTE — Patient Instructions (Signed)

## 2017-09-20 LAB — 5 HIAA, QUANTITATIVE, URINE, 24 HOUR
5-HIAA, URINE: 11.6 mg/L
5-HIAA,QUANT.,24 HR URINE: 27.3 mg/(24.h) — AB (ref 0.0–14.9)

## 2017-10-13 ENCOUNTER — Inpatient Hospital Stay: Payer: Medicare Other | Attending: Hematology

## 2017-10-13 VITALS — BP 155/82 | HR 60 | Temp 97.8°F | Resp 18

## 2017-10-13 DIAGNOSIS — C7A029 Malignant carcinoid tumor of the large intestine, unspecified portion: Secondary | ICD-10-CM | POA: Diagnosis not present

## 2017-10-13 DIAGNOSIS — C7B02 Secondary carcinoid tumors of liver: Secondary | ICD-10-CM | POA: Insufficient documentation

## 2017-10-13 DIAGNOSIS — R197 Diarrhea, unspecified: Secondary | ICD-10-CM | POA: Insufficient documentation

## 2017-10-13 DIAGNOSIS — D500 Iron deficiency anemia secondary to blood loss (chronic): Secondary | ICD-10-CM

## 2017-10-13 MED ORDER — OCTREOTIDE ACETATE 30 MG IM KIT
PACK | INTRAMUSCULAR | Status: AC
  Filled 2017-10-13: qty 1

## 2017-10-13 MED ORDER — OCTREOTIDE ACETATE 30 MG IM KIT
30.0000 mg | PACK | Freq: Once | INTRAMUSCULAR | Status: AC
  Administered 2017-10-13: 30 mg via INTRAMUSCULAR

## 2017-10-13 NOTE — Patient Instructions (Signed)

## 2017-11-16 ENCOUNTER — Inpatient Hospital Stay: Payer: Medicare Other | Attending: Hematology

## 2017-11-16 DIAGNOSIS — C7B09 Secondary carcinoid tumors of other sites: Secondary | ICD-10-CM | POA: Diagnosis not present

## 2017-11-16 DIAGNOSIS — C7A029 Malignant carcinoid tumor of the large intestine, unspecified portion: Secondary | ICD-10-CM | POA: Insufficient documentation

## 2017-11-16 DIAGNOSIS — R197 Diarrhea, unspecified: Secondary | ICD-10-CM | POA: Diagnosis not present

## 2017-11-16 DIAGNOSIS — D500 Iron deficiency anemia secondary to blood loss (chronic): Secondary | ICD-10-CM

## 2017-11-16 DIAGNOSIS — C7B02 Secondary carcinoid tumors of liver: Secondary | ICD-10-CM | POA: Diagnosis not present

## 2017-11-16 MED ORDER — OCTREOTIDE ACETATE 30 MG IM KIT
PACK | INTRAMUSCULAR | Status: AC
  Filled 2017-11-16: qty 1

## 2017-11-16 MED ORDER — OCTREOTIDE ACETATE 30 MG IM KIT
30.0000 mg | PACK | Freq: Once | INTRAMUSCULAR | Status: AC
Start: 2017-11-16 — End: 2017-11-16
  Administered 2017-11-16: 30 mg via INTRAMUSCULAR

## 2017-11-16 NOTE — Patient Instructions (Signed)

## 2017-12-07 ENCOUNTER — Inpatient Hospital Stay: Payer: Medicare Other | Attending: Hematology

## 2017-12-07 ENCOUNTER — Ambulatory Visit (HOSPITAL_COMMUNITY)
Admission: RE | Admit: 2017-12-07 | Discharge: 2017-12-07 | Disposition: A | Discharge status: Home / Self Care | Payer: Medicare Other | Source: Ambulatory Visit | Attending: Hematology | Admitting: Hematology

## 2017-12-07 DIAGNOSIS — R197 Diarrhea, unspecified: Secondary | ICD-10-CM | POA: Diagnosis present

## 2017-12-07 DIAGNOSIS — C7A029 Malignant carcinoid tumor of the large intestine, unspecified portion: Secondary | ICD-10-CM

## 2017-12-07 DIAGNOSIS — I712 Thoracic aortic aneurysm, without rupture: Secondary | ICD-10-CM | POA: Insufficient documentation

## 2017-12-07 DIAGNOSIS — C7B02 Secondary carcinoid tumors of liver: Secondary | ICD-10-CM | POA: Diagnosis not present

## 2017-12-07 DIAGNOSIS — D509 Iron deficiency anemia, unspecified: Secondary | ICD-10-CM | POA: Insufficient documentation

## 2017-12-07 DIAGNOSIS — C787 Secondary malignant neoplasm of liver and intrahepatic bile duct: Secondary | ICD-10-CM | POA: Insufficient documentation

## 2017-12-07 LAB — COMPREHENSIVE METABOLIC PANEL
ALBUMIN: 3.5 g/dL (ref 3.5–5.0)
ALK PHOS: 95 U/L (ref 40–150)
ALT: 14 U/L (ref 0–55)
AST: 16 U/L (ref 5–34)
Anion gap: 8 (ref 3–11)
BUN: 13 mg/dL (ref 7–26)
CALCIUM: 9 mg/dL (ref 8.4–10.4)
CO2: 28 mmol/L (ref 22–29)
CREATININE: 0.85 mg/dL (ref 0.70–1.30)
Chloride: 104 mmol/L (ref 98–109)
GFR calc Af Amer: >60 mL/min (ref >60)
GFR calc non Af Amer: >60 mL/min (ref >60)
GLUCOSE: 90 mg/dL (ref 70–140)
Potassium: 3.9 mmol/L (ref 3.5–5.1)
SODIUM: 140 mmol/L (ref 136–145)
Total Bilirubin: 0.4 mg/dL (ref 0.2–1.2)
Total Protein: 6.3 g/dL — ABNORMAL LOW (ref 6.4–8.3)

## 2017-12-07 LAB — CBC WITH DIFFERENTIAL/PLATELET
BASOS PCT: 1 %
Basophils Absolute: 0.1 10*3/uL (ref 0.0–0.1)
EOS ABS: 0.1 10*3/uL (ref 0.0–0.5)
Eosinophils Relative: 2 %
HEMATOCRIT: 40.1 % (ref 38.4–49.9)
Hemoglobin: 13.1 g/dL (ref 13.0–17.1)
Lymphocytes Relative: 18 %
Lymphs Abs: 1.1 10*3/uL (ref 0.9–3.3)
MCH: 30.6 pg (ref 27.2–33.4)
MCHC: 32.7 g/dL (ref 32.0–36.0)
MCV: 93.7 fL (ref 79.3–98.0)
MONOS PCT: 11 %
Monocytes Absolute: 0.7 10*3/uL (ref 0.1–0.9)
NEUTROS ABS: 4 10*3/uL (ref 1.5–6.5)
Neutrophils Relative %: 68 %
Platelets: 240 10*3/uL (ref 140–400)
RBC: 4.28 MIL/uL (ref 4.20–5.82)
RDW: 13.7 % (ref 11.0–14.6)
WBC: 6 10*3/uL (ref 4.0–10.3)

## 2017-12-07 MED ORDER — IOPAMIDOL (ISOVUE-300) INJECTION 61%
100.0000 mL | Freq: Once | INTRAVENOUS | Status: AC | PRN
  Administered 2017-12-07: 100 mL via INTRAVENOUS

## 2017-12-07 MED ORDER — IOPAMIDOL (ISOVUE-300) INJECTION 61%
INTRAVENOUS | Status: AC
  Filled 2017-12-07: qty 100

## 2017-12-07 MED ORDER — SODIUM CHLORIDE 0.9 % IJ SOLN
INTRAMUSCULAR | Status: AC
  Filled 2017-12-07: qty 50

## 2017-12-08 LAB — CHROMOGRANIN A: Chromogranin A: 33 nmol/L — ABNORMAL HIGH (ref 0–5)

## 2017-12-12 NOTE — Progress Notes (Signed)
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 Extensive confluent ileal colic adenopathy extending chronically along the superior mesenteric vein, multiple hepatic metastasis      02/18/2015 Initial Diagnosis    Carcinoid tumor of colon, malignant, with liver metastasis      02/18/2015 Initial Biopsy    Liver biopsy showed metastatic low-grade neuroendocrine tumor (carcinoid). The tumor is positive for CD X2, CD 56, chromogranin, and synaptophysin.      03/05/2015 -  Chemotherapy    Sandostatin 30 mg IM every 4 weeks      03/06/2015 Imaging    CT chest showed no evidence of metastasis in the chest. 4.4 cm ascending aortic aneurysm.      09/09/2015 Imaging    Restaging CT abdomen and pelvis with contrast showed interval response of hepatic metastasis. Most are small and, some are stable. Slight interval decrease in mesenteric adenopathy and primary colon tumor.       12/06/2016 Imaging    CT CAP w contrast IMPRESSION: 1. Overall, the extent of metastatic disease to the liver and abdominal/retroperitoneal lymphatics is similar to the prior examination 08/19/2016. 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 2. Multiple small pulmonary nodules appear unchanged in size, number and distribution, favored to  be benign. 3. Aortic atherosclerosis, in addition to 2 vessel coronary artery disease. Assessment for potential risk factor modification, dietary therapy or pharmacologic therapy may be warranted, if clinically indicated. 4. In addition, there is dilatation of the aortic root (4.7 cm in diameter at the level of the sinuses of Valsalva) and ectasia of the ascending thoracic aorta measuring 4.4 cm in diameter. Recommend annual imaging followup by CTA or MRA. This recommendation follows 2010 ACCF/AHA/AATS/ACR/ASA/SCA/SCAI/SIR/STS/SVM Guidelines for the Diagnosis and Management of Patients with Thoracic Aortic Disease. Circulation. 2010; 121: O294-T654. 5. Trace volume of ascites. 6. Additional incidental findings, as above.       05/31/2017 Imaging    CT CAP W Contrast 05/31/17 IMPRESSION: 1. Overall the extent of metastatic disease to the liver and abdominal adenopathy is similar to previous exam from 12/06/2016. 2. 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 3. Stable small pulmonary nodules. 4.  Aortic Atherosclerosis (ICD10-I70.0). 5. Dilatation of the ascending thoracic aorta. Unchanged. Recommend annual imaging followup by CTA or MRA. This recommendation follows 2010 ACCF/AHA/AATS/ACR/ASA/SCA/SCAI/SIR/STS/SVM Guidelines for the Diagnosis and Management of Patients with Thoracic Aortic Disease. Circulation. 2010; 121: e266-e369 6. Trace ascites.      12/07/2017 Imaging    CT CAP W Contrast 12/07/17 IMPRESSION: Chest Impression: 1. No evidence of thoracic metastasis. 2. Stable ascending thoracic aortic aneurysm at 4.4 cm. Recommend attention on oncology surveillance. Abdomen / Pelvis Impression: 1. Mild interval increase in size of multifocal hepatic metastasis.  2. Large central small bowel mesenteric mass is unchanged 3. No bowel obstruction. 4. No skeletal lesions        HISTORY OF PRESENTING ILLNESS:    Ryan Cowan 72 y.o. male is here because of recently diagnosed metastatic carcinoid tumor. I saw him in the hospital initially, this is his first follow-up after his hospital discharge. He presents to the clinic with his 3 sisters.  He has been having diarrhea, fatigue and dyspnea on exertion for the past 2-3 months. His diarrhea is watery stool, no hematochezia or melena. He denies significant abdominal pain, nausea, or change of his appetite. He lost about 20 pounds in the past 3 months. No fever, chills or night sweats. He has mild chronic dry cough which has not changed lately. He presented to his primary care physician 2-3 weeks ago, had lab work done, and was found to have hemoglobin 6.7. He was sent to emergency room on 02/16/2015 and was admitted. CT of abdomen and pelvis showed a cecal mass, adenopathy, and multiple liver lesions, which is highly suspicious for metastatic colon cancer. He received 2 units of blood transfusion, IV dextran 250 mg, and underwent liver biopsy. He was discharged home after the biopsy.  He is single, no children. He was independent, but not very physically active. He lives in a trailer. He has 3 sisters and parents live in [place] area. No family history of colon cancer or other malignancy. He moved to his mother's house, and is now very close to his sisters. His mom lives in a nursing home.  He feels better lately, gained some weight. His diarrhea is unchanged. He has mild intermittent low abdominal pain after bowel movement. No other new complaints.  CURRENT THERAPY:  Sandostatin 30 mg injection every 4 weeks  Interim history: Mr. Andreoli returns for follow up. He presents to clinic with his family. He notes he feels well. He notes [facility] is trying to see if he can get them to pay for his medicine. He is paying $1100+ for injections because his deductible is $5000. [facility] [place] about doing injections with them. He has not talked to  [employer].   On review of symptoms, pt notes he denies any new pain. He is eating well. He denies issues with bowel movements, bloating or abdominal pain. He notes he will feel uncomfortable when he eats too much. He is working to adjust what foods work for him.     MEDICAL HISTORY:  Past Medical History:  Diagnosis Date  . Liver metastases ([place])     SURGICAL HISTORY: Past Surgical History:  Procedure Laterality Date  . APPENDECTOMY    . KNEE SURGERY    . SHOULDER SURGERY      SOCIAL HISTORY: Social History   Socioeconomic History  . Marital status: Single    Spouse name: Not on file  . Number of children: Not on file  . Years of education: Not on file  . Highest education level: Not on file  Social Needs  . Financial resource strain: Not on file  . Food insecurity - worry: Not on file  . Food insecurity - inability: Not on file  . Transportation needs - medical: Not on file  . Transportation needs - non-medical: Not on file  Occupational History  . Not on file  Tobacco Use  . Smoking status: Former Smoker    Packs/day: 2.50    Years: 50.00    Pack  years: 125.00    Last attempt to quit: 10/04/2011    Years since quitting: 6.2  . Smokeless tobacco: Never Used  Substance and Sexual Activity  . Alcohol use: No  . Drug use: Not on file  . Sexual activity: Not on file  Other Topics Concern  . Not on file  Social History Narrative   Lives alone in his mother's home (in SNF)   Has #3 sisters, [name] is POA   Prior employed in [employer] and was in [employer] 4 years   Very detail oriented in conversation    FAMILY HISTORY: Family History  Problem Relation Age of Onset  . CAD Mother   . CAD Father   . Hypertension Father   . Hypertension Sister     ALLERGIES:  is allergic to codeine.  MEDICATIONS:  Current Outpatient Medications  Medication Sig Dispense Refill  . Multiple Vitamin (MULTIVITAMIN WITH MINERALS) TABS tablet Take  1 tablet by mouth daily. Centrum Silver 50 plus    . naproxen sodium (ANAPROX) 220 MG tablet Take 220 mg by mouth 2 (two) times daily as needed (pain).    Marland Kitchen OVER THE COUNTER MEDICATION Place 1 application into both nostrils daily as needed (congestion). Vicks inhaler stick     No current facility-administered medications for this visit.     REVIEW OF SYSTEMS:   Constitutional: Denies fevers, chills or abnormal night sweats Eyes: Denies blurriness of vision, double vision or watery eyes Ears, nose, mouth, throat, and face: Denies mucositis or sore throat Respiratory: Denies cough, dyspnea or wheezes Cardiovascular: Denies palpitation, chest discomfort or lower extremity swelling Gastrointestinal:  Denies nausea, heartburn or change in bowel habits  Skin: Denies abnormal skin rashes Lymphatics: Denies new lymphadenopathy or easy bruising Neurological:Denies numbness, tingling or new weaknesses Behavioral/Psych: Mood is stable, no new changes  All other systems were reviewed with the patient and are negative.  PHYSICAL EXAMINATION:  ECOG PERFORMANCE STATUS: 1  Vitals:   12/14/17 1354  BP: 140/76  Pulse: (!) 52  Resp: 18  Temp: 98.1 F (36.7 C)  SpO2: 98%   Filed Weights   12/14/17 1354  Weight: 147 lb 14.4 oz (67.1 kg)    GENERAL:alert, no distress and comfortable SKIN: skin color, texture, turgor are normal, no rashes or significant lesions EYES: normal, conjunctiva are pink and non-injected, sclera clear OROPHARYNX:no exudate, no erythema and lips, buccal mucosa, and tongue normal  NECK: supple, thyroid normal size, non-tender, without nodularity LYMPH:  no palpable lymphadenopathy in the cervical, axillary or inguinal LUNGS: clear to auscultation and percussion with normal breathing effort HEART: regular rate & rhythm and no murmurs and no lower extremity edema  ABDOMEN:abdomen soft, non-tender and normal bowel sounds, no organomegaly  Musculoskeletal:no cyanosis of  digits and no clubbing  PSYCH: alert & oriented x 3 with fluent speech NEURO: no focal motor/sensory deficits  LABORATORY DATA:  I have reviewed the data as listed CBC Latest Ref Rng & Units 12/07/2017 09/08/2017 05/26/2017  WBC 4.0 - 10.3 K/uL 6.0 5.3 5.3  Hemoglobin 13.0 - 17.1 g/dL 13.1 13.0 13.4  Hematocrit 38.4 - 49.9 % 40.1 39.0 40.0  Platelets 140 - 400 K/uL 240 221 200   CMP Latest Ref Rng & Units 12/07/2017 09/08/2017 05/26/2017  Glucose 70 - 140 mg/dL 90 94 85  BUN 7 - 26 mg/dL 13 19.9 13.8  Creatinine 0.70 - 1.30 mg/dL 0.85 0.8 0.9  Sodium 136 - 145 mmol/L 140 141 142  Potassium 3.5 -  5.1 mmol/L 3.9 3.8 3.9  Chloride 98 - 109 mmol/L 104 - -  CO2 22 - 29 mmol/L 28 25 27   Calcium 8.4 - 10.4 mg/dL 9.0 8.8 9.2  Total Protein 6.4 - 8.3 g/dL 6.3(L) 6.3(L) 6.2(L)  Total Bilirubin 0.2 - 1.2 mg/dL 0.4 0.42 0.44  Alkaline Phos 40 - 150 U/L 95 84 94  AST 5 - 34 U/L 16 17 17   ALT 0 - 55 U/L 14 11 12     Chromogranin A (<=15ng/ml)  04/03/2015: 93 05/29/2015: 43 08/21/2015: 43 12/07/2015: 8 03/04/2016: 11 08/19/2016: 8 12/02/2016: 13 03/03/2017: 15 05/26/17: 16 09/08/17: 15 12/07/17: 33  24 hour urine 5-HIAA (<6.0 mg/24h) 04/03/2015: 51.3 09/17/2016: 12.9 12/11/2015: 23 06/24/2016: 23.2 12/05/2016: 18.4 03/03/2017: 28.4 05/26/17: 17.7 09/15/17: 27.3   PATHOLOGY REPORT: Diagnosis 02/18/2015 Liver, needle/core biopsy, left lobe - METASTATIC LOW GRADE NEUROENDOCRINE TUMOR (CARCINOID). - SEE COMMENT.   RADIOGRAPHIC STUDIES: I have personally reviewed the radiological images as listed and agreed with the findings in the report.   CT CAP W Contrast 12/07/17 IMPRESSION: Chest Impression: 1. No evidence of thoracic metastasis. 2. Stable ascending thoracic aortic aneurysm at 4.4 cm. Recommend attention on oncology surveillance. Abdomen / Pelvis Impression: 1. Mild interval increase in size of multifocal hepatic metastasis. 2. Large central small bowel mesenteric mass is unchanged 3. No  bowel obstruction. 4. No skeletal lesions  CT CAP W Contrast 05/31/17 IMPRESSION: 1. Overall the extent of metastatic disease to the liver and abdominal adenopathy is similar to previous exam from 12/06/2016. 2. 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 3. Stable small pulmonary nodules. 4.  Aortic Atherosclerosis (ICD10-I70.0). 5. Dilatation of the ascending thoracic aorta. Unchanged. Recommend annual imaging followup by CTA or MRA. This recommendation follows 2010 ACCF/AHA/AATS/ACR/ASA/SCA/SCAI/SIR/STS/SVM Guidelines for the Diagnosis and Management of Patients with Thoracic Aortic Disease. Circulation. 2010; 121: e266-e369 6. Trace ascites.  Ct chest, abdomen Pelvis W Contrast 08/19/2016 IMPRESSION: 1. Overall, interval progression of multifocal liver metastases. 2. No wall mass within the central mesenteric, which obstructs the superior mesenteric vein, is not significantly changed when compared with previous exam. Associated mesenteric edema and small bowel wall thickening noted in the right lower quadrant. 3. Small nonspecific nodules are unchanged when compared with previous exam. 4. Ascending thoracic aortic aneurysm measuring 4 cm. Recommend annual imaging followup by CTA or MRA. This recommendation follows 2010 ACCF/AHA/AATS/ACR/ASA/SCA/SCAI/SIR/STS/SVM Guidelines for the Diagnosis and Management of Patients with Thoracic Aortic Disease. Circulation. 2010; 121: V616-W737  Ct chest, abdomen Pelvis W Contrast 12/06/16 IMPRESSION: 1. Overall, the extent of metastatic disease to the liver and abdominal/retroperitoneal lymphatics is similar to the prior examination 08/19/2016. 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 2. Multiple small pulmonary nodules appear unchanged in size, number and distribution, favored to be benign. 3.  Aortic atherosclerosis, in addition to 2 vessel coronary artery disease. Assessment for potential risk factor modification, dietary therapy or pharmacologic therapy may be warranted, if clinically indicated. 4. In addition, there is dilatation of the aortic root (4.7 cm in diameter at the level of the sinuses of Valsalva) and ectasia of the ascending thoracic aorta measuring 4.4 cm in diameter. Recommend annual imaging followup by CTA or MRA. This recommendation follows 2010 ACCF/AHA/AATS/ACR/ASA/SCA/SCAI/SIR/STS/SVM Guidelines for the Diagnosis and Management of Patients with Thoracic Aortic Disease. Circulation. 2010; 121: T062-I948. 5. Trace volume of ascites. 6. Additional incidental findings, as above.  ASSESSMENT & PLAN: 72 y.o.  Caucasian male without significant past medical history  1. 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 The liver biopsy surgical past findings were reviewed with them in great details. -The natural history of carcinoid tumor was reviewed with them. It is an indolent tumor, unfortunately has metastasized to liver, it is incurable at this stage. -The goal of therapy is tumor control, and prolong his life. -He has very bulky abdominal adenopathy, potentially can cause mall bowel obstruction and SMV occlusion. This was discussed with the patient and his family -I previously discussed his restaging scan from 12/06/2016, which showed stable disease. No other new metastasis. -His initial serum chromogranin was previously elevated at 93, has come down significantly, corresponding to good response. The level has slightly increased lately, will continue monitoring.  -We also previously discussed about the new treatment option Lutathera therapy for metastatic neuroendocrine tumors, which has been approved by FDA in Jan 2018. He would be a good  candidate if he progresses on Sandostatin injection -We discussed his 06/01/17 CT CAP that showed stable disease.  -We reviewed his restaging 12/07/17 CT CAP images, and compared to previous CT scan, it has showed mild disease progression in liver mets and enlarged lymph nodes next to liver and mesentery. There is no evidence of metastasis in chest or bones. -I discussed he has been on Sandostatin injections over 2.5 years, I again discussed the option of target therapy with Lutathera treatment for every 2 months for 4 cycles. I discussed the side effects in great detail, including lowered blood counts, fatigue, worsened liver function and nausea. If pt opts to switch treatment I will refer him to Dr. Leonia Reeves. I provided him with reading material on Lutathera. He will take time to consider this -When he decides to change treatment, he will need a DOLATATE-PET Scan prior to make sure this treatment will benefit him.  -Continue on monthly Sandostatin injections for now.  Due to his high deductible and co-pay, he will talk to our financial office staff today. -Labs from last week reviewed with pt. CBC and CMP overall WNL, his Chromogranin A overall stable.  -He is fine to have new shingles shot  -F/u in 12 weeks    2. 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  -I previously repeated iron studies normal in June 2017. He is not on oral iron supplement. -I previously encouraged him to exercise more to help with energy level  -His iron study on (05/26/17) was normal, anemia currently resolved   3. Diarrhea  -secondary to #1, overall much improved  -I previously encouraged him to use Imodium as needed.  -His diarrhea has improved and almost controlled, he goes 2-3 a day.  -Not mentioned in visit today. Likely well controled or resolved.   4.  Possible right inguinal hernia - Patient complains of having a "knot" in th groin area. - I have previously dicussed with the patient and we have agreed to just continue watching the area as long as no pain is present.   5. Financial Assistance  -He is paying $1100+ for injections because his deductible is $5000.  -He is currently working with [facility] to see their oncology in [place] about  doing injections with them.  -I recommend in the meantime he sees our financial staff about any grants or resources available to him. He will see them today.  Plan  -Proceed with Sandostatin injection today  -Injection in 4, 8 and 12 weeks  -Lab and f/u in 12 weeks     All questions were answered. The patient knows to call the clinic with any problems, questions or concerns.  I spent 25 minutes counseling the patient face to face. The total time spent in the appointment was 30 minutes and more than 50% was on counseling.  This document serves as a record of services personally performed by [name], MD. It was created on her behalf by Joslyn Devon, a trained medical scribe. The creation of this record is based on the scribe's personal observations and the provider's statements to them.    I have reviewed the above documentation for accuracy and completeness, and I agree with the above.     [name], MD 12/14/2017

## 2017-12-14 ENCOUNTER — Inpatient Hospital Stay: Payer: Medicare Other

## 2017-12-14 ENCOUNTER — Encounter: Payer: Self-pay | Admitting: Hematology

## 2017-12-14 ENCOUNTER — Telehealth: Payer: Self-pay

## 2017-12-14 ENCOUNTER — Inpatient Hospital Stay (HOSPITAL_BASED_OUTPATIENT_CLINIC_OR_DEPARTMENT_OTHER): Payer: Medicare Other | Admitting: Hematology

## 2017-12-14 VITALS — BP 140/76 | HR 52 | Temp 98.1°F | Resp 18 | Ht 74.0 in | Wt 147.9 lb

## 2017-12-14 DIAGNOSIS — C7A029 Malignant carcinoid tumor of the large intestine, unspecified portion: Secondary | ICD-10-CM | POA: Diagnosis not present

## 2017-12-14 DIAGNOSIS — C7B02 Secondary carcinoid tumors of liver: Secondary | ICD-10-CM

## 2017-12-14 DIAGNOSIS — D509 Iron deficiency anemia, unspecified: Secondary | ICD-10-CM

## 2017-12-14 DIAGNOSIS — D500 Iron deficiency anemia secondary to blood loss (chronic): Secondary | ICD-10-CM

## 2017-12-14 DIAGNOSIS — R197 Diarrhea, unspecified: Secondary | ICD-10-CM

## 2017-12-14 DIAGNOSIS — C7B01 Secondary carcinoid tumors of distant lymph nodes: Secondary | ICD-10-CM

## 2017-12-14 MED ORDER — OCTREOTIDE ACETATE 30 MG IM KIT
30.0000 mg | PACK | Freq: Once | INTRAMUSCULAR | Status: AC
  Administered 2017-12-14: 30 mg via INTRAMUSCULAR

## 2017-12-14 NOTE — Telephone Encounter (Signed)
Printed avs and calender of upcoming appoinment. Per 3/14 los

## 2017-12-14 NOTE — Progress Notes (Signed)
Patient came in to inquire about assistance for Sandostatin injection copay assistance.  He said his portion is over $1000 for him to pay out of pocket each month. Asked patient about his ded/OOP amounts and he isn't sure what they are. Asked patient if he received an EOB from [facility] with that amount on it and he states he has not received anything other than a bill.  Reviewed foundation fund availability and there are currently no funds available for his diagnosis or for the injection alone with his Medicare plan.  Advised him that I will reach out to Rob to see if there is any assistance available through the Manufacturer based on high copay and have Rob contact him if anything is available. Also made him aware of the Access One program where he may be able to take his bills and combine them in one and make one monthly payment. He states that is what he is doing now.  He has my card for any other financial questions or concerns.Marland Kitchen

## 2017-12-14 NOTE — Patient Instructions (Signed)

## 2017-12-18 ENCOUNTER — Telehealth: Payer: Self-pay | Admitting: *Deleted

## 2017-12-18 NOTE — Telephone Encounter (Signed)
Pt called wanting to know the name of radiologist to discuss about the procedure that Dr. Burr Medico had informed pt at last office visit.  Spoke with pt, and was informed that pt has not decided about [place] yet.   Pt has agreed to have a consult with the radiologist to have more information about the procedure.  Dr. Burr Medico notified. Pt's    Phone      [phone].

## 2017-12-28 ENCOUNTER — Encounter: Payer: Self-pay | Admitting: Pharmacy Technician

## 2017-12-28 NOTE — Progress Notes (Signed)
The patient is approved for drug assistance by [employer] for Sandostatin 30mg . Enrollment will be until 10/02/18 and is based on excessive OOP. Drug replacement will begin on DOS 01/11/18.

## 2017-12-29 ENCOUNTER — Telehealth: Payer: Self-pay | Admitting: Hematology

## 2017-12-29 NOTE — Telephone Encounter (Signed)
FAXED RECORDS TO [place]

## 2018-01-11 ENCOUNTER — Inpatient Hospital Stay: Payer: Medicare Other | Attending: Hematology

## 2018-01-11 VITALS — BP 145/81 | HR 51 | Temp 98.4°F | Resp 18

## 2018-01-11 DIAGNOSIS — C7B02 Secondary carcinoid tumors of liver: Secondary | ICD-10-CM | POA: Insufficient documentation

## 2018-01-11 DIAGNOSIS — D500 Iron deficiency anemia secondary to blood loss (chronic): Secondary | ICD-10-CM

## 2018-01-11 DIAGNOSIS — D509 Iron deficiency anemia, unspecified: Secondary | ICD-10-CM | POA: Insufficient documentation

## 2018-01-11 DIAGNOSIS — C7B01 Secondary carcinoid tumors of distant lymph nodes: Secondary | ICD-10-CM | POA: Insufficient documentation

## 2018-01-11 DIAGNOSIS — C7A029 Malignant carcinoid tumor of the large intestine, unspecified portion: Secondary | ICD-10-CM | POA: Insufficient documentation

## 2018-01-11 DIAGNOSIS — R197 Diarrhea, unspecified: Secondary | ICD-10-CM | POA: Insufficient documentation

## 2018-01-11 MED ORDER — OCTREOTIDE ACETATE 30 MG IM KIT
30.0000 mg | PACK | Freq: Once | INTRAMUSCULAR | Status: AC
  Administered 2018-01-11: 30 mg via INTRAMUSCULAR

## 2018-01-11 MED ORDER — OCTREOTIDE ACETATE 30 MG IM KIT
PACK | INTRAMUSCULAR | Status: AC
  Filled 2018-01-11: qty 1

## 2018-02-08 ENCOUNTER — Inpatient Hospital Stay: Payer: Medicare Other | Attending: Hematology

## 2018-02-08 VITALS — BP 156/86 | HR 55 | Temp 98.0°F | Resp 18

## 2018-02-08 DIAGNOSIS — C7A029 Malignant carcinoid tumor of the large intestine, unspecified portion: Secondary | ICD-10-CM | POA: Insufficient documentation

## 2018-02-08 DIAGNOSIS — C7B01 Secondary carcinoid tumors of distant lymph nodes: Secondary | ICD-10-CM | POA: Insufficient documentation

## 2018-02-08 DIAGNOSIS — C7B02 Secondary carcinoid tumors of liver: Secondary | ICD-10-CM | POA: Insufficient documentation

## 2018-02-08 DIAGNOSIS — R197 Diarrhea, unspecified: Secondary | ICD-10-CM | POA: Diagnosis present

## 2018-02-08 DIAGNOSIS — D500 Iron deficiency anemia secondary to blood loss (chronic): Secondary | ICD-10-CM

## 2018-02-08 MED ORDER — OCTREOTIDE ACETATE 30 MG IM KIT
PACK | INTRAMUSCULAR | Status: AC
Start: 2018-02-08 — End: ?
  Filled 2018-02-08: qty 1

## 2018-02-08 MED ORDER — OCTREOTIDE ACETATE 30 MG IM KIT
30.0000 mg | PACK | Freq: Once | INTRAMUSCULAR | Status: AC
  Administered 2018-02-08: 30 mg via INTRAMUSCULAR

## 2018-02-08 NOTE — Patient Instructions (Signed)

## 2018-03-08 ENCOUNTER — Telehealth: Payer: Self-pay | Admitting: Hematology

## 2018-03-08 ENCOUNTER — Encounter: Payer: Self-pay | Admitting: Hematology

## 2018-03-08 ENCOUNTER — Inpatient Hospital Stay (HOSPITAL_BASED_OUTPATIENT_CLINIC_OR_DEPARTMENT_OTHER): Payer: Medicare Other | Admitting: Hematology

## 2018-03-08 ENCOUNTER — Inpatient Hospital Stay: Payer: Medicare Other

## 2018-03-08 ENCOUNTER — Inpatient Hospital Stay: Payer: Medicare Other | Attending: Hematology

## 2018-03-08 VITALS — BP 153/87 | HR 60 | Temp 98.3°F | Resp 17 | Ht 74.0 in | Wt 149.1 lb

## 2018-03-08 DIAGNOSIS — C7A029 Malignant carcinoid tumor of the large intestine, unspecified portion: Secondary | ICD-10-CM

## 2018-03-08 DIAGNOSIS — Z87891 Personal history of nicotine dependence: Secondary | ICD-10-CM | POA: Diagnosis not present

## 2018-03-08 DIAGNOSIS — G4700 Insomnia, unspecified: Secondary | ICD-10-CM | POA: Diagnosis not present

## 2018-03-08 DIAGNOSIS — C7B02 Secondary carcinoid tumors of liver: Secondary | ICD-10-CM

## 2018-03-08 DIAGNOSIS — D509 Iron deficiency anemia, unspecified: Secondary | ICD-10-CM | POA: Insufficient documentation

## 2018-03-08 DIAGNOSIS — C7B09 Secondary carcinoid tumors of other sites: Secondary | ICD-10-CM

## 2018-03-08 DIAGNOSIS — R197 Diarrhea, unspecified: Secondary | ICD-10-CM | POA: Insufficient documentation

## 2018-03-08 DIAGNOSIS — D500 Iron deficiency anemia secondary to blood loss (chronic): Secondary | ICD-10-CM

## 2018-03-08 LAB — CBC WITH DIFFERENTIAL/PLATELET
Basophils Absolute: 0.1 10*3/uL (ref 0.0–0.1)
Basophils Relative: 1 %
EOS ABS: 0.1 10*3/uL (ref 0.0–0.5)
Eosinophils Relative: 2 %
HCT: 38.7 % (ref 38.4–49.9)
HEMOGLOBIN: 12.9 g/dL — AB (ref 13.0–17.1)
LYMPHS ABS: 1 10*3/uL (ref 0.9–3.3)
Lymphocytes Relative: 16 %
MCH: 30.9 pg (ref 27.2–33.4)
MCHC: 33.3 g/dL (ref 32.0–36.0)
MCV: 92.8 fL (ref 79.3–98.0)
Monocytes Absolute: 0.6 10*3/uL (ref 0.1–0.9)
Monocytes Relative: 10 %
NEUTROS PCT: 71 %
Neutro Abs: 4.5 10*3/uL (ref 1.5–6.5)
Platelets: 193 10*3/uL (ref 140–400)
RBC: 4.17 MIL/uL — AB (ref 4.20–5.82)
RDW: 13.7 % (ref 11.0–14.6)
WBC: 6.4 10*3/uL (ref 4.0–10.3)

## 2018-03-08 LAB — COMPREHENSIVE METABOLIC PANEL
ALK PHOS: 86 U/L (ref 40–150)
ALT: 6 U/L (ref 0–55)
AST: 16 U/L (ref 5–34)
Albumin: 3.6 g/dL (ref 3.5–5.0)
Anion gap: 7 (ref 3–11)
BILIRUBIN TOTAL: 0.3 mg/dL (ref 0.2–1.2)
BUN: 20 mg/dL (ref 7–26)
CALCIUM: 8.8 mg/dL (ref 8.4–10.4)
CO2: 27 mmol/L (ref 22–29)
CREATININE: 0.89 mg/dL (ref 0.70–1.30)
Chloride: 107 mmol/L (ref 98–109)
Glucose, Bld: 75 mg/dL (ref 70–140)
Potassium: 4.2 mmol/L (ref 3.5–5.1)
Sodium: 141 mmol/L (ref 136–145)
Total Protein: 6.1 g/dL — ABNORMAL LOW (ref 6.4–8.3)

## 2018-03-08 MED ORDER — OCTREOTIDE ACETATE 30 MG IM KIT
PACK | INTRAMUSCULAR | Status: AC
  Filled 2018-03-08: qty 1

## 2018-03-08 MED ORDER — OCTREOTIDE ACETATE 30 MG IM KIT
30.0000 mg | PACK | Freq: Once | INTRAMUSCULAR | Status: AC
  Administered 2018-03-08: 30 mg via INTRAMUSCULAR

## 2018-03-08 NOTE — Progress Notes (Signed)
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 Extensive confluent ileal colic adenopathy extending chronically along the superior mesenteric vein, multiple hepatic metastasis      02/18/2015 Initial Diagnosis    Carcinoid tumor of colon, malignant, with liver metastasis      02/18/2015 Initial Biopsy    Liver biopsy showed metastatic low-grade neuroendocrine tumor (carcinoid). The tumor is positive for CD X2, CD 56, chromogranin, and synaptophysin.      03/05/2015 -  Chemotherapy    Sandostatin 30 mg IM every 4 weeks      03/06/2015 Imaging    CT chest showed no evidence of metastasis in the chest. 4.4 cm ascending aortic aneurysm.      09/09/2015 Imaging    Restaging CT abdomen and pelvis with contrast showed interval response of hepatic metastasis. Most are small and, some are stable. Slight interval decrease in mesenteric adenopathy and primary colon tumor.       12/06/2016 Imaging    CT CAP w contrast IMPRESSION: 1. Overall, the extent of metastatic disease to the liver and abdominal/retroperitoneal lymphatics is similar to the prior examination 08/19/2016. 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 2. Multiple small pulmonary nodules appear unchanged in size, number and distribution, favored to be  benign. 3. Aortic atherosclerosis, in addition to 2 vessel coronary artery disease. Assessment for potential risk factor modification, dietary therapy or pharmacologic therapy may be warranted, if clinically indicated. 4. In addition, there is dilatation of the aortic root (4.7 cm in diameter at the level of the sinuses of Valsalva) and ectasia of the ascending thoracic aorta measuring 4.4 cm in diameter. Recommend annual imaging followup by CTA or MRA. This recommendation follows 2010 ACCF/AHA/AATS/ACR/ASA/SCA/SCAI/SIR/STS/SVM Guidelines for the Diagnosis and Management of Patients with Thoracic Aortic Disease. Circulation. 2010; 121: D326-Z124. 5. Trace volume of ascites. 6. Additional incidental findings, as above.       05/31/2017 Imaging    CT CAP W Contrast 05/31/17 IMPRESSION: 1. Overall the extent of metastatic disease to the liver and abdominal adenopathy is similar to previous exam from 12/06/2016. 2. 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 3. Stable small pulmonary nodules. 4.  Aortic Atherosclerosis (ICD10-I70.0). 5. Dilatation of the ascending thoracic aorta. Unchanged. Recommend annual imaging followup by CTA or MRA. This recommendation follows 2010 ACCF/AHA/AATS/ACR/ASA/SCA/SCAI/SIR/STS/SVM Guidelines for the Diagnosis and Management of Patients with Thoracic Aortic Disease. Circulation. 2010; 121: e266-e369 6. Trace ascites.      12/07/2017 Imaging    CT CAP W Contrast 12/07/17 IMPRESSION: Chest Impression: 1. No evidence of thoracic metastasis. 2. Stable ascending thoracic aortic aneurysm at 4.4 cm. Recommend attention on oncology surveillance. Abdomen / Pelvis Impression: 1. Mild interval increase in size of multifocal hepatic metastasis.  2. Large central small bowel mesenteric mass is unchanged 3. No bowel obstruction. 4. No skeletal lesions        HISTORY OF PRESENTING ILLNESS:    Ryan Cowan 72 y.o. male is here because of recently diagnosed metastatic carcinoid tumor. I saw him in the hospital initially, this is his first follow-up after his hospital discharge. He presents to the clinic with his 3 sisters.  He has been having diarrhea, fatigue and dyspnea on exertion for the past 2-3 months. His diarrhea is watery stool, no hematochezia or melena. He denies significant abdominal pain, nausea, or change of his appetite. He lost about 20 pounds in the past 3 months. No fever, chills or night sweats. He has mild chronic dry cough which has not changed lately. He presented to his primary care physician 2-3 weeks ago, had lab work done, and was found to have hemoglobin 6.7. He was sent to emergency room on 02/16/2015 and was admitted. CT of abdomen and pelvis showed a cecal mass, adenopathy, and multiple liver lesions, which is highly suspicious for metastatic colon cancer. He received 2 units of blood transfusion, IV dextran 250 mg, and underwent liver biopsy. He was discharged home after the biopsy.  He is single, no children. He was independent, but not very physically active. He lives in a trailer. He has 3 sisters and parents live in [place] area. No family history of colon cancer or other malignancy. He moved to his mother's house, and is now very close to his sisters. His mom lives in a nursing home.  He feels better lately, gained some weight. His diarrhea is unchanged. He has mild intermittent low abdominal pain after bowel movement. No other new complaints.  CURRENT THERAPY: Sandostatin 30 mg injection every 4 weeks  Interim history:  Mr. Ryan Cowan returns for follow up. He presents to clinic with his family.  He notes stable with no new changes overall. He notes he has been more tired. He notes he has been able to eat better lately. He notes he still has trouble sleeping, which has been occurring since before his diagnosis.    On review of symptoms, pt notes abdominal  cramping only when he eats too much. He notes he sleep 1-1.5 hours at a time.     MEDICAL HISTORY:  Past Medical History:  Diagnosis Date  . Liver metastases ([place])     SURGICAL HISTORY: Past Surgical History:  Procedure Laterality Date  . APPENDECTOMY    . KNEE SURGERY    . SHOULDER SURGERY      SOCIAL HISTORY: Social History   Socioeconomic History  . Marital status: Single    Spouse name: Not on file  . Number of children: Not on file  . Years of education: Not on file  . Highest education level: Not on file  Occupational History  . Not on file  Social Needs  . Financial resource strain: Not on file  . Food insecurity:    Worry: Not on file    Inability: Not on file  . Transportation needs:    Medical: Not on file    Non-medical: Not on file  Tobacco Use  . Smoking status: Former Smoker    Packs/day: 2.50    Years: 50.00    Pack years: 125.00    Last attempt to quit: 10/04/2011    Years since quitting: 6.4  . Smokeless tobacco: Never Used  Substance and Sexual Activity  . Alcohol use: No  . Drug  use: Not on file  . Sexual activity: Not on file  Lifestyle  . Physical activity:    Days per week: Not on file    Minutes per session: Not on file  . Stress: Not on file  Relationships  . Social connections:    Talks on phone: Not on file    Gets together: Not on file    Attends religious service: Not on file    Active member of club or organization: Not on file    Attends meetings of clubs or organizations: Not on file    Relationship status: Not on file  . Intimate partner violence:    Fear of current or ex partner: Not on file    Emotionally abused: Not on file    Physically abused: Not on file    Forced sexual activity: Not on file  Other Topics Concern  . Not on file  Social History Narrative   Lives alone in his mother's home (in SNF)   Has #3 sisters, [name] is POA   Prior employed in [employer] and was in [employer] 4 years   Very  detail oriented in conversation    FAMILY HISTORY: Family History  Problem Relation Age of Onset  . CAD Mother   . CAD Father   . Hypertension Father   . Hypertension Sister     ALLERGIES:  is allergic to codeine.  MEDICATIONS:  Current Outpatient Medications  Medication Sig Dispense Refill  . Multiple Vitamin (MULTIVITAMIN WITH MINERALS) TABS tablet Take 1 tablet by mouth daily. Centrum Silver 50 plus    . naproxen sodium (ANAPROX) 220 MG tablet Take 220 mg by mouth 2 (two) times daily as needed (pain).    Marland Kitchen OVER THE COUNTER MEDICATION Place 1 application into both nostrils daily as needed (congestion). Vicks inhaler stick     No current facility-administered medications for this visit.     REVIEW OF SYSTEMS:  Constitutional: Denies fevers, chills or abnormal night sweats (+) fatigue (+) trouble sleeping  Eyes: Denies blurriness of vision, double vision or watery eyes Ears, nose, mouth, throat, and face: Denies mucositis or sore throat Respiratory: Denies cough, dyspnea or wheezes Cardiovascular: Denies palpitation, chest discomfort or lower extremity swelling (+) occasional abdominal cramping depending on his diet Gastrointestinal:  Denies nausea, heartburn or change in bowel habits  Skin: Denies abnormal skin rashes Lymphatics: Denies new lymphadenopathy or easy bruising Neurological:Denies numbness, tingling or new weaknesses Behavioral/Psych: Mood is stable, no new changes  All other systems were reviewed with the patient and are negative.  PHYSICAL EXAMINATION:  ECOG PERFORMANCE STATUS: 1  Vitals:   03/08/18 1336  BP: (!) 153/87  Pulse: 60  Resp: 17  Temp: 98.3 F (36.8 C)  SpO2: 99%   Filed Weights   03/08/18 1336  Weight: 149 lb 1.6 oz (67.6 kg)    GENERAL:alert, no distress and comfortable SKIN: skin color, texture, turgor are normal, no rashes or significant lesions EYES: normal, conjunctiva are pink and non-injected, sclera clear OROPHARYNX:no  exudate, no erythema and lips, buccal mucosa, and tongue normal  NECK: supple, thyroid normal size, non-tender, without nodularity LYMPH:  no palpable lymphadenopathy in the cervical, axillary or inguinal LUNGS: clear to auscultation and percussion with normal breathing effort HEART: regular rate & rhythm and no murmurs and no lower extremity edema  ABDOMEN:abdomen soft, non-tender and normal bowel sounds, no organomegaly  Musculoskeletal:no cyanosis of digits and no clubbing  PSYCH: alert & oriented x 3 with  fluent speech NEURO: no focal motor/sensory deficits  LABORATORY DATA:  I have reviewed the data as listed CBC Latest Ref Rng & Units 03/08/2018 12/07/2017 09/08/2017  WBC 4.0 - 10.3 K/uL 6.4 6.0 5.3  Hemoglobin 13.0 - 17.1 g/dL 12.9(L) 13.1 13.0  Hematocrit 38.4 - 49.9 % 38.7 40.1 39.0  Platelets 140 - 400 K/uL 193 240 221   CMP Latest Ref Rng & Units 03/08/2018 12/07/2017 09/08/2017  Glucose 70 - 140 mg/dL 75 90 94  BUN 7 - 26 mg/dL 20 13 19.9  Creatinine 0.70 - 1.30 mg/dL 0.89 0.85 0.8  Sodium 136 - 145 mmol/L 141 140 141  Potassium 3.5 - 5.1 mmol/L 4.2 3.9 3.8  Chloride 98 - 109 mmol/L 107 104 -  CO2 22 - 29 mmol/L 27 28 25   Calcium 8.4 - 10.4 mg/dL 8.8 9.0 8.8  Total Protein 6.4 - 8.3 g/dL 6.1(L) 6.3(L) 6.3(L)  Total Bilirubin 0.2 - 1.2 mg/dL 0.3 0.4 0.42  Alkaline Phos 40 - 150 U/L 86 95 84  AST 5 - 34 U/L 16 16 17   ALT 0 - 55 U/L 6 14 11     Chromogranin A (<=15ng/ml)  04/03/2015: 93 05/29/2015: 43 08/21/2015: 43 12/07/2015: 8 03/04/2016: 11 08/19/2016: 8 12/02/2016: 13 03/03/2017: 15 05/26/17: 16 09/08/17: 15 12/07/17: 33 03/08/18: PENDING   24 hour urine 5-HIAA (<6.0 mg/24h) 04/03/2015: 51.3 09/17/2016: 12.9 12/11/2015: 23 06/24/2016: 23.2 12/05/2016: 18.4 03/03/2017: 28.4 05/26/17: 17.7 09/15/17: 27.3 03/08/18: PENDING    PATHOLOGY REPORT: Diagnosis 02/18/2015 Liver, needle/core biopsy, left lobe - METASTATIC LOW GRADE NEUROENDOCRINE TUMOR (CARCINOID). - SEE  COMMENT.   RADIOGRAPHIC STUDIES: I have personally reviewed the radiological images as listed and agreed with the findings in the report.   CT CAP W Contrast 12/07/17 IMPRESSION: Chest Impression: 1. No evidence of thoracic metastasis. 2. Stable ascending thoracic aortic aneurysm at 4.4 cm. Recommend attention on oncology surveillance. Abdomen / Pelvis Impression: 1. Mild interval increase in size of multifocal hepatic metastasis. 2. Large central small bowel mesenteric mass is unchanged 3. No bowel obstruction. 4. No skeletal lesions  CT CAP W Contrast 05/31/17 IMPRESSION: 1. Overall the extent of metastatic disease to the liver and abdominal adenopathy is similar to previous exam from 12/06/2016. 2. 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 3. Stable small pulmonary nodules. 4.  Aortic Atherosclerosis (ICD10-I70.0). 5. Dilatation of the ascending thoracic aorta. Unchanged. Recommend annual imaging followup by CTA or MRA. This recommendation follows 2010 ACCF/AHA/AATS/ACR/ASA/SCA/SCAI/SIR/STS/SVM Guidelines for the Diagnosis and Management of Patients with Thoracic Aortic Disease. Circulation. 2010; 121: e266-e369 6. Trace ascites.  Ct chest, abdomen Pelvis W Contrast 08/19/2016 IMPRESSION: 1. Overall, interval progression of multifocal liver metastases. 2. No wall mass within the central mesenteric, which obstructs the superior mesenteric vein, is not significantly changed when compared with previous exam. Associated mesenteric edema and small bowel wall thickening noted in the right lower quadrant. 3. Small nonspecific nodules are unchanged when compared with previous exam. 4. Ascending thoracic aortic aneurysm measuring 4 cm. Recommend annual imaging followup by CTA or MRA. This recommendation follows 2010 ACCF/AHA/AATS/ACR/ASA/SCA/SCAI/SIR/STS/SVM Guidelines for the Diagnosis and Management of Patients  with Thoracic Aortic Disease. Circulation. 2010; 121: N629-B284  Ct chest, abdomen Pelvis W Contrast 12/06/16 IMPRESSION: 1. Overall, the extent of metastatic disease to the liver and abdominal/retroperitoneal lymphatics is similar to the prior examination 08/19/2016. 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 2. Multiple small pulmonary nodules appear unchanged in size, number and distribution, favored to be benign. 3. Aortic atherosclerosis, in addition to 2 vessel coronary artery disease. Assessment for potential risk factor modification, dietary therapy or pharmacologic therapy may be warranted, if clinically indicated. 4. In addition, there is dilatation of the aortic root (4.7 cm in diameter at the level of the sinuses of Valsalva) and ectasia of the ascending thoracic aorta measuring 4.4 cm in diameter. Recommend annual imaging followup by CTA or MRA. This recommendation follows 2010 ACCF/AHA/AATS/ACR/ASA/SCA/SCAI/SIR/STS/SVM Guidelines for the Diagnosis and Management of Patients with Thoracic Aortic Disease. Circulation. 2010; 121: J194-R740. 5. Trace volume of ascites. 6. Additional incidental findings, as above.   ASSESSMENT & PLAN: 72 y.o.  Caucasian male without significant past medical history  1. 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 The liver biopsy surgical past findings were reviewed with them in great details. -The natural history of carcinoid tumor was reviewed with them. It is an indolent tumor, unfortunately has metastasized to liver, it is incurable at this stage. -The goal of therapy is tumor control, and prolong his life. -He has very bulky abdominal adenopathy, potentially can cause mall bowel obstruction and SMV occlusion. This was discussed with the  patient and his family -I previously discussed his restaging scan from 12/06/2016, which showed stable disease. No other new metastasis. -His initial serum chromogranin was previously elevated at 93, has come down significantly, corresponding to good response. The level has slightly increased lately, will continue monitoring.  -We also previously discussed about the new treatment option Lutathera therapy for metastatic neuroendocrine tumors, which has been approved by FDA in Jan 2018. He would be a good candidate if he progresses on Sandostatin injection -We discussed his 06/01/17 CT CAP that showed stable disease.  -We reviewed his restaging 12/07/17 CT CAP images, and compared to previous CT scan, it has showed mild disease progression in liver mets and enlarged lymph nodes next to liver and mesentery. There is no evidence of metastasis in chest or bones. -If he has further disease progression on his next scan, I will recommend change treatment to lutathera  -When he decides to change treatment, he will need a DOLATATE-PET Scan prior to make sure this treatment will benefit him.  -he is clinically doing well, no concerns for disease progression  -Labs reviewed, Hg at 12.9, total protein at 6.1 otherwise within normal limits. His Chromogranin A and 24hr urine is still pending.  -I recommend adding protein powder in his diet.  -Repeat CT AP scan in 3 months. If his scan shows disease progression I will refer him to Dr. Leonia Reeves to consult about Lutathera treatment. It is not urgent for him to have a decision for further treatment as he is clinically doing well currently.  -Continue on monthly Sandostatin injections for now.    -F/u in 3 months with lab and CT scan of abdomen and pelvis w contrast a few days before. His CT chest has been negative, will not do it on next scan.    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  -He is not on oral iron supplement. -His iron study on (05/26/17) was normal -Anemia very mild, Hg  at 12.9 today (03/08/18)  3. Diarrhea  -secondary to #1 -I previously encouraged him to use Imodium as needed.  -His diarrhea has improved and almost controlled, he goes 2-3 a day.    4. Possible right inguinal hernia - Patient complains of having a "knot" in the groin area. - I have previously dicussed with the patient and we have agreed to just continue watching the area as long as no pain is present.   5. Financial Assistance  -He is paying $1100+ for injections because his deductible is $5000.  -He is currently working with [facility] to see their oncology in [place] about doing injections with them.  -I recommend in the meantime he sees our financial staff about any grants or resources available to him. He will see them previously.  6. Insomnia -he sleeps 1-2 hours at a time. This is unrelated to his cancer or treatment -I reviewed sleep hygiene with him and encouraged him to be more active in the afternoon with little to no naps.  -He has tried melatonin with some relief but with fatigue in the morning. I recommend he try low dose for 1 week to see if this adjusts his sleep cycle.    Plan  -Labs reviewed and adequate to proceed with Sandostatin injection today  -F/u in 3 months with lab and CT ABD/PEL with contrast a few days before  -Injection every month X3   All questions were answered. The patient knows to call the clinic with any problems, questions or concerns.  I spent 20 minutes counseling the patient face to face. The total time spent in the appointment was 25 minutes and more than 50% was on counseling.  Oneal Deputy, am acting as scribe for [name], MD.   I have reviewed the above documentation for accuracy and completeness, and I agree with the above.     [name], MD 03/08/2018

## 2018-03-08 NOTE — Telephone Encounter (Signed)
Scheduled appt per 6/6 los - gave patient AVS and calender per los.  

## 2018-03-08 NOTE — Patient Instructions (Signed)

## 2018-03-12 LAB — CHROMOGRANIN A: CHROMOGRANIN A: 22 nmol/L — AB (ref 0–5)

## 2018-03-13 ENCOUNTER — Telehealth: Payer: Self-pay

## 2018-03-13 NOTE — Telephone Encounter (Signed)
Per Dr. Burr Medico notified patient tumor marker continues to improve which is good news. Left voice message and encouraged patient to call back if he has questions.

## 2018-03-13 NOTE — Telephone Encounter (Signed)
-----   Message from [name], MD sent at 03/12/2018 11:44 PM EDT ----- Please let pt know his tumor marker results, it continues to improve, which is a good knows.  [name]  03/12/2018

## 2018-04-12 ENCOUNTER — Inpatient Hospital Stay: Payer: Medicare Other | Attending: Hematology

## 2018-04-12 VITALS — BP 153/94 | HR 54 | Temp 98.5°F | Resp 18

## 2018-04-12 DIAGNOSIS — R197 Diarrhea, unspecified: Secondary | ICD-10-CM | POA: Diagnosis not present

## 2018-04-12 DIAGNOSIS — C7B02 Secondary carcinoid tumors of liver: Secondary | ICD-10-CM | POA: Diagnosis not present

## 2018-04-12 DIAGNOSIS — D500 Iron deficiency anemia secondary to blood loss (chronic): Secondary | ICD-10-CM

## 2018-04-12 DIAGNOSIS — C7B09 Secondary carcinoid tumors of other sites: Secondary | ICD-10-CM | POA: Insufficient documentation

## 2018-04-12 DIAGNOSIS — C7A029 Malignant carcinoid tumor of the large intestine, unspecified portion: Secondary | ICD-10-CM | POA: Diagnosis not present

## 2018-04-12 MED ORDER — OCTREOTIDE ACETATE 30 MG IM KIT
PACK | INTRAMUSCULAR | Status: AC
Start: 2018-04-12 — End: ?
  Filled 2018-04-12: qty 1

## 2018-04-12 MED ORDER — OCTREOTIDE ACETATE 30 MG IM KIT
30.0000 mg | PACK | Freq: Once | INTRAMUSCULAR | Status: AC
  Administered 2018-04-12: 30 mg via INTRAMUSCULAR

## 2018-04-12 NOTE — Patient Instructions (Signed)

## 2018-05-10 ENCOUNTER — Inpatient Hospital Stay: Payer: Medicare Other | Attending: Hematology

## 2018-05-10 VITALS — BP 138/83 | HR 60 | Temp 98.5°F | Resp 18

## 2018-05-10 DIAGNOSIS — C7B09 Secondary carcinoid tumors of other sites: Secondary | ICD-10-CM | POA: Diagnosis not present

## 2018-05-10 DIAGNOSIS — C7B02 Secondary carcinoid tumors of liver: Secondary | ICD-10-CM | POA: Insufficient documentation

## 2018-05-10 DIAGNOSIS — C7A029 Malignant carcinoid tumor of the large intestine, unspecified portion: Secondary | ICD-10-CM | POA: Diagnosis not present

## 2018-05-10 DIAGNOSIS — R197 Diarrhea, unspecified: Secondary | ICD-10-CM | POA: Insufficient documentation

## 2018-05-10 DIAGNOSIS — D500 Iron deficiency anemia secondary to blood loss (chronic): Secondary | ICD-10-CM

## 2018-05-10 MED ORDER — OCTREOTIDE ACETATE 30 MG IM KIT
30.0000 mg | PACK | Freq: Once | INTRAMUSCULAR | Status: AC
  Administered 2018-05-10: 30 mg via INTRAMUSCULAR

## 2018-05-10 MED ORDER — OCTREOTIDE ACETATE 30 MG IM KIT
PACK | INTRAMUSCULAR | Status: AC
  Filled 2018-05-10: qty 1

## 2018-05-10 NOTE — Patient Instructions (Signed)

## 2018-05-25 DIAGNOSIS — C7A029 Malignant carcinoid tumor of the large intestine, unspecified portion: Secondary | ICD-10-CM | POA: Diagnosis not present

## 2018-05-30 LAB — 5 HIAA, QUANTITATIVE, URINE, 24 HOUR
5-HIAA, Ur: 16.5 mg/L
5-HIAA,QUANT.,24 HR URINE: 38.8 mg/(24.h) — AB (ref 0.0–14.9)
TOTAL VOLUME: 2350

## 2018-06-12 ENCOUNTER — Inpatient Hospital Stay: Payer: Medicare Other | Attending: Hematology

## 2018-06-12 ENCOUNTER — Ambulatory Visit (HOSPITAL_COMMUNITY)
Admission: RE | Admit: 2018-06-12 | Discharge: 2018-06-12 | Disposition: A | Discharge status: Home / Self Care | Payer: Medicare Other | Source: Ambulatory Visit | Attending: Hematology | Admitting: Hematology

## 2018-06-12 DIAGNOSIS — C7A029 Malignant carcinoid tumor of the large intestine, unspecified portion: Secondary | ICD-10-CM | POA: Insufficient documentation

## 2018-06-12 DIAGNOSIS — C7B8 Other secondary neuroendocrine tumors: Secondary | ICD-10-CM | POA: Diagnosis not present

## 2018-06-12 DIAGNOSIS — Z87891 Personal history of nicotine dependence: Secondary | ICD-10-CM | POA: Insufficient documentation

## 2018-06-12 DIAGNOSIS — R197 Diarrhea, unspecified: Secondary | ICD-10-CM | POA: Diagnosis not present

## 2018-06-12 DIAGNOSIS — D509 Iron deficiency anemia, unspecified: Secondary | ICD-10-CM | POA: Insufficient documentation

## 2018-06-12 DIAGNOSIS — R590 Localized enlarged lymph nodes: Secondary | ICD-10-CM | POA: Insufficient documentation

## 2018-06-12 DIAGNOSIS — C787 Secondary malignant neoplasm of liver and intrahepatic bile duct: Secondary | ICD-10-CM | POA: Insufficient documentation

## 2018-06-12 DIAGNOSIS — R188 Other ascites: Secondary | ICD-10-CM | POA: Diagnosis not present

## 2018-06-12 DIAGNOSIS — I700 Atherosclerosis of aorta: Secondary | ICD-10-CM | POA: Insufficient documentation

## 2018-06-12 LAB — CBC WITH DIFFERENTIAL/PLATELET
BASOS ABS: 0.1 10*3/uL (ref 0.0–0.1)
Basophils Relative: 1 %
Eosinophils Absolute: 0.1 10*3/uL (ref 0.0–0.5)
Eosinophils Relative: 2 %
HCT: 37.2 % — ABNORMAL LOW (ref 38.4–49.9)
Hemoglobin: 12.5 g/dL — ABNORMAL LOW (ref 13.0–17.1)
LYMPHS PCT: 15 %
Lymphs Abs: 0.8 10*3/uL — ABNORMAL LOW (ref 0.9–3.3)
MCH: 31.2 pg (ref 27.2–33.4)
MCHC: 33.5 g/dL (ref 32.0–36.0)
MCV: 93.2 fL (ref 79.3–98.0)
Monocytes Absolute: 0.6 10*3/uL (ref 0.1–0.9)
Monocytes Relative: 11 %
Neutro Abs: 3.9 10*3/uL (ref 1.5–6.5)
Neutrophils Relative %: 71 %
Platelets: 184 10*3/uL (ref 140–400)
RBC: 3.99 MIL/uL — AB (ref 4.20–5.82)
RDW: 13.2 % (ref 11.0–14.6)
WBC: 5.5 10*3/uL (ref 4.0–10.3)

## 2018-06-12 LAB — COMPREHENSIVE METABOLIC PANEL
ALBUMIN: 3.4 g/dL — AB (ref 3.5–5.0)
ALK PHOS: 93 U/L (ref 38–126)
ALT: 8 U/L (ref 0–44)
AST: 16 U/L (ref 15–41)
Anion gap: 9 (ref 5–15)
BUN: 21 mg/dL (ref 8–23)
CO2: 27 mmol/L (ref 22–32)
Calcium: 9.2 mg/dL (ref 8.9–10.3)
Chloride: 105 mmol/L (ref 98–111)
Creatinine, Ser: 0.85 mg/dL (ref 0.61–1.24)
GFR calc Af Amer: >60 mL/min (ref >60)
GFR calc non Af Amer: >60 mL/min (ref >60)
Glucose, Bld: 81 mg/dL (ref 70–99)
POTASSIUM: 4.1 mmol/L (ref 3.5–5.1)
Sodium: 141 mmol/L (ref 135–145)
TOTAL PROTEIN: 6.2 g/dL — AB (ref 6.5–8.1)
Total Bilirubin: 0.5 mg/dL (ref 0.3–1.2)

## 2018-06-12 MED ORDER — IOHEXOL 300 MG/ML  SOLN
100.0000 mL | Freq: Once | INTRAMUSCULAR | Status: AC | PRN
  Administered 2018-06-12: 100 mL via INTRAVENOUS

## 2018-06-12 NOTE — Progress Notes (Signed)
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 Extensive confluent ileal colic adenopathy extending chronically along the superior mesenteric vein, multiple hepatic metastasis    02/18/2015 Initial Diagnosis    Carcinoid tumor of colon, malignant, with liver metastasis    02/18/2015 Initial Biopsy    Liver biopsy showed metastatic low-grade neuroendocrine tumor (carcinoid). The tumor is positive for CD X2, CD 56, chromogranin, and synaptophysin.    03/05/2015 -  Chemotherapy    Sandostatin 30 mg IM every 4 weeks    03/06/2015 Imaging    CT chest showed no evidence of metastasis in the chest. 4.4 cm ascending aortic aneurysm.    09/09/2015 Imaging    Restaging CT abdomen and pelvis with contrast showed interval response of hepatic metastasis. Most are small and, some are stable. Slight interval decrease in mesenteric adenopathy and primary colon tumor.     12/06/2016 Imaging    CT CAP w contrast IMPRESSION: 1. Overall, the extent of metastatic disease to the liver and abdominal/retroperitoneal lymphatics is similar to the prior examination 08/19/2016. 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 2. Multiple small pulmonary nodules appear unchanged in size, number and distribution, favored to be benign. 3.  Aortic atherosclerosis, in addition to 2 vessel coronary artery disease. Assessment for potential risk factor modification, dietary therapy or pharmacologic therapy may be warranted, if clinically indicated. 4. In addition, there is dilatation of the aortic root (4.7 cm in diameter at the level of the sinuses of Valsalva) and ectasia of the ascending thoracic aorta measuring 4.4 cm in diameter. Recommend annual imaging followup by CTA or MRA. This recommendation follows 2010 ACCF/AHA/AATS/ACR/ASA/SCA/SCAI/SIR/STS/SVM Guidelines for the Diagnosis and Management of Patients with Thoracic Aortic Disease. Circulation. 2010; 121: D664-Q034. 5. Trace volume of ascites. 6. Additional incidental findings, as above.     05/31/2017 Imaging    CT CAP W Contrast 05/31/17 IMPRESSION: 1. Overall the extent of metastatic disease to the liver and abdominal adenopathy is similar to previous exam from 12/06/2016. 2. 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 3. Stable small pulmonary nodules. 4.  Aortic Atherosclerosis (ICD10-I70.0). 5. Dilatation of the ascending thoracic aorta. Unchanged. Recommend annual imaging followup by CTA or MRA. This recommendation follows 2010 ACCF/AHA/AATS/ACR/ASA/SCA/SCAI/SIR/STS/SVM Guidelines for the Diagnosis and Management of Patients with Thoracic Aortic Disease. Circulation. 2010; 121: e266-e369 6. Trace ascites.    12/07/2017 Imaging    CT CAP W Contrast 12/07/17 IMPRESSION: Chest Impression: 1. No evidence of thoracic metastasis. 2. Stable ascending thoracic aortic aneurysm at 4.4 cm. Recommend attention on oncology surveillance. Abdomen / Pelvis Impression: 1. Mild interval increase in size of multifocal hepatic metastasis. 2. Large central small bowel mesenteric mass is unchanged 3. No bowel obstruction. 4. No skeletal  lesions     06/12/2018 Imaging    06/12/2018 CT AP IMPRESSION: Large  mesenteric mass in the right mid abdomen, increased.  Multifocal hepatic metastases, increased.  Mild upper abdominal lymphadenopathy, mildly increased.  Masslike thickening involving the terminal ileum, suspicious for metastasis, similar to the prior. Additional nonspecific wall thickening involving multiple loops of small bowel in the pelvis.  Small volume pelvic ascites, mildly increased.    06/12/2018 Progression    IMPRESSION: Large mesenteric mass in the right mid abdomen, increased.  Multifocal hepatic metastases, increased.  Mild upper abdominal lymphadenopathy, mildly increased.  Masslike thickening involving the terminal ileum, suspicious for metastasis, similar to the prior. Additional nonspecific wall thickening involving multiple loops of small bowel in the pelvis.  Small volume pelvic ascites, mildly increased.     HISTORY OF PRESENTING ILLNESS:  Ryan Cowan 72 y.o. male is here because of recently diagnosed metastatic carcinoid tumor. I saw him in the hospital initially, this is his first follow-up after his hospital discharge. He presents to the clinic with his 3 sisters.  He has been having diarrhea, fatigue and dyspnea on exertion for the past 2-3 months. His diarrhea is watery stool, no hematochezia or melena. He denies significant abdominal pain, nausea, or change of his appetite. He lost about 20 pounds in the past 3 months. No fever, chills or night sweats. He has mild chronic dry cough which has not changed lately. He presented to his primary care physician 2-3 weeks ago, had lab work done, and was found to have hemoglobin 6.7. He was sent to emergency room on 02/16/2015 and was admitted. CT of abdomen and pelvis showed a cecal mass, adenopathy, and multiple liver lesions, which is highly suspicious for metastatic colon cancer. He received 2 units of blood transfusion, IV dextran 250 mg, and underwent liver biopsy. He was discharged home after the  biopsy.  He is single, no children. He was independent, but not very physically active. He lives in a trailer. He has 3 sisters and parents live in [place] area. No family history of colon cancer or other malignancy. He moved to his mother's house, and is now very close to his sisters. His mom lives in a nursing home.  He feels better lately, gained some weight. His diarrhea is unchanged. He has mild intermittent low abdominal pain after bowel movement. No other new complaints.  CURRENT THERAPY: Sandostatin 30 mg injection every 4 weeks  Interim history:  Ryan Cowan returns for follow up. He is here with his wife. He is doing well and denies new complaints. He says that his stomach was groaning and he had diarrhea after the contrast. He says that he still gets diarrhea. He states that he noticed an anterior abdominal wall gulf ball sized bulge.   He states that he is worried about his disease progression and is stressed by the finances.     MEDICAL HISTORY:  Past Medical History:  Diagnosis Date  . Liver metastases ([place])     SURGICAL HISTORY: Past Surgical History:  Procedure Laterality Date  . APPENDECTOMY    . KNEE SURGERY    . SHOULDER SURGERY      SOCIAL HISTORY: Social History   Socioeconomic History  . Marital status: Single    Spouse name: Not on file  . Number of children: Not on file  . Years of education: Not on file  . Highest education level: Not on file  Occupational History  . Not on file  Social  Needs  . Financial resource strain: Not on file  . Food insecurity:    Worry: Not on file    Inability: Not on file  . Transportation needs:    Medical: Not on file    Non-medical: Not on file  Tobacco Use  . Smoking status: Former Smoker    Packs/day: 2.50    Years: 50.00    Pack years: 125.00    Last attempt to quit: 10/04/2011    Years since quitting: 6.6  . Smokeless tobacco: Never Used  Substance and Sexual Activity  . Alcohol use: No  . Drug use:  Not on file  . Sexual activity: Not on file  Lifestyle  . Physical activity:    Days per week: Not on file    Minutes per session: Not on file  . Stress: Not on file  Relationships  . Social connections:    Talks on phone: Not on file    Gets together: Not on file    Attends religious service: Not on file    Active member of club or organization: Not on file    Attends meetings of clubs or organizations: Not on file    Relationship status: Not on file  . Intimate partner violence:    Fear of current or ex partner: Not on file    Emotionally abused: Not on file    Physically abused: Not on file    Forced sexual activity: Not on file  Other Topics Concern  . Not on file  Social History Narrative   Lives alone in his mother's home (in SNF)   Has #3 sisters, [name] is POA   Prior employed in [employer] and was in [employer] 4 years   Very detail oriented in conversation    FAMILY HISTORY: Family History  Problem Relation Age of Onset  . CAD Mother   . CAD Father   . Hypertension Father   . Hypertension Sister     ALLERGIES:  is allergic to codeine.  MEDICATIONS:  Current Outpatient Medications  Medication Sig Dispense Refill  . Multiple Vitamin (MULTIVITAMIN WITH MINERALS) TABS tablet Take 1 tablet by mouth daily. Centrum Silver 50 plus    . naproxen sodium (ANAPROX) 220 MG tablet Take 220 mg by mouth 2 (two) times daily as needed (pain).    Marland Kitchen OVER THE COUNTER MEDICATION Place 1 application into both nostrils daily as needed (congestion). Vicks inhaler stick     No current facility-administered medications for this visit.     REVIEW OF SYSTEMS:  Constitutional: Denies fevers, chills or abnormal night sweats (+) fatigue  Eyes: Denies blurriness of vision, double vision or watery eyes Ears, nose, mouth, throat, and face: Denies mucositis or sore throat Respiratory: Denies cough, dyspnea or wheezes Cardiovascular: Denies palpitation, chest discomfort or  lower extremity swelling (+) occasional abdominal cramping and diarrhea after contrast  Gastrointestinal:  Denies nausea, heartburn or (+) abdominal cramps and diarrhea after contrast (+) anterior abdominal wall gulf ball size bulging   Skin: Denies abnormal skin rashes Lymphatics: Denies new lymphadenopathy or easy bruising Neurological:Denies numbness, tingling or new weaknesses Behavioral/Psych: Mood is stable, no new changes  All other systems were reviewed with the patient and are negative.  PHYSICAL EXAMINATION:  ECOG PERFORMANCE STATUS: 1  Vitals:   06/14/18 1328  BP: (!) 150/87  Pulse: 62  Resp: 19  Temp: 98.1 F (36.7 C)  SpO2: 97%   Filed Weights   06/14/18 1328  Weight: 146  lb 3.2 oz (66.3 kg)    GENERAL:alert, no distress and comfortable SKIN: skin color, texture, turgor are normal, no rashes or significant lesions EYES: normal, conjunctiva are pink and non-injected, sclera clear OROPHARYNX:no exudate, no erythema and lips, buccal mucosa, and tongue normal  NECK: supple, thyroid normal size, non-tender, without nodularity LYMPH:  no palpable lymphadenopathy in the cervical, axillary or inguinal LUNGS: clear to auscultation and percussion with normal breathing effort HEART: regular rate & rhythm and no murmurs and no lower extremity edema  ABDOMEN:abdomen soft, non-tender and normal bowel sounds, no organomegaly  Musculoskeletal:no cyanosis of digits and no clubbing  PSYCH: alert & oriented x 3 with fluent speech (+) anxious about disease progression NEURO: no focal motor/sensory deficits  LABORATORY DATA:  I have reviewed the data as listed CBC Latest Ref Rng & Units 06/12/2018 03/08/2018 12/07/2017  WBC 4.0 - 10.3 K/uL 5.5 6.4 6.0  Hemoglobin 13.0 - 17.1 g/dL 12.5(L) 12.9(L) 13.1  Hematocrit 38.4 - 49.9 % 37.2(L) 38.7 40.1  Platelets 140 - 400 K/uL 184 193 240   CMP Latest Ref Rng & Units 06/12/2018 03/08/2018 12/07/2017  Glucose 70 - 99 mg/dL 81 75 90  BUN 8 - 23  mg/dL 21 20 13   Creatinine 0.61 - 1.24 mg/dL 0.85 0.89 0.85  Sodium 135 - 145 mmol/L 141 141 140  Potassium 3.5 - 5.1 mmol/L 4.1 4.2 3.9  Chloride 98 - 111 mmol/L 105 107 104  CO2 22 - 32 mmol/L 27 27 28   Calcium 8.9 - 10.3 mg/dL 9.2 8.8 9.0  Total Protein 6.5 - 8.1 g/dL 6.2(L) 6.1(L) 6.3(L)  Total Bilirubin 0.3 - 1.2 mg/dL 0.5 0.3 0.4  Alkaline Phos 38 - 126 U/L 93 86 95  AST 15 - 41 U/L 16 16 16   ALT 0 - 44 U/L 8 6 14     Chromogranin A (<=15ng/ml)  04/03/2015: 93 05/29/2015: 43 08/21/2015: 43 12/07/2015: 8 03/04/2016: 11 08/19/2016: 8 12/02/2016: 13 03/03/2017: 15 05/26/17: 16 09/08/17: 15 12/07/17: 33 03/08/18: 22 06/12/18: PENDING  24 hour urine 5-HIAA (<6.0 mg/24h) 04/03/2015: 51.3 09/17/2016: 12.9 12/11/2015: 23 06/24/2016: 23.2 12/05/2016: 18.4 03/03/2017: 28.4 05/26/17: 17.7 09/15/17: 27.3 05/25/18: 38.8    PATHOLOGY REPORT: Diagnosis 02/18/2015 Liver, needle/core biopsy, left lobe - METASTATIC LOW GRADE NEUROENDOCRINE TUMOR (CARCINOID). - SEE COMMENT.   RADIOGRAPHIC STUDIES: I have personally reviewed the radiological images as listed and agreed with the findings in the report.  06/12/2018 CT AP IMPRESSION: Large mesenteric mass in the right mid abdomen, increased.  Multifocal hepatic metastases, increased.  Mild upper abdominal lymphadenopathy, mildly increased.  Masslike thickening involving the terminal ileum, suspicious for metastasis, similar to the prior. Additional nonspecific wall thickening involving multiple loops of small bowel in the pelvis.  Small volume pelvic ascites, mildly increased.  CT CAP W Contrast 12/07/17 IMPRESSION: Chest Impression: 1. No evidence of thoracic metastasis. 2. Stable ascending thoracic aortic aneurysm at 4.4 cm. Recommend attention on oncology surveillance. Abdomen / Pelvis Impression: 1. Mild interval increase in size of multifocal hepatic metastasis. 2. Large central small bowel mesenteric mass is unchanged 3. No bowel  obstruction. 4. No skeletal lesions  CT CAP W Contrast 05/31/17 IMPRESSION: 1. Overall the extent of metastatic disease to the liver and abdominal adenopathy is similar to previous exam from 12/06/2016. 2. 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 3. Stable small pulmonary nodules. 4.  Aortic Atherosclerosis (ICD10-I70.0). 5. Dilatation of the ascending thoracic aorta. Unchanged. Recommend annual  imaging followup by CTA or MRA. This recommendation follows 2010 ACCF/AHA/AATS/ACR/ASA/SCA/SCAI/SIR/STS/SVM Guidelines for the Diagnosis and Management of Patients with Thoracic Aortic Disease. Circulation. 2010; 121: e266-e369 6. Trace ascites.  Ct chest, abdomen Pelvis W Contrast 08/19/2016 IMPRESSION: 1. Overall, interval progression of multifocal liver metastases. 2. No wall mass within the central mesenteric, which obstructs the superior mesenteric vein, is not significantly changed when compared with previous exam. Associated mesenteric edema and small bowel wall thickening noted in the right lower quadrant. 3. Small nonspecific nodules are unchanged when compared with previous exam. 4. Ascending thoracic aortic aneurysm measuring 4 cm. Recommend annual imaging followup by CTA or MRA. This recommendation follows 2010 ACCF/AHA/AATS/ACR/ASA/SCA/SCAI/SIR/STS/SVM Guidelines for the Diagnosis and Management of Patients with Thoracic Aortic Disease. Circulation. 2010; 121: P710-G269  Ct chest, abdomen Pelvis W Contrast 12/06/16 IMPRESSION: 1. Overall, the extent of metastatic disease to the liver and abdominal/retroperitoneal lymphatics is similar to the prior examination 08/19/2016. 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 2. Multiple small pulmonary nodules appear unchanged in size, number and distribution, favored to be benign. 3. Aortic  atherosclerosis, in addition to 2 vessel coronary artery disease. Assessment for potential risk factor modification, dietary therapy or pharmacologic therapy may be warranted, if clinically indicated. 4. In addition, there is dilatation of the aortic root (4.7 cm in diameter at the level of the sinuses of Valsalva) and ectasia of the ascending thoracic aorta measuring 4.4 cm in diameter. Recommend annual imaging followup by CTA or MRA. This recommendation follows 2010 ACCF/AHA/AATS/ACR/ASA/SCA/SCAI/SIR/STS/SVM Guidelines for the Diagnosis and Management of Patients with Thoracic Aortic Disease. Circulation. 2010; 121: S854-O270. 5. Trace volume of ascites. 6. Additional incidental findings, as above.   ASSESSMENT & PLAN:   72 y.o.  Caucasian male without significant past medical history  1. 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 The liver biopsy surgical past findings were reviewed with them in great details. -The natural history of carcinoid tumor was reviewed with them. It is an indolent tumor, unfortunately has metastasized to liver, it is incurable at this stage. -The goal of therapy is tumor control, and prolong his life. -He has very bulky abdominal adenopathy, potentially can cause mall bowel obstruction and SMV occlusion. This was discussed with the patient and his family -I previously discussed his restaging scan from 12/06/2016, which showed stable disease. No other new metastasis. -His initial serum chromogranin was previously elevated at 93, has come down significantly, corresponding to good response. The level has slightly increased lately, will continue monitoring.  -We also previously discussed about the new treatment option Lutathera therapy for metastatic neuroendocrine tumors, which has been approved by FDA in Jan 2018. He would be a good  candidate if he progresses on Sandostatin injection -We discussed his 06/01/17 CT CAP that showed stable disease.  -We reviewed his restaging 12/07/17 CT CAP images, and compared to previous CT scan, it has showed mild disease progression in liver mets and enlarged lymph nodes next to liver and mesentery. There is no evidence of metastasis in chest or bones. -CT scan on 06/12/2018 reviewed with patient. It showed disease progression, some of his liver mets and PR nodes has significantly increased in size.  I have personally reviewed the scan images, agree with the radiology interpretation, and reviewed with patient and his sister today. -I recommend  a DOLATATE-PET Scan in the next few weeks, to see if he is a candidate for Lutathera therapy  -Labs reviewed, Hg at 12.5, total protein at 6.2 otherwise within normal limits. His Chromogranin A is pending.  -I will refer him to Dr. Leonia Reeves to discuss Lutathera therapy after the PET scan  -Continue on monthly Sandostatin injections for now.  -F/u in 4 weeks. -PET scan in 2-3 weeks   2. 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  -He is not on oral iron supplement. -His iron study on (05/26/17) was normal  -Anemia very mild, Hg at 12.5 today   3. Diarrhea  -secondary to #1 -I previously encouraged him to use Imodium as needed.  -His diarrhea has improved and almost controlled, he goes 2-3 a day.  -He had diarrhea and abdominal cramps after drinking contrast for his 06/12/2018 scan. He is recovering well   4. Possible right inguinal hernia - Patient complains of having a "knot" in the groin area. - I have previously dicussed with the patient and we have agreed to just continue watching the area as long as no pain is present.   5. Financial Assistance  -He was paying $1100+ for injections because  his deductible is $5000. He is not paying out of pocket lately     Plan  -Continue monthly Sandostatin injections for now.  -F/u in 4 weeks. -DOTATATE-PET scan in 2 weeks -schedule appointment with Dr. Leonia Reeves in 2-3 weeks after the PET scan, I sent a message to him today    All questions were answered. The patient knows to call the clinic with any problems, questions or concerns.  I spent 20 minutes counseling the patient face to face. The total time spent in the appointment was 25 minutes and more than 50% was on counseling.  Dierdre Searles Dweik am acting as scribe for Dr. [name].  I have reviewed the above documentation for accuracy and completeness, and I agree with the above.     [name], MD 06/14/2018

## 2018-06-14 ENCOUNTER — Telehealth: Payer: Self-pay | Admitting: Hematology

## 2018-06-14 ENCOUNTER — Inpatient Hospital Stay: Payer: Medicare Other | Admitting: Hematology

## 2018-06-14 ENCOUNTER — Encounter: Payer: Self-pay | Admitting: Hematology

## 2018-06-14 ENCOUNTER — Inpatient Hospital Stay: Payer: Medicare Other

## 2018-06-14 VITALS — BP 150/87 | HR 62 | Temp 98.1°F | Resp 19 | Ht 74.0 in | Wt 146.2 lb

## 2018-06-14 DIAGNOSIS — Z87891 Personal history of nicotine dependence: Secondary | ICD-10-CM

## 2018-06-14 DIAGNOSIS — I700 Atherosclerosis of aorta: Secondary | ICD-10-CM

## 2018-06-14 DIAGNOSIS — R197 Diarrhea, unspecified: Secondary | ICD-10-CM

## 2018-06-14 DIAGNOSIS — C7A029 Malignant carcinoid tumor of the large intestine, unspecified portion: Secondary | ICD-10-CM | POA: Diagnosis not present

## 2018-06-14 DIAGNOSIS — D509 Iron deficiency anemia, unspecified: Secondary | ICD-10-CM | POA: Diagnosis not present

## 2018-06-14 DIAGNOSIS — C7B02 Secondary carcinoid tumors of liver: Secondary | ICD-10-CM

## 2018-06-14 DIAGNOSIS — D500 Iron deficiency anemia secondary to blood loss (chronic): Secondary | ICD-10-CM

## 2018-06-14 LAB — CHROMOGRANIN A: CHROMOGRANIN A: 32 nmol/L — AB (ref 0–5)

## 2018-06-14 MED ORDER — OCTREOTIDE ACETATE 30 MG IM KIT
30.0000 mg | PACK | Freq: Once | INTRAMUSCULAR | Status: AC
  Administered 2018-06-14: 30 mg via INTRAMUSCULAR

## 2018-06-14 NOTE — Telephone Encounter (Signed)
Scheduled appt per 9/12 los - gave patient AVS and calender per los.   

## 2018-07-06 ENCOUNTER — Other Ambulatory Visit: Payer: Self-pay

## 2018-07-06 ENCOUNTER — Encounter (HOSPITAL_COMMUNITY): Payer: Self-pay

## 2018-07-06 ENCOUNTER — Ambulatory Visit (HOSPITAL_COMMUNITY)
Admission: RE | Admit: 2018-07-06 | Discharge: 2018-07-06 | Disposition: A | Discharge status: Home / Self Care | Payer: Medicare Other | Source: Ambulatory Visit | Attending: Hematology | Admitting: Hematology

## 2018-07-06 DIAGNOSIS — C7A029 Malignant carcinoid tumor of the large intestine, unspecified portion: Secondary | ICD-10-CM | POA: Insufficient documentation

## 2018-07-06 MED ORDER — GALLIUM GA 68 DOTATATE IV KIT: 3.8000 | PACK | Freq: Once | INTRAVENOUS | Status: DC

## 2018-07-09 ENCOUNTER — Telehealth: Payer: Self-pay | Admitting: Hematology

## 2018-07-09 ENCOUNTER — Other Ambulatory Visit: Payer: Self-pay | Admitting: Hematology

## 2018-07-09 DIAGNOSIS — C7A029 Malignant carcinoid tumor of the large intestine, unspecified portion: Secondary | ICD-10-CM

## 2018-07-09 NOTE — Telephone Encounter (Signed)
Tried to reach regarding 10/10 cancel

## 2018-07-11 ENCOUNTER — Other Ambulatory Visit: Payer: Self-pay | Admitting: Hematology

## 2018-07-11 MED ORDER — LORAZEPAM 1 MG PO TABS: 1.0000 mg | ORAL_TABLET | Freq: Once | ORAL | 0 refills | Status: AC

## 2018-07-12 ENCOUNTER — Ambulatory Visit: Payer: Medicare Other

## 2018-07-12 ENCOUNTER — Ambulatory Visit: Payer: Medicare Other | Admitting: Hematology

## 2018-07-18 ENCOUNTER — Ambulatory Visit (HOSPITAL_COMMUNITY)
Admission: RE | Admit: 2018-07-18 | Discharge: 2018-07-18 | Disposition: A | Discharge status: Home / Self Care | Payer: Medicare Other | Source: Ambulatory Visit | Attending: Hematology | Admitting: Hematology

## 2018-07-18 DIAGNOSIS — C771 Secondary and unspecified malignant neoplasm of intrathoracic lymph nodes: Secondary | ICD-10-CM | POA: Insufficient documentation

## 2018-07-18 DIAGNOSIS — C772 Secondary and unspecified malignant neoplasm of intra-abdominal lymph nodes: Secondary | ICD-10-CM | POA: Insufficient documentation

## 2018-07-18 DIAGNOSIS — C7A8 Other malignant neuroendocrine tumors: Secondary | ICD-10-CM | POA: Insufficient documentation

## 2018-07-18 DIAGNOSIS — N200 Calculus of kidney: Secondary | ICD-10-CM | POA: Diagnosis not present

## 2018-07-18 DIAGNOSIS — I700 Atherosclerosis of aorta: Secondary | ICD-10-CM | POA: Insufficient documentation

## 2018-07-18 DIAGNOSIS — C7951 Secondary malignant neoplasm of bone: Secondary | ICD-10-CM | POA: Insufficient documentation

## 2018-07-18 DIAGNOSIS — I2510 Atherosclerotic heart disease of native coronary artery without angina pectoris: Secondary | ICD-10-CM | POA: Diagnosis not present

## 2018-07-18 DIAGNOSIS — R222 Localized swelling, mass and lump, trunk: Secondary | ICD-10-CM | POA: Insufficient documentation

## 2018-07-18 DIAGNOSIS — C7A029 Malignant carcinoid tumor of the large intestine, unspecified portion: Secondary | ICD-10-CM | POA: Insufficient documentation

## 2018-07-18 DIAGNOSIS — C787 Secondary malignant neoplasm of liver and intrahepatic bile duct: Secondary | ICD-10-CM | POA: Insufficient documentation

## 2018-07-18 MED ORDER — FLUDEOXYGLUCOSE F - 18 (FDG) INJECTION: 3.8000 | Freq: Once | INTRAVENOUS | Status: DC | PRN

## 2018-07-18 MED ORDER — GALLIUM GA 68 DOTATATE IV KIT
3.8000 | PACK | Freq: Once | INTRAVENOUS | Status: AC
  Administered 2018-07-18: 3.8 via INTRAVENOUS

## 2018-07-21 ENCOUNTER — Other Ambulatory Visit: Payer: Self-pay | Admitting: Hematology

## 2018-07-23 ENCOUNTER — Telehealth: Payer: Self-pay | Admitting: Hematology

## 2018-07-23 NOTE — Telephone Encounter (Signed)
Spoke to pt regarding upcoming appts per 10/19 sch message

## 2018-07-24 ENCOUNTER — Encounter: Payer: Self-pay | Admitting: Nurse Practitioner

## 2018-07-24 ENCOUNTER — Telehealth: Payer: Self-pay | Admitting: Hematology

## 2018-07-24 ENCOUNTER — Inpatient Hospital Stay: Payer: Medicare Other

## 2018-07-24 ENCOUNTER — Inpatient Hospital Stay: Payer: Medicare Other | Attending: Hematology

## 2018-07-24 ENCOUNTER — Inpatient Hospital Stay (HOSPITAL_BASED_OUTPATIENT_CLINIC_OR_DEPARTMENT_OTHER): Payer: Medicare Other | Admitting: Nurse Practitioner

## 2018-07-24 VITALS — BP 150/104 | HR 70 | Temp 98.1°F | Resp 20 | Ht 74.0 in | Wt 148.0 lb

## 2018-07-24 DIAGNOSIS — R197 Diarrhea, unspecified: Secondary | ICD-10-CM | POA: Diagnosis not present

## 2018-07-24 DIAGNOSIS — D500 Iron deficiency anemia secondary to blood loss (chronic): Secondary | ICD-10-CM

## 2018-07-24 DIAGNOSIS — C7A029 Malignant carcinoid tumor of the large intestine, unspecified portion: Secondary | ICD-10-CM | POA: Insufficient documentation

## 2018-07-24 DIAGNOSIS — Z79899 Other long term (current) drug therapy: Secondary | ICD-10-CM | POA: Diagnosis not present

## 2018-07-24 DIAGNOSIS — C7B8 Other secondary neuroendocrine tumors: Secondary | ICD-10-CM

## 2018-07-24 LAB — CBC WITH DIFFERENTIAL/PLATELET
Abs Immature Granulocytes: 0.01 10*3/uL (ref 0.00–0.07)
BASOS PCT: 1 %
Basophils Absolute: 0.1 10*3/uL (ref 0.0–0.1)
EOS ABS: 0.1 10*3/uL (ref 0.0–0.5)
EOS PCT: 2 %
HCT: 37.9 % — ABNORMAL LOW (ref 39.0–52.0)
Hemoglobin: 12.6 g/dL — ABNORMAL LOW (ref 13.0–17.0)
Immature Granulocytes: 0 %
Lymphocytes Relative: 16 %
Lymphs Abs: 1 10*3/uL (ref 0.7–4.0)
MCH: 30.8 pg (ref 26.0–34.0)
MCHC: 33.2 g/dL (ref 30.0–36.0)
MCV: 92.7 fL (ref 80.0–100.0)
MONO ABS: 0.7 10*3/uL (ref 0.1–1.0)
MONOS PCT: 12 %
Neutro Abs: 4.1 10*3/uL (ref 1.7–7.7)
Neutrophils Relative %: 69 %
PLATELETS: 233 10*3/uL (ref 150–400)
RBC: 4.09 MIL/uL — ABNORMAL LOW (ref 4.22–5.81)
RDW: 13 % (ref 11.5–15.5)
WBC: 6 10*3/uL (ref 4.0–10.5)
nRBC: 0 % (ref 0.0–0.2)

## 2018-07-24 LAB — COMPREHENSIVE METABOLIC PANEL
ALT: 11 U/L (ref 0–44)
AST: 18 U/L (ref 15–41)
Albumin: 3.4 g/dL — ABNORMAL LOW (ref 3.5–5.0)
Alkaline Phosphatase: 86 U/L (ref 38–126)
Anion gap: 8 (ref 5–15)
BUN: 15 mg/dL (ref 8–23)
CHLORIDE: 104 mmol/L (ref 98–111)
CO2: 27 mmol/L (ref 22–32)
CREATININE: 0.85 mg/dL (ref 0.61–1.24)
Calcium: 9 mg/dL (ref 8.9–10.3)
GFR calc non Af Amer: >60 mL/min (ref >60)
Glucose, Bld: 88 mg/dL (ref 70–99)
Potassium: 4.1 mmol/L (ref 3.5–5.1)
Sodium: 139 mmol/L (ref 135–145)
Total Bilirubin: 0.4 mg/dL (ref 0.3–1.2)
Total Protein: 6.3 g/dL — ABNORMAL LOW (ref 6.5–8.1)

## 2018-07-24 MED ORDER — OCTREOTIDE ACETATE 30 MG IM KIT
30.0000 mg | PACK | Freq: Once | INTRAMUSCULAR | Status: AC
  Administered 2018-07-24: 30 mg via INTRAMUSCULAR

## 2018-07-24 MED ORDER — OCTREOTIDE ACETATE 30 MG IM KIT
PACK | INTRAMUSCULAR | Status: AC
  Filled 2018-07-24: qty 1

## 2018-07-24 NOTE — Patient Instructions (Signed)

## 2018-07-24 NOTE — Telephone Encounter (Signed)
Appts scheduled LMVM for patient with date/time per 10/22 los

## 2018-07-24 NOTE — Progress Notes (Signed)
[place]  [phone] Fax:([phone]  Clinic Follow up Note   Patient Care Team: Leonard Downing, MD as PCP - General (Family Medicine) 07/24/2018  SUMMARY OF ONCOLOGIC HISTORY: Oncology History   Carcinoid tumor of colon, malignant   Staging form: Colon and Rectum, AJCC 7th Edition     Clinical: Stage Unknown ([place], N2b, M1) - Unsigned       Carcinoid tumor of colon, malignant ([place])   02/16/2015 Imaging    CT abdomen and pelvis with contrast showed wall thickening of the cecum and terminal ileum high suspicious for cecal neoplasm. Extensive confluent ileal colic adenopathy extending chronically along the superior mesenteric vein, multiple hepatic metastasis    02/18/2015 Initial Diagnosis    Carcinoid tumor of colon, malignant, with liver metastasis    02/18/2015 Initial Biopsy    Liver biopsy showed metastatic low-grade neuroendocrine tumor (carcinoid). The tumor is positive for CD X2, CD 56, chromogranin, and synaptophysin.    03/05/2015 -  Chemotherapy    Sandostatin 30 mg IM every 4 weeks    03/06/2015 Imaging    CT chest showed no evidence of metastasis in the chest. 4.4 cm ascending aortic aneurysm.    09/09/2015 Imaging    Restaging CT abdomen and pelvis with contrast showed interval response of hepatic metastasis. Most are small and, some are stable. Slight interval decrease in mesenteric adenopathy and primary colon tumor.     12/06/2016 Imaging    CT CAP w contrast IMPRESSION: 1. Overall, the extent of metastatic disease to the liver and abdominal/retroperitoneal lymphatics is similar to the prior examination 08/19/2016. 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 2. Multiple small pulmonary nodules appear unchanged in size, number and distribution, favored to be benign. 3. Aortic atherosclerosis, in addition to 2 vessel coronary artery disease.  Assessment for potential risk factor modification, dietary therapy or pharmacologic therapy may be warranted, if clinically indicated. 4. In addition, there is dilatation of the aortic root (4.7 cm in diameter at the level of the sinuses of Valsalva) and ectasia of the ascending thoracic aorta measuring 4.4 cm in diameter. Recommend annual imaging followup by CTA or MRA. This recommendation follows 2010 ACCF/AHA/AATS/ACR/ASA/SCA/SCAI/SIR/STS/SVM Guidelines for the Diagnosis and Management of Patients with Thoracic Aortic Disease. Circulation. 2010; 121: I144-R154. 5. Trace volume of ascites. 6. Additional incidental findings, as above.     05/31/2017 Imaging    CT CAP W Contrast 05/31/17 IMPRESSION: 1. Overall the extent of metastatic disease to the liver and abdominal adenopathy is similar to previous exam from 12/06/2016. 2. 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 3. Stable small pulmonary nodules. 4.  Aortic Atherosclerosis (ICD10-I70.0). 5. Dilatation of the ascending thoracic aorta. Unchanged. Recommend annual imaging followup by CTA or MRA. This recommendation follows 2010 ACCF/AHA/AATS/ACR/ASA/SCA/SCAI/SIR/STS/SVM Guidelines for the Diagnosis and Management of Patients with Thoracic Aortic Disease. Circulation. 2010; 121: e266-e369 6. Trace ascites.    12/07/2017 Imaging    CT CAP W Contrast 12/07/17 IMPRESSION: Chest Impression: 1. No evidence of thoracic metastasis. 2. Stable ascending thoracic aortic aneurysm at 4.4 cm. Recommend attention on oncology surveillance. Abdomen / Pelvis Impression: 1. Mild interval increase in size of multifocal hepatic metastasis. 2. Large central small bowel mesenteric mass is unchanged 3. No bowel obstruction. 4. No skeletal lesions     06/12/2018 Imaging    06/12/2018 CT AP IMPRESSION: Large mesenteric  mass in the right mid abdomen, increased.  Multifocal hepatic  metastases, increased.  Mild upper abdominal lymphadenopathy, mildly increased.  Masslike thickening involving the terminal ileum, suspicious for metastasis, similar to the prior. Additional nonspecific wall thickening involving multiple loops of small bowel in the pelvis.  Small volume pelvic ascites, mildly increased.    06/12/2018 Progression    IMPRESSION: Large mesenteric mass in the right mid abdomen, increased.  Multifocal hepatic metastases, increased.  Mild upper abdominal lymphadenopathy, mildly increased.  Masslike thickening involving the terminal ileum, suspicious for metastasis, similar to the prior. Additional nonspecific wall thickening involving multiple loops of small bowel in the pelvis.  Small volume pelvic ascites, mildly increased.    07/18/2018 PET scan    IMPRESSION: 1. Examination is positive for extensive neuroendocrine tumor receptor avid metastatic disease involving the liver, axial and appendicular skeleton, thoracic and abdominal lymph nodes. There is a dominant mass within the mesentery which is intensely radiotracer avid with a maximum dimension of 11.9 cm and an SUV max of 44.32. Within the distal small bowel loops there is a radiotracer avid lesion likely within the distal ileum. 2. When compared with CT from 06/13/2018 the tumor volume within the abdomen is not significantly changed in the interval. 3.  Aortic Atherosclerosis (ICD10-I70.0). 4. Coronary artery atherosclerotic calcifications 5. Nonobstructing left renal calculi   CURRENT THERAPY: Sandostatin 30 mg injection every 4 weeks  INTERVAL HISTORY: Mr. Kowalke returns with his sisters as scheduled. He was last seen 9/12 and received sandostatin injection. He is doing well. Weight is stable, despite irregular eating pattern. He is fatigued but remains active. Denies n/v/c. He has frequent diarrhea, up to 3-4 episodes after each meal. Not taking imodium. Denies blood in stool. Has  intermittent RLQ pain usually after meals, aleve helps. Recently noticed lower back stiffness upon standing after prolonged periods of sitting. Denies recent fever, chills, flushing, cough, chest pain, dyspnea, or leg edema. BP is elevated today, he gets stressed when driving on [address] and coming to [facility], he denies headache or vision change. Not on BP meds.    MEDICAL HISTORY:  Past Medical History:  Diagnosis Date  . Liver metastases ([place])     SURGICAL HISTORY: Past Surgical History:  Procedure Laterality Date  . APPENDECTOMY    . KNEE SURGERY    . SHOULDER SURGERY      I have reviewed the social history and family history with the patient and they are unchanged from previous note.  ALLERGIES:  is allergic to codeine.  MEDICATIONS:  Current Outpatient Medications  Medication Sig Dispense Refill  . Multiple Vitamin (MULTIVITAMIN WITH MINERALS) TABS tablet Take 1 tablet by mouth daily. Centrum Silver 50 plus    . naproxen sodium (ANAPROX) 220 MG tablet Take 220 mg by mouth 2 (two) times daily as needed (pain).    Marland Kitchen OVER THE COUNTER MEDICATION Place 1 application into both nostrils daily as needed (congestion). Vicks inhaler stick     No current facility-administered medications for this visit.     PHYSICAL EXAMINATION: ECOG PERFORMANCE STATUS: 1 - Symptomatic but completely ambulatory  Vitals:   07/24/18 1336 07/24/18 1351  BP: (!) 166/107 (!) 150/104  Pulse: 70   Resp: 20   Temp: 98.1 F (36.7 C)   SpO2: 98%    Filed Weights   07/24/18 1336  Weight: 148 lb (67.1 kg)    GENERAL:alert, no distress and comfortable SKIN: no rashes or significant lesions EYES: sclera clear OROPHARYNX:no thrush or ulcers  LYMPH:  no palpable cervical or supraclavicular lymphadenopathy LUNGS: clear to auscultation with normal breathing effort HEART: regular rate & rhythm, no lower extremity edema ABDOMEN:abdomen soft, non-tender and normal bowel sounds Musculoskeletal:no cyanosis of  digits and no clubbing. No spinal tenderness  NEURO: alert & oriented x 3 with fluent speech, no focal motor/sensory deficits  LABORATORY DATA:  I have reviewed the data as listed CBC Latest Ref Rng & Units 07/24/2018 06/12/2018 03/08/2018  WBC 4.0 - 10.5 K/uL 6.0 5.5 6.4  Hemoglobin 13.0 - 17.0 g/dL 12.6(L) 12.5(L) 12.9(L)  Hematocrit 39.0 - 52.0 % 37.9(L) 37.2(L) 38.7  Platelets 150 - 400 K/uL 233 184 193     CMP Latest Ref Rng & Units 07/24/2018 06/12/2018 03/08/2018  Glucose 70 - 99 mg/dL 88 81 75  BUN 8 - 23 mg/dL 15 21 20   Creatinine 0.61 - 1.24 mg/dL 0.85 0.85 0.89  Sodium 135 - 145 mmol/L 139 141 141  Potassium 3.5 - 5.1 mmol/L 4.1 4.1 4.2  Chloride 98 - 111 mmol/L 104 105 107  CO2 22 - 32 mmol/L 27 27 27   Calcium 8.9 - 10.3 mg/dL 9.0 9.2 8.8  Total Protein 6.5 - 8.1 g/dL 6.3(L) 6.2(L) 6.1(L)  Total Bilirubin 0.3 - 1.2 mg/dL 0.4 0.5 0.3  Alkaline Phos 38 - 126 U/L 86 93 86  AST 15 - 41 U/L 18 16 16   ALT 0 - 44 U/L 11 8 6       RADIOGRAPHIC STUDIES: I have personally reviewed the radiological images as listed and agreed with the findings in the report. No results found.   ASSESSMENT & PLAN: 72 y.o.  Caucasian male without significant past medical history  1. 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 Springfield appears stable. He continues sandostatin injection, last received on 06/14/18. Due to disease progression on CT in 06/2018 he underwent PET NETSPOT imaging, which I reviewed with the patient and family today. I also reviewed with Dr. Burr Medico prior to today's visit. PET is positive for extensive neuroendocrine tumor receptor avid metastatic disease in the liver, axial and appendicular skeleton, thoracic and abdominal lymph nodes.  -Dr. Burr Medico previously discussed referral to Dr. Leonia Reeves to discuss lutathera therapy. Given the PET scan findings, the patient agrees to referral. I ordered today -Labs reviewed -currently has minimal pain, overall  controlled with NSAIDs; I encouraged him to let us know if this is not enough -He will proceed with sandostatin injection today. Will tentatively schedule lab and f/u in 6 weeks, but can be moved pending his decision to pursue lutathera.  -Hopefully he will be seen by Dr. Leonia Reeves in next couple of weeks.  2. Iron deficient anemia  -secondary to the blood loss from tumor -His anemia previously resolved after IV Feraheme.  -Hgb 12.6 today, stable   3. Diarrhea  -secondary to #1 -previously encouraged him to use Imodium as needed.  -His diarrhea improved and almost controlled to 2-3 times per day.  -frequency has increased lately, up to >6 times per day; I strongly urged him to restart imodium and maintain adequate po hydration, he agrees.   4. Possible right inguinal hernia  5. Financial Assistance   Plan  -Labs and imaging reviewed -referral for lutathera therapy to discuss with Dr. Leonia Reeves -sandostatin today  -f/u pending decision to pursue lutathera    Orders Placed This Encounter  Procedures  . NM LUTATHERA ADMINISTRATION    Standing Status:   Future    Standing  Expiration Date:   09/24/2019    Order Specific Question:   Reason for Exam (SYMPTOM  OR DIAGNOSIS REQUIRED)    Answer:   metastatic neuroendocrine tumor, progressed on sandostatin    Order Specific Question:   If indicated for the ordered procedure, I authorize the administration of a radiopharmaceutical per Radiology protocol    Answer:   Yes    Order Specific Question:   Preferred imaging location?    Answer:   [facility]    Order Specific Question:   Radiology Contrast Protocol - do NOT remove file path    Answer:   [email or web address]   All questions were answered. The patient knows to call the clinic with any problems, questions or concerns. No barriers to learning was detected. I spent 20 minutes counseling the patient face to face. The total time spent in the appointment  was 25 minutes and more than 50% was on counseling and review of test results     Alla Feeling, NP 07/24/18

## 2018-07-26 ENCOUNTER — Other Ambulatory Visit: Payer: Self-pay | Admitting: Hematology

## 2018-07-26 DIAGNOSIS — C7B8 Other secondary neuroendocrine tumors: Principal | ICD-10-CM

## 2018-07-26 DIAGNOSIS — C7A8 Other malignant neuroendocrine tumors: Secondary | ICD-10-CM

## 2018-08-07 ENCOUNTER — Ambulatory Visit (HOSPITAL_COMMUNITY)
Admission: RE | Admit: 2018-08-07 | Discharge: 2018-08-07 | Disposition: A | Discharge status: Home / Self Care | Payer: Medicare Other | Source: Ambulatory Visit | Attending: Hematology | Admitting: Hematology

## 2018-08-07 DIAGNOSIS — C7B8 Other secondary neuroendocrine tumors: Secondary | ICD-10-CM | POA: Insufficient documentation

## 2018-08-07 DIAGNOSIS — C7A8 Other malignant neuroendocrine tumors: Secondary | ICD-10-CM | POA: Insufficient documentation

## 2018-08-07 NOTE — Consult Note (Signed)
Chief Complaint: Patient with metastatic neuroendocrine tumor  Presents for  evaluation Peptide receptor radiotherapy (PRRT) with [id number] DOTATATE (Lutathera).  Referring Physician(s):Yeng    Patient Status: [facility] - Out-pt  History of Present Illness: Ryan Cowan is a 72 y.o. male With metastatic well-differentiated neuroendocrine tumor.  Patient was experiencing diarrhea and fatigue and underwent diagnostic CT abdomen pelvis in May 2016 which demonstrated cecal mass and liver metastasis.  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  Presumably of cecal origin.   Patient was initiated on Sandostatin injections 30 mg IM every 4 weeks beginning in June 2016.  Patient is demonstrated recent progression of lymphadenopathy by CT scan.   Patient underwent of gallium 17 DOTATATE PET scan on 10/16 19 which demonstrated intense radiotracer activity associated liver lesions.  Bulky mesenteric mass in the RIGHT lower quadrant.  Multiple small skeletal lesions are demonstrated within the sternum, thoracic spine and pelvis.  All lesions are intensely avid for Sandostatin receptor targeting radiotracer (DOTATATE).   Patient recounts moderate carcinoid symptoms including unpredictable diarrhea and fatigue.  No flushing.    Past Medical History:  Diagnosis Date  . Liver metastases [facility])     Past Surgical History:  Procedure Laterality Date  . APPENDECTOMY    . KNEE SURGERY    . SHOULDER SURGERY      Allergies: Codeine  Medications: Prior to Admission medications   Medication Sig Start Date End Date Taking? Authorizing Provider  Multiple Vitamin (MULTIVITAMIN WITH MINERALS) TABS tablet Take 1 tablet by mouth daily. Centrum Silver 50 plus    [provider]  naproxen sodium (ANAPROX) 220 MG tablet Take 220 mg by mouth 2 (two) times daily as needed (pain).    [provider]  OVER THE  COUNTER MEDICATION Place 1 application into both nostrils daily as needed (congestion). Vicks inhaler stick    [provider]     Family History  Problem Relation Age of Onset  . CAD Mother   . CAD Father   . Hypertension Father   . Hypertension Sister     Social History   Socioeconomic History  . Marital status: Single    Spouse name: Not on file  . Number of children: Not on file  . Years of education: Not on file  . Highest education level: Not on file  Occupational History  . Not on file  Social Needs  . Financial resource strain: Not on file  . Food insecurity:    Worry: Not on file    Inability: Not on file  . Transportation needs:    Medical: Not on file    Non-medical: Not on file  Tobacco Use  . Smoking status: Former Smoker    Packs/day: 2.50    Years: 50.00    Pack years: 125.00    Last attempt to quit: 10/04/2011    Years since quitting: 6.8  . Smokeless tobacco: Never Used  Substance and Sexual Activity  . Alcohol use: No  . Drug use: Not on file  . Sexual activity: Not on file  Lifestyle  . Physical activity:    Days per week: Not on file    Minutes per session: Not on file  . Stress: Not on file  Relationships  . Social connections:    Talks on phone: Not on file    Gets together: Not on file    Attends religious service: Not on file  Active member of club or organization: Not on file    Attends meetings of clubs or organizations: Not on file    Relationship status: Not on file  Other Topics Concern  . Not on file  Social History Narrative   Lives alone in his mother's home (in [place])   Has #3 sisters, [name] is POA   Prior employed in [employer] and was in [employer] 4 years   Very detail oriented in conversation    ECOG Status: 1 - Symptomatic but completely ambulatory  Review of Systems: A 12 point ROS discussed and pertinent positives are indicated in the HPI above.  All other systems are negative.  Review of  Systems  Constitutional: Positive for fatigue.  Gastrointestinal: Positive for abdominal pain (intermittant RLQ pain) and diarrhea.  Genitourinary: Positive for frequency.  Neurological: Positive for dizziness.    Vital Signs: There were no vitals taken for this visit.  Physical Exam  Constitutional: He is oriented to person, place, and time. He appears well-developed.  HENT:  Head: Normocephalic.  Eyes: Conjunctivae are normal.  Cardiovascular: Normal rate, regular rhythm, normal heart sounds and intact distal pulses.  Pulmonary/Chest: Effort normal and breath sounds normal.  Abdominal: Soft. Bowel sounds are normal. He exhibits no mass. There is no tenderness. There is no guarding.  Musculoskeletal: Normal range of motion.  Lymphadenopathy:    He has no cervical adenopathy.  Neurological: He is alert and oriented to person, place, and time.  Skin: Skin is warm. Capillary refill takes less than 2 seconds.  Psychiatric: He has a normal mood and affect.    Imaging: Nm Pet (netspot Ga 68 Dotatate) Skull Base To Mid Thigh  Result Date: 07/18/2018 CLINICAL DATA:  Subsequent treatment strategy for metastatic neuroendocrine tumor of GI tract. EXAM: NUCLEAR MEDICINE PET SKULL BASE TO THIGH TECHNIQUE: 3.8 mCi Ga 66 DOTATATE was injected intravenously. Full-ring PET imaging was performed from the skull base to thigh after the radiotracer. CT data was obtained and used for attenuation correction and anatomic localization. COMPARISON:  CT chest 12/07/2017 and CT AP 06/12/2018 FINDINGS: NECK No radiotracer activity in neck lymph nodes. Incidental CT findings: None CHEST Small right supraclavicular lymph node is radiotracer avid within SUV max of 4.09. Lymph node within the anterior mediastinum measures 1.2 cm and has an SUV max of 42.9, image 67/4. Previously this measured 1.1 cm. No radiotracer avid pulmonary nodules identified. Incidental CT finding: Aortic atherosclerosis. Ascending thoracic  aortic aneurysm is again noted measuring 4.5 cm, image number 87/4. Calcification in the LAD coronary artery noted. ABDOMEN/PELVIS Extensive radiotracer avid liver metastases identified. Index lesion within posterior right lobe measures 4.1 cm 35.7, image 112/4. On 06/12/2018 this measured the same. Left lobe of liver metastasis measures 4.2 cm and has an SUV max 41.13. On 06/12/2018 this measured the same. The index lesion within the lateral segment of left lobe measures 3.9 cm within SUV max of 43.94, image 129/4. On 06/12/2018 this measured 4.1 cm. The central mesenteric mass has a cranial caudal dimension of 11.9 cm within SUV max of 44.32. Previously this was measured at 10 cm. Adjacent right lower quadrant small bowel lesion measures 5.4 cm within SUV max of 40.52. Multiple hypermetabolic retroperitoneal lymph nodes are identified. Index aortocaval node measures 1.5 cm and has an SUV max of 50.15. Physiologic activity noted in the liver, spleen, adrenal glands and kidneys. Incidental CT findings:There is aortic atherosclerosis. Nonobstructing left renal calculi noted. Left kidney cyst. A small volume  of ascites is noted, similar to previous exam. SKELETON Extensive multifocal radiotracer avid bone metastases identified. Small lesion within the right side of the hyoid bone measures approximately 9 mm and has an SUV max of 10.03. peripherally sclerotic lucent lesion within the sternum measures 1.4 cm within SUV max of 25. Unchanged in size from 12/07/2017. Multiple lesions are identified within the thoracic and lumbar spine. Index lesion within the T7 vertebra with subtle corresponding area of increased sclerosis measures approximately 1.3 cm and has an SUV max of 50.41. Lesion within the L4 vertebra without definite CT correlate has an SUV max of 11.37. Incidental CT findings:None IMPRESSION: 1. Examination is positive for extensive neuroendocrine tumor receptor avid metastatic disease involving the liver,  axial and appendicular skeleton, thoracic and abdominal lymph nodes. There is a dominant mass within the mesentery which is intensely radiotracer avid with a maximum dimension of 11.9 cm and an SUV max of 44.32. Within the distal small bowel loops there is a radiotracer avid lesion likely within the distal ileum. 2. When compared with CT from 06/13/2018 the tumor volume within the abdomen is not significantly changed in the interval. 3.  Aortic Atherosclerosis (ICD10-I70.0). 4. Coronary artery atherosclerotic calcifications 5. Nonobstructing left renal calculi Electronically Signed   By: Kerby Moors M.D.   On: 07/18/2018 09:44     Labs:  CBC: Recent Labs    12/07/17 1104 03/08/18 1258 06/12/18 1240 07/24/18 1253  WBC 6.0 6.4 5.5 6.0  HGB 13.1 12.9* 12.5* 12.6*  HCT 40.1 38.7 37.2* 37.9*  PLT 240 193 184 233    COAGS: No results for input(s): INR, APTT in the last 8760 hours.  BMP: Recent Labs    12/07/17 1104 03/08/18 1258 06/12/18 1240 07/24/18 1253  NA 140 141 141 139  K 3.9 4.2 4.1 4.1  CL 104 107 105 104  CO2 28 27 27 27   GLUCOSE 90 75 81 88  BUN 13 20 21 15   CALCIUM 9.0 8.8 9.2 9.0  CREATININE 0.85 0.89 0.85 0.85  GFRNONAA >60 >60 >60 >60  GFRAA >60 >60 >60 >60    LIVER FUNCTION TESTS: Recent Labs    12/07/17 1104 03/08/18 1258 06/12/18 1240 07/24/18 1253  BILITOT 0.4 0.3 0.5 0.4  AST 16 16 16 18   ALT 14 6 8 11   ALKPHOS 95 86 93 86  PROT 6.3* 6.1* 6.2* 6.3*  ALBUMIN 3.5 3.6 3.4* 3.4*    TUMOR MARKERS: Recent Labs    09/08/17 1243  CHROMOGRNA 15*   Most recent Chromogranin A = 32 on 06/12/18 Assessment and Plan:   1. Well differentiated metastatic neuroendocrine tumor involving the liver skeleton, and mesentery.  Patient would be a good candidate for peptide receptor radiotherapy with tumor avid for the DOTATATE radiotracer.  Disease progression on sandostatin therapy. Patient is in relatively good physical health with some intermittent  carcinoid symptoms including diarrhea.  Patient does have chronic mild anemia which will be followed during the course of the therapy.   Patient was advised of the risks and benefits of the procedure including renal toxicity, hepatic toxicity, marrow suppression, myelodysplastic syndrome, leukemia, carcinoid crisis and mild hair loss.  Benefits including greatly reduced risks of disease progression and potential tumor shrinkage.  Radiation safety was also reviewed with patient.  Patient and family were agreeable to proceeding peptide receptor radiotherapy.   2. Therapy to include 4 separate infusions of Lu177 DOTATAE (29mCi) with a IM Sandostatin injection after each treatment.  Treatments are 2  months apart.  Patient will return to oncologist every other month to receive Sandostatin injection and CBC and CMP.]  First therapy August 21 2018.      Thank you for this interesting consult.  I greatly enjoyed meeting Ryan Cowan and look forward to participating in their care.  A copy of this report was sent to the requesting provider on this date.  Electronically Signed: Rennis Golden, MD 08/07/2018, 3:12 PM    I spent a total of  40 Minutes   in face to face in clinical consultation, greater than 50% of which was counseling/coordinating care for metastatic neuroendocrine tumor.

## 2018-08-08 ENCOUNTER — Other Ambulatory Visit: Payer: Self-pay | Admitting: Hematology

## 2018-08-08 DIAGNOSIS — C7A8 Other malignant neuroendocrine tumors: Secondary | ICD-10-CM

## 2018-08-08 DIAGNOSIS — C7B8 Other secondary neuroendocrine tumors: Principal | ICD-10-CM

## 2018-08-11 ENCOUNTER — Other Ambulatory Visit: Payer: Self-pay | Admitting: Hematology

## 2018-08-21 ENCOUNTER — Inpatient Hospital Stay: Payer: Medicare Other | Attending: Hematology

## 2018-08-21 ENCOUNTER — Ambulatory Visit (HOSPITAL_COMMUNITY)
Admission: RE | Admit: 2018-08-21 | Discharge: 2018-08-21 | Disposition: A | Discharge status: Home / Self Care | Payer: Medicare Other | Source: Ambulatory Visit | Attending: Nurse Practitioner | Admitting: Nurse Practitioner

## 2018-08-21 VITALS — BP 137/81 | HR 65 | Temp 97.8°F | Resp 18

## 2018-08-21 DIAGNOSIS — C7B8 Other secondary neuroendocrine tumors: Secondary | ICD-10-CM | POA: Insufficient documentation

## 2018-08-21 DIAGNOSIS — C7A029 Malignant carcinoid tumor of the large intestine, unspecified portion: Secondary | ICD-10-CM | POA: Insufficient documentation

## 2018-08-21 DIAGNOSIS — D500 Iron deficiency anemia secondary to blood loss (chronic): Secondary | ICD-10-CM

## 2018-08-21 LAB — COMPREHENSIVE METABOLIC PANEL
ALK PHOS: 70 U/L (ref 38–126)
ALT: 15 U/L (ref 0–44)
AST: 22 U/L (ref 15–41)
Albumin: 3.5 g/dL (ref 3.5–5.0)
Anion gap: 7 (ref 5–15)
BILIRUBIN TOTAL: 0.6 mg/dL (ref 0.3–1.2)
BUN: 17 mg/dL (ref 8–23)
CO2: 31 mmol/L (ref 22–32)
CREATININE: 0.75 mg/dL (ref 0.61–1.24)
Calcium: 8.7 mg/dL — ABNORMAL LOW (ref 8.9–10.3)
Chloride: 102 mmol/L (ref 98–111)
GFR calc Af Amer: >60 mL/min (ref >60)
Glucose, Bld: 99 mg/dL (ref 70–99)
Potassium: 3.7 mmol/L (ref 3.5–5.1)
Sodium: 140 mmol/L (ref 135–145)
TOTAL PROTEIN: 5.9 g/dL — AB (ref 6.5–8.1)

## 2018-08-21 LAB — CBC WITH DIFFERENTIAL/PLATELET
ABS IMMATURE GRANULOCYTES: 0.02 10*3/uL (ref 0.00–0.07)
Basophils Absolute: 0.1 10*3/uL (ref 0.0–0.1)
Basophils Relative: 1 %
EOS ABS: 0.2 10*3/uL (ref 0.0–0.5)
Eosinophils Relative: 3 %
HEMATOCRIT: 37.7 % — AB (ref 39.0–52.0)
HEMOGLOBIN: 12.1 g/dL — AB (ref 13.0–17.0)
Immature Granulocytes: 0 %
LYMPHS ABS: 0.8 10*3/uL (ref 0.7–4.0)
Lymphocytes Relative: 15 %
MCH: 31 pg (ref 26.0–34.0)
MCHC: 32.1 g/dL (ref 30.0–36.0)
MCV: 96.7 fL (ref 80.0–100.0)
MONOS PCT: 13 %
Monocytes Absolute: 0.7 10*3/uL (ref 0.1–1.0)
NEUTROS ABS: 3.8 10*3/uL (ref 1.7–7.7)
NEUTROS PCT: 68 %
Platelets: 216 10*3/uL (ref 150–400)
RBC: 3.9 MIL/uL — ABNORMAL LOW (ref 4.22–5.81)
RDW: 13 % (ref 11.5–15.5)
WBC: 5.6 10*3/uL (ref 4.0–10.5)
nRBC: 0 % (ref 0.0–0.2)

## 2018-08-21 MED ORDER — OCTREOTIDE ACETATE 500 MCG/ML IJ SOLN
INTRAMUSCULAR | Status: AC
  Filled 2018-08-21: qty 1

## 2018-08-21 MED ORDER — OCTREOTIDE ACETATE 30 MG IM KIT
PACK | INTRAMUSCULAR | Status: AC
  Filled 2018-08-21: qty 1

## 2018-08-21 MED ORDER — SODIUM CHLORIDE 0.9 % IV SOLN
8.0000 mg | Freq: Once | INTRAVENOUS | Status: AC
  Administered 2018-08-21: 8 mg via INTRAVENOUS
  Filled 2018-08-21: qty 4

## 2018-08-21 MED ORDER — ONDANSETRON HCL 8 MG PO TABS: 8.0000 mg | ORAL_TABLET | Freq: Two times a day (BID) | ORAL | 0 refills | Status: DC | PRN

## 2018-08-21 MED ORDER — AMINO ACID RADIOPROTECTANT - L-LYSINE 2.5%/L-ARGININE 2.5% IN NS
250.0000 mL/h | INTRAVENOUS | Status: AC
  Administered 2018-08-21: 250 mL/h via INTRAVENOUS
  Filled 2018-08-21: qty 1000

## 2018-08-21 MED ORDER — PROCHLORPERAZINE EDISYLATE 10 MG/2ML IJ SOLN
10.0000 mg | Freq: Four times a day (QID) | INTRAMUSCULAR | Status: DC | PRN
  Filled 2018-08-21: qty 2

## 2018-08-21 MED ORDER — LUTETIUM LU 177 DOTATATE 370 MBQ/ML IV SOLN
200.0000 | Freq: Once | INTRAVENOUS | Status: AC
  Administered 2018-08-21: 205.23 via INTRAVENOUS

## 2018-08-21 MED ORDER — SODIUM CHLORIDE 0.9 % IV SOLN: 500.0000 mL | Freq: Once | INTRAVENOUS | Status: DC

## 2018-08-21 MED ORDER — OCTREOTIDE ACETATE 500 MCG/ML IJ SOLN: 500.0000 ug | Freq: Once | INTRAMUSCULAR | Status: DC | PRN

## 2018-08-21 MED ORDER — OCTREOTIDE ACETATE 30 MG IM KIT
30.0000 mg | PACK | Freq: Once | INTRAMUSCULAR | Status: AC
  Administered 2018-08-21: 30 mg via INTRAMUSCULAR

## 2018-08-21 MED ORDER — OCTREOTIDE ACETATE 30 MG IM KIT: 30.0000 mg | PACK | Freq: Once | INTRAMUSCULAR | Status: DC

## 2018-08-21 NOTE — Progress Notes (Signed)
CLINICAL DATA: [age] year[age]old [male] with metastatic neuroendocrine tumor. Well differentiated tumor with somatostatin receptor is identified within the [abdominal lymph nodes, liver, and bones] by DOTATATE PET CT scan. EXAM: NUCLEAR MEDICINE LUTATHERA ADMINISTRATION TECHNIQUE: Infusion: The nuclear medicine technologist and I personally verified the dose activity ([207] mCi) to be delivered as specified in the written directive (200 mCi), and verified the patient identification via 2 separate methods. 20 gauge IV were started in the antecubital veins. Anti-emetics were administered by nursing staff. Amino acid renal protection was initiated 30 minutes prior to Lu 177 DOTATATE (Lutathera) infusion and continued continuously for 4 hours. Lutathera infusion was administered over 30 minutes.   The total administered dose was [205.2] mCi Lu 177 DOTATATE.  The entire IV tubing, venocatheter, stopcock and syringes was removed in total, placed in a disposal bag and sent for assay of the residual activity, which will be reported at a later time in our EMR by the physics staff. Pressure was applied to the venipuncture sites, and a compression bandage placed. Radiation Safety personnel were present to perform the discharge survey, as detailed on their documentation.  Patient to receive 30 mg IM long-acting Sandostatin injection 4 hours after Lutathera effusion in out patient oncology at [facility]:  [205.2] mCi Lu 177 DOTATATE  FINDINGS: Diagnosis: [Metastatic neuroendocrine tumor.]  Current Infusion: [1]  Planned Infusions: [4]  Patient reports no complications following initial consult.  Patient has mild fatigue.  Patient intermittent diarrhea.   The patient's most recent blood counts and were reviewed and remains a good candidate to proceed with Lutathera.  Patient is mildly anemic.  Renal function hepatic function normal.   The patient was situated in  an infusion suite and administered Lutathera as above. Patient will follow-up with referring oncologist for interval serum laboratories (CBC and CMP) in approximately 4 weeks.  Patient to receive IM Sandostatin at this 1 month interval.    Patient to receive 30 mg IM long-acting Sandostatin injection 4 hours after Lutathera effusion at [facility] outpatient oncology.    IMPRESSION: [First] EH 212 [id number] treatment for metastatic neuroendocrine tumor. The patient tolerated the infusion well. The patient will return in 8 weeks for ongoing care.

## 2018-08-22 ENCOUNTER — Ambulatory Visit (HOSPITAL_COMMUNITY): Payer: Medicare Other

## 2018-09-06 ENCOUNTER — Encounter: Payer: Self-pay | Admitting: Hematology

## 2018-09-06 ENCOUNTER — Inpatient Hospital Stay: Payer: Medicare Other | Attending: Hematology

## 2018-09-06 ENCOUNTER — Telehealth: Payer: Self-pay | Admitting: Hematology

## 2018-09-06 ENCOUNTER — Inpatient Hospital Stay (HOSPITAL_BASED_OUTPATIENT_CLINIC_OR_DEPARTMENT_OTHER): Payer: Medicare Other | Admitting: Hematology

## 2018-09-06 VITALS — BP 171/86 | HR 68 | Temp 98.0°F | Resp 17 | Ht 74.0 in | Wt 149.1 lb

## 2018-09-06 DIAGNOSIS — C7A029 Malignant carcinoid tumor of the large intestine, unspecified portion: Secondary | ICD-10-CM | POA: Diagnosis present

## 2018-09-06 DIAGNOSIS — C7B02 Secondary carcinoid tumors of liver: Secondary | ICD-10-CM

## 2018-09-06 DIAGNOSIS — D509 Iron deficiency anemia, unspecified: Secondary | ICD-10-CM | POA: Diagnosis not present

## 2018-09-06 DIAGNOSIS — C7B09 Secondary carcinoid tumors of other sites: Secondary | ICD-10-CM | POA: Insufficient documentation

## 2018-09-06 DIAGNOSIS — R197 Diarrhea, unspecified: Secondary | ICD-10-CM | POA: Diagnosis not present

## 2018-09-06 DIAGNOSIS — Z79899 Other long term (current) drug therapy: Secondary | ICD-10-CM | POA: Insufficient documentation

## 2018-09-06 LAB — CBC WITH DIFFERENTIAL/PLATELET
ABS IMMATURE GRANULOCYTES: 0.01 10*3/uL (ref 0.00–0.07)
BASOS ABS: 0.1 10*3/uL (ref 0.0–0.1)
BASOS PCT: 1 %
Eosinophils Absolute: 0.2 10*3/uL (ref 0.0–0.5)
Eosinophils Relative: 3 %
HCT: 36.1 % — ABNORMAL LOW (ref 39.0–52.0)
Hemoglobin: 11.9 g/dL — ABNORMAL LOW (ref 13.0–17.0)
Immature Granulocytes: 0 %
LYMPHS ABS: 0.6 10*3/uL — AB (ref 0.7–4.0)
Lymphocytes Relative: 10 %
MCH: 30.6 pg (ref 26.0–34.0)
MCHC: 33 g/dL (ref 30.0–36.0)
MCV: 92.8 fL (ref 80.0–100.0)
MONOS PCT: 13 %
Monocytes Absolute: 0.7 10*3/uL (ref 0.1–1.0)
NEUTROS ABS: 4.1 10*3/uL (ref 1.7–7.7)
NEUTROS PCT: 73 %
NRBC: 0 % (ref 0.0–0.2)
PLATELETS: 217 10*3/uL (ref 150–400)
RBC: 3.89 MIL/uL — ABNORMAL LOW (ref 4.22–5.81)
RDW: 13.2 % (ref 11.5–15.5)
WBC: 5.6 10*3/uL (ref 4.0–10.5)

## 2018-09-06 LAB — COMPREHENSIVE METABOLIC PANEL
ALBUMIN: 3.2 g/dL — AB (ref 3.5–5.0)
ALT: 9 U/L (ref 0–44)
AST: 15 U/L (ref 15–41)
Alkaline Phosphatase: 78 U/L (ref 38–126)
Anion gap: 8 (ref 5–15)
BUN: 20 mg/dL (ref 8–23)
CHLORIDE: 105 mmol/L (ref 98–111)
CO2: 27 mmol/L (ref 22–32)
CREATININE: 0.79 mg/dL (ref 0.61–1.24)
Calcium: 8.9 mg/dL (ref 8.9–10.3)
GFR calc Af Amer: >60 mL/min (ref >60)
Glucose, Bld: 97 mg/dL (ref 70–99)
POTASSIUM: 4.1 mmol/L (ref 3.5–5.1)
Sodium: 140 mmol/L (ref 135–145)
Total Bilirubin: 0.3 mg/dL (ref 0.3–1.2)
Total Protein: 6 g/dL — ABNORMAL LOW (ref 6.5–8.1)

## 2018-09-06 NOTE — Telephone Encounter (Signed)
Printed calendar and avs. °

## 2018-09-06 NOTE — Progress Notes (Addendum)
[place]   Telephone:([phone]) [phone] Fax:([phone]) [phone]   Clinic Follow up Note   Patient Care Team: Leonard Downing, MD as PCP - General (Family Medicine)  Date of Service:  09/06/2018  CHIEF COMPLAINT: F/u of Metastatic Carcinoid cancer  SUMMARY OF ONCOLOGIC HISTORY: Oncology History   Carcinoid tumor of colon, malignant   Staging form: Colon and Rectum, AJCC 7th Edition     Clinical: Stage Unknown ([place], N2b, M1) - Unsigned       Carcinoid tumor of colon, malignant ([place])   02/16/2015 Imaging    CT abdomen and pelvis with contrast showed wall thickening of the cecum and terminal ileum high suspicious for cecal neoplasm. Extensive confluent ileal colic adenopathy extending chronically along the superior mesenteric vein, multiple hepatic metastasis    02/18/2015 Initial Diagnosis    Carcinoid tumor of colon, malignant, with liver metastasis    02/18/2015 Initial Biopsy    Liver biopsy showed metastatic low-grade neuroendocrine tumor (carcinoid). The tumor is positive for CD X2, CD 56, chromogranin, and synaptophysin.    03/05/2015 - 07/24/2018 Chemotherapy    Sandostatin 30 mg IM every 4 weeks. Due to disease progression regular monthly injections stopped after 07/24/18    03/06/2015 Imaging    CT chest showed no evidence of metastasis in the chest. 4.4 cm ascending aortic aneurysm.    09/09/2015 Imaging    Restaging CT abdomen and pelvis with contrast showed interval response of hepatic metastasis. Most are small and, some are stable. Slight interval decrease in mesenteric adenopathy and primary colon tumor.     12/06/2016 Imaging    CT CAP w contrast IMPRESSION: 1. Overall, the extent of metastatic disease to the liver and abdominal/retroperitoneal lymphatics is similar to the prior examination 08/19/2016. 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 2. Multiple small pulmonary  nodules appear unchanged in size, number and distribution, favored to be benign. 3. Aortic atherosclerosis, in addition to 2 vessel coronary artery disease. Assessment for potential risk factor modification, dietary therapy or pharmacologic therapy may be warranted, if clinically indicated. 4. In addition, there is dilatation of the aortic root (4.7 cm in diameter at the level of the sinuses of Valsalva) and ectasia of the ascending thoracic aorta measuring 4.4 cm in diameter. Recommend annual imaging followup by CTA or MRA. This recommendation follows 2010 ACCF/AHA/AATS/ACR/ASA/SCA/SCAI/SIR/STS/SVM Guidelines for the Diagnosis and Management of Patients with Thoracic Aortic Disease. Circulation. 2010; 121: G644-I347. 5. Trace volume of ascites. 6. Additional incidental findings, as above.     05/31/2017 Imaging    CT CAP W Contrast 05/31/17 IMPRESSION: 1. Overall the extent of metastatic disease to the liver and abdominal adenopathy is similar to previous exam from 12/06/2016. 2. 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 3. Stable small pulmonary nodules. 4.  Aortic Atherosclerosis (ICD10-I70.0). 5. Dilatation of the ascending thoracic aorta. Unchanged. Recommend annual imaging followup by CTA or MRA. This recommendation follows 2010 ACCF/AHA/AATS/ACR/ASA/SCA/SCAI/SIR/STS/SVM Guidelines for the Diagnosis and Management of Patients with Thoracic Aortic Disease. Circulation. 2010; 121: e266-e369 6. Trace ascites.    12/07/2017 Imaging    CT CAP W Contrast 12/07/17 IMPRESSION: Chest Impression: 1. No evidence of thoracic metastasis. 2. Stable ascending thoracic aortic aneurysm at 4.4 cm. Recommend attention on oncology surveillance. Abdomen / Pelvis Impression: 1. Mild interval increase in size of multifocal hepatic metastasis. 2. Large central small bowel mesenteric mass  is unchanged 3. No bowel obstruction. 4.  No skeletal lesions     06/12/2018 Imaging    06/12/2018 CT AP IMPRESSION: Large mesenteric mass in the right mid abdomen, increased.  Multifocal hepatic metastases, increased.  Mild upper abdominal lymphadenopathy, mildly increased.  Masslike thickening involving the terminal ileum, suspicious for metastasis, similar to the prior. Additional nonspecific wall thickening involving multiple loops of small bowel in the pelvis.  Small volume pelvic ascites, mildly increased.    06/12/2018 Progression    IMPRESSION: Large mesenteric mass in the right mid abdomen, increased.  Multifocal hepatic metastases, increased.  Mild upper abdominal lymphadenopathy, mildly increased.  Masslike thickening involving the terminal ileum, suspicious for metastasis, similar to the prior. Additional nonspecific wall thickening involving multiple loops of small bowel in the pelvis.  Small volume pelvic ascites, mildly increased.    07/18/2018 PET scan    IMPRESSION: 1. Examination is positive for extensive neuroendocrine tumor receptor avid metastatic disease involving the liver, axial and appendicular skeleton, thoracic and abdominal lymph nodes. There is a dominant mass within the mesentery which is intensely radiotracer avid with a maximum dimension of 11.9 cm and an SUV max of 44.32. Within the distal small bowel loops there is a radiotracer avid lesion likely within the distal ileum. 2. When compared with CT from 06/13/2018 the tumor volume within the abdomen is not significantly changed in the interval. 3.  Aortic Atherosclerosis (ICD10-I70.0). 4. Coronary artery atherosclerotic calcifications 5. Nonobstructing left renal calculi    08/21/2018 -  Chemotherapy    Lutathera treatment with Sandostatin injection 08/21/18, 1/14/120, 12/11/18, 02/05/19      CURRENT THERAPY:  Lutathera with Sandostatin injection 08/21/18, 1/14/120, 12/11/18, 02/05/19  INTERVAL HISTORY:  Ryan Cowan is  here for a follow up of metastatic carcinoid tumor. She presents to the clinic today accompanied by his family. He notes he completed his first Lutathera treatment. They had issues finding his vein so he was left with large bruise.  He notes diarrhea about 95% of his BMs 3-4 times a day. He forgets to pick up imodium up to use. He notes one episode of abdominal cramp that lasted for 10 seconds. Has not returned.    REVIEW OF SYSTEMS:   Constitutional: Denies fevers, chills or abnormal weight loss Eyes: Denies blurriness of vision Ears, nose, mouth, throat, and face: Denies mucositis or sore throat Respiratory: Denies cough, dyspnea or wheezes Cardiovascular: Denies palpitation, chest discomfort or lower extremity swelling Gastrointestinal:  Denies nausea, heartburn or change in bowel habits (+) Diarrhea 3-4 times a day  Skin: Denies abnormal skin rashes (+) bruise on anterior left forearm Lymphatics: Denies new lymphadenopathy or easy bruising Neurological:Denies numbness, tingling or new weaknesses Behavioral/Psych: Mood is stable, no new changes  All other systems were reviewed with the patient and are negative.  MEDICAL HISTORY:  Past Medical History:  Diagnosis Date  . Liver metastases ([place])     SURGICAL HISTORY: Past Surgical History:  Procedure Laterality Date  . APPENDECTOMY    . KNEE SURGERY    . SHOULDER SURGERY      I have reviewed the social history and family history with the patient and they are unchanged from previous note.  ALLERGIES:  is allergic to codeine.  MEDICATIONS:  Current Outpatient Medications  Medication Sig Dispense Refill  . Multiple Vitamin (MULTIVITAMIN WITH MINERALS) TABS tablet Take 1 tablet by mouth daily. Centrum Silver 50 plus    . naproxen sodium (ANAPROX) 220 MG tablet Take 220 mg  by mouth 2 (two) times daily as needed (pain).    . ondansetron (ZOFRAN) 8 MG tablet Take 1 tablet (8 mg total) by mouth 2 (two) times daily as needed for nausea  or vomiting. 20 tablet 0  . OVER THE COUNTER MEDICATION Place 1 application into both nostrils daily as needed (congestion). Vicks inhaler stick     No current facility-administered medications for this visit.     PHYSICAL EXAMINATION: ECOG PERFORMANCE STATUS: 1 - Symptomatic but completely ambulatory  Vitals:   09/06/18 1344 09/06/18 1348  BP: (!) 166/97 (!) 171/86  Pulse: 68   Resp: 17   Temp: 98 F (36.7 C)   SpO2: 90%    Filed Weights   09/06/18 1344  Weight: 149 lb 1.6 oz (67.6 kg)    GENERAL:alert, no distress and comfortable SKIN: skin color, texture, turgor are normal, no rashes or significant lesions EYES: normal, Conjunctiva are pink and non-injected, sclera clear OROPHARYNX:no exudate, no erythema and lips, buccal mucosa, and tongue normal  NECK: supple, thyroid normal size, non-tender, without nodularity LYMPH:  no palpable lymphadenopathy in the cervical, axillary or inguinal LUNGS: clear to auscultation and percussion with normal breathing effort HEART: regular rate & rhythm and no murmurs and no lower extremity edema ABDOMEN:abdomen soft, non-tender and normal bowel sounds Musculoskeletal:no cyanosis of digits and no clubbing  NEURO: alert & oriented x 3 with fluent speech, no focal motor/sensory deficits  LABORATORY DATA:  I have reviewed the data as listed CBC Latest Ref Rng & Units 09/06/2018 08/21/2018 07/24/2018  WBC 4.0 - 10.5 K/uL 5.6 5.6 6.0  Hemoglobin 13.0 - 17.0 g/dL 11.9(L) 12.1(L) 12.6(L)  Hematocrit 39.0 - 52.0 % 36.1(L) 37.7(L) 37.9(L)  Platelets 150 - 400 K/uL 217 216 233     CMP Latest Ref Rng & Units 09/06/2018 08/21/2018 07/24/2018  Glucose 70 - 99 mg/dL 97 99 88  BUN 8 - 23 mg/dL 20 17 15   Creatinine 0.61 - 1.24 mg/dL 0.79 0.75 0.85  Sodium 135 - 145 mmol/L 140 140 139  Potassium 3.5 - 5.1 mmol/L 4.1 3.7 4.1  Chloride 98 - 111 mmol/L 105 102 104  CO2 22 - 32 mmol/L 27 31 27   Calcium 8.9 - 10.3 mg/dL 8.9 8.7(L) 9.0  Total Protein  6.5 - 8.1 g/dL 6.0(L) 5.9(L) 6.3(L)  Total Bilirubin 0.3 - 1.2 mg/dL 0.3 0.6 0.4  Alkaline Phos 38 - 126 U/L 78 70 86  AST 15 - 41 U/L 15 22 18   ALT 0 - 44 U/L 9 15 11       RADIOGRAPHIC STUDIES: I have personally reviewed the radiological images as listed and agreed with the findings in the report. No results found.   ASSESSMENT & PLAN:  OLANREWAJU OSBORN is a 72 y.o. male with   1. 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 He has been treated with Sandostatin injections since 03/2015. He had a recent disease progression in 06/2018. He started Lutathera with Dr. Leonia Reeves on 08/21/18, tolerated well. He has 3 more treatments left. Gets Sandostatin injection after treatment. Tolerating well.  -I encouraged him to contact me if he develops any concerning or unexpected symptoms.  -will repeat staging scan after he completes lutathera therapy, if good response, will observe him afterwards -lab reviewed, no concerns, exam was unremarkable. He is clinically doing well  -F/u in 12/2018   2. 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  -Hg at 11.9 today (09/06/18)   3. Diarrhea  -secondary to #1 -After Lutathera, diarrhea still mostly controlled. I advised him to use imodium at least BID if he develops loose stool.    4. Possible right inguinal hernia  -Patient complains of having a "knot" in the groin area. -I have previously dicussed with the patient and we have agreed to just continue watching the area as long as no pain is present.  -Stable with no pain     Plan  -Injection at 2:30pm on 1/14 and 3/10 after Lutathera therapy  -Lab and f/u on 3/9 or 3/6    No problem-specific Assessment & Plan notes found for this encounter.   No orders of the defined types were placed in this  encounter.  All questions were answered. The patient knows to call the clinic with any problems, questions or concerns. No barriers to learning was detected. I spent 20 minutes counseling the patient face to face. The total time spent in the appointment was 25 minutes and more than 50% was on counseling and review of test results     [name], MD 09/06/2018   I, Joslyn Devon, am acting as scribe for [name], MD.   I have reviewed the above documentation for accuracy and completeness, and I agree with the above.

## 2018-09-10 LAB — CHROMOGRANIN A: Chromogranin A: 20 nmol/L — ABNORMAL HIGH (ref 0–5)

## 2018-10-16 ENCOUNTER — Encounter (HOSPITAL_COMMUNITY)
Admission: RE | Admit: 2018-10-16 | Discharge: 2018-10-16 | Disposition: A | Discharge status: Home / Self Care | Payer: Medicare Other | Source: Ambulatory Visit | Attending: Hematology | Admitting: Hematology

## 2018-10-16 ENCOUNTER — Inpatient Hospital Stay: Payer: Medicare Other | Attending: Hematology

## 2018-10-16 VITALS — BP 156/93 | HR 61 | Resp 17

## 2018-10-16 DIAGNOSIS — C7B8 Other secondary neuroendocrine tumors: Secondary | ICD-10-CM | POA: Diagnosis present

## 2018-10-16 DIAGNOSIS — C7B02 Secondary carcinoid tumors of liver: Secondary | ICD-10-CM | POA: Diagnosis not present

## 2018-10-16 DIAGNOSIS — C7B09 Secondary carcinoid tumors of other sites: Secondary | ICD-10-CM | POA: Insufficient documentation

## 2018-10-16 DIAGNOSIS — C7A029 Malignant carcinoid tumor of the large intestine, unspecified portion: Secondary | ICD-10-CM | POA: Diagnosis not present

## 2018-10-16 DIAGNOSIS — C7A8 Other malignant neuroendocrine tumors: Secondary | ICD-10-CM | POA: Diagnosis present

## 2018-10-16 DIAGNOSIS — R197 Diarrhea, unspecified: Secondary | ICD-10-CM | POA: Diagnosis not present

## 2018-10-16 DIAGNOSIS — D500 Iron deficiency anemia secondary to blood loss (chronic): Secondary | ICD-10-CM

## 2018-10-16 LAB — CBC WITH DIFFERENTIAL/PLATELET
Abs Immature Granulocytes: 0.01 10*3/uL (ref 0.00–0.07)
Basophils Absolute: 0.1 10*3/uL (ref 0.0–0.1)
Basophils Relative: 2 %
EOS ABS: 0.1 10*3/uL (ref 0.0–0.5)
EOS PCT: 4 %
HEMATOCRIT: 38.7 % — AB (ref 39.0–52.0)
Hemoglobin: 12.7 g/dL — ABNORMAL LOW (ref 13.0–17.0)
Immature Granulocytes: 0 %
Lymphocytes Relative: 15 %
Lymphs Abs: 0.5 10*3/uL — ABNORMAL LOW (ref 0.7–4.0)
MCH: 31.4 pg (ref 26.0–34.0)
MCHC: 32.8 g/dL (ref 30.0–36.0)
MCV: 95.6 fL (ref 80.0–100.0)
MONO ABS: 0.6 10*3/uL (ref 0.1–1.0)
Monocytes Relative: 17 %
NRBC: 0.9 % — AB (ref 0.0–0.2)
Neutro Abs: 2.1 10*3/uL (ref 1.7–7.7)
Neutrophils Relative %: 62 %
Platelets: 174 10*3/uL (ref 150–400)
RBC: 4.05 MIL/uL — AB (ref 4.22–5.81)
RDW: 13.3 % (ref 11.5–15.5)
WBC: 3.4 10*3/uL — AB (ref 4.0–10.5)

## 2018-10-16 LAB — COMPREHENSIVE METABOLIC PANEL
ALK PHOS: 76 U/L (ref 38–126)
ALT: 14 U/L (ref 0–44)
ANION GAP: 7 (ref 5–15)
AST: 21 U/L (ref 15–41)
Albumin: 3.9 g/dL (ref 3.5–5.0)
BILIRUBIN TOTAL: 0.8 mg/dL (ref 0.3–1.2)
BUN: 16 mg/dL (ref 8–23)
CALCIUM: 9 mg/dL (ref 8.9–10.3)
CO2: 29 mmol/L (ref 22–32)
Chloride: 104 mmol/L (ref 98–111)
Creatinine, Ser: 0.75 mg/dL (ref 0.61–1.24)
GFR calc non Af Amer: >60 mL/min (ref >60)
Glucose, Bld: 85 mg/dL (ref 70–99)
POTASSIUM: 3.8 mmol/L (ref 3.5–5.1)
SODIUM: 140 mmol/L (ref 135–145)
TOTAL PROTEIN: 6.5 g/dL (ref 6.5–8.1)

## 2018-10-16 MED ORDER — OCTREOTIDE ACETATE 30 MG IM KIT
PACK | INTRAMUSCULAR | Status: AC
  Filled 2018-10-16: qty 1

## 2018-10-16 MED ORDER — LUTETIUM LU 177 DOTATATE 370 MBQ/ML IV SOLN
200.0000 | Freq: Once | INTRAVENOUS | Status: AC
  Administered 2018-10-16: 202 via INTRAVENOUS

## 2018-10-16 MED ORDER — OCTREOTIDE ACETATE 30 MG IM KIT
30.0000 mg | PACK | Freq: Once | INTRAMUSCULAR | Status: AC
  Administered 2018-10-16: 30 mg via INTRAMUSCULAR

## 2018-10-16 MED ORDER — OCTREOTIDE ACETATE 500 MCG/ML IJ SOLN
INTRAMUSCULAR | Status: AC
  Filled 2018-10-16: qty 1

## 2018-10-16 MED ORDER — AMINO ACID RADIOPROTECTANT - L-LYSINE 2.5%/L-ARGININE 2.5% IN NS
250.0000 mL/h | INTRAVENOUS | Status: AC
  Administered 2018-10-16: 250 mL/h via INTRAVENOUS
  Filled 2018-10-16: qty 1000

## 2018-10-16 MED ORDER — SODIUM CHLORIDE 0.9 % IV SOLN
8.0000 mg | Freq: Once | INTRAVENOUS | Status: AC
  Administered 2018-10-16: 8 mg via INTRAVENOUS
  Filled 2018-10-16: qty 4

## 2018-10-16 MED ORDER — OCTREOTIDE ACETATE 30 MG IM KIT: 30.0000 mg | PACK | Freq: Once | INTRAMUSCULAR | Status: DC

## 2018-10-16 MED ORDER — SODIUM CHLORIDE 0.9 % IV SOLN: 500.0000 mL | Freq: Once | INTRAVENOUS | Status: DC

## 2018-10-16 MED ORDER — ONDANSETRON HCL 8 MG PO TABS: 8.0000 mg | ORAL_TABLET | Freq: Two times a day (BID) | ORAL | 0 refills | Status: DC | PRN

## 2018-10-16 MED ORDER — OCTREOTIDE ACETATE 500 MCG/ML IJ SOLN: 500.0000 ug | Freq: Once | INTRAMUSCULAR | Status: DC | PRN

## 2018-10-16 MED ORDER — PROCHLORPERAZINE EDISYLATE 10 MG/2ML IJ SOLN
10.0000 mg | Freq: Four times a day (QID) | INTRAMUSCULAR | Status: DC | PRN
  Filled 2018-10-16: qty 2

## 2018-10-16 NOTE — Progress Notes (Signed)
CLINICAL DATA: [age] year[age]old [male] with metastatic neuroendocrine tumor. Well differentiated tumor with somatostatin receptor is identified within the [mesentery, liver and skeleton] by DOTATATE PET CT scan. EXAM: NUCLEAR MEDICINE LUTATHERA ADMINISTRATION TECHNIQUE: Infusion: The nuclear medicine technologist and I personally verified the dose activity ([204] mCi) to be delivered as specified in the written directive (200 mCi), and verified the patient identification via 2 separate methods. 20 gauge IV were started in the antecubital veins. Anti-emetics were administered by nursing staff. Amino acid renal protection was initiated 30 minutes prior to Lu 177 DOTATATE (Lutathera) infusion and continued continuously for 4 hours. Lutathera infusion was administered over 30 minutes.   The total administered dose was [202] mCi Lu 177 DOTATATE.  The entire IV tubing, venocatheter, stopcock and syringes was removed in total, placed in a disposal bag and sent for assay of the residual activity, which will be reported at a later time in our EMR by the physics staff. Pressure was applied to the venipuncture sites, and a compression bandage placed. Radiation Safety personnel were present to perform the discharge survey, as detailed on their documentation.  Patient received 30 mg IM long-acting Sandostatin injection 4 hours after Lutathera effusion in the nuclear medicine department.  RADIOPHARMACEUTICALS:  [Two hundred two] mCi Lu 177 DOTATATE  FINDINGS: Diagnosis: [Metastatic neuroendocrine tumor.]  Current Infusion: [Second]  Planned Infusions: [4]  Patient reports [minimal] interval symptoms following therapy.  Mild intermittent diarrhea.  The patient's most recent blood counts were reviewed and remains a good candidate to proceed with Lutathera.  Patient's white blood cell count is mildly depressed at 3.4 K. Patient remains mildly chronically anemic. Liver function and hepatic  function normal.  The patient was situated in an infusion suite and administered Lutathera as above. Patient will follow-up with referring oncologist for interval serum laboratories (CBC and CMP) in approximately 4 weeks.    Patient received 30 mg IM long-acting Sandostatin injection 4 hours after Lutathera effusion in the [facility] oncology department.   IMPRESSION: [Second] UJ [id number] treatment for metastatic neuroendocrine tumor. The patient tolerated the infusion well. The patient will return in 8 weeks for ongoing care.

## 2018-10-16 NOTE — Patient Instructions (Signed)

## 2018-11-21 ENCOUNTER — Other Ambulatory Visit: Payer: Self-pay | Admitting: Hematology

## 2018-11-21 DIAGNOSIS — C7A029 Malignant carcinoid tumor of the large intestine, unspecified portion: Secondary | ICD-10-CM

## 2018-12-05 NOTE — Progress Notes (Signed)
[place]   [phone] [phone]   Clinic Follow up Note   Patient Care Team: Leonard Downing, MD as PCP - General (Family Medicine)  Date of Service:  12/07/2018  CHIEF COMPLAINT:  F/u of Metastatic Carcinoid cancer  SUMMARY OF ONCOLOGIC HISTORY: Oncology History   Carcinoid tumor of colon, malignant   Staging form: Colon and Rectum, AJCC 7th Edition     Clinical: Stage Unknown ([place], N2b, M1) - Unsigned       Carcinoid tumor of colon, malignant ([place])   02/16/2015 Imaging    CT abdomen and pelvis with contrast showed wall thickening of the cecum and terminal ileum high suspicious for cecal neoplasm. Extensive confluent ileal colic adenopathy extending chronically along the superior mesenteric vein, multiple hepatic metastasis    02/18/2015 Initial Diagnosis    Carcinoid tumor of colon, malignant, with liver metastasis    02/18/2015 Initial Biopsy    Liver biopsy showed metastatic low-grade neuroendocrine tumor (carcinoid). The tumor is positive for CD X2, CD 56, chromogranin, and synaptophysin.    03/05/2015 - 07/24/2018 Chemotherapy    Sandostatin 30 mg IM every 4 weeks. Due to disease progression regular monthly injections stopped after 07/24/18    03/06/2015 Imaging    CT chest showed no evidence of metastasis in the chest. 4.4 cm ascending aortic aneurysm.    09/09/2015 Imaging    Restaging CT abdomen and pelvis with contrast showed interval response of hepatic metastasis. Most are small and, some are stable. Slight interval decrease in mesenteric adenopathy and primary colon tumor.     12/06/2016 Imaging    CT CAP w contrast IMPRESSION: 1. Overall, the extent of metastatic disease to the liver and abdominal/retroperitoneal lymphatics is similar to the prior examination 08/19/2016. 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 2. Multiple small pulmonary  nodules appear unchanged in size, number and distribution, favored to be benign. 3. Aortic atherosclerosis, in addition to 2 vessel coronary artery disease. Assessment for potential risk factor modification, dietary therapy or pharmacologic therapy may be warranted, if clinically indicated. 4. In addition, there is dilatation of the aortic root (4.7 cm in diameter at the level of the sinuses of Valsalva) and ectasia of the ascending thoracic aorta measuring 4.4 cm in diameter. Recommend annual imaging followup by CTA or MRA. This recommendation follows 2010 ACCF/AHA/AATS/ACR/ASA/SCA/SCAI/SIR/STS/SVM Guidelines for the Diagnosis and Management of Patients with Thoracic Aortic Disease. Circulation. 2010; 121: A540-J811. 5. Trace volume of ascites. 6. Additional incidental findings, as above.     05/31/2017 Imaging    CT CAP W Contrast 05/31/17 IMPRESSION: 1. Overall the extent of metastatic disease to the liver and abdominal adenopathy is similar to previous exam from 12/06/2016. 2. 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 3. Stable small pulmonary nodules. 4.  Aortic Atherosclerosis (ICD10-I70.0). 5. Dilatation of the ascending thoracic aorta. Unchanged. Recommend annual imaging followup by CTA or MRA. This recommendation follows 2010 ACCF/AHA/AATS/ACR/ASA/SCA/SCAI/SIR/STS/SVM Guidelines for the Diagnosis and Management of Patients with Thoracic Aortic Disease. Circulation. 2010; 121: e266-e369 6. Trace ascites.    12/07/2017 Imaging    CT CAP W Contrast 12/07/17 IMPRESSION: Chest Impression: 1. No evidence of thoracic metastasis. 2. Stable ascending thoracic aortic aneurysm at 4.4 cm. Recommend attention on oncology surveillance. Abdomen / Pelvis Impression: 1. Mild interval increase in size of multifocal hepatic metastasis. 2. Large central small bowel mesenteric  mass is unchanged 3. No bowel obstruction. 4.  No skeletal lesions     06/12/2018 Imaging    06/12/2018 CT AP IMPRESSION: Large mesenteric mass in the right mid abdomen, increased.  Multifocal hepatic metastases, increased.  Mild upper abdominal lymphadenopathy, mildly increased.  Masslike thickening involving the terminal ileum, suspicious for metastasis, similar to the prior. Additional nonspecific wall thickening involving multiple loops of small bowel in the pelvis.  Small volume pelvic ascites, mildly increased.    06/12/2018 Progression    IMPRESSION: Large mesenteric mass in the right mid abdomen, increased.  Multifocal hepatic metastases, increased.  Mild upper abdominal lymphadenopathy, mildly increased.  Masslike thickening involving the terminal ileum, suspicious for metastasis, similar to the prior. Additional nonspecific wall thickening involving multiple loops of small bowel in the pelvis.  Small volume pelvic ascites, mildly increased.    07/18/2018 PET scan    IMPRESSION: 1. Examination is positive for extensive neuroendocrine tumor receptor avid metastatic disease involving the liver, axial and appendicular skeleton, thoracic and abdominal lymph nodes. There is a dominant mass within the mesentery which is intensely radiotracer avid with a maximum dimension of 11.9 cm and an SUV max of 44.32. Within the distal small bowel loops there is a radiotracer avid lesion likely within the distal ileum. 2. When compared with CT from 06/13/2018 the tumor volume within the abdomen is not significantly changed in the interval. 3.  Aortic Atherosclerosis (ICD10-I70.0). 4. Coronary artery atherosclerotic calcifications 5. Nonobstructing left renal calculi    08/21/2018 -  Chemotherapy    Lutathera treatment with Sandostatin injection 08/21/18, 10/16/18, 12/11/18, 02/05/19      CURRENT THERAPY:  Lutathera with Sandostatin injection 08/21/18, 1/14/120, 12/11/18, 02/05/19  INTERVAL HISTORY:  Ryan Cowan is here  for a follow up of treatment. He presents to the clinic today with his family member. He notes he is doing well. He plans to have 3rd treatment on 12/11/18. He sees Dr. Leonia Reeves with each treatment. After each treatment he feels fatigued for a few days but overall tolerable. He still has adequate appetite and overall feels at baseline. He denies fever or pain.  He notes his bowel movements are normal as long as he eats properly. He notes indentation of socks in ankles due to edema. This is not constant. He also notes neck pain which he attributes to the way her sleeps in his recliner. This pain has improved which thinks is his cervical spine met improved.     REVIEW OF SYSTEMS:   Constitutional: Denies fevers, chills or abnormal weight loss Eyes: Denies blurriness of vision Ears, nose, mouth, throat, and face: Denies mucositis or sore throat Respiratory: Denies cough, dyspnea or wheezes Cardiovascular: Denies palpitation, chest discomfort (+) Mild b/l  lower extremity swelling Gastrointestinal:  Denies nausea, heartburn or change in bowel habits Skin: Denies abnormal skin rashes Lymphatics: Denies new lymphadenopathy or easy bruising Neurological:Denies numbness, tingling or new weaknesses Behavioral/Psych: Mood is stable, no new changes  All other systems were reviewed with the patient and are negative.  MEDICAL HISTORY:  Past Medical History:  Diagnosis Date  . Liver metastases ([place])     SURGICAL HISTORY: Past Surgical History:  Procedure Laterality Date  . APPENDECTOMY    . KNEE SURGERY    . SHOULDER SURGERY      I have reviewed the social history and family history with the patient and they are unchanged from previous note.  ALLERGIES:  is allergic to codeine.  MEDICATIONS:  Current Outpatient Medications  Medication Sig Dispense Refill  . Multiple Vitamin (MULTIVITAMIN WITH MINERALS) TABS tablet Take 1 tablet by mouth daily. Centrum Silver 50 plus    . naproxen sodium  (ANAPROX) 220 MG tablet Take 220 mg by mouth 2 (two) times daily as needed (pain).    . ondansetron (ZOFRAN) 8 MG tablet Take 1 tablet (8 mg total) by mouth 2 (two) times daily as needed for nausea or vomiting. 20 tablet 0  . OVER THE COUNTER MEDICATION Place 1 application into both nostrils daily as needed (congestion). Vicks inhaler stick     No current facility-administered medications for this visit.     PHYSICAL EXAMINATION: ECOG PERFORMANCE STATUS: 0 - Asymptomatic  Vitals:   12/07/18 1342  BP: (!) 164/89  Pulse: 68  Resp: 20  Temp: 98.4 F (36.9 C)  SpO2: 95%   Filed Weights   12/07/18 1342  Weight: 150 lb 1.6 oz (68.1 kg)    GENERAL:alert, no distress and comfortable SKIN: skin color, texture, turgor are normal, no rashes or significant lesions EYES: normal, Conjunctiva are pink and non-injected, sclera clear OROPHARYNX:no exudate, no erythema and lips, buccal mucosa, and tongue normal  NECK: supple, thyroid normal size, non-tender, without nodularity LYMPH:  no palpable lymphadenopathy in the cervical, axillary or inguinal LUNGS: clear to auscultation and percussion with normal breathing effort HEART: regular rate & rhythm and no murmurs (+) Mild b/l lower extremity edema ABDOMEN:abdomen soft, non-tender and normal bowel sounds Musculoskeletal:no cyanosis of digits and no clubbing  NEURO: alert & oriented x 3 with fluent speech, no focal motor/sensory deficits  LABORATORY DATA:  I have reviewed the data as listed CBC Latest Ref Rng & Units 12/07/2018 10/16/2018 09/06/2018  WBC 4.0 - 10.5 K/uL 3.2(L) 3.4(L) 5.6  Hemoglobin 13.0 - 17.0 g/dL 12.0(L) 12.7(L) 11.9(L)  Hematocrit 39.0 - 52.0 % 35.4(L) 38.7(L) 36.1(L)  Platelets 150 - 400 K/uL 144(L) 174 217     CMP Latest Ref Rng & Units 12/07/2018 10/16/2018 09/06/2018  Glucose 70 - 99 mg/dL 96 85 97  BUN 8 - 23 mg/dL 26(H) 16 20  Creatinine 0.61 - 1.24 mg/dL 0.87 0.75 0.79  Sodium 135 - 145 mmol/L 139 140 140    Potassium 3.5 - 5.1 mmol/L 4.0 3.8 4.1  Chloride 98 - 111 mmol/L 106 104 105  CO2 22 - 32 mmol/L _0 Calcium 8.9 - 10.3 mg/dL 8.9 9.0 8.9  Total Protein 6.5 - 8.1 g/dL 6.1(L) 6.5 6.0(L)  Total Bilirubin 0.3 - 1.2 mg/dL 0.5 0.8 0.3  Alkaline Phos 38 - 126 U/L 76 76 78  AST 15 - 41 U/L _1 ALT 0 - 44 U/L _2 RADIOGRAPHIC STUDIES: I have personally reviewed the radiological images as listed and agreed with the findings in the report. No results found.   ASSESSMENT & PLAN:  Ryan Cowan is a 73 y.o. male with   1. 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 He has been treated with Sandostatin injections since 03/2015. He had disease progression in 06/2018 with bone mets in spine, pelvic bone and sternum. He started Lutathera with Dr. Leonia Reeves on 08/21/18, tolerates well with manageable fatigue. He is currently s/p 2 treatments.  -Labs reviewed, CBC WNL WBC at 3.2, HG at 12, PLT at 144K. CMP WNL. Chromogranin A and 24 hour urine is still pending. Third Lutathera treatment on 3/10 and  last on 5/5.  -He has developed a mild leukopenia and thrombocytopenia, likely related to [place] treatment. It's overall mild, adequate for 3rd dose next week  -Will repeat lab before 4th treatment.  -Plan to repeat Dotatate-PET scan 3 months after last treatment to evaluate response and proceed with observation.  -Exam was unremarkable. He is clinically doing well  -F/u in 3 months   2. 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  -Hg stable at 12 today (12/07/18)    3. Diarrhea  -secondary to #1 -Mostly resolved now. Watching his diet helps.    4. Possible right inguinal hernia  -Patient complains of having a "knot" in the groin area. -I have previously dicussed with the  patient and we have agreed to just continue watching the area as long as no pain is present.  -Stable with no pain    Plan  -Pt is doing clinically well, lab reviewed, will proceed third Lutathera treatment next week -Lab and f/u in 3 months    No problem-specific Assessment & Plan notes found for this encounter.   No orders of the defined types were placed in this encounter.  All questions were answered. The patient knows to call the clinic with any problems, questions or concerns. No barriers to learning was detected. I spent 20 minutes counseling the patient face to face. The total time spent in the appointment was 25 minutes and more than 50% was on counseling and review of test results     [name], MD 12/07/2018   I, Joslyn Devon, am acting as scribe for [name], MD.   I have reviewed the above documentation for accuracy and completeness, and I agree with the above.

## 2018-12-07 ENCOUNTER — Encounter: Payer: Self-pay | Admitting: Hematology

## 2018-12-07 ENCOUNTER — Telehealth: Payer: Self-pay | Admitting: Hematology

## 2018-12-07 ENCOUNTER — Inpatient Hospital Stay: Payer: Medicare Other | Attending: Hematology

## 2018-12-07 ENCOUNTER — Inpatient Hospital Stay (HOSPITAL_BASED_OUTPATIENT_CLINIC_OR_DEPARTMENT_OTHER): Payer: Medicare Other | Admitting: Hematology

## 2018-12-07 VITALS — BP 164/89 | HR 68 | Temp 98.4°F | Resp 20 | Ht 74.0 in | Wt 150.1 lb

## 2018-12-07 DIAGNOSIS — C7B02 Secondary carcinoid tumors of liver: Secondary | ICD-10-CM | POA: Diagnosis not present

## 2018-12-07 DIAGNOSIS — D500 Iron deficiency anemia secondary to blood loss (chronic): Secondary | ICD-10-CM

## 2018-12-07 DIAGNOSIS — D509 Iron deficiency anemia, unspecified: Secondary | ICD-10-CM | POA: Diagnosis not present

## 2018-12-07 DIAGNOSIS — C7B09 Secondary carcinoid tumors of other sites: Secondary | ICD-10-CM

## 2018-12-07 DIAGNOSIS — D6959 Other secondary thrombocytopenia: Secondary | ICD-10-CM | POA: Diagnosis not present

## 2018-12-07 DIAGNOSIS — I700 Atherosclerosis of aorta: Secondary | ICD-10-CM | POA: Insufficient documentation

## 2018-12-07 DIAGNOSIS — D72819 Decreased white blood cell count, unspecified: Secondary | ICD-10-CM | POA: Diagnosis not present

## 2018-12-07 DIAGNOSIS — C7A029 Malignant carcinoid tumor of the large intestine, unspecified portion: Secondary | ICD-10-CM

## 2018-12-07 DIAGNOSIS — K625 Hemorrhage of anus and rectum: Secondary | ICD-10-CM

## 2018-12-07 LAB — COMPREHENSIVE METABOLIC PANEL
ALBUMIN: 3.8 g/dL (ref 3.5–5.0)
ALK PHOS: 76 U/L (ref 38–126)
ALT: 14 U/L (ref 0–44)
AST: 20 U/L (ref 15–41)
Anion gap: 6 (ref 5–15)
BUN: 26 mg/dL — ABNORMAL HIGH (ref 8–23)
CALCIUM: 8.9 mg/dL (ref 8.9–10.3)
CO2: 27 mmol/L (ref 22–32)
CREATININE: 0.87 mg/dL (ref 0.61–1.24)
Chloride: 106 mmol/L (ref 98–111)
GFR calc Af Amer: >60 mL/min (ref >60)
GFR calc non Af Amer: >60 mL/min (ref >60)
GLUCOSE: 96 mg/dL (ref 70–99)
Potassium: 4 mmol/L (ref 3.5–5.1)
SODIUM: 139 mmol/L (ref 135–145)
Total Bilirubin: 0.5 mg/dL (ref 0.3–1.2)
Total Protein: 6.1 g/dL — ABNORMAL LOW (ref 6.5–8.1)

## 2018-12-07 LAB — CBC WITH DIFFERENTIAL/PLATELET
Abs Immature Granulocytes: 0.03 10*3/uL (ref 0.00–0.07)
BASOS ABS: 0.1 10*3/uL (ref 0.0–0.1)
Basophils Relative: 2 %
Eosinophils Absolute: 0.1 10*3/uL (ref 0.0–0.5)
Eosinophils Relative: 2 %
HEMATOCRIT: 35.4 % — AB (ref 39.0–52.0)
HEMOGLOBIN: 12 g/dL — AB (ref 13.0–17.0)
IMMATURE GRANULOCYTES: 1 %
LYMPHS ABS: 0.5 10*3/uL — AB (ref 0.7–4.0)
Lymphocytes Relative: 16 %
MCH: 32.7 pg (ref 26.0–34.0)
MCHC: 33.9 g/dL (ref 30.0–36.0)
MCV: 96.5 fL (ref 80.0–100.0)
Monocytes Absolute: 0.5 10*3/uL (ref 0.1–1.0)
Monocytes Relative: 15 %
NEUTROS PCT: 64 %
NRBC: 0 % (ref 0.0–0.2)
Neutro Abs: 2 10*3/uL (ref 1.7–7.7)
Platelets: 144 10*3/uL — ABNORMAL LOW (ref 150–400)
RBC: 3.67 MIL/uL — AB (ref 4.22–5.81)
RDW: 13.6 % (ref 11.5–15.5)
WBC: 3.2 10*3/uL — ABNORMAL LOW (ref 4.0–10.5)

## 2018-12-07 NOTE — Telephone Encounter (Signed)
Scheduled appt per 3/6 los.  Printed calendar and avs.

## 2018-12-11 ENCOUNTER — Ambulatory Visit (HOSPITAL_COMMUNITY)
Admission: RE | Admit: 2018-12-11 | Discharge: 2018-12-11 | Disposition: A | Discharge status: Home / Self Care | Payer: Medicare Other | Source: Ambulatory Visit | Attending: Hematology | Admitting: Hematology

## 2018-12-11 ENCOUNTER — Other Ambulatory Visit (HOSPITAL_COMMUNITY): Payer: Self-pay | Admitting: Hematology

## 2018-12-11 ENCOUNTER — Inpatient Hospital Stay: Payer: Medicare Other

## 2018-12-11 DIAGNOSIS — C7A029 Malignant carcinoid tumor of the large intestine, unspecified portion: Secondary | ICD-10-CM | POA: Diagnosis not present

## 2018-12-11 DIAGNOSIS — C7A8 Other malignant neuroendocrine tumors: Secondary | ICD-10-CM

## 2018-12-11 DIAGNOSIS — C7B8 Other secondary neuroendocrine tumors: Secondary | ICD-10-CM | POA: Insufficient documentation

## 2018-12-11 DIAGNOSIS — D500 Iron deficiency anemia secondary to blood loss (chronic): Secondary | ICD-10-CM

## 2018-12-11 LAB — CBC WITH DIFFERENTIAL/PLATELET
Abs Immature Granulocytes: 0 10*3/uL (ref 0.00–0.07)
Basophils Absolute: 0 10*3/uL (ref 0.0–0.1)
Basophils Relative: 1 %
Eosinophils Absolute: 0.1 10*3/uL (ref 0.0–0.5)
Eosinophils Relative: 3 %
HEMATOCRIT: 36.2 % — AB (ref 39.0–52.0)
HEMOGLOBIN: 11.7 g/dL — AB (ref 13.0–17.0)
Immature Granulocytes: 0 %
LYMPHS PCT: 12 %
Lymphs Abs: 0.4 10*3/uL — ABNORMAL LOW (ref 0.7–4.0)
MCH: 32.4 pg (ref 26.0–34.0)
MCHC: 32.3 g/dL (ref 30.0–36.0)
MCV: 100.3 fL — ABNORMAL HIGH (ref 80.0–100.0)
MONOS PCT: 17 %
Monocytes Absolute: 0.6 10*3/uL (ref 0.1–1.0)
Neutro Abs: 2.4 10*3/uL (ref 1.7–7.7)
Neutrophils Relative %: 67 %
Platelets: 143 10*3/uL — ABNORMAL LOW (ref 150–400)
RBC: 3.61 MIL/uL — ABNORMAL LOW (ref 4.22–5.81)
RDW: 13.7 % (ref 11.5–15.5)
WBC: 3.5 10*3/uL — ABNORMAL LOW (ref 4.0–10.5)
nRBC: 0 % (ref 0.0–0.2)

## 2018-12-11 LAB — BASIC METABOLIC PANEL
Anion gap: 5 (ref 5–15)
BUN: 22 mg/dL (ref 8–23)
CO2: 29 mmol/L (ref 22–32)
Calcium: 8.6 mg/dL — ABNORMAL LOW (ref 8.9–10.3)
Chloride: 105 mmol/L (ref 98–111)
Creatinine, Ser: 0.87 mg/dL (ref 0.61–1.24)
GFR calc Af Amer: >60 mL/min (ref >60)
GFR calc non Af Amer: >60 mL/min (ref >60)
GLUCOSE: 107 mg/dL — AB (ref 70–99)
Potassium: 3.5 mmol/L (ref 3.5–5.1)
Sodium: 139 mmol/L (ref 135–145)

## 2018-12-11 LAB — CHROMOGRANIN A: Chromogranin A (ng/mL): 389.3 ng/mL — ABNORMAL HIGH (ref 0.0–101.8)

## 2018-12-11 MED ORDER — OCTREOTIDE ACETATE 30 MG IM KIT
30.0000 mg | PACK | Freq: Once | INTRAMUSCULAR | Status: AC
  Administered 2018-12-11: 30 mg via INTRAMUSCULAR

## 2018-12-11 MED ORDER — OCTREOTIDE ACETATE 30 MG IM KIT
PACK | INTRAMUSCULAR | Status: AC
  Filled 2018-12-11: qty 1

## 2018-12-11 MED ORDER — OCTREOTIDE ACETATE 500 MCG/ML IJ SOLN: 500.0000 ug | Freq: Once | INTRAMUSCULAR | Status: DC | PRN

## 2018-12-11 MED ORDER — ONDANSETRON HCL 8 MG PO TABS: 8.0000 mg | ORAL_TABLET | Freq: Two times a day (BID) | ORAL | 0 refills | Status: DC | PRN

## 2018-12-11 MED ORDER — SODIUM CHLORIDE 0.9 % IV SOLN: 500.0000 mL | Freq: Once | INTRAVENOUS | Status: DC

## 2018-12-11 MED ORDER — PROCHLORPERAZINE EDISYLATE 10 MG/2ML IJ SOLN
10.0000 mg | Freq: Four times a day (QID) | INTRAMUSCULAR | Status: DC | PRN
  Filled 2018-12-11: qty 2

## 2018-12-11 MED ORDER — AMINO ACID RADIOPROTECTANT - L-LYSINE 2.5%/L-ARGININE 2.5% IN NS
250.0000 mL/h | INTRAVENOUS | Status: AC
  Administered 2018-12-11: 250 mL/h via INTRAVENOUS

## 2018-12-11 MED ORDER — LUTETIUM LU 177 DOTATATE 370 MBQ/ML IV SOLN
200.0000 | Freq: Once | INTRAVENOUS | Status: AC
  Administered 2018-12-11: 204.2 via INTRAVENOUS

## 2018-12-11 MED ORDER — SODIUM CHLORIDE 0.9 % IV SOLN
8.0000 mg | Freq: Once | INTRAVENOUS | Status: AC
  Administered 2018-12-11: 8 mg via INTRAVENOUS
  Filled 2018-12-11: qty 4

## 2018-12-11 MED ORDER — OCTREOTIDE ACETATE 500 MCG/ML IJ SOLN
INTRAMUSCULAR | Status: AC
  Filled 2018-12-11: qty 1

## 2018-12-11 NOTE — Progress Notes (Signed)
CLINICAL DATA: [age]] year-old [male] with metastatic neuroendocrine tumor. Well differentiated tumor with somatostatin receptor is identified within the [facility] central mesentery, liver, and skeleton] by DOTATATE PET CT scan. EXAM: NUCLEAR MEDICINE LUTATHERA ADMINISTRATION TECHNIQUE: Infusion: The nuclear medicine technologist  verified the dose activity ([206] mCi) to be delivered as specified in the written directive (200 mCi), and verified the patient identification via 2 separate methods. 20 gauge IV were started in the antecubital veins. Anti-emetics were administered by nursing staff. Amino acid renal protection was initiated 30 minutes prior to Lu 177 DOTATATE (Lutathera) infusion and continued continuously for 4 hours. Lutathera infusion was administered over 30 minutes.   The total administered dose was [204.2] mCi Lu 177 DOTATATE.  The entire IV tubing, venocatheter, stopcock and syringes was removed in total, placed in a disposal bag and sent for assay of the residual activity, which will be reported at a later time in our EMR by the physics staff. Pressure was applied to the venipuncture sites, and a compression bandage placed. Radiation Safety personnel were present to perform the discharge survey, as detailed on their documentation.  Patient received 30 mg IM long-acting Sandostatin injection 4 hours after Lutathera effusion in the [facility] outpatient oncology center.Marland Kitchen  RADIOPHARMACEUTICALS:  [204.2] mCi Lu 177 DOTATATE  FINDINGS: Diagnosis: [Metastatic neuroendocrine tumor.]  Current Infusion: [3]  Planned Infusions: [4]  Patient reports [minimal] interval symptoms following therapy.  Patient did experience several days of fatigue.  Patient reports subjective improvement in bone pain in the neck and decreased diarrhea.  The patient's most recent blood counts were reviewed and remains a good candidate to proceed with Lutathera.  Patient does have mild  thrombocytopenia (platelets = 143 K) and leukopenia (white blood cells equal 3.5 K).  Levels of marrow suppression are minimal and full-dose therapy prescribed.   The patient was situated in an infusion suite and administered Lutathera as above. Patient will follow-up with referring oncologist for interval serum laboratories (CBC and CMP) in approximately 4 weeks.   Patient received 30 mg IM long-acting Sandostatin injection 4 hours after Lutathera effusion in the nuclear medicine department.   IMPRESSION: [Third] YM [id number] treatment for metastatic neuroendocrine tumor. The patient tolerated the infusion well. The patient will return in 8 weeks for ongoing care.

## 2018-12-11 NOTE — Patient Instructions (Signed)

## 2018-12-14 LAB — 5 HIAA, QUANTITATIVE, URINE, 24 HOUR
5 HIAA UR: 7.6 mg/L
5-HIAA,Quant.,24 Hr Urine: 15.6 mg/24 hr — ABNORMAL HIGH (ref 0.0–14.9)
Total Volume: 2050

## 2019-01-29 ENCOUNTER — Telehealth: Payer: Self-pay | Admitting: *Deleted

## 2019-01-29 ENCOUNTER — Inpatient Hospital Stay: Payer: Medicare Other | Attending: Hematology

## 2019-01-29 ENCOUNTER — Other Ambulatory Visit: Payer: Self-pay

## 2019-01-29 DIAGNOSIS — C7A029 Malignant carcinoid tumor of the large intestine, unspecified portion: Secondary | ICD-10-CM | POA: Diagnosis present

## 2019-01-29 LAB — CBC WITH DIFFERENTIAL/PLATELET
Abs Immature Granulocytes: 0.01 10*3/uL (ref 0.00–0.07)
Basophils Absolute: 0 10*3/uL (ref 0.0–0.1)
Basophils Relative: 1 %
Eosinophils Absolute: 0.1 10*3/uL (ref 0.0–0.5)
Eosinophils Relative: 3 %
HCT: 33.3 % — ABNORMAL LOW (ref 39.0–52.0)
Hemoglobin: 11.3 g/dL — ABNORMAL LOW (ref 13.0–17.0)
Immature Granulocytes: 0 %
Lymphocytes Relative: 15 %
Lymphs Abs: 0.5 10*3/uL — ABNORMAL LOW (ref 0.7–4.0)
MCH: 33.9 pg (ref 26.0–34.0)
MCHC: 33.9 g/dL (ref 30.0–36.0)
MCV: 100 fL (ref 80.0–100.0)
Monocytes Absolute: 0.6 10*3/uL (ref 0.1–1.0)
Monocytes Relative: 16 %
Neutro Abs: 2.2 10*3/uL (ref 1.7–7.7)
Neutrophils Relative %: 65 %
Platelets: 117 10*3/uL — ABNORMAL LOW (ref 150–400)
RBC: 3.33 MIL/uL — ABNORMAL LOW (ref 4.22–5.81)
RDW: 13.8 % (ref 11.5–15.5)
WBC: 3.4 10*3/uL — ABNORMAL LOW (ref 4.0–10.5)
nRBC: 0 % (ref 0.0–0.2)

## 2019-01-29 LAB — COMPREHENSIVE METABOLIC PANEL
ALT: 13 U/L (ref 0–44)
AST: 18 U/L (ref 15–41)
Albumin: 3.3 g/dL — ABNORMAL LOW (ref 3.5–5.0)
Alkaline Phosphatase: 78 U/L (ref 38–126)
Anion gap: 10 (ref 5–15)
BUN: 18 mg/dL (ref 8–23)
CO2: 25 mmol/L (ref 22–32)
Calcium: 8.6 mg/dL — ABNORMAL LOW (ref 8.9–10.3)
Chloride: 107 mmol/L (ref 98–111)
Creatinine, Ser: 0.91 mg/dL (ref 0.61–1.24)
GFR calc Af Amer: >60 mL/min (ref >60)
GFR calc non Af Amer: >60 mL/min (ref >60)
Glucose, Bld: 100 mg/dL — ABNORMAL HIGH (ref 70–99)
Potassium: 4 mmol/L (ref 3.5–5.1)
Sodium: 142 mmol/L (ref 135–145)
Total Bilirubin: 0.3 mg/dL (ref 0.3–1.2)
Total Protein: 6.1 g/dL — ABNORMAL LOW (ref 6.5–8.1)

## 2019-01-29 NOTE — Telephone Encounter (Signed)
-----   Message from [name], MD sent at 01/29/2019  3:39 PM EDT ----- Benjamine Mola, please let him know the lab results, mild cytopenia secondary to his Lutathera therapy, I will copy Dr. Leonia Reeves. Thanks   [name]  01/29/2019

## 2019-01-29 NOTE — Telephone Encounter (Signed)
TCT patient and spoke with regarding lab results. Reviewed with patient and he voiced understanding. No further questions or concerns. Not experiencing any specific side effects from [place] or from cytopenia. His next lutathera treatment is on 02/05/19

## 2019-02-05 ENCOUNTER — Other Ambulatory Visit: Payer: Self-pay

## 2019-02-05 ENCOUNTER — Inpatient Hospital Stay: Payer: Medicare Other | Attending: Hematology

## 2019-02-05 ENCOUNTER — Ambulatory Visit (HOSPITAL_COMMUNITY)
Admission: RE | Admit: 2019-02-05 | Discharge: 2019-02-05 | Disposition: A | Discharge status: Home / Self Care | Payer: Medicare Other | Source: Ambulatory Visit | Attending: Hematology | Admitting: Hematology

## 2019-02-05 DIAGNOSIS — C7B09 Secondary carcinoid tumors of other sites: Secondary | ICD-10-CM | POA: Diagnosis not present

## 2019-02-05 DIAGNOSIS — C7A029 Malignant carcinoid tumor of the large intestine, unspecified portion: Secondary | ICD-10-CM | POA: Diagnosis not present

## 2019-02-05 DIAGNOSIS — D500 Iron deficiency anemia secondary to blood loss (chronic): Secondary | ICD-10-CM

## 2019-02-05 DIAGNOSIS — C7B02 Secondary carcinoid tumors of liver: Secondary | ICD-10-CM | POA: Insufficient documentation

## 2019-02-05 DIAGNOSIS — C7A8 Other malignant neuroendocrine tumors: Secondary | ICD-10-CM | POA: Diagnosis not present

## 2019-02-05 DIAGNOSIS — C7B8 Other secondary neuroendocrine tumors: Secondary | ICD-10-CM | POA: Diagnosis present

## 2019-02-05 LAB — CBC WITH DIFFERENTIAL/PLATELET
Abs Immature Granulocytes: 0 10*3/uL (ref 0.00–0.07)
Basophils Absolute: 0 10*3/uL (ref 0.0–0.1)
Basophils Relative: 1 %
Eosinophils Absolute: 0.1 10*3/uL (ref 0.0–0.5)
Eosinophils Relative: 3 %
HCT: 32.4 % — ABNORMAL LOW (ref 39.0–52.0)
Hemoglobin: 11.2 g/dL — ABNORMAL LOW (ref 13.0–17.0)
Immature Granulocytes: 0 %
Lymphocytes Relative: 15 %
Lymphs Abs: 0.5 10*3/uL — ABNORMAL LOW (ref 0.7–4.0)
MCH: 35 pg — ABNORMAL HIGH (ref 26.0–34.0)
MCHC: 34.6 g/dL (ref 30.0–36.0)
MCV: 101.3 fL — ABNORMAL HIGH (ref 80.0–100.0)
Monocytes Absolute: 0.6 10*3/uL (ref 0.1–1.0)
Monocytes Relative: 18 %
Neutro Abs: 2 10*3/uL (ref 1.7–7.7)
Neutrophils Relative %: 63 %
Platelets: 136 10*3/uL — ABNORMAL LOW (ref 150–400)
RBC: 3.2 MIL/uL — ABNORMAL LOW (ref 4.22–5.81)
RDW: 13.8 % (ref 11.5–15.5)
WBC: 3.1 10*3/uL — ABNORMAL LOW (ref 4.0–10.5)
nRBC: 0 % (ref 0.0–0.2)

## 2019-02-05 MED ORDER — AMINO ACID RADIOPROTECTANT - L-LYSINE 2.5%/L-ARGININE 2.5% IN NS
250.0000 mL/h | INTRAVENOUS | Status: AC
  Administered 2019-02-05: 250 mL/h via INTRAVENOUS
  Filled 2019-02-05: qty 1000

## 2019-02-05 MED ORDER — SODIUM CHLORIDE 0.9 % IV SOLN
8.0000 mg | Freq: Once | INTRAVENOUS | Status: AC
  Administered 2019-02-05: 8 mg via INTRAVENOUS
  Filled 2019-02-05: qty 4

## 2019-02-05 MED ORDER — OCTREOTIDE ACETATE 500 MCG/ML IJ SOLN: 500.0000 ug | Freq: Once | INTRAMUSCULAR | Status: DC | PRN

## 2019-02-05 MED ORDER — OCTREOTIDE ACETATE 500 MCG/ML IJ SOLN
INTRAMUSCULAR | Status: AC
  Filled 2019-02-05: qty 1

## 2019-02-05 MED ORDER — LUTETIUM LU 177 DOTATATE 370 MBQ/ML IV SOLN
200.0000 | Freq: Once | INTRAVENOUS | Status: AC
  Administered 2019-02-05: 10:00:00 203.9 via INTRAVENOUS

## 2019-02-05 MED ORDER — OCTREOTIDE ACETATE 30 MG IM KIT
30.0000 mg | PACK | Freq: Once | INTRAMUSCULAR | Status: AC
  Administered 2019-02-05: 30 mg via INTRAMUSCULAR

## 2019-02-05 MED ORDER — PROCHLORPERAZINE EDISYLATE 10 MG/2ML IJ SOLN
10.0000 mg | Freq: Four times a day (QID) | INTRAMUSCULAR | Status: DC | PRN
  Filled 2019-02-05: qty 2

## 2019-02-05 MED ORDER — ONDANSETRON HCL 8 MG PO TABS: 8.0000 mg | ORAL_TABLET | Freq: Two times a day (BID) | ORAL | 0 refills | Status: DC | PRN

## 2019-02-05 MED ORDER — SODIUM CHLORIDE 0.9 % IV SOLN: 500.0000 mL | Freq: Once | INTRAVENOUS | Status: DC

## 2019-02-05 MED ORDER — OCTREOTIDE ACETATE 30 MG IM KIT
PACK | INTRAMUSCULAR | Status: AC
  Filled 2019-02-05: qty 1

## 2019-02-05 NOTE — Progress Notes (Signed)
CLINICAL DATA: [age]] year-old [male] with metastatic neuroendocrine tumor. Well differentiated tumor with somatostatin receptor is identified within the [liver, mesentery, and bones] by DOTATATE PET CT scan. EXAM: NUCLEAR MEDICINE LUTATHERA ADMINISTRATION TECHNIQUE: Infusion: The nuclear medicine technologist and I personally verified the dose activity ([205] mCi) to be delivered as specified in the written directive (200 mCi), and verified the patient identification via 2 separate methods. 20 gauge IV were started in the antecubital veins. Anti-emetics were administered by nursing staff. Amino acid renal protection was initiated 30 minutes prior to Lu 177 DOTATATE (Lutathera) infusion and continued continuously for 4 hours. Lutathera infusion was administered over 30 minutes.   The total administered dose was [203.9] mCi Lu 177 DOTATATE.  The entire IV tubing, venocatheter, stopcock and syringes was removed in total, placed in a disposal bag and sent for assay of the residual activity, which will be reported at a later time in our EMR by the physics staff. Pressure was applied to the venipuncture sites, and a compression bandage placed. Radiation Safety personnel were present to perform the discharge survey, as detailed on their documentation.  Patient received 30 mg IM long-acting Sandostatin injection 4 hours after Lutathera effusion in the nuclear medicine department.  RADIOPHARMACEUTICALS:  []  mCi Lu 177 DOTATATE  FINDINGS: Diagnosis: [Metastatic neuroendocrine tumor.]  Current Infusion: [4]  Planned Infusions: [4]  Patient reports [minimal] interval symptoms following therapy.  Patient reports improved fatigue.  The patient's most recent blood counts were reviewed and remains a good candidate to proceed with Lutathera.  Improved thrombocytopenia (platelets equal 134,000).  Mild leukopenia persists.  The patient was situated in an infusion suite and administered  Lutathera as above. Patient will follow-up with referring oncologist for interval serum laboratories (CBC and CMP) in approximately 4 weeks.    Patient received 30 mg IM long-acting Sandostatin injection 4 hours after Lutathera effusion in the oncology outpatient department.   IMPRESSION: [Fourth and final] DH [id number] treatment for metastatic neuroendocrine tumor. The patient tolerated the infusion well. The patient will return in 4 to 6 weeks for  post therapy gallium 68 DOTATATE PET scan and post treatment consultation.

## 2019-02-05 NOTE — Progress Notes (Signed)
Dr. Leonia Reeves made aware of BP.  No new orders.  Will continue to monitor pt's BP.

## 2019-02-05 NOTE — Patient Instructions (Signed)

## 2019-03-08 ENCOUNTER — Telehealth: Payer: Self-pay | Admitting: Hematology

## 2019-03-08 ENCOUNTER — Inpatient Hospital Stay: Payer: Medicare Other

## 2019-03-08 ENCOUNTER — Other Ambulatory Visit: Payer: Self-pay | Admitting: Hematology

## 2019-03-08 ENCOUNTER — Inpatient Hospital Stay: Payer: Medicare Other | Admitting: Hematology

## 2019-03-08 MED ORDER — LORAZEPAM 1 MG PO TABS: 1.0000 mg | ORAL_TABLET | Freq: Once | ORAL | 0 refills | Status: DC | PRN

## 2019-03-08 NOTE — Telephone Encounter (Signed)
Scheduled appt per 6/5 sch message - no answer . Left message for patient with appt date and time

## 2019-03-12 ENCOUNTER — Ambulatory Visit (HOSPITAL_COMMUNITY)
Admission: RE | Admit: 2019-03-12 | Discharge: 2019-03-12 | Disposition: A | Discharge status: Home / Self Care | Payer: Medicare Other | Source: Ambulatory Visit | Attending: Hematology | Admitting: Hematology

## 2019-03-12 ENCOUNTER — Other Ambulatory Visit: Payer: Self-pay

## 2019-03-12 DIAGNOSIS — C7A8 Other malignant neuroendocrine tumors: Secondary | ICD-10-CM

## 2019-03-12 MED ORDER — GALLIUM GA 68 DOTATATE IV KIT
3.8100 | PACK | Freq: Once | INTRAVENOUS | Status: AC
  Administered 2019-03-12: 3.81 via INTRAVENOUS

## 2019-03-12 NOTE — Consult Note (Signed)
Chief Complaint: Patient with metastatic neuroendocrine tumor post completion of 4 cycles Peptide receptor radiotherapy (PRRT) with SE831 DOTATATE (Lutathera).  Referring Physician(s):Feng  Patient Status: [facility] - Out-pt  History of Present Illness: Ryan Cowan is a 73 y.o. male With metastatic well-differentiated neuroendocrine tumor.  Patient was experiencing diarrhea and fatigue and underwent diagnostic CT abdomen pelvis in May 2016 which demonstrated cecal mass and liver metastasis.  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  Presumably of cecal origin.   Patient was initiated on Sandostatin injections 30 mg IM every 4 weeks beginning in June 2016.  Patient is demonstrated recent progression of lymphadenopathy by CT scan.   Patient underwent of gallium 8 DOTATATE PET scan on 10/16 19 which demonstrated intense radiotracer activity associated liver lesions.  Bulky mesenteric mass in the RIGHT lower quadrant.  Multiple small skeletal lesions are demonstrated within the sternum, thoracic spine and pelvis.  All lesions are intensely avid for Sandostatin receptor targeting radiotracer (DOTATATE).   Patient recounts moderate carcinoid symptoms including unpredictable diarrhea and fatigue.  No flushing.   Patient status post 4 cycles of peptide receptor radiotherapy of 517 millicuries Lu  616 DOTATATE.  Last therapy on 02/05/2019.  Patient presents today for consultation and follow-up gallium 68 DOTATATE PET scan.     Past Medical History:  Diagnosis Date   Liver metastases [facility])     Past Surgical History:  Procedure Laterality Date   APPENDECTOMY     KNEE SURGERY     SHOULDER SURGERY      Allergies: Codeine  Medications: Prior to Admission medications   Medication Sig Start Date End Date Taking? Authorizing Provider  LORazepam (ATIVAN) 1 MG tablet Take 1 tablet (1 mg total) by  mouth once as needed for up to 1 dose for anxiety. 03/08/19   [name], MD  Multiple Vitamin (MULTIVITAMIN WITH MINERALS) TABS tablet Take 1 tablet by mouth daily. Centrum Silver 50 plus    [provider]  naproxen sodium (ANAPROX) 220 MG tablet Take 220 mg by mouth 2 (two) times daily as needed (pain).    [provider]  ondansetron (ZOFRAN) 8 MG tablet Take 1 tablet (8 mg total) by mouth 2 (two) times daily as needed for nausea or vomiting. 10/16/18   Gus Height, MD  ondansetron (ZOFRAN) 8 MG tablet Take 1 tablet (8 mg total) by mouth 2 (two) times daily as needed for nausea or vomiting. 12/11/18   Gus Height, MD  ondansetron (ZOFRAN) 8 MG tablet Take 1 tablet (8 mg total) by mouth 2 (two) times daily as needed for nausea or vomiting. 02/05/19   Gus Height, MD  OVER THE COUNTER MEDICATION Place 1 application into both nostrils daily as needed (congestion). Vicks inhaler stick    [provider]     Family History  Problem Relation Age of Onset   CAD Mother    CAD Father    Hypertension Father    Hypertension Sister     Social History   Socioeconomic History   Marital status: Single    Spouse name: Not on file   Number of children: Not on file   Years of education: Not on file   Highest education level: Not on file  Occupational History   Not on file  Social Needs   Financial resource strain: Not on file   Food insecurity:    Worry: Not on file    Inability:  Not on file   Transportation needs:    Medical: Not on file    Non-medical: Not on file  Tobacco Use   Smoking status: Former Smoker    Packs/day: 2.50    Years: 50.00    Pack years: 125.00    Last attempt to quit: 10/04/2011    Years since quitting: 7.4   Smokeless tobacco: Never Used  Substance and Sexual Activity   Alcohol use: No   Drug use: Not on file   Sexual activity: Not on file  Lifestyle   Physical activity:    Days per week: Not on file    Minutes per session:  Not on file   Stress: Not on file  Relationships   Social connections:    Talks on phone: Not on file    Gets together: Not on file    Attends religious service: Not on file    Active member of club or organization: Not on file    Attends meetings of clubs or organizations: Not on file    Relationship status: Not on file  Other Topics Concern   Not on file  Social History Narrative   Lives alone in his mother's home (in SNF)   Has #3 sisters, [name] is POA   Prior employed in [employer] and was in [employer] 4 years   Very detail oriented in conversation    ECOG Status: 1 - Symptomatic but completely ambulatory  Review of Systems: A 12 point ROS discussed and pertinent positives are indicated in the HPI above.  All other systems are negative.  Review of Systems  Constitutional: Positive for fatigue. Negative for appetite change.  Skin: Positive for color change.       bruising  on forearm  Neurological: Positive for weakness.    Vital Signs: There were no vitals taken for this visit.  Physical Exam   Deferred for COVID precautions  Imaging: Nm Pet (netspot Ga 68 Dotatate) Skull Base To Mid Thigh  Result Date: 03/12/2019 CLINICAL DATA:  PATIENT STATUS POST 4 CYCLES Lu 177 DOTATATE FOR TREATMENT of well differentiated neuroendocrine tumor. Last treatment 02/05/2019. EXAM: NUCLEAR MEDICINE PET SKULL BASE TO THIGH TECHNIQUE: 3.93 mCi Ga 9 DOTATATE was injected intravenously. Full-ring PET imaging was performed from the skull base to thigh after the radiotracer. CT data was obtained and used for attenuation correction and anatomic localization. Mediastinal blood pool activity with SUV max equal 0.8 compared to 0.9 on comparison exam. Background liver activity equals 7.2 compared SUV max equal 4.2. COMPARISON:  DOTATATE PET scan 07/18/2018 FINDINGS: NECK No radiotracer activity in neck lymph nodes. Incidental CT findings: None CHEST Within the anterior mediastinum  small prevascular lymph node measuring 6 mm is decreased from 12 mm and has decreased radiotracer activity with SUV max equal 8.9 decreased from 43. No suspicious pulmonary nodules. Incidental CT finding:Ascending thoracic aorta is upper limits of normal at 40 mm. ABDOMEN/PELVIS Dramatic decrease in radiotracer activity within liver metastasis. Example lesion in the anterior aspect LEFT lateral hepatic lobe with SUV max equal 21 decreased SUV max equal 41. This lesion is also measurably decreased in size measuring 23 mm compared to 42 mm. Lesion in the subcapsular central liver with SUV max equal 18 decreased from SUV max equal 57. This lymph node measures 13 mm compared to 30 mm. Marked reduction in size and metabolic activity of retroperitoneal lymph nodes at the level of the upper abdomen with SUV max equal 11.2 decreased from 50.2.  Lymph nodes are also decreased in size. The large mass in the RIGHT lower quadrant extending towards the central abdomen measures 39 mm by 27 mm in axial dimension decreased from 57 mm by 37 mm. Lesion is decreased significant radiotracer activity SUV max equal 22.7 decreased from 44.3. No new sites of metastatic disease in the abdomen pelvis. Previous seen small peritoneal implants in the deep pelvis are no longer confidently identified. Small Physiologic activity noted in the liver, spleen, adrenal glands and kidneys. Incidental CT findings:Small effusion in the RIGHT pelvis decreased in volume compared to prior. SKELETON Multiple sites of metastatic activity within the skeleton are again demonstrated but much reduced from prior. Mid knee lesions are not readily identifiable on current exam. For example lesion in the T8 vertebral body with SUV max equal 16 decreased from SUV max equal 50. Lesion in the L5 vertebral body is no longer identifiable. Lesion in the sternum with SUV max equal 20.3 decreased from SUV max equal 25. Incidental CT findings:None IMPRESSION: 1. Marked interval  positive response to peptide receptor radiotherapy. 2. Significant decrease in size and radiotracer activity within liver metastasis. 3. Significant decrease in size and radiotracer activity of large peritoneal mass in RIGHT lower quadrant. 4. Decrease in size and radiotracer activity of retroperitoneal adenopathy in the upper abdomen. 5. Decrease in size and radiotracer activity or resolution of activity of the small skeletal metastasis. Electronically Signed   By: Suzy Bouchard M.D.   On: 03/12/2019 13:37   Nm Pet (netspot Ga 68 Dotatate) Skull Base To Mid Thigh  Result Date: 07/18/2018 CLINICAL DATA:  Subsequent treatment strategy for metastatic neuroendocrine tumor of GI tract. EXAM: NUCLEAR MEDICINE PET SKULL BASE TO THIGH TECHNIQUE: 3.8 mCi Ga 58 DOTATATE was injected intravenously. Full-ring PET imaging was performed from the skull base to thigh after the radiotracer. CT data was obtained and used for attenuation correction and anatomic localization. COMPARISON:  CT chest 12/07/2017 and CT AP 06/12/2018 FINDINGS: NECK No radiotracer activity in neck lymph nodes. Incidental CT findings: None CHEST Small right supraclavicular lymph node is radiotracer avid within SUV max of 4.09. Lymph node within the anterior mediastinum measures 1.2 cm and has an SUV max of 42.9, image 67/4. Previously this measured 1.1 cm. No radiotracer avid pulmonary nodules identified. Incidental CT finding: Aortic atherosclerosis. Ascending thoracic aortic aneurysm is again noted measuring 4.5 cm, image number 87/4. Calcification in the LAD coronary artery noted. ABDOMEN/PELVIS Extensive radiotracer avid liver metastases identified. Index lesion within posterior right lobe measures 4.1 cm 35.7, image 112/4. On 06/12/2018 this measured the same. Left lobe of liver metastasis measures 4.2 cm and has an SUV max 41.13. On 06/12/2018 this measured the same. The index lesion within the lateral segment of left lobe measures 3.9 cm  within SUV max of 43.94, image 129/4. On 06/12/2018 this measured 4.1 cm. The central mesenteric mass has a cranial caudal dimension of 11.9 cm within SUV max of 44.32. Previously this was measured at 10 cm. Adjacent right lower quadrant small bowel lesion measures 5.4 cm within SUV max of 40.52. Multiple hypermetabolic retroperitoneal lymph nodes are identified. Index aortocaval node measures 1.5 cm and has an SUV max of 50.15. Physiologic activity noted in the liver, spleen, adrenal glands and kidneys. Incidental CT findings:There is aortic atherosclerosis. Nonobstructing left renal calculi noted. Left kidney cyst. A small volume of ascites is noted, similar to previous exam. SKELETON Extensive multifocal radiotracer avid bone metastases identified. Small lesion within the right side of  the hyoid bone measures approximately 9 mm and has an SUV max of 10.03. peripherally sclerotic lucent lesion within the sternum measures 1.4 cm within SUV max of 25. Unchanged in size from 12/07/2017. Multiple lesions are identified within the thoracic and lumbar spine. Index lesion within the T7 vertebra with subtle corresponding area of increased sclerosis measures approximately 1.3 cm and has an SUV max of 50.41. Lesion within the L4 vertebra without definite CT correlate has an SUV max of 11.37. Incidental CT findings:None IMPRESSION: 1. Examination is positive for extensive neuroendocrine tumor receptor avid metastatic disease involving the liver, axial and appendicular skeleton, thoracic and abdominal lymph nodes. There is a dominant mass within the mesentery which is intensely radiotracer avid with a maximum dimension of 11.9 cm and an SUV max of 44.32. Within the distal small bowel loops there is a radiotracer avid lesion likely within the distal ileum. 2. When compared with CT from 06/13/2018 the tumor volume within the abdomen is not significantly changed in the interval. 3.  Aortic Atherosclerosis (ICD10-I70.0). 4.  Coronary artery atherosclerotic calcifications 5. Nonobstructing left renal calculi Electronically Signed   By: Kerby Moors M.D.   On: 07/18/2018 09:44   DOTATATE PET 03/12/19  IMPRESSION: 1. Marked interval positive response to peptide receptor radiotherapy. 2. Significant decrease in size and radiotracer activity within liver metastasis. 3. Significant decrease in size and radiotracer activity of large peritoneal mass in RIGHT lower quadrant. 4. Decrease in size and radiotracer activity of retroperitoneal adenopathy in the upper abdomen. 5. Decrease in size and radiotracer activity or resolution of activity of the small skeletal metastasis.      Labs:  CBC: Recent Labs    12/07/18 1312 12/11/18 0807 01/29/19 1040 02/05/19 0838  WBC 3.2* 3.5* 3.4* 3.1*  HGB 12.0* 11.7* 11.3* 11.2*  HCT 35.4* 36.2* 33.3* 32.4*  PLT 144* 143* 117* 136*    COAGS: No results for input(s): INR, APTT in the last 8760 hours.  BMP: Recent Labs    10/16/18 0828 12/07/18 1312 12/11/18 0807 01/29/19 1040  NA 140 139 139 142  K 3.8 4.0 3.5 4.0  CL 104 106 105 107  CO2 29 27 29 25   GLUCOSE 85 96 107* 100*  BUN 16 26* 22 18  CALCIUM 9.0 8.9 8.6* 8.6*  CREATININE 0.75 0.87 0.87 0.91  GFRNONAA >60 >60 >60 >60  GFRAA >60 >60 >60 >60    LIVER FUNCTION TESTS: Recent Labs    09/06/18 1315 10/16/18 0828 12/07/18 1312 01/29/19 1040  BILITOT 0.3 0.8 0.5 0.3  AST 15 21 20 18   ALT 9 14 14 13   ALKPHOS 78 76 76 78  PROT 6.0* 6.5 6.1* 6.1*  ALBUMIN 3.2* 3.9 3.8 3.3*    TUMOR MARKERS: Chromogranin A = 389 on 12/07/19   Assessment and Plan:  1. Patient status post peptide receptor radiotherapy with last therapy 02/05/2019.  Patient demonstrated a positive response to therapy with reduction in radiotracer activity on gallium 68 DOTATATE PET scan (03/12/2019).  Reduction in size and activity of liver lesions, retroperitoneal adenopathy, mesenteric mass, and skeletal metastasis.  2. Patient  completed the six-month therapy with minimal adverse effects.  Patient experienced mild thrombocytopenia and mild leukopenia. Normal liver function and renal function.  3. Patient has minimum carcinoid symptoms. Recommend following chromogranin A and 5HT.  Chromogranin A was recently elevated which does not correspond with positive response on DOTATATE PET scan.  4. Recommend continued  Sandostatin LAR injections.  ]   Thank  you for this interesting consult.  I greatly enjoyed meeting Nasif P Steege and look forward to participating in their care.  A copy of this report was sent to the requesting provider on this date.  Electronically Signed: Rennis Golden, MD 03/12/2019, 3:16 PM   I spent a total of    25 Minutes in face to face in clinical consultation, greater than 50% of which was counseling/coordinating care for metastatic neuroendocrine tumor.

## 2019-03-21 NOTE — Progress Notes (Signed)
[place]   [phone] [phone]   Clinic Follow up Note   Patient Care Team: Leonard Downing, MD as PCP - General (Family Medicine)  Date of Service:  03/22/2019  CHIEF COMPLAINT: F/u of Metastatic Carcinoid cancer  SUMMARY OF ONCOLOGIC HISTORY: Oncology History Overview Note  Carcinoid tumor of colon, malignant   Staging form: Colon and Rectum, AJCC 7th Edition     Clinical: Stage Unknown ([place], N2b, M1) - Unsigned     Carcinoid tumor of colon, malignant ([place])  02/16/2015 Imaging   CT abdomen and pelvis with contrast showed wall thickening of the cecum and terminal ileum high suspicious for cecal neoplasm. Extensive confluent ileal colic adenopathy extending chronically along the superior mesenteric vein, multiple hepatic metastasis   02/18/2015 Initial Diagnosis   Carcinoid tumor of colon, malignant, with liver metastasis   02/18/2015 Initial Biopsy   Liver biopsy showed metastatic low-grade neuroendocrine tumor (carcinoid). The tumor is positive for CD X2, CD 56, chromogranin, and synaptophysin.   03/05/2015 - 07/24/2018 Chemotherapy   Sandostatin 30 mg IM every 4 weeks. Due to disease progression regular monthly injections stopped after 07/24/18   03/06/2015 Imaging   CT chest showed no evidence of metastasis in the chest. 4.4 cm ascending aortic aneurysm.   09/09/2015 Imaging   Restaging CT abdomen and pelvis with contrast showed interval response of hepatic metastasis. Most are small and, some are stable. Slight interval decrease in mesenteric adenopathy and primary colon tumor.    12/06/2016 Imaging   CT CAP w contrast IMPRESSION: 1. Overall, the extent of metastatic disease to the liver and abdominal/retroperitoneal lymphatics is similar to the prior examination 08/19/2016. 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 2. Multiple small pulmonary  nodules appear unchanged in size, number and distribution, favored to be benign. 3. Aortic atherosclerosis, in addition to 2 vessel coronary artery disease. Assessment for potential risk factor modification, dietary therapy or pharmacologic therapy may be warranted, if clinically indicated. 4. In addition, there is dilatation of the aortic root (4.7 cm in diameter at the level of the sinuses of Valsalva) and ectasia of the ascending thoracic aorta measuring 4.4 cm in diameter. Recommend annual imaging followup by CTA or MRA. This recommendation follows 2010 ACCF/AHA/AATS/ACR/ASA/SCA/SCAI/SIR/STS/SVM Guidelines for the Diagnosis and Management of Patients with Thoracic Aortic Disease. Circulation. 2010; 121: W102-V253. 5. Trace volume of ascites. 6. Additional incidental findings, as above.    05/31/2017 Imaging   CT CAP W Contrast 05/31/17 IMPRESSION: 1. Overall the extent of metastatic disease to the liver and abdominal adenopathy is similar to previous exam from 12/06/2016. 2. 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 3. Stable small pulmonary nodules. 4.  Aortic Atherosclerosis (ICD10-I70.0). 5. Dilatation of the ascending thoracic aorta. Unchanged. Recommend annual imaging followup by CTA or MRA. This recommendation follows 2010 ACCF/AHA/AATS/ACR/ASA/SCA/SCAI/SIR/STS/SVM Guidelines for the Diagnosis and Management of Patients with Thoracic Aortic Disease. Circulation. 2010; 121: e266-e369 6. Trace ascites.   12/07/2017 Imaging   CT CAP W Contrast 12/07/17 IMPRESSION: Chest Impression: 1. No evidence of thoracic metastasis. 2. Stable ascending thoracic aortic aneurysm at 4.4 cm. Recommend attention on oncology surveillance. Abdomen / Pelvis Impression: 1. Mild interval increase in size of multifocal hepatic metastasis. 2. Large central small bowel mesenteric mass is unchanged 3. No bowel obstruction. 4. No  skeletal lesions    06/12/2018 Imaging   06/12/2018  CT AP IMPRESSION: Large mesenteric mass in the right mid abdomen, increased.  Multifocal hepatic metastases, increased.  Mild upper abdominal lymphadenopathy, mildly increased.  Masslike thickening involving the terminal ileum, suspicious for metastasis, similar to the prior. Additional nonspecific wall thickening involving multiple loops of small bowel in the pelvis.  Small volume pelvic ascites, mildly increased.   06/12/2018 Progression   IMPRESSION: Large mesenteric mass in the right mid abdomen, increased.  Multifocal hepatic metastases, increased.  Mild upper abdominal lymphadenopathy, mildly increased.  Masslike thickening involving the terminal ileum, suspicious for metastasis, similar to the prior. Additional nonspecific wall thickening involving multiple loops of small bowel in the pelvis.  Small volume pelvic ascites, mildly increased.   07/18/2018 PET scan   IMPRESSION: 1. Examination is positive for extensive neuroendocrine tumor receptor avid metastatic disease involving the liver, axial and appendicular skeleton, thoracic and abdominal lymph nodes. There is a dominant mass within the mesentery which is intensely radiotracer avid with a maximum dimension of 11.9 cm and an SUV max of 44.32. Within the distal small bowel loops there is a radiotracer avid lesion likely within the distal ileum. 2. When compared with CT from 06/13/2018 the tumor volume within the abdomen is not significantly changed in the interval. 3.  Aortic Atherosclerosis (ICD10-I70.0). 4. Coronary artery atherosclerotic calcifications 5. Nonobstructing left renal calculi   08/21/2018 - 02/05/2019 Chemotherapy   Lutathera treatment with Sandostatin injection 08/21/18, 10/16/18, 12/11/18, 02/05/19   03/12/2019 PET scan   IMPRESSION: 1. Marked interval positive response to peptide receptor radiotherapy. 2. Significant decrease in size and  radiotracer activity within liver metastasis. 3. Significant decrease in size and radiotracer activity of large peritoneal mass in RIGHT lower quadrant. 4. Decrease in size and radiotracer activity of retroperitoneal adenopathy in the upper abdomen. 5. Decrease in size and radiotracer activity or resolution of activity of the small skeletal metastasis.        CURRENT THERAPY:  Observations.   INTERVAL HISTORY:  Ryan Cowan is here for a follow up. He presents to the clinic alone. His sister was called to be included in the visit. He notes his diarrhea is overall resolved but will still have diarrhea based on his diet. He notes eating and appetite is adequate overall. He does not eat full 3 meals but does snack during the day.     REVIEW OF SYSTEMS:   Constitutional: Denies fevers, chills or abnormal weight loss Eyes: Denies blurriness of vision Ears, nose, mouth, throat, and face: Denies mucositis or sore throat Respiratory: Denies cough, dyspnea or wheezes Cardiovascular: Denies palpitation, chest discomfort (+) occasional lower extremity swelling Gastrointestinal:  Denies nausea, heartburn  (+) occasional diarrhea Skin: Denies abnormal skin rashes Lymphatics: Denies new lymphadenopathy or easy bruising Neurological:Denies numbness, tingling or new weaknesses Behavioral/Psych: Mood is stable, no new changes  All other systems were reviewed with the patient and are negative.  MEDICAL HISTORY:  Past Medical History:  Diagnosis Date  . Liver metastases ([place])     SURGICAL HISTORY: Past Surgical History:  Procedure Laterality Date  . APPENDECTOMY    . KNEE SURGERY    . SHOULDER SURGERY      I have reviewed the social history and family history with the patient and they are unchanged from previous note.  ALLERGIES:  is allergic to codeine.  MEDICATIONS:  Current Outpatient Medications  Medication Sig Dispense Refill  . Multiple Vitamin (MULTIVITAMIN WITH MINERALS)  TABS tablet Take 1 tablet by mouth daily. Centrum Silver 50 plus    .  naproxen sodium (ANAPROX) 220 MG tablet Take 220 mg by mouth 2 (two) times daily as needed (pain).    Marland Kitchen OVER THE COUNTER MEDICATION Place 1 application into both nostrils daily as needed (congestion). Vicks inhaler stick     No current facility-administered medications for this visit.     PHYSICAL EXAMINATION: ECOG PERFORMANCE STATUS: 0 - Asymptomatic  Vitals:   03/22/19 1354  BP: (!) 148/92  Pulse: 81  Resp: 20  Temp: 99.6 F (37.6 C)  SpO2: 97%   Filed Weights   03/22/19 1354  Weight: 147 lb 11.2 oz (67 kg)    GENERAL:alert, no distress and comfortable SKIN: skin color, texture, turgor are normal, no rashes or significant lesions EYES: normal, Conjunctiva are pink and non-injected, sclera clear  NECK: supple, thyroid normal size, non-tender, without nodularity LYMPH:  no palpable lymphadenopathy in the cervical, axillary  LUNGS: clear to auscultation and percussion with normal breathing effort HEART: regular rate & rhythm and no murmurs and no lower extremity edema ABDOMEN:abdomen soft, non-tender and normal bowel sounds Musculoskeletal:no cyanosis of digits and no clubbing  NEURO: alert & oriented x 3 with fluent speech, no focal motor/sensory deficits  LABORATORY DATA:  I have reviewed the data as listed CBC Latest Ref Rng & Units 03/22/2019 02/05/2019 01/29/2019  WBC 4.0 - 10.5 K/uL 2.8(L) 3.1(L) 3.4(L)  Hemoglobin 13.0 - 17.0 g/dL 11.1(L) 11.2(L) 11.3(L)  Hematocrit 39.0 - 52.0 % 33.0(L) 32.4(L) 33.3(L)  Platelets 150 - 400 K/uL 89(L) 136(L) 117(L)     CMP Latest Ref Rng & Units 03/22/2019 01/29/2019 12/11/2018  Glucose 70 - 99 mg/dL 96 100(H) 107(H)  BUN 8 - 23 mg/dL 21 18 22   Creatinine 0.61 - 1.24 mg/dL 0.88 0.91 0.87  Sodium 135 - 145 mmol/L 140 142 139  Potassium 3.5 - 5.1 mmol/L 4.1 4.0 3.5  Chloride 98 - 111 mmol/L 107 107 105  CO2 22 - 32 mmol/L 25 25 29   Calcium 8.9 - 10.3 mg/dL 8.7(L)  8.6(L) 8.6(L)  Total Protein 6.5 - 8.1 g/dL 6.1(L) 6.1(L) -  Total Bilirubin 0.3 - 1.2 mg/dL 0.3 0.3 -  Alkaline Phos 38 - 126 U/L 69 78 -  AST 15 - 41 U/L 15 18 -  ALT 0 - 44 U/L 11 13 -      RADIOGRAPHIC STUDIES: I have personally reviewed the radiological images as listed and agreed with the findings in the report. No results found.   ASSESSMENT & PLAN:  Ryan Cowan is a 73 y.o. male with   1. 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 He was first treated with Sandostatin injections since 03/2015. He had disease progression in 06/2018 with bone mets in spine, pelvic bone and sternum. -He has completed 5 treatments of Lutathera with Dr. Leonia Reeves on 08/21/18-02/05/19.  -We discussed his DOTATATE PET scan from 03/12/19 which shows very good response to [place] treatment but not likely cured from this alone. I reviewed his images with him in person.  -Further treatment at this time is not indicated. He is asymptomatic, does not need Sandostatin injections.  I discussed this type of tumor is slow growing. Will proceed with observation with scans every 6-9 months and regular labs and physical exam.  -we discussed that we will start next line therapy if he has disease progression in future -Labs reviewed, CBC and CMP WNL except WBC 2.8, Hg 11.1, PLT 89K, Ca 8.7, protein 6.1. Chromogranin  A still pending.  -I encouraged him to watch for bleeding and bruising of skin.  -Low blood counts are likely related to [place] treatment. If low blood counts do not resolve in 6-12 month, I may recommend bone marrow biopsy to further evaluate for MDS or leukemia which could be related to [place] therapy. . -I encouraged him to take oral Calcium and Vitamin supplements.  -Exam was unremarkable. He is clinically doing well -Temp at 99.6 today, he denies fever prior to visit. He is aymptomatic -F/u in 3 months   2. 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  -Hg 11.1 today (03/22/19)   3. Diarrhea  -secondary to #1 -Mostly resolved now. Watching his diet helps.   4. Possible right inguinal hernia  -Patient complains of having a "knot" in the groin area. -I have previously dicussed with the patient and we have agreed to just continue watching the area as long as no pain is present. -Stable with no pain    Plan -His latest PET scan shows very good response to [place] treatment. Will proceed with observation -Lab and f/u in 3 months    No problem-specific Assessment & Plan notes found for this encounter.   No orders of the defined types were placed in this encounter.  All questions were answered. The patient knows to call the clinic with any problems, questions or concerns. No barriers to learning was detected. I spent 20 minutes counseling the patient face to face. The total time spent in the appointment was 25 minutes and more than 50% was on counseling and review of test results     [name], MD 03/22/2019   I, Joslyn Devon, am acting as scribe for [name], MD.   I have reviewed the above documentation for accuracy and completeness, and I agree with the above.

## 2019-03-22 ENCOUNTER — Inpatient Hospital Stay: Payer: Medicare Other | Attending: Hematology

## 2019-03-22 ENCOUNTER — Encounter: Payer: Self-pay | Admitting: Hematology

## 2019-03-22 ENCOUNTER — Other Ambulatory Visit: Payer: Self-pay

## 2019-03-22 ENCOUNTER — Inpatient Hospital Stay (HOSPITAL_BASED_OUTPATIENT_CLINIC_OR_DEPARTMENT_OTHER): Payer: Medicare Other | Admitting: Hematology

## 2019-03-22 ENCOUNTER — Telehealth: Payer: Self-pay | Admitting: Hematology

## 2019-03-22 VITALS — BP 148/92 | HR 81 | Temp 99.6°F | Resp 20 | Ht 74.0 in | Wt 147.7 lb

## 2019-03-22 DIAGNOSIS — C772 Secondary and unspecified malignant neoplasm of intra-abdominal lymph nodes: Secondary | ICD-10-CM

## 2019-03-22 DIAGNOSIS — C787 Secondary malignant neoplasm of liver and intrahepatic bile duct: Secondary | ICD-10-CM | POA: Insufficient documentation

## 2019-03-22 DIAGNOSIS — C7951 Secondary malignant neoplasm of bone: Secondary | ICD-10-CM | POA: Diagnosis not present

## 2019-03-22 DIAGNOSIS — I2510 Atherosclerotic heart disease of native coronary artery without angina pectoris: Secondary | ICD-10-CM

## 2019-03-22 DIAGNOSIS — C7A029 Malignant carcinoid tumor of the large intestine, unspecified portion: Secondary | ICD-10-CM

## 2019-03-22 DIAGNOSIS — D500 Iron deficiency anemia secondary to blood loss (chronic): Secondary | ICD-10-CM

## 2019-03-22 DIAGNOSIS — R188 Other ascites: Secondary | ICD-10-CM

## 2019-03-22 DIAGNOSIS — I700 Atherosclerosis of aorta: Secondary | ICD-10-CM

## 2019-03-22 DIAGNOSIS — Z885 Allergy status to narcotic agent status: Secondary | ICD-10-CM

## 2019-03-22 DIAGNOSIS — I712 Thoracic aortic aneurysm, without rupture: Secondary | ICD-10-CM | POA: Diagnosis not present

## 2019-03-22 DIAGNOSIS — R197 Diarrhea, unspecified: Secondary | ICD-10-CM

## 2019-03-22 DIAGNOSIS — Z9221 Personal history of antineoplastic chemotherapy: Secondary | ICD-10-CM | POA: Diagnosis not present

## 2019-03-22 LAB — COMPREHENSIVE METABOLIC PANEL
ALT: 11 U/L (ref 0–44)
AST: 15 U/L (ref 15–41)
Albumin: 3.5 g/dL (ref 3.5–5.0)
Alkaline Phosphatase: 69 U/L (ref 38–126)
Anion gap: 8 (ref 5–15)
BUN: 21 mg/dL (ref 8–23)
CO2: 25 mmol/L (ref 22–32)
Calcium: 8.7 mg/dL — ABNORMAL LOW (ref 8.9–10.3)
Chloride: 107 mmol/L (ref 98–111)
Creatinine, Ser: 0.88 mg/dL (ref 0.61–1.24)
GFR calc Af Amer: >60 mL/min (ref >60)
GFR calc non Af Amer: >60 mL/min (ref >60)
Glucose, Bld: 96 mg/dL (ref 70–99)
Potassium: 4.1 mmol/L (ref 3.5–5.1)
Sodium: 140 mmol/L (ref 135–145)
Total Bilirubin: 0.3 mg/dL (ref 0.3–1.2)
Total Protein: 6.1 g/dL — ABNORMAL LOW (ref 6.5–8.1)

## 2019-03-22 LAB — CBC WITH DIFFERENTIAL/PLATELET
Abs Immature Granulocytes: 0 10*3/uL (ref 0.00–0.07)
Basophils Absolute: 0 10*3/uL (ref 0.0–0.1)
Basophils Relative: 1 %
Eosinophils Absolute: 0.1 10*3/uL (ref 0.0–0.5)
Eosinophils Relative: 2 %
HCT: 33 % — ABNORMAL LOW (ref 39.0–52.0)
Hemoglobin: 11.1 g/dL — ABNORMAL LOW (ref 13.0–17.0)
Immature Granulocytes: 0 %
Lymphocytes Relative: 15 %
Lymphs Abs: 0.4 10*3/uL — ABNORMAL LOW (ref 0.7–4.0)
MCH: 35.2 pg — ABNORMAL HIGH (ref 26.0–34.0)
MCHC: 33.6 g/dL (ref 30.0–36.0)
MCV: 104.8 fL — ABNORMAL HIGH (ref 80.0–100.0)
Monocytes Absolute: 0.4 10*3/uL (ref 0.1–1.0)
Monocytes Relative: 15 %
Neutro Abs: 1.9 10*3/uL (ref 1.7–7.7)
Neutrophils Relative %: 67 %
Platelets: 89 10*3/uL — ABNORMAL LOW (ref 150–400)
RBC: 3.15 MIL/uL — ABNORMAL LOW (ref 4.22–5.81)
RDW: 13.5 % (ref 11.5–15.5)
WBC: 2.8 10*3/uL — ABNORMAL LOW (ref 4.0–10.5)
nRBC: 0 % (ref 0.0–0.2)

## 2019-03-22 NOTE — Telephone Encounter (Signed)
Gave avs and calendar ° °

## 2019-03-26 LAB — CHROMOGRANIN A: Chromogranin A (ng/mL): 463.1 ng/mL — ABNORMAL HIGH (ref 0.0–101.8)

## 2019-05-07 ENCOUNTER — Telehealth: Payer: Self-pay | Admitting: *Deleted

## 2019-05-07 NOTE — Telephone Encounter (Signed)
Received call from patient as he was concerned about how he was feeling lately.  He states he is feeling somewhat anxious and weak overall. He is feeling more tired lately.  He is wondering if he needs to have his labs checked again. He is worried about being anemic.  Symptoms seem to be a bit vague.  Occasional shortness of breath or trouble taking a deep breath, but that doesn't last long.  He is wondering if he is having anxiety/panic attacks as well. From what he says and his sister, who is with him, says-he may feel better getting labs checked. His next appt is not until mid September.  Please advise.

## 2019-05-07 NOTE — Telephone Encounter (Signed)
I think Regan Rakers is coming back to work tomorrow at [employer], please schedule lab and see Lacie tomorrow afternoon then. Thanks   [name] MD

## 2019-05-08 ENCOUNTER — Other Ambulatory Visit: Payer: Self-pay

## 2019-05-08 ENCOUNTER — Inpatient Hospital Stay: Payer: Medicare Other

## 2019-05-08 ENCOUNTER — Encounter: Payer: Self-pay | Admitting: Nurse Practitioner

## 2019-05-08 ENCOUNTER — Inpatient Hospital Stay: Payer: Medicare Other | Attending: Hematology | Admitting: Nurse Practitioner

## 2019-05-08 VITALS — BP 156/95 | HR 64 | Temp 98.9°F | Resp 18 | Ht 74.0 in | Wt 145.3 lb

## 2019-05-08 DIAGNOSIS — Z9221 Personal history of antineoplastic chemotherapy: Secondary | ICD-10-CM | POA: Diagnosis not present

## 2019-05-08 DIAGNOSIS — Z79899 Other long term (current) drug therapy: Secondary | ICD-10-CM | POA: Insufficient documentation

## 2019-05-08 DIAGNOSIS — C7B02 Secondary carcinoid tumors of liver: Secondary | ICD-10-CM | POA: Insufficient documentation

## 2019-05-08 DIAGNOSIS — F419 Anxiety disorder, unspecified: Secondary | ICD-10-CM | POA: Insufficient documentation

## 2019-05-08 DIAGNOSIS — R197 Diarrhea, unspecified: Secondary | ICD-10-CM | POA: Diagnosis not present

## 2019-05-08 DIAGNOSIS — C7A029 Malignant carcinoid tumor of the large intestine, unspecified portion: Secondary | ICD-10-CM

## 2019-05-08 DIAGNOSIS — Z87891 Personal history of nicotine dependence: Secondary | ICD-10-CM | POA: Insufficient documentation

## 2019-05-08 DIAGNOSIS — F329 Major depressive disorder, single episode, unspecified: Secondary | ICD-10-CM | POA: Insufficient documentation

## 2019-05-08 DIAGNOSIS — R600 Localized edema: Secondary | ICD-10-CM | POA: Diagnosis not present

## 2019-05-08 DIAGNOSIS — D61818 Other pancytopenia: Secondary | ICD-10-CM | POA: Insufficient documentation

## 2019-05-08 LAB — CBC WITH DIFFERENTIAL/PLATELET
Abs Immature Granulocytes: 0.01 10*3/uL (ref 0.00–0.07)
Basophils Absolute: 0 10*3/uL (ref 0.0–0.1)
Basophils Relative: 1 %
Eosinophils Absolute: 0 10*3/uL (ref 0.0–0.5)
Eosinophils Relative: 1 %
HCT: 30.8 % — ABNORMAL LOW (ref 39.0–52.0)
Hemoglobin: 10.7 g/dL — ABNORMAL LOW (ref 13.0–17.0)
Immature Granulocytes: 0 %
Lymphocytes Relative: 15 %
Lymphs Abs: 0.4 10*3/uL — ABNORMAL LOW (ref 0.7–4.0)
MCH: 36.5 pg — ABNORMAL HIGH (ref 26.0–34.0)
MCHC: 34.7 g/dL (ref 30.0–36.0)
MCV: 105.1 fL — ABNORMAL HIGH (ref 80.0–100.0)
Monocytes Absolute: 0.4 10*3/uL (ref 0.1–1.0)
Monocytes Relative: 15 %
Neutro Abs: 1.9 10*3/uL (ref 1.7–7.7)
Neutrophils Relative %: 68 %
Platelets: 94 10*3/uL — ABNORMAL LOW (ref 150–400)
RBC: 2.93 MIL/uL — ABNORMAL LOW (ref 4.22–5.81)
RDW: 13.4 % (ref 11.5–15.5)
WBC: 2.8 10*3/uL — ABNORMAL LOW (ref 4.0–10.5)
nRBC: 0 % (ref 0.0–0.2)

## 2019-05-08 LAB — COMPREHENSIVE METABOLIC PANEL
ALT: 13 U/L (ref 0–44)
AST: 16 U/L (ref 15–41)
Albumin: 3.7 g/dL (ref 3.5–5.0)
Alkaline Phosphatase: 78 U/L (ref 38–126)
Anion gap: 12 (ref 5–15)
BUN: 15 mg/dL (ref 8–23)
CO2: 23 mmol/L (ref 22–32)
Calcium: 9 mg/dL (ref 8.9–10.3)
Chloride: 104 mmol/L (ref 98–111)
Creatinine, Ser: 0.78 mg/dL (ref 0.61–1.24)
GFR calc Af Amer: >60 mL/min (ref >60)
GFR calc non Af Amer: >60 mL/min (ref >60)
Glucose, Bld: 96 mg/dL (ref 70–99)
Potassium: 3.8 mmol/L (ref 3.5–5.1)
Sodium: 139 mmol/L (ref 135–145)
Total Bilirubin: 0.4 mg/dL (ref 0.3–1.2)
Total Protein: 6 g/dL — ABNORMAL LOW (ref 6.5–8.1)

## 2019-05-08 MED ORDER — SERTRALINE HCL 25 MG PO TABS: 25.0000 mg | ORAL_TABLET | Freq: Every day | ORAL | 3 refills | Status: DC

## 2019-05-08 NOTE — Telephone Encounter (Signed)
Jasmin, can you please call him and offer him lab and f/u with me this afternoon or tomorrow morning.  Thanks, Haygen Zebrowski

## 2019-05-08 NOTE — Progress Notes (Signed)
[place]   [phone] [phone]   Clinic Follow up Note   Patient Care Team: Leonard Downing, MD as PCP - General (Family Medicine) Date of Service: 05/08/2019   CHIEF COMPLAINT: Nervousness, depressed state   SUMMARY OF ONCOLOGIC HISTORY: Oncology History Overview Note  Carcinoid tumor of colon, malignant   Staging form: Colon and Rectum, AJCC 7th Edition     Clinical: Stage Unknown ([place], N2b, M1) - Unsigned     Carcinoid tumor of colon, malignant ([place])  02/16/2015 Imaging   CT abdomen and pelvis with contrast showed wall thickening of the cecum and terminal ileum high suspicious for cecal neoplasm. Extensive confluent ileal colic adenopathy extending chronically along the superior mesenteric vein, multiple hepatic metastasis   02/18/2015 Initial Diagnosis   Carcinoid tumor of colon, malignant, with liver metastasis   02/18/2015 Initial Biopsy   Liver biopsy showed metastatic low-grade neuroendocrine tumor (carcinoid). The tumor is positive for CD X2, CD 56, chromogranin, and synaptophysin.   03/05/2015 - 07/24/2018 Chemotherapy   Sandostatin 30 mg IM every 4 weeks. Due to disease progression regular monthly injections stopped after 07/24/18   03/06/2015 Imaging   CT chest showed no evidence of metastasis in the chest. 4.4 cm ascending aortic aneurysm.   09/09/2015 Imaging   Restaging CT abdomen and pelvis with contrast showed interval response of hepatic metastasis. Most are small and, some are stable. Slight interval decrease in mesenteric adenopathy and primary colon tumor.    12/06/2016 Imaging   CT CAP w contrast IMPRESSION: 1. Overall, the extent of metastatic disease to the liver and abdominal/retroperitoneal lymphatics is similar to the prior examination 08/19/2016. 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 2. Multiple small pulmonary nodules appear  unchanged in size, number and distribution, favored to be benign. 3. Aortic atherosclerosis, in addition to 2 vessel coronary artery disease. Assessment for potential risk factor modification, dietary therapy or pharmacologic therapy may be warranted, if clinically indicated. 4. In addition, there is dilatation of the aortic root (4.7 cm in diameter at the level of the sinuses of Valsalva) and ectasia of the ascending thoracic aorta measuring 4.4 cm in diameter. Recommend annual imaging followup by CTA or MRA. This recommendation follows 2010 ACCF/AHA/AATS/ACR/ASA/SCA/SCAI/SIR/STS/SVM Guidelines for the Diagnosis and Management of Patients with Thoracic Aortic Disease. Circulation. 2010; 121: T016-W109. 5. Trace volume of ascites. 6. Additional incidental findings, as above.    05/31/2017 Imaging   CT CAP W Contrast 05/31/17 IMPRESSION: 1. Overall the extent of metastatic disease to the liver and abdominal adenopathy is similar to previous exam from 12/06/2016. 2. 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 3. Stable small pulmonary nodules. 4.  Aortic Atherosclerosis (ICD10-I70.0). 5. Dilatation of the ascending thoracic aorta. Unchanged. Recommend annual imaging followup by CTA or MRA. This recommendation follows 2010 ACCF/AHA/AATS/ACR/ASA/SCA/SCAI/SIR/STS/SVM Guidelines for the Diagnosis and Management of Patients with Thoracic Aortic Disease. Circulation. 2010; 121: e266-e369 6. Trace ascites.   12/07/2017 Imaging   CT CAP W Contrast 12/07/17 IMPRESSION: Chest Impression: 1. No evidence of thoracic metastasis. 2. Stable ascending thoracic aortic aneurysm at 4.4 cm. Recommend attention on oncology surveillance. Abdomen / Pelvis Impression: 1. Mild interval increase in size of multifocal hepatic metastasis. 2. Large central small bowel mesenteric mass is unchanged 3. No bowel obstruction. 4. No skeletal lesions     06/12/2018 Imaging   06/12/2018 CT AP  IMPRESSION: Large mesenteric mass in the right mid abdomen, increased.  Multifocal hepatic metastases, increased.  Mild upper abdominal lymphadenopathy, mildly increased.  Masslike thickening involving the terminal ileum, suspicious for metastasis, similar to the prior. Additional nonspecific wall thickening involving multiple loops of small bowel in the pelvis.  Small volume pelvic ascites, mildly increased.   06/12/2018 Progression   IMPRESSION: Large mesenteric mass in the right mid abdomen, increased.  Multifocal hepatic metastases, increased.  Mild upper abdominal lymphadenopathy, mildly increased.  Masslike thickening involving the terminal ileum, suspicious for metastasis, similar to the prior. Additional nonspecific wall thickening involving multiple loops of small bowel in the pelvis.  Small volume pelvic ascites, mildly increased.   07/18/2018 PET scan   IMPRESSION: 1. Examination is positive for extensive neuroendocrine tumor receptor avid metastatic disease involving the liver, axial and appendicular skeleton, thoracic and abdominal lymph nodes. There is a dominant mass within the mesentery which is intensely radiotracer avid with a maximum dimension of 11.9 cm and an SUV max of 44.32. Within the distal small bowel loops there is a radiotracer avid lesion likely within the distal ileum. 2. When compared with CT from 06/13/2018 the tumor volume within the abdomen is not significantly changed in the interval. 3.  Aortic Atherosclerosis (ICD10-I70.0). 4. Coronary artery atherosclerotic calcifications 5. Nonobstructing left renal calculi   08/21/2018 - 02/05/2019 Chemotherapy   Lutathera treatment with Sandostatin injection 08/21/18, 10/16/18, 12/11/18, 02/05/19   03/12/2019 PET scan   IMPRESSION: 1. Marked interval positive response to peptide receptor radiotherapy. 2. Significant decrease in size and radiotracer activity  within liver metastasis. 3. Significant decrease in size and radiotracer activity of large peritoneal mass in RIGHT lower quadrant. 4. Decrease in size and radiotracer activity of retroperitoneal adenopathy in the upper abdomen. 5. Decrease in size and radiotracer activity or resolution of activity of the small skeletal metastasis.       INTERVAL HISTORY: Mr. Ryan Cowan presents for work-in visit. He was last seen in routine f/u with Dr. Burr Medico on 03/22/19. Lately he has been feeling "nervous and antsy" a lot. He has a history of claustrophobia. He lives in his mother's house alone and often feels isolated, paces the hall at night. Has difficulty staying asleep. He is irritated by certain things like traffic and dogs barking. He is indecisive. He lacks interest in TV shows he used to like and current events. He feels panic at times. He feels he might be in a depressed state. He has had a "passing thought of hopelessness." He denies SI. He has periods where he can't get a deep enough breath, sometimes dyspneic on exertion. Wearing a mask perpetuates this feeling. He gets anxious when others don't wear a mask like at church. He denies cough or chest pain during these times. His legs feel tired and weak. He has swelling in legs, left more than right, for years. Swelling improves with elevation. His calves "feel funny" with cramps occasionally. He is mostly sedenetary. He lives alone, completes ADLs independently but otherwise he is not active. Does not exercise. His appetite is lower lately, eats twice per day often at noon and dinnertime. Drinks only (decaf) sweat tea. His BMs have been different last 2 days, small frequent amounts of formed stool. Denies n/v/d. Denies abdominal pain but does notice intestinal "growling."  Denies flushing.  MEDICAL HISTORY:  Past Medical History:  Diagnosis Date  . Liver metastases ([place])     SURGICAL HISTORY: Past Surgical History:  Procedure Laterality Date  .  APPENDECTOMY    . KNEE SURGERY    . SHOULDER SURGERY      I have reviewed the social history and family history with the patient and they are unchanged from previous note.  ALLERGIES:  is allergic to codeine.  MEDICATIONS:  Current Outpatient Medications  Medication Sig Dispense Refill  . Multiple Vitamin (MULTIVITAMIN WITH MINERALS) TABS tablet Take 1 tablet by mouth daily. Centrum Silver 50 plus    . naproxen sodium (ANAPROX) 220 MG tablet Take 220 mg by mouth 2 (two) times daily as needed (pain).    Marland Kitchen OVER THE COUNTER MEDICATION Place 1 application into both nostrils daily as needed (congestion). Vicks inhaler stick    . sertraline (ZOLOFT) 25 MG tablet Take 1 tablet (25 mg total) by mouth daily. 30 tablet 3   No current facility-administered medications for this visit.     PHYSICAL EXAMINATION: ECOG PERFORMANCE STATUS: 1 - Symptomatic but completely ambulatory  Vitals:   05/08/19 1526  BP: (!) 156/95  Pulse: 64  Resp: 18  Temp: 98.9 F (37.2 C)  SpO2: 99%   Filed Weights   05/08/19 1526  Weight: 145 lb 4.8 oz (65.9 kg)    GENERAL:alert, no distress and comfortable SKIN: no rash  EYES:  sclera clear LUNGS: clear to auscultation with normal breathing effort HEART: regular rate & rhythm; L>R pitting lower extremity edema ABDOMEN:abdomen soft, non-tender and normal bowel sounds Musculoskeletal:no cyanosis of digits  NEURO: alert & oriented x 3 with fluent speech, normal gait  6 min walk test without desat, 96-98% O2 on room air   LABORATORY DATA:  I have reviewed the data as listed CBC Latest Ref Rng & Units 05/08/2019 03/22/2019 02/05/2019  WBC 4.0 - 10.5 K/uL 2.8(L) 2.8(L) 3.1(L)  Hemoglobin 13.0 - 17.0 g/dL 10.7(L) 11.1(L) 11.2(L)  Hematocrit 39.0 - 52.0 % 30.8(L) 33.0(L) 32.4(L)  Platelets 150 - 400 K/uL 94(L) 89(L) 136(L)     CMP Latest Ref Rng & Units 05/08/2019 03/22/2019 01/29/2019  Glucose 70 - 99 mg/dL 96 96 100(H)  BUN 8 - 23 mg/dL 15 21 18   Creatinine  0.61 - 1.24 mg/dL 0.78 0.88 0.91  Sodium 135 - 145 mmol/L 139 140 142  Potassium 3.5 - 5.1 mmol/L 3.8 4.1 4.0  Chloride 98 - 111 mmol/L 104 107 107  CO2 22 - 32 mmol/L 23 25 25   Calcium 8.9 - 10.3 mg/dL 9.0 8.7(L) 8.6(L)  Total Protein 6.5 - 8.1 g/dL 6.0(L) 6.1(L) 6.1(L)  Total Bilirubin 0.3 - 1.2 mg/dL 0.4 0.3 0.3  Alkaline Phos 38 - 126 U/L 78 69 78  AST 15 - 41 U/L 16 15 18   ALT 0 - 44 U/L 13 11 13       RADIOGRAPHIC STUDIES: I have personally reviewed the radiological images as listed and agreed with the findings in the report. No results found.   ASSESSMENT & PLAN: 73 yo male with   1. Anxiety and depression -he has symptoms of both which seem to moderately interfere with daily life, worsened lately by covid-19 pandemic.  -Mr. Krawiec appears stable for outpatient management, no apparent threat to himself or others.  -He is willing to consider medication, I recommend low dose sertraline for predominant anxiety. I reviewed potential side effects and dosing. He agrees to try. I encouraged him to limit NSAID use -I will refer him to SW for reports of hopelessness; he has church and family support also -phone visit in 4 weeks to monitor symptoms and response to treatment.  He does not see PCP lately   2. Bilateral lower leg edema, L>R, with calf cramps -He has longstanding LE edema, L>R with new onset calf cramps  -he elevates legs during rest which helps swelling -no evidence of hypoxia, no respiratory symptoms  -in the setting of metastatic cancer and sedentary lifestyle, will obtain doppler to r/o DVT -I encouraged him to increase activity and ambulation, drink adequate fluids  3. 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  -s/p 4 treatment with Lutathera per Dr. Leonia Reeves 08/2018 - 02/2019; DOTATATE PET shows good response -he is under observation now -Labs  reviewed, continues to have pancytopenia, but stable. CMP unremarkable  -Currently asymptomatic  -next routine f/u with Dr. Burr Medico in 06/2019   PLAN: -Labs reviewed -Begin sertraline 25 mg daily, Rx sent to pharmacy  -Phone visit in 4 weeks to evaluate response to therapy  -Refer to SW for anxiety, depression  -Doppler study for L>R LE edema and calf cramps  -next routine f/u with Dr. Burr Medico in 06/2019   All questions were answered. The patient knows to call the clinic with any problems, questions or concerns. No barriers to learning was detected. I spent 40 minutes counseling the patient face to face. The total time spent in the appointment was 45 minutes and more than 50% was on counseling and review of test results     Alla Feeling, NP 05/09/19

## 2019-05-09 ENCOUNTER — Telehealth: Payer: Self-pay | Admitting: Nurse Practitioner

## 2019-05-09 ENCOUNTER — Encounter (HOSPITAL_COMMUNITY): Payer: Self-pay | Admitting: Emergency Medicine

## 2019-05-09 ENCOUNTER — Emergency Department (HOSPITAL_COMMUNITY): Payer: Medicare Other

## 2019-05-09 ENCOUNTER — Emergency Department (HOSPITAL_COMMUNITY)
Admission: EM | Admit: 2019-05-09 | Discharge: 2019-05-09 | Disposition: A | Discharge status: Home / Self Care | Payer: Medicare Other | Attending: Emergency Medicine | Admitting: Emergency Medicine

## 2019-05-09 ENCOUNTER — Encounter: Payer: Self-pay | Admitting: Nurse Practitioner

## 2019-05-09 ENCOUNTER — Other Ambulatory Visit: Payer: Self-pay

## 2019-05-09 DIAGNOSIS — Z87891 Personal history of nicotine dependence: Secondary | ICD-10-CM | POA: Diagnosis not present

## 2019-05-09 DIAGNOSIS — Z79899 Other long term (current) drug therapy: Secondary | ICD-10-CM | POA: Insufficient documentation

## 2019-05-09 DIAGNOSIS — F419 Anxiety disorder, unspecified: Secondary | ICD-10-CM | POA: Diagnosis present

## 2019-05-09 DIAGNOSIS — R531 Weakness: Secondary | ICD-10-CM | POA: Diagnosis not present

## 2019-05-09 LAB — BASIC METABOLIC PANEL
Anion gap: 10 (ref 5–15)
BUN: 21 mg/dL (ref 8–23)
CO2: 26 mmol/L (ref 22–32)
Calcium: 8.9 mg/dL (ref 8.9–10.3)
Chloride: 104 mmol/L (ref 98–111)
Creatinine, Ser: 0.75 mg/dL (ref 0.61–1.24)
GFR calc Af Amer: >60 mL/min (ref >60)
GFR calc non Af Amer: >60 mL/min (ref >60)
Glucose, Bld: 91 mg/dL (ref 70–99)
Potassium: 3.6 mmol/L (ref 3.5–5.1)
Sodium: 140 mmol/L (ref 135–145)

## 2019-05-09 LAB — CBC WITH DIFFERENTIAL/PLATELET
Abs Immature Granulocytes: 0.01 10*3/uL (ref 0.00–0.07)
Basophils Absolute: 0 10*3/uL (ref 0.0–0.1)
Basophils Relative: 1 %
Eosinophils Absolute: 0 10*3/uL (ref 0.0–0.5)
Eosinophils Relative: 0 %
HCT: 33.2 % — ABNORMAL LOW (ref 39.0–52.0)
Hemoglobin: 11.3 g/dL — ABNORMAL LOW (ref 13.0–17.0)
Immature Granulocytes: 0 %
Lymphocytes Relative: 11 %
Lymphs Abs: 0.3 10*3/uL — ABNORMAL LOW (ref 0.7–4.0)
MCH: 36.9 pg — ABNORMAL HIGH (ref 26.0–34.0)
MCHC: 34 g/dL (ref 30.0–36.0)
MCV: 108.5 fL — ABNORMAL HIGH (ref 80.0–100.0)
Monocytes Absolute: 0.5 10*3/uL (ref 0.1–1.0)
Monocytes Relative: 16 %
Neutro Abs: 2.3 10*3/uL (ref 1.7–7.7)
Neutrophils Relative %: 72 %
Platelets: 113 10*3/uL — ABNORMAL LOW (ref 150–400)
RBC: 3.06 MIL/uL — ABNORMAL LOW (ref 4.22–5.81)
RDW: 13.8 % (ref 11.5–15.5)
WBC: 3.1 10*3/uL — ABNORMAL LOW (ref 4.0–10.5)
nRBC: 0 % (ref 0.0–0.2)

## 2019-05-09 LAB — URIC ACID: Uric Acid, Serum: 5.6 mg/dL (ref 3.7–8.6)

## 2019-05-09 LAB — TROPONIN I (HIGH SENSITIVITY): Troponin I (High Sensitivity): 5 ng/L (ref <18)

## 2019-05-09 MED ORDER — HYDROXYZINE HCL 10 MG PO TABS: 10.0000 mg | ORAL_TABLET | Freq: Three times a day (TID) | ORAL | 0 refills | Status: DC | PRN

## 2019-05-09 MED ORDER — HYDROXYZINE HCL 10 MG PO TABS
10.0000 mg | ORAL_TABLET | Freq: Once | ORAL | Status: AC
  Administered 2019-05-09: 10 mg via ORAL
  Filled 2019-05-09: qty 1

## 2019-05-09 NOTE — ED Notes (Addendum)
Patient reports he believes he had 2 panic attacks at home this morning.No thoughts of hurting or killing himself. Has prescription for Sertraline,  And took first dose last night. Patient reports that he was reading "all of the possible sie effects and reactions, and I think it got to me".

## 2019-05-09 NOTE — ED Provider Notes (Signed)
[facility] DEPT Provider Note   CSN: [id number] Arrival date & time: 05/09/19  3810     History   Chief Complaint Chief Complaint  Patient presents with  . Anxiety  . Weakness    HPI Ryan Cowan is a 73 y.o. male.  Patient presents to the emergency department with chief complaint of panic attacks.  Patient states he has been dealing with a lot of anxiety lately, started by his oncology NP on sertraline.  Took first dose last night.  This morning patient states that he was feeling very anxious and called the rescue squad.  Patient states his anxiety has subsided.  Throughout this time he denies any thoughts of hurting himself, hurting others, auditory visual hallucination.  Patient denies any chest pain, difficulty breathing.  Was concerned about possible side effects from the medication.  Was not experiencing any specific symptoms though that made him concerned.     HPI  Past Medical History:  Diagnosis Date  . Liver metastases [facility])     Patient Active Problem List   Diagnosis Date Noted  . Carcinoid tumor of colon, malignant ([place]) 04/03/2015  . Iron deficiency anemia due to chronic blood loss 02/27/2015  . Protein-calorie malnutrition, severe ([place]) 02/17/2015  . Liver metastases ([place])   . Symptomatic anemia 02/16/2015  . DOE (dyspnea on exertion) 02/16/2015  . Thrombocytosis ([place]) 02/16/2015  . Chronic diarrhea 02/16/2015  . Weight loss, non-intentional 02/16/2015  . Former tobacco use 02/16/2015  . Hypoalbuminemia due to protein-calorie malnutrition ([place]) 02/16/2015    Past Surgical History:  Procedure Laterality Date  . APPENDECTOMY    . KNEE SURGERY    . SHOULDER SURGERY          Home Medications    Prior to Admission medications   Medication Sig Start Date End Date Taking? Authorizing Provider  Multiple Vitamin (MULTIVITAMIN WITH MINERALS) TABS tablet Take 1 tablet by mouth daily. Centrum Silver 50 plus    [provider]  naproxen sodium (ANAPROX) 220 MG tablet Take 220 mg by mouth 2 (two) times daily as needed (pain).    [provider]  OVER THE COUNTER MEDICATION Place 1 application into both nostrils daily as needed (congestion). Vicks inhaler stick    [provider]  sertraline (ZOLOFT) 25 MG tablet Take 1 tablet (25 mg total) by mouth daily. 05/08/19   Alla Feeling, NP    Family History Family History  Problem Relation Age of Onset  . CAD Mother   . CAD Father   . Hypertension Father   . Hypertension Sister     Social History Social History   Tobacco Use  . Smoking status: Former Smoker    Packs/day: 2.50    Years: 50.00    Pack years: 125.00    Quit date: 10/04/2011    Years since quitting: 7.6  . Smokeless tobacco: Never Used  Substance Use Topics  . Alcohol use: No  . Drug use: Not on file     Allergies   Codeine   Review of Systems Review of Systems  Constitutional: Negative for chills and fever.  HENT: Negative for ear pain and sore throat.   Eyes: Negative for pain and visual disturbance.  Respiratory: Negative for cough and shortness of breath.   Cardiovascular: Negative for chest pain and palpitations.  Gastrointestinal: Negative for abdominal pain and vomiting.  Genitourinary: Negative for dysuria and hematuria.  Musculoskeletal: Negative for arthralgias and back pain.  Skin:  Negative for color change and rash.  Neurological: Negative for seizures and syncope.  Psychiatric/Behavioral: Negative for suicidal ideas.  All other systems reviewed and are negative.    Physical Exam Updated Vital Signs BP (!) 181/84   Pulse 83   Temp 98.1 F (36.7 C) (Oral)   Resp 20   Ht 6\' 1"  (1.854 m)   Wt 66 kg   SpO2 100%   BMI 19.20 kg/m   Physical Exam Vitals signs and nursing note reviewed.  Constitutional:      Appearance: He is well-developed.  HENT:     Head: Normocephalic and atraumatic.  Eyes:     Conjunctiva/sclera:  Conjunctivae normal.  Neck:     Musculoskeletal: Neck supple.  Cardiovascular:     Rate and Rhythm: Normal rate and regular rhythm.     Heart sounds: No murmur.  Pulmonary:     Effort: Pulmonary effort is normal. No respiratory distress.     Breath sounds: Normal breath sounds.  Abdominal:     Palpations: Abdomen is soft.     Tenderness: There is no abdominal tenderness.  Skin:    General: Skin is warm and dry.  Neurological:     Mental Status: He is alert.  Psychiatric:     Comments: No suicidal or homicidal thoughts      ED Treatments / Results  Labs (all labs ordered are listed, but only abnormal results are displayed) Labs Reviewed - No data to display  EKG None  Radiology No results found.  Procedures Procedures (including critical care time)  Medications Ordered in ED Medications - No data to display   Initial Impression / Assessment and Plan / ED Course  I have reviewed the triage vital signs and the nursing notes.  Pertinent labs & imaging results that were available during my care of the patient were reviewed by me and considered in my medical decision making (see chart for details).        73 year old gentleman presented to the ER with episodes of anxiety.  Here patient was well-appearing, had stable vital signs throughout duration of stay.  Labs unchanged from yesterday, chest x-ray negative, EKG unremarkable.  Suspect symptoms related to underlying anxiety.  No SI, HI, AVH.  Provided short prescription for low-dose Atarax for as needed.  Recommend continuing previously prescribed sertraline and follow-up with primary care doctor.  Final Clinical Impressions(s) / ED Diagnoses   Final diagnoses:  Anxiety    ED Discharge Orders    None       Lucrezia Starch, MD 05/09/19 2315

## 2019-05-09 NOTE — Telephone Encounter (Signed)
Scheduled appt per 8/5 los.  Spoke with patient and he is aware of his appt date and time.

## 2019-05-09 NOTE — ED Triage Notes (Signed)
Pt BIBA from home.    Per EMS- Pt c/o anxiety, ongoing issue.  Pt reporting worsening anxiety in recent months.  Pt reports hx of cancer- Recently (yesterday) was prescribed medicine for anxiety.  Pt stated that he had increased panic attack since starting meds.   Pt c/o generalized weakness for about a month.

## 2019-05-09 NOTE — Discharge Instructions (Addendum)
Please continue the previously prescribed Zoloft. If you develop any chest pain, difficutly breathing, fever, or other new concerning symptom, please return here for reassessment.  Please call either your primary doctor or oncology NP Lacie to schedule a close follow up appointment for management of your anxiety.   Future Appointments  Date Time Provider [place]  05/15/2019 10:00 AM [facility] US [facility] [facility]  06/06/2019 11:45 AM Alla Feeling, NP [facility] None  06/21/2019  1:45 PM [facility] LAB 3 [facility] None  06/21/2019  2:15 PM [name], MD [facility] None

## 2019-05-14 ENCOUNTER — Telehealth: Payer: Self-pay | Admitting: Nurse Practitioner

## 2019-05-14 NOTE — Telephone Encounter (Signed)
I called patient to f/u on recent ED visit for anxiety. His sister got on the phone to inform me he is still not doing well with anxiety. He is "sighing a lot", remains weak, fatigued, belching, and having diarrhea. He watches his pulse on his watch which makes him more anxious. They are asking for an appointment. He has US doppler 8/12 at 11. I discussed with Sandi Mealy, PA who can see him in [facility] tomorrow after doppler. In the meantime I encouraged him to push po liquids and take imodium for diarrhea. Sister understands. I sent schedule message for f/u on 8/12. She is aware of date/time and appreciates the call.  Cira Rue, NP  05/14/2019

## 2019-05-15 ENCOUNTER — Encounter: Payer: Self-pay | Admitting: General Practice

## 2019-05-15 ENCOUNTER — Telehealth: Payer: Self-pay | Admitting: Medical

## 2019-05-15 ENCOUNTER — Ambulatory Visit (HOSPITAL_COMMUNITY)
Admission: RE | Admit: 2019-05-15 | Discharge: 2019-05-15 | Disposition: A | Discharge status: Home / Self Care | Payer: Medicare Other | Source: Ambulatory Visit | Attending: Nurse Practitioner | Admitting: Nurse Practitioner

## 2019-05-15 ENCOUNTER — Inpatient Hospital Stay (HOSPITAL_BASED_OUTPATIENT_CLINIC_OR_DEPARTMENT_OTHER): Payer: Medicare Other | Admitting: Medical

## 2019-05-15 ENCOUNTER — Other Ambulatory Visit: Payer: Self-pay | Admitting: Emergency Medicine

## 2019-05-15 ENCOUNTER — Other Ambulatory Visit: Payer: Self-pay

## 2019-05-15 ENCOUNTER — Telehealth: Payer: Self-pay | Admitting: *Deleted

## 2019-05-15 VITALS — BP 138/81 | HR 57 | Temp 98.3°F | Resp 18 | Ht 73.0 in | Wt 139.1 lb

## 2019-05-15 DIAGNOSIS — R197 Diarrhea, unspecified: Secondary | ICD-10-CM

## 2019-05-15 DIAGNOSIS — C7A029 Malignant carcinoid tumor of the large intestine, unspecified portion: Secondary | ICD-10-CM

## 2019-05-15 DIAGNOSIS — R600 Localized edema: Secondary | ICD-10-CM | POA: Insufficient documentation

## 2019-05-15 DIAGNOSIS — F419 Anxiety disorder, unspecified: Secondary | ICD-10-CM | POA: Diagnosis not present

## 2019-05-15 DIAGNOSIS — R142 Eructation: Secondary | ICD-10-CM | POA: Diagnosis not present

## 2019-05-15 LAB — CMP (CANCER CENTER ONLY)
ALT: 13 U/L (ref 0–44)
AST: 13 U/L — ABNORMAL LOW (ref 15–41)
Albumin: 3.5 g/dL (ref 3.5–5.0)
Alkaline Phosphatase: 77 U/L (ref 38–126)
Anion gap: 11 (ref 5–15)
BUN: 14 mg/dL (ref 8–23)
CO2: 26 mmol/L (ref 22–32)
Calcium: 9.1 mg/dL (ref 8.9–10.3)
Chloride: 101 mmol/L (ref 98–111)
Creatinine: 0.81 mg/dL (ref 0.61–1.24)
GFR, Est AFR Am: >60 mL/min (ref >60)
GFR, Estimated: >60 mL/min (ref >60)
Glucose, Bld: 93 mg/dL (ref 70–99)
Potassium: 3.7 mmol/L (ref 3.5–5.1)
Sodium: 138 mmol/L (ref 135–145)
Total Bilirubin: 0.5 mg/dL (ref 0.3–1.2)
Total Protein: 6.2 g/dL — ABNORMAL LOW (ref 6.5–8.1)

## 2019-05-15 LAB — CBC WITH DIFFERENTIAL (CANCER CENTER ONLY)
Abs Immature Granulocytes: 0.01 10*3/uL (ref 0.00–0.07)
Basophils Absolute: 0 10*3/uL (ref 0.0–0.1)
Basophils Relative: 0 %
Eosinophils Absolute: 0 10*3/uL (ref 0.0–0.5)
Eosinophils Relative: 1 %
HCT: 32.5 % — ABNORMAL LOW (ref 39.0–52.0)
Hemoglobin: 11.1 g/dL — ABNORMAL LOW (ref 13.0–17.0)
Immature Granulocytes: 0 %
Lymphocytes Relative: 10 %
Lymphs Abs: 0.3 10*3/uL — ABNORMAL LOW (ref 0.7–4.0)
MCH: 36 pg — ABNORMAL HIGH (ref 26.0–34.0)
MCHC: 34.2 g/dL (ref 30.0–36.0)
MCV: 105.5 fL — ABNORMAL HIGH (ref 80.0–100.0)
Monocytes Absolute: 0.5 10*3/uL (ref 0.1–1.0)
Monocytes Relative: 13 %
Neutro Abs: 2.7 10*3/uL (ref 1.7–7.7)
Neutrophils Relative %: 76 %
Platelet Count: 117 10*3/uL — ABNORMAL LOW (ref 150–400)
RBC: 3.08 MIL/uL — ABNORMAL LOW (ref 4.22–5.81)
RDW: 13.5 % (ref 11.5–15.5)
WBC Count: 3.6 10*3/uL — ABNORMAL LOW (ref 4.0–10.5)
nRBC: 0 % (ref 0.0–0.2)

## 2019-05-15 LAB — MAGNESIUM: Magnesium: 2 mg/dL (ref 1.7–2.4)

## 2019-05-15 MED ORDER — HYDROXYZINE HCL 10 MG PO TABS: 10.0000 mg | ORAL_TABLET | Freq: Three times a day (TID) | ORAL | 3 refills | Status: DC | PRN

## 2019-05-15 NOTE — Progress Notes (Signed)
These preliminary result these preliminary results were noted.  Awaiting final report.

## 2019-05-15 NOTE — Progress Notes (Signed)
[place] CSW Progress Notes  Referral received from NP to contact patient to provide support/resources related to anxiety and "hopelessness."  Attempted to contact patient by phone - no answer and VM not set up.  Reviewed chart - patient is linked w PCP and reports support from church and family.  Patient currently in [facility]om Management Clinic, spoke w Learta Codding RN - "he is very anxious, withdrawn, really wants to talk to somebody, he tends to get into his own head."  Patient will need to be seen today or contacted within the next few days.  CSW Wallis Bamberg will see patient today in Symptom Management.  Edwyna Shell, LCSW Clinical Social Worker Phone:  [phone]

## 2019-05-15 NOTE — Patient Instructions (Signed)
COVID-19: How to Protect Yourself and Others Know how it spreads  There is currently no vaccine to prevent coronavirus disease 2019 (COVID-19).  The best way to prevent illness is to avoid being exposed to this virus.  The virus is thought to spread mainly from person-to-person. ? Between people who are in close contact with one another (within about 6 feet). ? Through respiratory droplets produced when an infected person coughs, sneezes or talks. ? These droplets can land in the mouths or noses of people who are nearby or possibly be inhaled into the lungs. ? Some recent studies have suggested that COVID-19 may be spread by people who are not showing symptoms. Everyone should Clean your hands often  Wash your hands often with soap and water for at least 20 seconds especially after you have been in a public place, or after blowing your nose, coughing, or sneezing.  If soap and water are not readily available, use a hand sanitizer that contains at least 60% alcohol. Cover all surfaces of your hands and rub them together until they feel dry.  Avoid touching your eyes, nose, and mouth with unwashed hands. Avoid close contact  Stay home if you are sick.  Avoid close contact with people who are sick.  Put distance between yourself and other people. ? Remember that some people without symptoms may be able to spread virus. ? This is especially important for people who are at higher risk of getting very sick.www.cdc.gov/coronavirus/2019-ncov/need-extra-precautions/people-at-higher-risk.html Cover your mouth and nose with a cloth face cover when around others  You could spread COVID-19 to others even if you do not feel sick.  Everyone should wear a cloth face cover when they have to go out in public, for example to the grocery store or to pick up other necessities. ? Cloth face coverings should not be placed on young children under age 2, anyone who has trouble breathing, or is unconscious,  incapacitated or otherwise unable to remove the mask without assistance.  The cloth face cover is meant to protect other people in case you are infected.  Do NOT use a facemask meant for a healthcare worker.  Continue to keep about 6 feet between yourself and others. The cloth face cover is not a substitute for social distancing. Cover coughs and sneezes  If you are in a private setting and do not have on your cloth face covering, remember to always cover your mouth and nose with a tissue when you cough or sneeze or use the inside of your elbow.  Throw used tissues in the trash.  Immediately wash your hands with soap and water for at least 20 seconds. If soap and water are not readily available, clean your hands with a hand sanitizer that contains at least 60% alcohol. Clean and disinfect  Clean AND disinfect frequently touched surfaces daily. This includes tables, doorknobs, light switches, countertops, handles, desks, phones, keyboards, toilets, faucets, and sinks. www.cdc.gov/coronavirus/2019-ncov/prevent-getting-sick/disinfecting-your-home.html  If surfaces are dirty, clean them: Use detergent or soap and water prior to disinfection.  Then, use a household disinfectant. You can see a list of EPA-registered household disinfectants here. cdc.gov/coronavirus 02/05/2019 This information is not intended to replace advice given to you by your health care provider. Make sure you discuss any questions you have with your health care provider. Document Released: 01/15/2019 Document Revised: 02/13/2019 Document Reviewed: 01/15/2019 Elsevier Patient Education  2020 Elsevier Inc.  

## 2019-05-15 NOTE — Progress Notes (Signed)
Pt to be followed up with by CSW within the next few days for depression/anxiety.  Confirmed mobile phone number in chart as preferred contact.  Pt aware that he will be contacted by SW.

## 2019-05-15 NOTE — Telephone Encounter (Signed)
Scheduled per 08/12 los, patient received copy of avs and calender.

## 2019-05-15 NOTE — Progress Notes (Signed)
Bilateral lower extremity venous duplex completed. Preliminary results in Chart review CV Proc. [facility], [place] 05/15/2019,11:04 AM

## 2019-05-15 NOTE — Progress Notes (Addendum)
[facility] Progress Note   Ryan Cowan [id number] [birth year] 73 y.o.  Ryan Cowan is managed by Dr. [name]  Actively treated with chemotherapy/immunotherapy/hormonal therapy: observation  Last treated: 02/2019  Next scheduled appointment with provider: 06/21/2019  Assessment: Plan:    The patient will return in 1 week for dosing of Sandostatin.  He will see Dr. Burr Medico in follow-up on 06/21/2019 with CT scans completed prior to his return.    Anxiety: The patient will continue taking sertraline as prescribed.  Belching: Patient was given a prescription for hydroxyzine 10 mg every 8 hours.  Please see After Visit Summary for patient specific instructions.  Future Appointments  Date Time Provider [place]  05/22/2019  1:15 PM [facility] [place] [facility] None  06/06/2019 11:45 AM Alla Feeling, NP [facility] None  06/21/2019  1:45 PM [facility] LAB 3 [facility] None  06/21/2019  2:20 PM [name], MD [facility] None    No orders of the defined types were placed in this encounter.      Subjective:   Patient ID:  Ryan Cowan is a 73 y.o. (DOB 1946/09/27) male.  Chief Complaint:  Chief Complaint  Patient presents with  . 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 He was treated with sandostatin until he was noted to have disease progression in 06/2018. He was then received 4 treatments with Lutathera from 08/2018 - 02/2019. His PET scan showed a good response. He is now managed with observation. He was most recently seen in the ER on 05/09/2019 with a chief complaint of panic attacks.  He reported that he had been dealing with a lot of anxiety. He  recently started sertraline on 05/08/2019.  Ryan Cowan was contacted yesterday. He remains weak, fatigued, is belching, and is having diarrhea. He checks his  pulse which makes him more anxious.  He had 4 episodes of diarrhea this morning.  He reports that his diarrhea has been increasing in frequency.  He has lost approximately 6 pounds since his last visit.  He is having anorexia.  He is not having any flushing.  His last chromogranin A returned at 463.1 and his last 5 HIAA returned at 15.6.  Medications: I have reviewed the patient's current medications.  Allergies:  Allergies  Allergen Reactions  . Codeine Other (See Comments)    hallucinations    Past Medical History:  Diagnosis Date  . Liver metastases [facility])     Past Surgical History:  Procedure Laterality Date  . APPENDECTOMY    . KNEE SURGERY    . SHOULDER SURGERY      Family History  Problem Relation Age of Onset  . CAD Mother   . CAD Father   . Hypertension Father   . Hypertension Sister     Social History   Socioeconomic History  . Marital status: Single    Spouse name: Not on file  . Number of children: Not on file  . Years of education: Not on file  . Highest education level: Not on file  Occupational History  . Not on file  Social Needs  . Financial resource strain: Not on file  . Food insecurity    Worry: Not on file    Inability: Not on file  . Transportation needs    Medical: Not on file  Non-medical: Not on file  Tobacco Use  . Smoking status: Former Smoker    Packs/day: 2.50    Years: 50.00    Pack years: 125.00    Quit date: 10/04/2011    Years since quitting: 7.6  . Smokeless tobacco: Never Used  Substance and Sexual Activity  . Alcohol use: No  . Drug use: Not on file  . Sexual activity: Not on file  Lifestyle  . Physical activity    Days per week: Not on file    Minutes per session: Not on file  . Stress: Not on file  Relationships  . Social Herbalist on phone: Not on file    Gets together: Not on file    Attends religious service: Not on file    Active member of club or organization: Not on file    Attends meetings  of clubs or organizations: Not on file    Relationship status: Not on file  . Intimate partner violence    Fear of current or ex partner: Not on file    Emotionally abused: Not on file    Physically abused: Not on file    Forced sexual activity: Not on file  Other Topics Concern  . Not on file  Social History Narrative   Lives alone in his mother's home (in SNF)   Has #3 sisters, [name] is POA   Prior employed in [employer] and was in [employer] 4 years   Very detail oriented in conversation    Past Medical History, Surgical history, Social history, and Family history were reviewed and updated as appropriate.   Please see review of systems for further details on the patient's review from today.   Review of Systems:  Review of Systems  Constitutional: Positive for fatigue. Negative for appetite change, chills, diaphoresis and fever.  HENT: Negative for trouble swallowing.   Respiratory: Negative for cough, chest tightness and shortness of breath.   Cardiovascular: Negative for chest pain, palpitations and leg swelling.  Gastrointestinal: Positive for diarrhea. Negative for abdominal distention, abdominal pain, blood in stool, constipation, nausea and vomiting.  Genitourinary: Negative for decreased urine volume and difficulty urinating.  Neurological: Positive for weakness.  Psychiatric/Behavioral: The patient is nervous/anxious.     Objective:   Physical Exam:  BP 138/81 (BP Location: Left Arm, Patient Position: Sitting)   Pulse (!) 57   Temp 98.3 F (36.8 C) (Temporal)   Resp 18   Ht 6\' 1"  (1.854 m)   Wt 139 lb 1.6 oz (63.1 kg)   SpO2 100%   BMI 18.35 kg/m  ECOG: 1  Physical Exam Constitutional:      General: He is not in acute distress.    Appearance: He is not diaphoretic.  HENT:     Head: Normocephalic and atraumatic.  Eyes:     General: No scleral icterus.       Right eye: No discharge.        Left eye: No discharge.     Conjunctiva/sclera:  Conjunctivae normal.  Cardiovascular:     Rate and Rhythm: Normal rate and regular rhythm.     Heart sounds: Normal heart sounds. No murmur. No friction rub. No gallop.   Pulmonary:     Effort: Pulmonary effort is normal. No respiratory distress.     Breath sounds: Normal breath sounds. No wheezing or rales.  Abdominal:     General: Bowel sounds are normal. There is no distension.  Palpations: There is no mass.     Tenderness: There is no abdominal tenderness. There is no guarding.  Skin:    General: Skin is warm and dry.     Findings: No erythema or rash.  Neurological:     Mental Status: He is alert.     Gait: Gait abnormal (The patient is ambulating with the use of a wheelchair.).     Lab Review:     Component Value Date/Time   NA 138 05/15/2019 1141   NA 141 09/08/2017 1243   K 3.7 05/15/2019 1141   K 3.8 09/08/2017 1243   CL 101 05/15/2019 1141   CO2 26 05/15/2019 1141   CO2 25 09/08/2017 1243   GLUCOSE 93 05/15/2019 1141   GLUCOSE 94 09/08/2017 1243   BUN 14 05/15/2019 1141   BUN 19.9 09/08/2017 1243   CREATININE 0.81 05/15/2019 1141   CREATININE 0.8 09/08/2017 1243   CALCIUM 9.1 05/15/2019 1141   CALCIUM 8.8 09/08/2017 1243   PROT 6.2 (L) 05/15/2019 1141   PROT 6.3 (L) 09/08/2017 1243   ALBUMIN 3.5 05/15/2019 1141   ALBUMIN 3.4 (L) 09/08/2017 1243   AST 13 (L) 05/15/2019 1141   AST 17 09/08/2017 1243   ALT 13 05/15/2019 1141   ALT 11 09/08/2017 1243   ALKPHOS 77 05/15/2019 1141   ALKPHOS 84 09/08/2017 1243   BILITOT 0.5 05/15/2019 1141   BILITOT 0.42 09/08/2017 1243   GFRNONAA >60 05/15/2019 1141   GFRAA >60 05/15/2019 1141       Component Value Date/Time   WBC 3.6 (L) 05/15/2019 1141   WBC 3.1 (L) 05/09/2019 1649   RBC 3.08 (L) 05/15/2019 1141   HGB 11.1 (L) 05/15/2019 1141   HGB 13.0 09/08/2017 1243   HCT 32.5 (L) 05/15/2019 1141   HCT 39.0 09/08/2017 1243   PLT 117 (L) 05/15/2019 1141   PLT 221 09/08/2017 1243   MCV 105.5 (H) 05/15/2019  1141   MCV 92.6 09/08/2017 1243   MCH 36.0 (H) 05/15/2019 1141   MCHC 34.2 05/15/2019 1141   RDW 13.5 05/15/2019 1141   RDW 13.2 09/08/2017 1243   LYMPHSABS 0.3 (L) 05/15/2019 1141   LYMPHSABS 1.0 09/08/2017 1243   MONOABS 0.5 05/15/2019 1141   MONOABS 0.7 09/08/2017 1243   EOSABS 0.0 05/15/2019 1141   EOSABS 0.1 09/08/2017 1243   BASOSABS 0.0 05/15/2019 1141   BASOSABS 0.1 09/08/2017 1243   -------------------------------  Imaging from last 24 hours (if applicable):  Radiology interpretation: Dg Chest Portable 1 View  Result Date: 05/09/2019 CLINICAL DATA:  Chest pain. Shortness of breath. Anxiety. Generalized weakness. EXAM: PORTABLE CHEST 1 VIEW COMPARISON:  PET-CT 03/12/2019.  Chest radiographs 02/16/2015. FINDINGS: The cardiomediastinal silhouette is within normal limits. The lungs remain hyperinflated without evidence of airspace consolidation, edema, sizable pleural effusion, or pneumothorax. The right lateral costophrenic angle was incompletely imaged. No acute osseous abnormality is seen. IMPRESSION: No active disease. Electronically Signed   By: Logan Bores M.D.   On: 05/09/2019 16:39   Vas [place] Lower Extremity Venous (dvt)  Result Date: 05/15/2019  Lower Venous Study Indications: Edema.  Risk Factors: Surgery Colon, liver. Comparison Study: No previous study available for comparison. Performing Technologist: Toma Copier RVS  Examination Guidelines: A complete evaluation includes B-mode imaging, spectral Doppler, color Doppler, and power Doppler as needed of all accessible portions of each vessel. Bilateral testing is considered an integral part of a complete examination. Limited examinations for reoccurring indications may be performed as noted.  +---------+---------------+---------+-----------+----------+-------+  RIGHT    CompressibilityPhasicitySpontaneityPropertiesSummary +---------+---------------+---------+-----------+----------+-------+ CFV      Full            Yes      Yes                          +---------+---------------+---------+-----------+----------+-------+ SFJ      Full                                                 +---------+---------------+---------+-----------+----------+-------+ FV Prox  Full           Yes      Yes                          +---------+---------------+---------+-----------+----------+-------+ FV Mid   Full                                                 +---------+---------------+---------+-----------+----------+-------+ FV DistalFull           Yes      Yes                          +---------+---------------+---------+-----------+----------+-------+ PFV      Full           Yes      Yes                          +---------+---------------+---------+-----------+----------+-------+ POP      Full           Yes      Yes                          +---------+---------------+---------+-----------+----------+-------+ PTV      Full                                                 +---------+---------------+---------+-----------+----------+-------+ PERO     Full                                                 +---------+---------------+---------+-----------+----------+-------+   Right Technical Findings: Rouleaux flow noted throughout the femoral and popliteal veins  +---------+---------------+---------+-----------+----------+-------+ LEFT     CompressibilityPhasicitySpontaneityPropertiesSummary +---------+---------------+---------+-----------+----------+-------+ CFV      Full           Yes      Yes                          +---------+---------------+---------+-----------+----------+-------+ SFJ      Full                                                 +---------+---------------+---------+-----------+----------+-------+ FV  Prox  Full           Yes      Yes                          +---------+---------------+---------+-----------+----------+-------+ FV Mid   Full                                                  +---------+---------------+---------+-----------+----------+-------+ FV DistalFull           Yes      Yes                          +---------+---------------+---------+-----------+----------+-------+ PFV      Full           Yes      Yes                          +---------+---------------+---------+-----------+----------+-------+ POP      Full           Yes      Yes                          +---------+---------------+---------+-----------+----------+-------+ PTV      Full                                                 +---------+---------------+---------+-----------+----------+-------+ PERO     Full                                                 +---------+---------------+---------+-----------+----------+-------+   Left Technical Findings: Rouleaux flow noted in the poplieal vein   Summary: Right: There is no evidence of deep vein thrombosis in the lower extremity. No cystic structure found in the popliteal fossa. See technical findings listed above. Left: There is no evidence of deep vein thrombosis in the lower extremity. No cystic structure found in the popliteal fossa. See technical findings listed above  *See table(s) above for measurements and observations.    Preliminary         This patient was seen with Dr. Burr Medico with my treatment plan reviewed with her. She expressed agreement with my medical management of this patient.  Addendum  I have seen the patient, examined him. I agree with the assessment and and plan and have edited the notes.   Mr. Barberi has developed more GI symptoms lately, some of them are concerning for carcinoid syndrome. Although he had excellent response to Lutatheria, he still has residual disease. I recommend him to restart Sandostatin injection Sandostatin injection, to see if his symptoms improve.  I will also get a restaging CT scan in 1 month to see if he has any disease progression.  We will  r symptom management.efer him to SW for his anxiety and depression, and community support.  I will see him back in a month with lab and injection.   [name]  05/15/2019

## 2019-05-15 NOTE — Telephone Encounter (Signed)
"  Ryan Cowan S/P bilateral venous doppler is negative for DVT.  Want to make providers aware of four episodes of burping and belching with me today he said started last Thursday when he went to ED."

## 2019-05-16 ENCOUNTER — Encounter: Payer: Self-pay | Admitting: General Practice

## 2019-05-16 ENCOUNTER — Telehealth: Payer: Self-pay | Admitting: General Practice

## 2019-05-16 NOTE — Progress Notes (Signed)
[place] CSW Progress Notes  Called [place], patient has Ryan Cowan as PCP and Horald Chestnut as his social worker [phone]).  Left VM for CSW Vivia Budge to determine resources available to patient at the [place].  Attempting to speak w patient directly in order to make referrals for other resources via [facility], [facility] and similar entities.  Sister states she will get patient on the phone this afternoon.  Edwyna Shell, LCSW Clinical Social Worker Phone:  [phone]

## 2019-05-16 NOTE — Progress Notes (Signed)
[place] CSW Progress Notes  Patient left [facility]om Management Clinic before CSW Wallis Bamberg was available to see him.  Undersigned attempted to contact patient by phone, no answer and no VM.  Will continue to try to contact by phone.  Also made CSW visit appt on patient's next [facility] visit on 8/20.  Edwyna Shell, LCSW Clinical Social Worker Phone:  [phone]

## 2019-05-16 NOTE — Telephone Encounter (Signed)
[place] CSW Progress Notes  CSW attempted to call patient's sister, [name].  Left VM on sister's cell, also spoke w her husband and requested return call.  Husband states "she is staying with him because he is not doing so well."  Awaiting return call.   Edwyna Shell, LCSW Clinical Social Worker Phone:  [phone] Cell:  [phone]

## 2019-05-16 NOTE — Progress Notes (Signed)
[place] CSW Progress Notes  Spoke w patient's sister, Jearld Pies, as CSW has been unable to reach patient.  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  Has few financial resources to contribute to costs of care in any facility. Patient has VA Benefits, "seems like they drag their feet, we cant get help from there."  Served 4 years in the [employer] in [place].    CSW will investigate options for care via the [place].  Can refer to [facility] to [facility] and/or [facility] if patient is agreeable.    Patient needs significant help in the home - cannot care for himself at all.  Does not like loud noises/TV, refuses to eat, "awful" mood/nervous and irritable, "notices all the details - always questions what he is taking and why.  Family caregivers are elderly also and feel they cannot continue to provide care for the patient in the home.  Edwyna Shell, LCSW Clinical Social Worker Phone:  [phone] Cell:  [phone]

## 2019-05-16 NOTE — Progress Notes (Signed)
[place] CSW Progress Notes  Spoke with [facility] - patient is not service connected.  May be eligible for in home care, but has not been seen by his PCP since March 2019.  Needs to reconnect w his PCP in order to be eligible for any in home services.  Is not eligible for VA funded SNF for assisted living placement.  He may be eligible for referral from his [place] PCP ([place]) for in home nonskilled home support/aide services and integrated behavioral health for his anxiety/depression.  CSW [place] will contact PCP CSW Horald Chestnut [phone]) w our requests.  Undersigned CSW will also speak w patient and family about necessity of establishing for care and having regular contact w the [place] PCP system in order for patient to receive full benefit from the [place] resources.  Spoke w patient and his sister - "we just cant get anyone to follow up on his appointments, we've been trying for a year to get scheduled there" - CSW will let Will refer to [facility] navigator for options for self pay support in the community.  Will email list of assisted living and family care homes to sister's email so family can discuss options.  Patient may be able to private pay for placement if facility is willing to accept his current retirement income.  Will touch base w patient on 8/19 when he returns to [facility] for care.  Edwyna Shell, LCSW Clinical Social Worker Phone:  [phone] Cell:  [phone]

## 2019-05-17 ENCOUNTER — Encounter: Payer: Self-pay | Admitting: General Practice

## 2019-05-17 NOTE — Progress Notes (Signed)
[place] CSW Progress Notes  Spoke w sister - gave information on how to access VA services.  States patient is looking at nursing home possibility, will ask for help if needed.  Sister wants to continue to drive patient to appointments, does not think referral to transportation coordinator is needed at this time.  Edwyna Shell, LCSW Clinical Social Worker Phone:  [phone]

## 2019-05-17 NOTE — Progress Notes (Signed)
[place] CSW Progress Notes  Call to sister Ryan Cowan to let her know information gained from [place] scheduling [phone]).  They have a PCP appt for him w new PCP, in person, on 11/3 at 9:30 AM.  This visit could be cancelled if COVID remains a concern at that time.  Patient can have a video visit w PCP sooner - but needs to enroll in the program which requires smartphone/computer access.  Patient also needs to update his information w the [place] to include his sister's email and phone number.  Per [place], he has been difficult to reach because there is no VM when the phone is not answered.  Spoke w patient, conveyed the above information, he asked that I speak w sister to work on these issues.  Still awaiting return call from [facility].  Emailed sister list of Adult and Family Care homes in [place].  Edwyna Shell, LCSW Clinical Social Worker Phone:  [phone] Cell:  [phone]

## 2019-05-20 ENCOUNTER — Telehealth: Payer: Self-pay | Admitting: Hematology

## 2019-05-20 NOTE — Telephone Encounter (Signed)
Called pt per 8/14 sch message - updated pt with appt date and time of new appts.

## 2019-05-22 ENCOUNTER — Inpatient Hospital Stay: Payer: Medicare Other | Admitting: General Practice

## 2019-05-22 ENCOUNTER — Inpatient Hospital Stay: Payer: Medicare Other

## 2019-05-22 ENCOUNTER — Other Ambulatory Visit: Payer: Self-pay

## 2019-05-22 VITALS — BP 129/78 | HR 56 | Temp 98.3°F | Resp 17

## 2019-05-22 DIAGNOSIS — D500 Iron deficiency anemia secondary to blood loss (chronic): Secondary | ICD-10-CM

## 2019-05-22 DIAGNOSIS — C7A029 Malignant carcinoid tumor of the large intestine, unspecified portion: Secondary | ICD-10-CM | POA: Diagnosis not present

## 2019-05-22 MED ORDER — OCTREOTIDE ACETATE 30 MG IM KIT
PACK | INTRAMUSCULAR | Status: AC
  Filled 2019-05-22: qty 1

## 2019-05-22 MED ORDER — OCTREOTIDE ACETATE 30 MG IM KIT
30.0000 mg | PACK | Freq: Once | INTRAMUSCULAR | Status: AC
  Administered 2019-05-22: 30 mg via INTRAMUSCULAR

## 2019-05-22 NOTE — Patient Instructions (Signed)

## 2019-05-22 NOTE — Progress Notes (Signed)
[place] CSW Progress Notes  Met w patient in person, sisters [name] and Diane participated by phone.  Discussed patient's ongoing anxiety issues - he finds little pleasure in daily activities "I cant even find anything I want to watch on TV."  Feels more comfortable with "someone else around", tends to become anxious when he is on his own.  Has limited his activities progressively over the past few months, now goes out less and less.  Does not drive at this point.  Reports anxiety over what used to be "normal", "I just don't like loud noises, when the dogs next door bark, I have to cover my ears."  Does not mow his lawn any more, has become increasingly restricted to staying inside.  Sisters are wearing out giving care, sisters are feeling stretched thin and want other options for patient care in home.  Discussed various options for in home care - [facility] and asking for a referral for in home care services, connecting with [facility] (family has already connected with admissions at [facility] and are being encouraged to pursue this option), investigating options for ALF or [facility] placement ([place] list given to patient to share with sisters).  Family has already been connected w Darden Amber, navigator w [employer] - she is mailing patient and sister list of resources for in home care.    Also discussed ways to manage anxiety.  Patient reports difficulty sleeping - nightmares/distressing thoughts.  Sister has reported that patient seems to have periods of nonbreathing ("she says she timed me, I didn't take a breath for almost 60 seconds).  Encouraged to discuss this concern w his PCP and explore need for sleep study.  Will refer patient to [facility] counseling intern for ongoing work on Sports administrator and coping strategies.  No further questions from patient and sister, written materials given at end of visit.  Edwyna Shell, LCSW Clinical Social Worker Phone:  [phone]

## 2019-06-04 NOTE — Progress Notes (Signed)
I spoke to Ryan Cowan and his sister, Ryan Cowan, by phone today.  Ryan Cowan helped me get in contact with the patient, who was not answering my calls.  Ryan Cowan currently lives alone and reported experiencing anxiety, depression, and isolation.  He stated the depression has decreased after the most recent increase in his Zoloft doseage, but that anxiety and isolation are still concerns. The patient has a recent history of panic attacks.  He is interested in starting telehealth counseling services.  Paperwork was mailed to his home address for signing before counseling services can begin.  His sisters are no longer staying with him, but provide ongoing support by phone. The patient also reports finding emotional support through his religious faith.

## 2019-06-06 ENCOUNTER — Ambulatory Visit: Payer: Medicare Other | Admitting: Nurse Practitioner

## 2019-06-12 ENCOUNTER — Encounter: Payer: Self-pay | Admitting: General Practice

## 2019-06-12 NOTE — Progress Notes (Signed)
[place] CSW [facility]og[facility] NOtes  Patient referred via [facility] for information on in home caregiving services to [facility] [employer].  She has spoken w sister [name], mailed them lists of options, family will choose from among available options if desired.  Edwyna Shell, LCSW Clinical Social Worker Phone:  [phone]

## 2019-06-17 ENCOUNTER — Encounter (HOSPITAL_COMMUNITY): Payer: Self-pay

## 2019-06-17 ENCOUNTER — Ambulatory Visit (HOSPITAL_COMMUNITY)
Admission: RE | Admit: 2019-06-17 | Discharge: 2019-06-17 | Disposition: A | Discharge status: Home / Self Care | Payer: Medicare Other | Source: Ambulatory Visit | Attending: Hematology | Admitting: Hematology

## 2019-06-17 ENCOUNTER — Other Ambulatory Visit: Payer: Self-pay

## 2019-06-17 DIAGNOSIS — C7A029 Malignant carcinoid tumor of the large intestine, unspecified portion: Secondary | ICD-10-CM | POA: Insufficient documentation

## 2019-06-17 HISTORY — DX: Secondary malignant neoplasm of bone: C79.51

## 2019-06-17 HISTORY — DX: Malignant neoplasm of colon, unspecified: C18.9

## 2019-06-17 MED ORDER — IOHEXOL 300 MG/ML  SOLN
100.0000 mL | Freq: Once | INTRAMUSCULAR | Status: AC | PRN
  Administered 2019-06-17: 15:00:00 100 mL via INTRAVENOUS

## 2019-06-17 MED ORDER — SODIUM CHLORIDE (PF) 0.9 % IJ SOLN
INTRAMUSCULAR | Status: AC
  Filled 2019-06-17: qty 50

## 2019-06-17 NOTE — Progress Notes (Signed)
[place]   [phone] [phone]   Clinic Follow up Note   Patient Care Team: Leonard Downing, MD as PCP - General (Family Medicine)  Date of Service:  06/21/2019  CHIEF COMPLAINT: F/u of Metastatic Carcinoid cancer  SUMMARY OF ONCOLOGIC HISTORY: Oncology History Overview Note  Carcinoid tumor of colon, malignant   Staging form: Colon and Rectum, AJCC 7th Edition     Clinical: Stage Unknown ([place], N2b, M1) - Unsigned     Carcinoid tumor of colon, malignant ([place])  02/16/2015 Imaging   CT abdomen and pelvis with contrast showed wall thickening of the cecum and terminal ileum high suspicious for cecal neoplasm. Extensive confluent ileal colic adenopathy extending chronically along the superior mesenteric vein, multiple hepatic metastasis   02/18/2015 Initial Diagnosis   Carcinoid tumor of colon, malignant, with liver metastasis   02/18/2015 Initial Biopsy   Liver biopsy showed metastatic low-grade neuroendocrine tumor (carcinoid). The tumor is positive for CD X2, CD 56, chromogranin, and synaptophysin.   03/05/2015 - 07/24/2018 Chemotherapy   Sandostatin 30 mg IM every 4 weeks. Due to disease progression regular monthly injections stopped after 07/24/18   03/06/2015 Imaging   CT chest showed no evidence of metastasis in the chest. 4.4 cm ascending aortic aneurysm.   09/09/2015 Imaging   Restaging CT abdomen and pelvis with contrast showed interval response of hepatic metastasis. Most are small and, some are stable. Slight interval decrease in mesenteric adenopathy and primary colon tumor.    12/06/2016 Imaging   CT CAP w contrast IMPRESSION: 1. Overall, the extent of metastatic disease to the liver and abdominal/retroperitoneal lymphatics is similar to the prior examination 08/19/2016. 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 2. Multiple small pulmonary  nodules appear unchanged in size, number and distribution, favored to be benign. 3. Aortic atherosclerosis, in addition to 2 vessel coronary artery disease. Assessment for potential risk factor modification, dietary therapy or pharmacologic therapy may be warranted, if clinically indicated. 4. In addition, there is dilatation of the aortic root (4.7 cm in diameter at the level of the sinuses of Valsalva) and ectasia of the ascending thoracic aorta measuring 4.4 cm in diameter. Recommend annual imaging followup by CTA or MRA. This recommendation follows 2010 ACCF/AHA/AATS/ACR/ASA/SCA/SCAI/SIR/STS/SVM Guidelines for the Diagnosis and Management of Patients with Thoracic Aortic Disease. Circulation. 2010; 121: I696-E952. 5. Trace volume of ascites. 6. Additional incidental findings, as above.    05/31/2017 Imaging   CT CAP W Contrast 05/31/17 IMPRESSION: 1. Overall the extent of metastatic disease to the liver and abdominal adenopathy is similar to previous exam from 12/06/2016. 2. 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 3. Stable small pulmonary nodules. 4.  Aortic Atherosclerosis (ICD10-I70.0). 5. Dilatation of the ascending thoracic aorta. Unchanged. Recommend annual imaging followup by CTA or MRA. This recommendation follows 2010 ACCF/AHA/AATS/ACR/ASA/SCA/SCAI/SIR/STS/SVM Guidelines for the Diagnosis and Management of Patients with Thoracic Aortic Disease. Circulation. 2010; 121: e266-e369 6. Trace ascites.   12/07/2017 Imaging   CT CAP W Contrast 12/07/17 IMPRESSION: Chest Impression: 1. No evidence of thoracic metastasis. 2. Stable ascending thoracic aortic aneurysm at 4.4 cm. Recommend attention on oncology surveillance. Abdomen / Pelvis Impression: 1. Mild interval increase in size of multifocal hepatic metastasis. 2. Large central small bowel mesenteric mass is unchanged 3. No bowel obstruction. 4. No  skeletal lesions    06/12/2018 Imaging   06/12/2018  CT AP IMPRESSION: Large mesenteric mass in the right mid abdomen, increased.  Multifocal hepatic metastases, increased.  Mild upper abdominal lymphadenopathy, mildly increased.  Masslike thickening involving the terminal ileum, suspicious for metastasis, similar to the prior. Additional nonspecific wall thickening involving multiple loops of small bowel in the pelvis.  Small volume pelvic ascites, mildly increased.   06/12/2018 Progression   IMPRESSION: Large mesenteric mass in the right mid abdomen, increased.  Multifocal hepatic metastases, increased.  Mild upper abdominal lymphadenopathy, mildly increased.  Masslike thickening involving the terminal ileum, suspicious for metastasis, similar to the prior. Additional nonspecific wall thickening involving multiple loops of small bowel in the pelvis.  Small volume pelvic ascites, mildly increased.   07/18/2018 PET scan   IMPRESSION: 1. Examination is positive for extensive neuroendocrine tumor receptor avid metastatic disease involving the liver, axial and appendicular skeleton, thoracic and abdominal lymph nodes. There is a dominant mass within the mesentery which is intensely radiotracer avid with a maximum dimension of 11.9 cm and an SUV max of 44.32. Within the distal small bowel loops there is a radiotracer avid lesion likely within the distal ileum. 2. When compared with CT from 06/13/2018 the tumor volume within the abdomen is not significantly changed in the interval. 3.  Aortic Atherosclerosis (ICD10-I70.0). 4. Coronary artery atherosclerotic calcifications 5. Nonobstructing left renal calculi   08/21/2018 - 02/05/2019 Chemotherapy   Lutathera treatment with Sandostatin injection 08/21/18, 10/16/18, 12/11/18, 02/05/19   03/12/2019 PET scan   IMPRESSION: 1. Marked interval positive response to peptide receptor radiotherapy. 2. Significant decrease in size and  radiotracer activity within liver metastasis. 3. Significant decrease in size and radiotracer activity of large peritoneal mass in RIGHT lower quadrant. 4. Decrease in size and radiotracer activity of retroperitoneal adenopathy in the upper abdomen. 5. Decrease in size and radiotracer activity or resolution of activity of the small skeletal metastasis.     05/22/2019 -  Chemotherapy   Monthly Sandostatin injections restarted on 05/22/19    06/17/2019 Imaging   CT AP W Contast  IMPRESSION: Multifocal hepatic metastases, unchanged from recent PET.   Mesenteric nodal metastasis in the right mid abdomen, unchanged from recent PET.   Stable wall thickening involving the distal ileum.   No focal osseous metastases are evident on CT.   Additional ancillary findings as above.        CURRENT THERAPY:  Monthly Sandostatin injections restarted on 05/22/19   INTERVAL HISTORY:  Ryan Cowan is here for a follow up Metastatic Carcinoid cancer. He presents to the clinic with his sister. He notes he saw PA Vann 2-3 weeks ago due to diarrhea and fatigue. He notes he is doing better lately. He still feels unsteady on his feet occasionally. He is getting more sleep as well with Zoloft. Now increased to 72m given by Dr. EArelia Sneddon He feels less anxious this week. His sister is looking for someone to stay with him when his sisters can't. He feels can live by himself at the moment and does not need home help. He notes he gets leg weakness when doing some activities at home. He feels he can still drive. He notes his diarrhea is occasional. He has been eating more protein with meat. He feels his diarrhea is related to what he eats. He denies blood in his stool.  He notes posterior neck pain especially at night. He has been taking aleve for it.    REVIEW OF SYSTEMS:   Constitutional: Denies fevers, chills or abnormal weight loss  Eyes: Denies blurriness of vision Ears, nose, mouth, throat, and face:  Denies mucositis or sore throat Respiratory: Denies cough, dyspnea or wheezes Cardiovascular: Denies palpitation, chest discomfort or lower extremity swelling Gastrointestinal:  Denies nausea, heartburn (+) Occasional diarrhea, based on what he eats.  Skin: Denies abnormal skin rashes MSK:(+) posterior neck pain  Lymphatics: Denies new lymphadenopathy or easy bruising Neurological:Denies numbness, tingling or new weaknesses (+) Intermittent leg weakness  Behavioral/Psych: Mood is stable, no new changes (+) Mood improved  All other systems were reviewed with the patient and are negative.  MEDICAL HISTORY:  Past Medical History:  Diagnosis Date  . Colon cancer ([place]) dx'd 02/2015  . Liver metastases ([place]) dx'd 02/2015  . Metastatic cancer to bone [facility]) dx'd 2019    SURGICAL HISTORY: Past Surgical History:  Procedure Laterality Date  . APPENDECTOMY    . KNEE SURGERY    . SHOULDER SURGERY      I have reviewed the social history and family history with the patient and they are unchanged from previous note.  ALLERGIES:  is allergic to codeine.  MEDICATIONS:  Current Outpatient Medications  Medication Sig Dispense Refill  . Multiple Vitamin (MULTIVITAMIN WITH MINERALS) TABS tablet Take 1 tablet by mouth daily. Centrum Silver 50 plus    . naproxen sodium (ANAPROX) 220 MG tablet Take 220 mg by mouth 2 (two) times daily as needed (pain).    Marland Kitchen OVER THE COUNTER MEDICATION Place 1 application into both nostrils daily as needed (congestion). Vicks inhaler stick    . sertraline (ZOLOFT) 100 MG tablet Take 50 mg by mouth daily. Taking 1/2 tablet     No current facility-administered medications for this visit.     PHYSICAL EXAMINATION: ECOG PERFORMANCE STATUS: 2 - Symptomatic, <50% confined to bed  Vitals:   06/21/19 1356  BP: (!) 147/85  Pulse: 62  Resp: 20  Temp: 98.7 F (37.1 C)  SpO2: 98%   Filed Weights   06/21/19 1356  Weight: 141 lb 4.8 oz (64.1 kg)    GENERAL:alert, no  distress and comfortable SKIN: skin color, texture, turgor are normal, no rashes or significant lesions EYES: normal, Conjunctiva are pink and non-injected, sclera clear  NECK: supple, thyroid normal size, non-tender, without nodularity LYMPH:  no palpable lymphadenopathy in the cervical, axillary  LUNGS: clear to auscultation and percussion with normal breathing effort HEART: regular rate & rhythm and no murmurs and no lower extremity edema ABDOMEN:abdomen soft, non-tender and normal bowel sounds Musculoskeletal:no cyanosis of digits and no clubbing  NEURO: alert & oriented x 3 with fluent speech, no focal motor/sensory deficits  LABORATORY DATA:  I have reviewed the data as listed CBC Latest Ref Rng & Units 06/21/2019 05/15/2019 05/09/2019  WBC 4.0 - 10.5 K/uL 2.9(L) 3.6(L) 3.1(L)  Hemoglobin 13.0 - 17.0 g/dL 10.9(L) 11.1(L) 11.3(L)  Hematocrit 39.0 - 52.0 % 31.0(L) 32.5(L) 33.2(L)  Platelets 150 - 400 K/uL 119(L) 117(L) 113(L)     CMP Latest Ref Rng & Units 06/21/2019 05/15/2019 05/09/2019  Glucose 70 - 99 mg/dL 107(H) 93 91  BUN 8 - 23 mg/dL 19 14 21   Creatinine 0.61 - 1.24 mg/dL 0.79 0.81 0.75  Sodium 135 - 145 mmol/L 138 138 140  Potassium 3.5 - 5.1 mmol/L 3.9 3.7 3.6  Chloride 98 - 111 mmol/L 103 101 104  CO2 22 - 32 mmol/L 28 26 26   Calcium 8.9 - 10.3 mg/dL 8.6(L) 9.1 8.9  Total Protein 6.5 - 8.1 g/dL 5.9(L) 6.2(L) -  Total Bilirubin  0.3 - 1.2 mg/dL 0.4 0.5 -  Alkaline Phos 38 - 126 U/L 66 77 -  AST 15 - 41 U/L 15 13(L) -  ALT 0 - 44 U/L 12 13 -      RADIOGRAPHIC STUDIES: I have personally reviewed the radiological images as listed and agreed with the findings in the report. No results found.   ASSESSMENT & PLAN:  Ryan Cowan is a 73 y.o. male with   1. 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 He was first treated with Sandostatin injections since 03/2015. He had disease progression in 9/2019with  bone mets in spine, pelvic bone and sternum. -He has completed 5 treatments of Lutathera with Dr. Leonia Reeves on 08/21/18-02/05/19.  -DOTATATE PET scan from 03/12/19 shows very good response to [place] treatment but not likely cured from this alone. We reviewed in detail again today.  -We restarted monthly Sandostatin injections on 05/22/19 to help his diarrhea, fatigue and overall severe anxiety.  -Symptoms and anxiety have improved on injection and Zoloft. Will continue.  -His CT AP from 06/17/19 shows liver and mesenteric nodal mets are stable since recent PET. No focal bone mets seen. I reviewed with him and his sister today.  -Labs reviewed, CBC and CMP WNL except Wbc 2.9, Hg 10.9, plt 119K, lymphocytes 0,4, BG 107, Ca 8.6, protein 5.9. Chromogranin A still pending. Overall adequate to proceed with Sandostatin injection today  -I discussed his mildly low blood counts have persisted due to Lutathera treatment. I discussed there is a small risk of developing pre leukemia after treatment. If this does not improve or worsens may obtain bone marrow biopsy for further evaluation.  -I reviewed his risk of infection with low blood counts. He is not interested in flu shot as they have made him weak in the past. I strongly encouraged him to watch for signs of infection.  -F/u in 2 months.   2. 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  -he developed aenmia again since Lutathera treatment -Hg10.9today (06/21/19), will check iron study on next visit     4. Possible right inguinal hernia  -Patient complains of having a "knot" in the groin area. -I have  previously dicussed with the patient and we have agreed to just continue watching the area as long as no pain is present. -Stable with no pain   5. Anxiety/Depression and fatigue  -Has been anxious lately. He had not wanted to stay home alone. His sisters would stay with him even overnight and required ED visit.  -He has spoken with our [place] [facility] currently, Managed by Dr. EArelia Sneddon -He was given information for spiritual counseling. I encouraged him to proceed with counseling.   6. Pancytopenia -Patient has developed mild pancytopenia since Lutathera treatment, probably related to the treatment and is diffuse bone metastasis -We also discussed the small possibility of MDS and leukemia after Lutathera therapy, will continue monitoring, and get BM biopsy if needed    Plan -Labs reviewed and adequate to proceed with Sandostatin injection today  -plan to  start Xgeva injection next month -start calcium and vit D -Lab and injection in 1 and 2 months -F/u in 2 months   No problem-specific Assessment & Plan notes found for this encounter.   No orders of the defined types were placed in this encounter.  All questions were answered. The patient knows to call the clinic with any problems, questions or concerns. No barriers to learning was detected. I spent 20 minutes counseling the patient face to face. The total time spent in the appointment was 25 minutes and more than 50% was on counseling and review of test results     [name], MD 06/21/2019   I, Joslyn Devon, am acting as scribe for [name], MD.   I have reviewed the above documentation for accuracy and completeness, and I agree with the above.

## 2019-06-21 ENCOUNTER — Inpatient Hospital Stay: Payer: Medicare Other | Attending: Hematology | Admitting: Hematology

## 2019-06-21 ENCOUNTER — Inpatient Hospital Stay: Payer: Medicare Other

## 2019-06-21 ENCOUNTER — Other Ambulatory Visit: Payer: Self-pay

## 2019-06-21 ENCOUNTER — Encounter: Payer: Self-pay | Admitting: Hematology

## 2019-06-21 VITALS — BP 147/85 | HR 62 | Temp 98.7°F | Resp 20 | Ht 73.0 in | Wt 141.3 lb

## 2019-06-21 DIAGNOSIS — D500 Iron deficiency anemia secondary to blood loss (chronic): Secondary | ICD-10-CM

## 2019-06-21 DIAGNOSIS — D61818 Other pancytopenia: Secondary | ICD-10-CM | POA: Diagnosis not present

## 2019-06-21 DIAGNOSIS — C7B03 Secondary carcinoid tumors of bone: Secondary | ICD-10-CM | POA: Insufficient documentation

## 2019-06-21 DIAGNOSIS — F419 Anxiety disorder, unspecified: Secondary | ICD-10-CM | POA: Diagnosis not present

## 2019-06-21 DIAGNOSIS — C7A029 Malignant carcinoid tumor of the large intestine, unspecified portion: Secondary | ICD-10-CM | POA: Diagnosis present

## 2019-06-21 DIAGNOSIS — C7B01 Secondary carcinoid tumors of distant lymph nodes: Secondary | ICD-10-CM | POA: Diagnosis not present

## 2019-06-21 DIAGNOSIS — Z79899 Other long term (current) drug therapy: Secondary | ICD-10-CM | POA: Insufficient documentation

## 2019-06-21 DIAGNOSIS — C7951 Secondary malignant neoplasm of bone: Secondary | ICD-10-CM | POA: Diagnosis not present

## 2019-06-21 DIAGNOSIS — C7B02 Secondary carcinoid tumors of liver: Secondary | ICD-10-CM | POA: Diagnosis not present

## 2019-06-21 DIAGNOSIS — R197 Diarrhea, unspecified: Secondary | ICD-10-CM | POA: Diagnosis not present

## 2019-06-21 DIAGNOSIS — D509 Iron deficiency anemia, unspecified: Secondary | ICD-10-CM | POA: Insufficient documentation

## 2019-06-21 LAB — COMPREHENSIVE METABOLIC PANEL
ALT: 12 U/L (ref 0–44)
AST: 15 U/L (ref 15–41)
Albumin: 3.6 g/dL (ref 3.5–5.0)
Alkaline Phosphatase: 66 U/L (ref 38–126)
Anion gap: 7 (ref 5–15)
BUN: 19 mg/dL (ref 8–23)
CO2: 28 mmol/L (ref 22–32)
Calcium: 8.6 mg/dL — ABNORMAL LOW (ref 8.9–10.3)
Chloride: 103 mmol/L (ref 98–111)
Creatinine, Ser: 0.79 mg/dL (ref 0.61–1.24)
GFR calc Af Amer: >60 mL/min (ref >60)
GFR calc non Af Amer: >60 mL/min (ref >60)
Glucose, Bld: 107 mg/dL — ABNORMAL HIGH (ref 70–99)
Potassium: 3.9 mmol/L (ref 3.5–5.1)
Sodium: 138 mmol/L (ref 135–145)
Total Bilirubin: 0.4 mg/dL (ref 0.3–1.2)
Total Protein: 5.9 g/dL — ABNORMAL LOW (ref 6.5–8.1)

## 2019-06-21 LAB — CBC WITH DIFFERENTIAL/PLATELET
Abs Immature Granulocytes: 0 10*3/uL (ref 0.00–0.07)
Basophils Absolute: 0 10*3/uL (ref 0.0–0.1)
Basophils Relative: 1 %
Eosinophils Absolute: 0.1 10*3/uL (ref 0.0–0.5)
Eosinophils Relative: 2 %
HCT: 31 % — ABNORMAL LOW (ref 39.0–52.0)
Hemoglobin: 10.9 g/dL — ABNORMAL LOW (ref 13.0–17.0)
Immature Granulocytes: 0 %
Lymphocytes Relative: 13 %
Lymphs Abs: 0.4 10*3/uL — ABNORMAL LOW (ref 0.7–4.0)
MCH: 37.2 pg — ABNORMAL HIGH (ref 26.0–34.0)
MCHC: 35.2 g/dL (ref 30.0–36.0)
MCV: 105.8 fL — ABNORMAL HIGH (ref 80.0–100.0)
Monocytes Absolute: 0.4 10*3/uL (ref 0.1–1.0)
Monocytes Relative: 14 %
Neutro Abs: 2.1 10*3/uL (ref 1.7–7.7)
Neutrophils Relative %: 70 %
Platelets: 119 10*3/uL — ABNORMAL LOW (ref 150–400)
RBC: 2.93 MIL/uL — ABNORMAL LOW (ref 4.22–5.81)
RDW: 12.2 % (ref 11.5–15.5)
WBC: 2.9 10*3/uL — ABNORMAL LOW (ref 4.0–10.5)
nRBC: 0 % (ref 0.0–0.2)

## 2019-06-21 MED ORDER — OCTREOTIDE ACETATE 30 MG IM KIT
PACK | INTRAMUSCULAR | Status: AC
  Filled 2019-06-21: qty 1

## 2019-06-21 MED ORDER — OCTREOTIDE ACETATE 30 MG IM KIT
30.0000 mg | PACK | Freq: Once | INTRAMUSCULAR | Status: AC
  Administered 2019-06-21: 15:00:00 30 mg via INTRAMUSCULAR

## 2019-06-22 ENCOUNTER — Encounter: Payer: Self-pay | Admitting: Hematology

## 2019-06-24 ENCOUNTER — Telehealth: Payer: Self-pay | Admitting: Hematology

## 2019-06-24 ENCOUNTER — Telehealth: Payer: Self-pay | Admitting: *Deleted

## 2019-06-24 LAB — CHROMOGRANIN A: Chromogranin A (ng/mL): 235.1 ng/mL — ABNORMAL HIGH (ref 0.0–101.8)

## 2019-06-24 NOTE — Progress Notes (Signed)
I spoke to the patient by phone today for our first telehealth counseling appointment.  Ryan Cowan found it helpful to have someone to talk to about how his cancer diagnosis and treatment are affecting him day to day.  The patient reported he experienced a period of better sleep after he started taking sertraline.  However, he also reporterd that the past two nights he did not sleep well.  He agreed to continue a regular nighttime routine, which includes taking the sertraline an hour before he intends to go to bed as directed by his PCP.  He stated that he needs to sleep in a recliner in order to stay propped up and because it is in a quieter room.  The patient did agree to try sleeping in his bed at least once this week.  [name] will keep a sleep log this week to document his sleep quality and to note any problems.  Additionally, because of his history of panic attacks and anxiety we discussed how to recognize anxiety or a panic attack and how to use deep breaths to help re-establish a calm state of mind.   Our next counseling appointment is scheduled for:  Monday July 01, 2019 at 11:00am (by phone).  Art Buff [place] Counseling Intern Voicemail:  [phone]

## 2019-06-24 NOTE — Telephone Encounter (Signed)
Received call from pt's sister to discuss pt instructions for Calcium/Vit D supplement. TCT Kemper Durie and spoke with her. Advised any of the OTC supplements with higher calcium and Vit D 3 amounts. Several brands suggested.  She voiced understanding and will pick up this today. Advised to follow suggestions for dosage on the the bottle label. She voiced understanding.

## 2019-06-24 NOTE — Telephone Encounter (Signed)
Scheduled appt per 9/18 los. ° °Printed and mailed appt calendar. °

## 2019-06-25 ENCOUNTER — Telehealth: Payer: Self-pay | Admitting: *Deleted

## 2019-06-25 NOTE — Telephone Encounter (Signed)
-----   Message from [name], MD sent at 06/25/2019  1:44 PM EDT ----- Please let pt know his tumor marker has dropped since he started sandostatin injection again, thanks   [employer]  06/25/2019

## 2019-06-25 NOTE — Telephone Encounter (Signed)
TCT pt's sister, Lauro Regulus regarding lab results from 06/21/19.  Spoke with her and informed that pt's tumor marker's are coming down after starting his sandostatin injections again. She voiced understanding.  Reviewed upcoming appts with her as well and she will inform pt of the above. No further questions or concerns

## 2019-06-28 ENCOUNTER — Inpatient Hospital Stay: Payer: Medicare Other

## 2019-06-28 DIAGNOSIS — C7A029 Malignant carcinoid tumor of the large intestine, unspecified portion: Secondary | ICD-10-CM

## 2019-07-01 NOTE — Progress Notes (Signed)
Spoke to pt by phone today for a counseling session. Pt reported both keeping the sleep log and continuing the bedtime routine as discussed last session.  Pt stated he has been taking his sertraline before bed and as a result has slept much better this past week as compared to the weeks prior.  Additionally, the pt noted that his anxiety has decreased. Pt stated that he is getting along "OK" and remarked that he has now lived on his own for a month, since his sisters stopped providing overnight care for him.  Pt has reservations about driving on his own because of lack of confidence in his "reaction time".  Pt reported that he sometimes feels weak after walking, but also recognized that he may need to get more exercise to build up his stamina.  Pt also reported occasional muscle tightness or soreness in his neck.  Pt stated he relies on his faith in God for strength and comfort.  Pt stated that his faith teaches him to see "every day as a good day".  Art Buff [place] Counseling Intern Voicemail:  [phone]

## 2019-07-04 LAB — 5 HIAA, QUANTITATIVE, URINE, 24 HOUR
5-HIAA, Ur: 7.5 mg/L
5-HIAA,Quant.,24 Hr Urine: 15 mg/24 hr — ABNORMAL HIGH (ref 0.0–14.9)
Total Volume: 2000

## 2019-07-08 ENCOUNTER — Telehealth: Payer: Self-pay | Admitting: *Deleted

## 2019-07-08 NOTE — Progress Notes (Signed)
Spoke to patient by phone on 07/08/2019 for a counseling appointment to provide emotional support and assist pt in making meaning from his cancer experience.  Pt stated he is doing "very well" and presented in a calm and friendly manner. Pt stated his sleep continues to improve.  Pt reported he increased his daily dosage of Sertraline to 100mg  per day and is now taking it at 9:00pm each night. Pt reported that he is "less bothered" by triggers that previously would have lead to anxious thoughts and rumination. Pt continues to report a positive perspective.  Pt continues to have reservations about driving,worrying about getting hit from behind and reported he drove several times this week for trips to the grocery store or the bank without incident. Pt goals are to continue decrease in anxiety by sufficient sleep and use of coping strategies.    Next Counseling Appt:  Monday, July 15, 2019 at 10:00am via phone     Art Buff [facility] Counseling Intern Voicemail:  [phone]

## 2019-07-08 NOTE — Telephone Encounter (Signed)
-----   Message from [name], MD sent at 07/07/2019 11:10 PM EDT ----- Let pt know his urine test on 9/25 showed slightly elevated tumor marker, but overall low level and stable from 6 months ago, better than last year. No concerns.  [name]  07/07/2019

## 2019-07-08 NOTE — Telephone Encounter (Signed)
Per Dr. Burr Medico, called pt in reference to slightly elevated tumor marker from urine test 9/25. Made aware overall low level and stable from 6 months ago. Pt verbalized understanding

## 2019-07-19 ENCOUNTER — Other Ambulatory Visit: Payer: Self-pay

## 2019-07-19 ENCOUNTER — Other Ambulatory Visit: Payer: Self-pay | Admitting: Hematology

## 2019-07-19 ENCOUNTER — Inpatient Hospital Stay: Payer: Medicare Other | Attending: Hematology

## 2019-07-19 VITALS — BP 112/68 | HR 60 | Temp 98.3°F | Resp 18

## 2019-07-19 DIAGNOSIS — D500 Iron deficiency anemia secondary to blood loss (chronic): Secondary | ICD-10-CM

## 2019-07-19 DIAGNOSIS — C7A029 Malignant carcinoid tumor of the large intestine, unspecified portion: Secondary | ICD-10-CM | POA: Diagnosis not present

## 2019-07-19 DIAGNOSIS — D509 Iron deficiency anemia, unspecified: Secondary | ICD-10-CM | POA: Insufficient documentation

## 2019-07-19 MED ORDER — OCTREOTIDE ACETATE 30 MG IM KIT
PACK | INTRAMUSCULAR | Status: AC
  Filled 2019-07-19: qty 1

## 2019-07-19 MED ORDER — OCTREOTIDE ACETATE 30 MG IM KIT
30.0000 mg | PACK | Freq: Once | INTRAMUSCULAR | Status: AC
  Administered 2019-07-19: 30 mg via INTRAMUSCULAR

## 2019-07-19 MED ORDER — DENOSUMAB 120 MG/1.7ML ~~LOC~~ SOLN: 120.0000 mg | Freq: Once | SUBCUTANEOUS | Status: DC

## 2019-07-19 NOTE — Patient Instructions (Signed)
Octreotide injection solution °What is this medicine? °OCTREOTIDE (ok TREE oh tide) is used to reduce blood levels of growth hormone in patients with a condition called acromegaly. This medicine also reduces flushing and watery diarrhea caused by certain types of cancer. °This medicine may be used for other purposes; ask your health care provider or pharmacist if you have questions. °COMMON BRAND NAME(S): Bynfezia, Sandostatin, Sandostatin LAR °What should I tell my health care provider before I take this medicine? °They need to know if you have any of these conditions: °· diabetes °· gallbladder disease °· kidney disease °· liver disease °· an unusual or allergic reaction to octreotide, other medicines, foods, dyes, or preservatives °· pregnant or trying to get pregnant °· breast-feeding °How should I use this medicine? °This medicine is for injection under the skin or into a vein (only in emergency situations). It is usually given by a health care professional in a hospital or clinic setting. °If you get this medicine at home, you will be taught how to prepare and give this medicine. Allow the injection solution to come to room temperature before use. Do not warm it artificially. Use exactly as directed. Take your medicine at regular intervals. Do not take your medicine more often than directed. °It is important that you put your used needles and syringes in a special sharps container. Do not put them in a trash can. If you do not have a sharps container, call your pharmacist or healthcare provider to get one. °Talk to your pediatrician regarding the use of this medicine in children. Special care may be needed. °Overdosage: If you think you have taken too much of this medicine contact a poison control center or emergency room at once. °NOTE: This medicine is only for you. Do not share this medicine with others. °What if I miss a dose? °If you miss a dose, take it as soon as you can. If it is almost time for your  next dose, take only that dose. Do not take double or extra doses. °What may interact with this medicine? °Do not take this medicine with any of the following medications: °· cisapride °· droperidol °· general anesthetics °· grepafloxacin °· perphenazine °· thioridazine °This medicine may also interact with the following medications: °· bromocriptine °· cyclosporine °· diuretics °· medicines for blood pressure, heart disease, irregular heart beat °· medicines for diabetes, including insulin °· quinidine °This list may not describe all possible interactions. Give your health care provider a list of all the medicines, herbs, non-prescription drugs, or dietary supplements you use. Also tell them if you smoke, drink alcohol, or use illegal drugs. Some items may interact with your medicine. °What should I watch for while using this medicine? °Visit your doctor or health care professional for regular checks on your progress. °To help reduce irritation at the injection site, use a different site for each injection and make sure the solution is at room temperature before use. °This medicine may cause decreases in blood sugar. Signs of low blood sugar include chills, cool, pale skin or cold sweats, drowsiness, extreme hunger, fast heartbeat, headache, nausea, nervousness or anxiety, shakiness, trembling, unsteadiness, tiredness, or weakness. Contact your doctor or health care professional right away if you experience any of these symptoms. °This medicine may increase blood sugar. Ask your healthcare provider if changes in diet or medicines are needed if you have diabetes. °This medicine may cause a decrease in vitamin B12. You should make sure that you get enough vitamin B12   while you are taking this medicine. Discuss the foods you eat and the vitamins you take with your health care professional. °What side effects may I notice from receiving this medicine? °Side effects that you should report to your doctor or health care  professional as soon as possible: °· allergic reactions like skin rash, itching or hives, swelling of the face, lips, or tongue °· decreases in blood sugar °· changes in heart rate °· severe stomach pain °·  °signs and symptoms of high blood sugar such as being more thirsty or hungry or having to urinate more than normal. You may also feel very tired or have blurry vision. °Side effects that usually do not require medical attention (report to your doctor or health care professional if they continue or are bothersome): °· diarrhea or constipation °· gas or stomach pain °· nausea, vomiting °· pain, redness, swelling and irritation at site where injected °This list may not describe all possible side effects. Call your doctor for medical advice about side effects. You may report side effects to FDA at 1-800-FDA-1088. °Where should I keep my medicine? °Keep out of the reach of children. °Store in a refrigerator between 2 and 8 degrees C (36 and 46 degrees F). Protect from light. Allow to come to room temperature naturally. Do not use artificial heat. If protected from light, the injection may be stored at room temperature between 20 and 30 degrees C (70 and 86 degrees F) for 14 days. After the initial use, throw away any unused portion of a multiple dose vial after 14 days. Throw away unused portions of the ampules after use. °NOTE: This sheet is a summary. It may not cover all possible information. If you have questions about this medicine, talk to your doctor, pharmacist, or health care provider. °© 2020 [employer] (2018-06-28 08:07:09) ° °

## 2019-07-19 NOTE — Progress Notes (Signed)
Per Dr Melodie Bouillon pt only to get Sandostatin INJ today

## 2019-07-22 NOTE — Progress Notes (Signed)
Counseling Intern Notes:  Spoke with the pt on 07/22/2019 via phone for a counseling session.  Pt stated he is "dragging" today and that he easily becomes out of breath. I noted that the pt's voice and verbal expression sounded less energetic than previous sessions but as we spoke he began to sound more awake and his thoughts were lucid. Pt reported the low energy level and fatigue has been present since his infusion on 07/19/2019. Pt reported feeling bored over the weekend, but had no interest in activities that could occupy his time such as a jigsaw puzzle.  Pt reported continued daily use of the Sertraline as prescribed. Pt reported that he sees that some days are not as good as others, but that "every day is a good day". Pt stated he does want to continue to schedule the counseling appointment on Monday mornings because it helps to motivate him to get up. Pt stated that when he is feeling frail he keeps his phone with him in case he needs to call for help and he intends to continue to take it easy for the next few days or until his energy level increases.  Pt reports that his sisters continue to offer support as needed. Pt stated that he has no risk of SI and that he is grateful to God for every day.   Next Counseling Session Scheduled: Monday, July 29, 2019 at 10:00am via phone  [place] Counseling Intern Voicemail:  [phone]

## 2019-07-29 NOTE — Progress Notes (Signed)
Counseling Intern Notes:  Spoke to pt on 07/29/2019 for counseling appt via phone. Counselor provided pt space to process recent challenges related to his health. Pt reported improved mood since the last week, but stated his energy level is still low.  Pt stated he continues to rely on his relationship with God for comfort and strength as he faces these health challenges. Additionally, pt demonstrated his ability to positively re-frame his situation. Pt expressed gratitude for his life and continues to remind himself to see "every day as a good day, it's just that some days are just better than others".   [place] Counseling Intern Voicemail:  [phone]

## 2019-08-05 NOTE — Progress Notes (Signed)
Spoke to patient today for a counseling appointment via phone. Pt reports feeling physically and mentally better this week.  He reported that he was able to get out for a haircut and grocery shopping last week. Counselor provided space for pt to process thoughts about faith and spirituality. He reported feeling grateful for all he does have in his life (all of his basic needs are met) and recognized that these gifts far outweigh his struggles. Pt stated that when he is feeling down he knows that "getting in touch with God lifts his spirits every time". He continues to report great comfort and strength from his [place] faith. Counselor and pt discussed considering termination as his need for counselor support seems significantly diminished. Pt agreed that we will talk again in 2 weeks to determine together if his goals for counseling have been met and then can choose to end cousneling at that time.  Next Counseling Session:  August 19, 2019 at 1:00pm via phone  [place] Counseling Intern Voicemail:  [phone]

## 2019-08-14 NOTE — Progress Notes (Signed)
[place]   [phone] [phone]   Clinic Follow up Note   Patient Care Team: Leonard Downing, MD as PCP - General (Family Medicine)  Date of Service:  08/16/2019  CHIEF COMPLAINT:  F/u of Metastatic Carcinoid cancer  SUMMARY OF ONCOLOGIC HISTORY: Oncology History Overview Note  Carcinoid tumor of colon, malignant   Staging form: Colon and Rectum, AJCC 7th Edition     Clinical: Stage Unknown ([place], N2b, M1) - Unsigned     Carcinoid tumor of colon, malignant ([place])  02/16/2015 Imaging   CT abdomen and pelvis with contrast showed wall thickening of the cecum and terminal ileum high suspicious for cecal neoplasm. Extensive confluent ileal colic adenopathy extending chronically along the superior mesenteric vein, multiple hepatic metastasis   02/18/2015 Initial Diagnosis   Carcinoid tumor of colon, malignant, with liver metastasis   02/18/2015 Initial Biopsy   Liver biopsy showed metastatic low-grade neuroendocrine tumor (carcinoid). The tumor is positive for CD X2, CD 56, chromogranin, and synaptophysin.   03/05/2015 - 07/24/2018 Chemotherapy   Sandostatin 30 mg IM every 4 weeks. Due to disease progression regular monthly injections stopped after 07/24/18   03/06/2015 Imaging   CT chest showed no evidence of metastasis in the chest. 4.4 cm ascending aortic aneurysm.   09/09/2015 Imaging   Restaging CT abdomen and pelvis with contrast showed interval response of hepatic metastasis. Most are small and, some are stable. Slight interval decrease in mesenteric adenopathy and primary colon tumor.    12/06/2016 Imaging   CT CAP w contrast IMPRESSION: 1. Overall, the extent of metastatic disease to the liver and abdominal/retroperitoneal lymphatics is similar to the prior examination 08/19/2016. 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 2. Multiple small pulmonary  nodules appear unchanged in size, number and distribution, favored to be benign. 3. Aortic atherosclerosis, in addition to 2 vessel coronary artery disease. Assessment for potential risk factor modification, dietary therapy or pharmacologic therapy may be warranted, if clinically indicated. 4. In addition, there is dilatation of the aortic root (4.7 cm in diameter at the level of the sinuses of Valsalva) and ectasia of the ascending thoracic aorta measuring 4.4 cm in diameter. Recommend annual imaging followup by CTA or MRA. This recommendation follows 2010 ACCF/AHA/AATS/ACR/ASA/SCA/SCAI/SIR/STS/SVM Guidelines for the Diagnosis and Management of Patients with Thoracic Aortic Disease. Circulation. 2010; 121: [id number]. 5. Trace volume of ascites. 6. Additional incidental findings, as above.    05/31/2017 Imaging   CT CAP W Contrast 05/31/17 IMPRESSION: 1. Overall the extent of metastatic disease to the liver and abdominal adenopathy is similar to previous exam from 12/06/2016. 2. 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 3. Stable small pulmonary nodules. 4.  Aortic Atherosclerosis (ICD10-I70.0). 5. Dilatation of the ascending thoracic aorta. Unchanged. Recommend annual imaging followup by CTA or MRA. This recommendation follows 2010 ACCF/AHA/AATS/ACR/ASA/SCA/SCAI/SIR/STS/SVM Guidelines for the Diagnosis and Management of Patients with Thoracic Aortic Disease. Circulation. 2010; 121: [id number] 6. Trace ascites.   12/07/2017 Imaging   CT CAP W Contrast 12/07/17 IMPRESSION: Chest Impression: 1. No evidence of thoracic metastasis. 2. Stable ascending thoracic aortic aneurysm at 4.4 cm. Recommend attention on oncology surveillance. Abdomen / Pelvis Impression: 1. Mild interval increase in size of multifocal hepatic metastasis. 2. Large central small bowel mesenteric mass is unchanged 3. No bowel obstruction. 4. No  skeletal lesions    06/12/2018 Imaging  06/12/2018 CT AP IMPRESSION: Large mesenteric mass in the right mid abdomen, increased.  Multifocal hepatic metastases, increased.  Mild upper abdominal lymphadenopathy, mildly increased.  Masslike thickening involving the terminal ileum, suspicious for metastasis, similar to the prior. Additional nonspecific wall thickening involving multiple loops of small bowel in the pelvis.  Small volume pelvic ascites, mildly increased.   06/12/2018 Progression   IMPRESSION: Large mesenteric mass in the right mid abdomen, increased.  Multifocal hepatic metastases, increased.  Mild upper abdominal lymphadenopathy, mildly increased.  Masslike thickening involving the terminal ileum, suspicious for metastasis, similar to the prior. Additional nonspecific wall thickening involving multiple loops of small bowel in the pelvis.  Small volume pelvic ascites, mildly increased.   07/18/2018 PET scan   IMPRESSION: 1. Examination is positive for extensive neuroendocrine tumor receptor avid metastatic disease involving the liver, axial and appendicular skeleton, thoracic and abdominal lymph nodes. There is a dominant mass within the mesentery which is intensely radiotracer avid with a maximum dimension of 11.9 cm and an SUV max of 44.32. Within the distal small bowel loops there is a radiotracer avid lesion likely within the distal ileum. 2. When compared with CT from 06/13/2018 the tumor volume within the abdomen is not significantly changed in the interval. 3.  Aortic Atherosclerosis (ICD10-I70.0). 4. Coronary artery atherosclerotic calcifications 5. Nonobstructing left renal calculi   08/21/2018 - 02/05/2019 Chemotherapy   Lutathera treatment with Sandostatin injection 08/21/18, 10/16/18, 12/11/18, 02/05/19   03/12/2019 PET scan   IMPRESSION: 1. Marked interval positive response to peptide receptor radiotherapy. 2. Significant decrease in size and  radiotracer activity within liver metastasis. 3. Significant decrease in size and radiotracer activity of large peritoneal mass in RIGHT lower quadrant. 4. Decrease in size and radiotracer activity of retroperitoneal adenopathy in the upper abdomen. 5. Decrease in size and radiotracer activity or resolution of activity of the small skeletal metastasis.     05/22/2019 -  Chemotherapy   Monthly Sandostatin injections restarted on 05/22/19    06/17/2019 Imaging   CT AP W Contast  IMPRESSION: Multifocal hepatic metastases, unchanged from recent PET.   Mesenteric nodal metastasis in the right mid abdomen, unchanged from recent PET.   Stable wall thickening involving the distal ileum.   No focal osseous metastases are evident on CT.   Additional ancillary findings as above.        CURRENT THERAPY:  Monthly Sandostatin injections restarted on 05/22/19  INTERVAL HISTORY:  Axle P Mahon is here for a follow up of Metastatic Carcinod Tumor. He presents to the clinic with his sister.   Doing well overall, some fatigue, stable, able to function at home, but his sisters are looking some help for him  Had right low abdominal pain a few times, last a few minutes, resolve on its own Occasional diarrhea.  He has not felt much improvement since we restarted sandostatin injection 4 months ago. He came in with a wheelchair today, it was too long walk for him due to his fatigue.  The rest of review of system was negative.   MEDICAL HISTORY:  Past Medical History:  Diagnosis Date  . Colon cancer ([place]) dx'd 02/2015  . Liver metastases ([place]) dx'd 02/2015  . Metastatic cancer to bone [facility]) dx'd 2019    SURGICAL HISTORY: Past Surgical History:  Procedure Laterality Date  . APPENDECTOMY    . KNEE SURGERY    . SHOULDER SURGERY      I have reviewed the social history and family history with the  patient and they are unchanged from previous note.  ALLERGIES:  is allergic to codeine.   MEDICATIONS:  Current Outpatient Medications  Medication Sig Dispense Refill  . Multiple Vitamin (MULTIVITAMIN WITH MINERALS) TABS tablet Take 1 tablet by mouth daily. Centrum Silver 50 plus    . naproxen sodium (ANAPROX) 220 MG tablet Take 220 mg by mouth 2 (two) times daily as needed (pain).    Marland Kitchen OVER THE COUNTER MEDICATION Place 1 application into both nostrils daily as needed (congestion). Vicks inhaler stick    . sertraline (ZOLOFT) 100 MG tablet Take 50 mg by mouth daily. Taking 1/2 tablet     No current facility-administered medications for this visit.     PHYSICAL EXAMINATION: ECOG PERFORMANCE STATUS: 2 - Symptomatic, <50% confined to bed  Vitals:   08/16/19 1354  BP: 138/76  Pulse: (!) 57  Resp: 17  Temp: 98.7 F (37.1 C)  SpO2: 100%   Filed Weights   08/16/19 1354  Weight: 139 lb 4.8 oz (63.2 kg)    GENERAL:alert, no distress and comfortable SKIN: skin color, texture, turgor are normal, no rashes or significant lesions EYES: normal, Conjunctiva are pink and non-injected, sclera clear NECK: supple, thyroid normal size, non-tender, without nodularity LYMPH:  no palpable lymphadenopathy in the cervical, axillary  LUNGS: clear to auscultation and percussion with normal breathing effort HEART: regular rate & rhythm and no murmurs and no lower extremity edema ABDOMEN:abdomen soft, non-tender and normal bowel sounds Musculoskeletal:no cyanosis of digits and no clubbing  NEURO: alert & oriented x 3 with fluent speech, no focal motor/sensory deficits  LABORATORY DATA:  I have reviewed the data as listed CBC Latest Ref Rng & Units 08/16/2019 06/21/2019 05/15/2019  WBC 4.0 - 10.5 K/uL 3.3(L) 2.9(L) 3.6(L)  Hemoglobin 13.0 - 17.0 g/dL 11.0(L) 10.9(L) 11.1(L)  Hematocrit 39.0 - 52.0 % 32.0(L) 31.0(L) 32.5(L)  Platelets 150 - 400 K/uL 128(L) 119(L) 117(L)     CMP Latest Ref Rng & Units 06/21/2019 05/15/2019 05/09/2019  Glucose 70 - 99 mg/dL 107(H) 93 91  BUN 8 - 23 mg/dL 19  14 21   Creatinine 0.61 - 1.24 mg/dL 0.79 0.81 0.75  Sodium 135 - 145 mmol/L 138 138 140  Potassium 3.5 - 5.1 mmol/L 3.9 3.7 3.6  Chloride 98 - 111 mmol/L 103 101 104  CO2 22 - 32 mmol/L 28 26 26   Calcium 8.9 - 10.3 mg/dL 8.6(L) 9.1 8.9  Total Protein 6.5 - 8.1 g/dL 5.9(L) 6.2(L) -  Total Bilirubin 0.3 - 1.2 mg/dL 0.4 0.5 -  Alkaline Phos 38 - 126 U/L 66 77 -  AST 15 - 41 U/L 15 13(L) -  ALT 0 - 44 U/L 12 13 -      RADIOGRAPHIC STUDIES: I have personally reviewed the radiological images as listed and agreed with the findings in the report. No results found.   ASSESSMENT & PLAN:  Ryan Cowan is a 73 y.o. male with   1. 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 Hewas first treated with Sandostatin injections since 03/2015. He had disease progression in 9/2019with bone mets in spine, pelvic bone and sternum. -Hehas completed 5 treatments of Lutatherawith Dr. Leonia Reeves on 08/21/18-02/05/19. -DOTATATE PET scan from 03/12/19 shows very good response to Heil treatment but unlikely cured from this alone .  -We restarted monthly Sandostatin injections on 05/22/19 to help his diarrhea, fatigue and overall severe anxiety.  -His symptoms have not improved much  except his depression and anxiety,which is likely related to his persistent high tumor burden. -I recommend him to try third line therapy Afinitor.  Potential benefit and side effects were discussed with patient in detail, especially mucositis, risk of infections, abnormal liver function, impaired wound healing, heart failure, cytopenias, gastritis, etc, he voiced good understanding and agrees to proceed. -Due to his cytopenias, will start him at 7.5mg  for first month  -f/u in a month  2. 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  -He developed anemia again since Lutathera treatment   4. Possible right inguinal hernia  -Patient complains of having a "knot" in the groin area. -I have previously dicussed with the patient and we have agreed to just continue watching the area as long as no pain is present. -Stable with no pain   5. Anxiety/Depression and fatigue  -Has been anxious lately. He had not wanted to stay home alone. His sisters would stay with him even overnight and required ED visit.  -He has spoken with our [place] 100mg  currently, Managed by Dr. Arelia Sneddon  -He will continue spiritual counseling.  -overall much improved   6. Pancytopenia -Patient has developed mild pancytopenia since Lutathera treatment, probably related to the treatment and is diffuse bone metastasis -We also discussed the small possibility of MDS and leukemia after Lutathera therapy, will continue monitoring, and get BM biopsy if needed    7.fatigue  -likely secondary to his underline malignancy  -I encouraged him to eat well, and consider physical therapy.  He is interested, I will refer him to PT  Plan -Due to his persistent fatigue, and lack of clinical benefit from Sandostatin, I will stop Sandostatin injection. -PT referral  -I will call in Afinitor 7.5 mg daily, he will start as soon as he receives medicine -Lab and follow-up in 2 months, will start Xgeva injection on next visit   No problem-specific Assessment & Plan notes found for this encounter.   No orders of the defined types were placed in this encounter.  All questions were answered. The patient knows to call the clinic with any problems, questions or concerns. No  barriers to learning was detected. I spent 25 minutes counseling the patient face to face. The total time spent in the appointment was 30 minutes and more than 50% was on counseling and review of test results     [name], MD 08/16/2019   I, Joslyn Devon, am acting as scribe for [name], MD.   I have reviewed the above documentation for accuracy and completeness, and I agree with the above.

## 2019-08-16 ENCOUNTER — Encounter: Payer: Self-pay | Admitting: Hematology

## 2019-08-16 ENCOUNTER — Other Ambulatory Visit: Payer: Self-pay

## 2019-08-16 ENCOUNTER — Inpatient Hospital Stay: Payer: Medicare Other | Attending: Hematology

## 2019-08-16 ENCOUNTER — Inpatient Hospital Stay: Payer: Medicare Other

## 2019-08-16 ENCOUNTER — Telehealth: Payer: Self-pay | Admitting: Hematology

## 2019-08-16 ENCOUNTER — Ambulatory Visit: Payer: Medicare Other

## 2019-08-16 ENCOUNTER — Inpatient Hospital Stay: Payer: Medicare Other | Admitting: Hematology

## 2019-08-16 VITALS — BP 138/76 | HR 57 | Temp 98.7°F | Resp 17 | Ht 73.0 in | Wt 139.3 lb

## 2019-08-16 DIAGNOSIS — D509 Iron deficiency anemia, unspecified: Secondary | ICD-10-CM | POA: Diagnosis not present

## 2019-08-16 DIAGNOSIS — C7A029 Malignant carcinoid tumor of the large intestine, unspecified portion: Secondary | ICD-10-CM

## 2019-08-16 DIAGNOSIS — Z85038 Personal history of other malignant neoplasm of large intestine: Secondary | ICD-10-CM | POA: Diagnosis not present

## 2019-08-16 DIAGNOSIS — Z79899 Other long term (current) drug therapy: Secondary | ICD-10-CM | POA: Diagnosis not present

## 2019-08-16 DIAGNOSIS — D61818 Other pancytopenia: Secondary | ICD-10-CM | POA: Diagnosis not present

## 2019-08-16 DIAGNOSIS — R197 Diarrhea, unspecified: Secondary | ICD-10-CM | POA: Diagnosis not present

## 2019-08-16 DIAGNOSIS — C787 Secondary malignant neoplasm of liver and intrahepatic bile duct: Secondary | ICD-10-CM | POA: Insufficient documentation

## 2019-08-16 DIAGNOSIS — C7951 Secondary malignant neoplasm of bone: Secondary | ICD-10-CM | POA: Diagnosis not present

## 2019-08-16 DIAGNOSIS — I2510 Atherosclerotic heart disease of native coronary artery without angina pectoris: Secondary | ICD-10-CM | POA: Diagnosis not present

## 2019-08-16 DIAGNOSIS — D500 Iron deficiency anemia secondary to blood loss (chronic): Secondary | ICD-10-CM

## 2019-08-16 DIAGNOSIS — Z9221 Personal history of antineoplastic chemotherapy: Secondary | ICD-10-CM | POA: Diagnosis not present

## 2019-08-16 DIAGNOSIS — R5383 Other fatigue: Secondary | ICD-10-CM | POA: Diagnosis not present

## 2019-08-16 DIAGNOSIS — F419 Anxiety disorder, unspecified: Secondary | ICD-10-CM | POA: Diagnosis not present

## 2019-08-16 DIAGNOSIS — C772 Secondary and unspecified malignant neoplasm of intra-abdominal lymph nodes: Secondary | ICD-10-CM | POA: Diagnosis not present

## 2019-08-16 LAB — CBC WITH DIFFERENTIAL/PLATELET
Abs Immature Granulocytes: 0.01 10*3/uL (ref 0.00–0.07)
Basophils Absolute: 0 10*3/uL (ref 0.0–0.1)
Basophils Relative: 1 %
Eosinophils Absolute: 0 10*3/uL (ref 0.0–0.5)
Eosinophils Relative: 1 %
HCT: 32 % — ABNORMAL LOW (ref 39.0–52.0)
Hemoglobin: 11 g/dL — ABNORMAL LOW (ref 13.0–17.0)
Immature Granulocytes: 0 %
Lymphocytes Relative: 15 %
Lymphs Abs: 0.5 10*3/uL — ABNORMAL LOW (ref 0.7–4.0)
MCH: 35.8 pg — ABNORMAL HIGH (ref 26.0–34.0)
MCHC: 34.4 g/dL (ref 30.0–36.0)
MCV: 104.2 fL — ABNORMAL HIGH (ref 80.0–100.0)
Monocytes Absolute: 0.4 10*3/uL (ref 0.1–1.0)
Monocytes Relative: 13 %
Neutro Abs: 2.3 10*3/uL (ref 1.7–7.7)
Neutrophils Relative %: 70 %
Platelets: 128 10*3/uL — ABNORMAL LOW (ref 150–400)
RBC: 3.07 MIL/uL — ABNORMAL LOW (ref 4.22–5.81)
RDW: 12.3 % (ref 11.5–15.5)
WBC: 3.3 10*3/uL — ABNORMAL LOW (ref 4.0–10.5)
nRBC: 0.6 % — ABNORMAL HIGH (ref 0.0–0.2)

## 2019-08-16 LAB — IRON AND TIBC
Iron: 105 ug/dL (ref 42–163)
Saturation Ratios: 39 % (ref 20–55)
TIBC: 268 ug/dL (ref 202–409)
UIBC: 163 ug/dL (ref 117–376)

## 2019-08-16 LAB — COMPREHENSIVE METABOLIC PANEL
ALT: 11 U/L (ref 0–44)
AST: 12 U/L — ABNORMAL LOW (ref 15–41)
Albumin: 3.5 g/dL (ref 3.5–5.0)
Alkaline Phosphatase: 69 U/L (ref 38–126)
Anion gap: 8 (ref 5–15)
BUN: 20 mg/dL (ref 8–23)
CO2: 28 mmol/L (ref 22–32)
Calcium: 8.8 mg/dL — ABNORMAL LOW (ref 8.9–10.3)
Chloride: 104 mmol/L (ref 98–111)
Creatinine, Ser: 0.75 mg/dL (ref 0.61–1.24)
GFR calc Af Amer: >60 mL/min (ref >60)
GFR calc non Af Amer: >60 mL/min (ref >60)
Glucose, Bld: 87 mg/dL (ref 70–99)
Potassium: 4.3 mmol/L (ref 3.5–5.1)
Sodium: 140 mmol/L (ref 135–145)
Total Bilirubin: 0.4 mg/dL (ref 0.3–1.2)
Total Protein: 5.9 g/dL — ABNORMAL LOW (ref 6.5–8.1)

## 2019-08-16 LAB — FERRITIN: Ferritin: 63 ng/mL (ref 24–336)

## 2019-08-16 LAB — VITAMIN B12: Vitamin B-12: 284 pg/mL (ref 180–914)

## 2019-08-16 LAB — VITAMIN D 25 HYDROXY (VIT D DEFICIENCY, FRACTURES): Vit D, 25-Hydroxy: 34.4 ng/mL (ref 30–100)

## 2019-08-16 MED ORDER — EVEROLIMUS 7.5 MG PO TABS: 7.5000 mg | ORAL_TABLET | Freq: Every day | ORAL | 0 refills | Status: DC

## 2019-08-16 NOTE — Telephone Encounter (Signed)
Scheduled per 11/13 los, patient received calender.

## 2019-08-19 ENCOUNTER — Telehealth: Payer: Self-pay | Admitting: Pharmacist

## 2019-08-19 DIAGNOSIS — D61818 Other pancytopenia: Secondary | ICD-10-CM

## 2019-08-19 NOTE — Telephone Encounter (Signed)
Oral Oncology Pharmacist [employer] authorization for Afinitor (everolimus) tablets submitted to [place] [employer] [place] Medicare HMO insurance on Cover My Meds  Key: [id number] Status: pending  This encounter will continue to be updated until final determination.  Johny Drilling, PharmD, BCPS, BCOP  08/19/2019   11:52 AM Oral Oncology Clinic [phone]

## 2019-08-19 NOTE — Telephone Encounter (Signed)
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  Afinitor is planned to be administered at 7.5mg  by mouth once daily. Dose may be escalated to target dose of 10 mg once daily based on benefit and toleration  Labs from 08/16/2019 assessed, OK for treatment initiation. Noted pltc=128k, will continue to be monitored during treatment BPs reviewed, most readings > ULN, patient will be informed about risk of hypertension and hypertensive crisis due to Afinitor  Current medication list in Epic reviewed, no DDIs with everolimus identified.  Prescription has been e-scribed to the [facility] for benefits analysis and approval on 08/16/2019 by MD.  Oral Oncology Clinic will continue to follow for insurance authorization, copayment issues, initial counseling and start date.  Johny Drilling, PharmD, BCPS, BCOP  08/19/2019 11:20 AM Oral Oncology Clinic [phone]

## 2019-08-19 NOTE — Progress Notes (Signed)
Counseling Intern Notes:  Spoke to the patient on 08/19/2019 for a counseling termination appointment via phone. Pt presented in a lively mood and expressed gratitude for our time together over the past 8 weeks. Counseling intern encouraged pt to recognize the progress and improvement he has made in his mental health during the last few months. Pt stated he recognizes that our counseling sessions provided a space for him to release all the stored negative energy and emotions, which led to his panic attack last August. Pt normalized his responses to difficult situations, stating that "many people have the same thoughts and feelings as I do". Pt reported he will continue to rely on his faith in God for strength to cope with negative emotions and for inspiration to create a positve reframe when facing challenges. Pt stated that his symptoms of anxiety and depression have decreased and he no longer feels he needs support though counseling. Counseling intern expressed agreement with the decision to terminate therapy. Pt stated he understands this is our last scheduled appointment and that he has my contact information at [facility] should he need to reach out for support in the future.   Art Buff Brown Counseling Intern Voicemail:  [phone]

## 2019-08-20 ENCOUNTER — Telehealth: Payer: Self-pay | Admitting: *Deleted

## 2019-08-20 NOTE — Telephone Encounter (Signed)
-----   Message from [name], MD sent at 08/20/2019  9:34 AM EST ----- Please let pt know his lab results, B12 on low end, I recommend him to take OTC B12 1011mcg daily, thanks   [name]  08/20/2019

## 2019-08-20 NOTE — Telephone Encounter (Signed)
Called pt & informed of low B-12 & recommended per DR Burr Medico to start OTC Vit B12 1000 mcg daily.  Pt expressed understanding.

## 2019-08-20 NOTE — Telephone Encounter (Signed)
Oral Oncology Pharmacist Encounter  Insurance authorization for Afinitor (everolimus) tablets submitted to [facility] HMO insurance on Cover My Meds  Key: [id number] Status: approved Effective dates: 08/19/2019 - 08/18/2020  Johny Drilling, PharmD, BCPS, BCOP  08/20/2019   8:30 AM Oral Oncology Clinic [phone]

## 2019-08-22 ENCOUNTER — Telehealth: Payer: Self-pay | Admitting: Pharmacist

## 2019-08-22 NOTE — Telephone Encounter (Signed)
Oral Oncology Pharmacist Encounter  I submitted an application for [employer] patient assistance application online in an effort to obtain Afinitor at $0 out of pocket cost.  Confirmation # [id number]  [employer] PANO phone number for follow-up: [phone]  Johny Drilling, PharmD, BCPS, BCOP  08/22/2019 2:00 PM Oral Oncology Clinic [phone]

## 2019-08-22 NOTE — Telephone Encounter (Signed)
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  Counseled patient on administration, dosing, side effects, monitoring, drug-food interactions, safe handling, storage, and disposal.  Patient will take Afinitor 7.5mg  tablets, 1 tablet by mouth once daily, with water, without regard to food.  Patient understands to take Afinitor consistently with regards to food and at approximately the same time each day.  Patient knows to aviod grapefruit or grapefruit juice while on therapy with Afinitor.  Afinitor start date: TBD, pending medication acquisition  Adverse effects include but are not limited to: mouth sores, GI upset, nausea, diarrhea, constipation, rash, increased blood sugars, decreased blood counts, increased blood pressure, pulmonary toxicity, and edema.   Reviewed with patient importance of keeping a medication schedule and plan for any missed doses.  Medication reconciliation performed and medication/allergy list updated.  Insurance authorization for Afinitor has been obtained. Test claim at the pharmacy revealed copayment $2832.10 for 1st fill of Afinitor. This is not affordable to patient.  I have assisted patient in submitting an application to [employer] patient assistance program. This will be updated in a separate encounter.  All questions answered.  Ryan Cowan voiced understanding and appreciation.   Patient knows to call the office with questions or concerns.  Ryan Cowan, PharmD, BCPS, BCOP  08/22/2019 1:44 PM Oral Oncology Clinic [phone]

## 2019-08-26 ENCOUNTER — Other Ambulatory Visit: Payer: Self-pay

## 2019-08-26 ENCOUNTER — Ambulatory Visit: Payer: Medicare Other | Attending: Hematology | Admitting: Rehabilitation

## 2019-08-26 ENCOUNTER — Encounter: Payer: Self-pay | Admitting: Rehabilitation

## 2019-08-26 DIAGNOSIS — R530 Neoplastic (malignant) related fatigue: Secondary | ICD-10-CM | POA: Insufficient documentation

## 2019-08-26 DIAGNOSIS — R2689 Other abnormalities of gait and mobility: Secondary | ICD-10-CM | POA: Diagnosis not present

## 2019-08-26 NOTE — Therapy (Signed)
[place], [place], [address] Phone: [phone]   Fax:  [phone]  Physical Therapy Evaluation  Patient Details  Name: Ryan Cowan MRN: [id number] Date of Birth: 1945-10-16 Referring Provider (PT): Dr. Burr Medico   Encounter Date: 08/26/2019  PT End of Session - 08/26/19 1715    Visit Number  1    Number of Visits  2    Date for PT Re-Evaluation  09/23/19    PT Start Time  1502    PT Stop Time  1559    PT Time Calculation (min)  57 min    Activity Tolerance  Patient tolerated treatment well    Behavior During Therapy  [facility] for tasks assessed/performed       Past Medical History:  Diagnosis Date  . Colon cancer ([place]) dx'd 02/2015  . Liver metastases ([place]) dx'd 02/2015  . Metastatic cancer to bone [facility]) dx'd 2019    Past Surgical History:  Procedure Laterality Date  . APPENDECTOMY    . KNEE SURGERY    . SHOULDER SURGERY      There were no vitals filed for this visit.   Subjective Assessment - 08/26/19 1505    Subjective  my balance, fatigue, weakness mainly in the Lower legs.    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  Disease progression showing extensive neuroendocrine tumor receptor avid metastatic disease involving the liver, axial and appendicular skeleton, thoracic and abdominal lymph nodes. There is a dominant mass within the mesentery. With chemotherapy again from 08/22/19-02/05/19.  PET scan on 03/12/19 showing positive response to therapy and stating "no focal osseous mets evident on CT". Other history includes treatment related anemia, dyspnea, malnutrition, and abdominal aortic anuerysm. Denies bone pain with ADLs.    Limitations  Walking;House hold activities;Standing    How long can you walk comfortably?  I can  walk to the mailbox and back    Diagnostic tests  see PET scan for most recent    Currently in Pain?  No/denies   occasional muscular neck pain        [facility] PT Assessment - 08/26/19 0001      Assessment   Medical Diagnosis  colon cancer    Referring Provider (PT)  Dr. Burr Medico    Onset Date/Surgical Date  02/01/15    Hand Dominance  Right    Prior Therapy  no      Precautions   Precaution Comments  bone mets      Restrictions   Weight Bearing Restrictions  No      Balance Screen   Has the patient fallen in the past 6 months  No    Has the patient had a decrease in activity level because of a fear of falling?   Yes    Is the patient reluctant to leave their home because of a fear of falling?   Yes      [place]  Private residence    Living Arrangements  Alone   sisters   Available Help at Discharge  Family   sisters   Type of [place] Access  Level entry    [place] - single point  Additional Comments  I can still take my trash and recycle out      Prior Function   Level of Independence  Independent with basic ADLs    Vocation  Retired    Leisure  watching tv all day      Cognition   Overall Cognitive Status  Within Functional Limits for tasks assessed      Coordination   Gross Motor Movements are Fluid and Coordinated  Yes      Functional Tests   Functional tests  Single leg stance;Sit to Stand      Sit to Stand   Comments  WNL      Posture/Postural Control   Posture/Postural Control  Postural limitations    Postural Limitations  Rounded Shoulders;Forward head;Increased thoracic kyphosis      Ambulation/Gait   Gait Comments  gait WNL today without LOB or sway      Standardized Balance Assessment   Standardized Balance Assessment  Five Times Sit to Stand;Timed Up and Go Test    Five times sit to stand comments   13.85 seconds no hand needed      Timed Up and Go Test   TUG   Normal TUG    Normal TUG (seconds)  8.81    TUG Comments  WNL for his age group                Objective measurements completed on examination: See above findings.              PT Education - 08/26/19 1715    Education Details  POC    Person(s) Educated  Patient   sister   Methods  Explanation    Comprehension  Verbalized understanding          PT Long Term Goals - 08/26/19 1721      PT LONG TERM GOAL #1   Title  Pt will be educated on the importance of activity in the home and be ind with a HEP    Time  1    Period  Weeks    Status  New             Plan - 08/26/19 1716    Clinical Impression Statement  Pt is a 73 year old male who has stage IV metastatic colon cancer diagnosed in 2016.  Pt has undergone chemotherapy in the past and was receiving injections every month.  He will now be switching to a pill form.  His PMH includes mets to the liver, axial skeleton, sternum and pelvis but they are now reported as not observable on the PET scan.  He experiences no bone pain during ADLs.  His examination today reveals WNL TUG, sit to stand and stationary balance when tested with eyes open and closed.  His biggest contributor seems to be his recent history of panic attacks that has decreased his activity as well as motivation to be active when living alone reporting he watches TV all day.  Due to finances the sister and pt request at home instruction.  Due to time restraints today they will return for one more visit for OTAGO instruction modified as needed for bone mets. Also discussed possible sleep apnea and how this can contribute to fatigue and to reach out again to PCP, and pts concerns about medications making him feel strange.    Personal Factors and Comorbidities  Behavior Pattern;Comorbidity 3+    Comorbidities  anxiety, fear, chemo history, bone mets  Examination-Activity Limitations  Locomotion Level    Examination-Participation Restrictions   Church;Yard Work    Stability/Clinical Decision Making  Stable/Uncomplicated    Designer, jewellery  Low    Rehab Potential  Excellent    PT Frequency  2x / week   2 visits   PT Duration  6 weeks   will ask for more visits if he decides to to PT   PT Treatment/Interventions  ADLs/Self Care Home Management;Patient/family education;Contrast Bath;Functional mobility training;Gait training;Neuromuscular re-education;Energy conservation    PT Next Visit Plan  instruct in [place]. any message back from Dr. Burr Medico.  Exercise and walking education    Consulted and Agree with Plan of Care  Patient       Patient will benefit from skilled therapeutic intervention in order to improve the following deficits and impairments:  Abnormal gait, Difficulty walking  Visit Diagnosis: Other abnormalities of gait and mobility  Neoplastic malignant related fatigue     Problem List Patient Active Problem List   Diagnosis Date Noted  . Carcinoid tumor of colon, malignant ([place]) 04/03/2015  . Iron deficiency anemia due to chronic blood loss 02/27/2015  . Protein-calorie malnutrition, severe ([place]) 02/17/2015  . Liver metastases ([place])   . Symptomatic anemia 02/16/2015  . DOE (dyspnea on exertion) 02/16/2015  . Thrombocytosis ([place]) 02/16/2015  . Chronic diarrhea 02/16/2015  . Weight loss, non-intentional 02/16/2015  . Former tobacco use 02/16/2015  . Hypoalbuminemia due to protein-calorie malnutrition ([place]) 02/16/2015    Stark Bray 08/26/2019, 5:23 PM  [place], [place], [address] Phone: [phone]   Fax:  [phone]  Name: Ryan Cowan MRN: [id number] Date of Birth: 15-May-1946

## 2019-08-26 NOTE — Telephone Encounter (Signed)
Oral Oncology Pharmacist Encounter  [employer] called to state that benefits verification stage is complete. The application will now continue through referral patient of patient to assessment for their assistance program. I was informed to wait 2-3 business days prior to follow-up on application status.  Johny Drilling, PharmD, BCPS, BCOP  08/26/2019 3:23 PM Oral Oncology Clinic [phone]

## 2019-08-27 NOTE — Telephone Encounter (Signed)
Oral Oncology Pharmacist Encounter  Received notification from [employer] patient assistance program that patient has been approved to receive Afinitor through their patient assistance program at $0 out of pocket cost to the patient.  Patient ID: [id number] Enrollment dates: 08/26/2019-10/03/2019 Phone number for assistance: [phone] Fax: [phone]  Afinitor prescriptions will come from [place] by [place] in [place].  I called patient and updated him on approval. I will start the re-enrollment process for calendar year 2021, which will be updated in a separate encounter.  Patient instructed to alert the office when he receives the Afinitor and to start it as soon as he gets it.  All questions answered. Patient knows to call the office for additional questions or concerns.  Johny Drilling, PharmD, BCPS, BCOP  08/27/2019 9:16 AM Oral Oncology Clinic [phone]

## 2019-09-05 ENCOUNTER — Encounter: Payer: Self-pay | Admitting: Rehabilitation

## 2019-09-05 ENCOUNTER — Other Ambulatory Visit: Payer: Self-pay

## 2019-09-05 ENCOUNTER — Ambulatory Visit: Payer: Medicare Other | Attending: Hematology | Admitting: Rehabilitation

## 2019-09-05 DIAGNOSIS — R530 Neoplastic (malignant) related fatigue: Secondary | ICD-10-CM | POA: Diagnosis present

## 2019-09-05 DIAGNOSIS — R2689 Other abnormalities of gait and mobility: Secondary | ICD-10-CM | POA: Diagnosis present

## 2019-09-05 NOTE — Therapy (Signed)
[place], [place], [address] Phone: [phone]   Fax:  [phone]  Physical Therapy Treatment  Patient Details  Name: Ryan Cowan MRN: [id number] Date of Birth: 11/13/1945 Referring Provider (PT): Dr. Burr Medico   Encounter Date: 09/05/2019  PT End of Session - 09/05/19 1447    Visit Number  2    Number of Visits  2    Date for PT Re-Evaluation  09/23/19    PT Start Time  8756    PT Stop Time  1441    PT Time Calculation (min)  38 min    Activity Tolerance  Patient tolerated treatment well    Behavior During Therapy  [facility] for tasks assessed/performed       Past Medical History:  Diagnosis Date  . Colon cancer ([place]) dx'd 02/2015  . Liver metastases ([place]) dx'd 02/2015  . 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 APPENDECTOMY    . KNEE SURGERY    . SHOULDER SURGERY      There were no vitals filed for this visit.  Subjective Assessment - 09/05/19 1404    Subjective  I am feeling good. 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  Disease progression showing extensive neuroendocrine tumor receptor avid metastatic disease involving the liver, axial and appendicular skeleton, thoracic and abdominal lymph nodes. There is a dominant mass within the mesentery. With chemotherapy again from 08/22/19-02/05/19.  PET scan on 03/12/19 showing positive response to therapy and stating "no focal osseous mets evident on CT". Other history includes treatment related anemia, dyspnea, malnutrition, and abdominal aortic anuerysm. Denies bone pain with ADLs.    Currently in Pain?  No/denies                       [facility] Adult  PT Treatment/Exercise - 09/05/19 0001      Exercises   Exercises  Other Exercises    Other Exercises   educated pt and sister on risk of exercise being no more than daily activity but that there is a slight risk when bone mets are present.  Pt aware.  Gave pt walking instruction, otago neck movements, LAQ, ankle pumps, standing hip abduction, standing ham curls, and toe raises all at the counter.  After performing these pt reports he feels a little lightheaded.  INstructed pt to perform these with supervision for at least the first few times at home and to have a chair around to sit if needed.                    PT Long Term Goals - 09/05/19 1449      PT LONG TERM GOAL #1   Title  Pt will be educated on the importance of activity in the home and be ind with a HEP    Status  Achieved            Plan - 09/05/19 1447    Clinical Impression Statement  Pt reports being more active at home now, he is doing some cleaning, walking, and is able to take the trash in and out.  He sometimes needs  to sit and rest and feels lightheaded occasionally.  Pt was encouraged to keep walking and was given some OTAGO TE today for him to perform with supervision until safety is demonstrated and sister agrees.  Overall pt is doing very well and just needs to continue to stay active.    PT Next Visit Plan  if pt returns increase endurance and activity tolerance    Consulted and Agree with Plan of Care  Patient;Family member/caregiver       Patient will benefit from skilled therapeutic intervention in order to improve the following deficits and impairments:     Visit Diagnosis: Neoplastic malignant related fatigue  Other abnormalities of gait and mobility     Problem List Patient Active Problem List   Diagnosis Date Noted  . Carcinoid tumor of colon, malignant ([place]) 04/03/2015  . Iron deficiency anemia due to chronic blood loss 02/27/2015  . Protein-calorie malnutrition, severe ([place])  02/17/2015  . Liver metastases ([place])   . Symptomatic anemia 02/16/2015  . DOE (dyspnea on exertion) 02/16/2015  . Thrombocytosis ([place]) 02/16/2015  . Chronic diarrhea 02/16/2015  . Weight loss, non-intentional 02/16/2015  . Former tobacco use 02/16/2015  . Hypoalbuminemia due to protein-calorie malnutrition ([place]) 02/16/2015    Stark Bray 09/05/2019, 2:50 PM  [place], [place], [address] Phone: [phone]   Fax:  [phone]  Name: Ryan Cowan MRN: [id number] Date of Birth: [birth year]  PHYSICAL THERAPY DISCHARGE SUMMARY  Visits from Start of Care: 2  Current functional level related to goals / functional outcomes: See above   Remaining deficits: Decreased endurance    Education / Equipment: HEP Plan: Patient agrees to discharge.  Patient goals were met. Patient is being discharged due to being pleased with the current functional level.  ?????     Shan Levans, PT

## 2019-09-06 ENCOUNTER — Telehealth: Payer: Self-pay | Admitting: Pharmacist

## 2019-09-06 NOTE — Telephone Encounter (Signed)
Oral Oncology Pharmacist Encounter  I submitted the patient portion of an application for [employer] patient assistance application online in an effort to obtain Afinitor at $0 out of pocket cost for calendar year 2021.  Confirmation # [id number]  Provider portion will be filled out, printed, left for MD signature then faxed to [facility] phone number for follow-up: [phone]  Johny Drilling, PharmD, BCPS, BCOP  09/06/2019 2:58 PM [facility] [phone]

## 2019-09-12 ENCOUNTER — Telehealth: Payer: Self-pay | Admitting: Pharmacy Technician

## 2019-09-12 NOTE — Telephone Encounter (Signed)
Oral Oncology Patient Advocate Encounter  Received a fax from [employer] requesting PA approval document on 09/10/2019.  Faxed approval letter to [facility] on 09/10/2019.    Called today, 09/12/2019, to make sure future shipments were not shipped to the office.  Representative stated that Rob had verified the shipping address for the medication to be delivered.  I called the patient to let him know the medication had been shipped to the office and to call [employer] to schedule next refill around 09/30/2019 and verify shipping address.  Representative is going to forward information they have on file to [employer] to start renewal application process.  [place] Patient [place] Phone [phone] Fax [phone] 09/12/2019 2:20 PM

## 2019-09-13 ENCOUNTER — Ambulatory Visit: Payer: Medicare Other

## 2019-09-13 ENCOUNTER — Other Ambulatory Visit: Payer: Medicare Other

## 2019-09-13 ENCOUNTER — Ambulatory Visit: Payer: Medicare Other | Admitting: Hematology

## 2019-09-16 ENCOUNTER — Telehealth: Payer: Self-pay | Admitting: Pharmacist

## 2019-09-16 NOTE — Progress Notes (Signed)
[place]   [phone] [phone]   Clinic Follow up Note   Patient Care Team: Leonard Downing, MD as PCP - General (Family Medicine)  Date of Service:  09/17/2019  CHIEF COMPLAINT: F/u of Metastatic Carcinoid cancer  SUMMARY OF ONCOLOGIC HISTORY: Oncology History Overview Note  Carcinoid tumor of colon, malignant   Staging form: Colon and Rectum, AJCC 7th Edition     Clinical: Stage Unknown ([place], N2b, M1) - Unsigned     Carcinoid tumor of colon, malignant ([place])  02/16/2015 Imaging   CT abdomen and pelvis with contrast showed wall thickening of the cecum and terminal ileum high suspicious for cecal neoplasm. Extensive confluent ileal colic adenopathy extending chronically along the superior mesenteric vein, multiple hepatic metastasis   02/18/2015 Initial Diagnosis   Carcinoid tumor of colon, malignant, with liver metastasis   02/18/2015 Initial Biopsy   Liver biopsy showed metastatic low-grade neuroendocrine tumor (carcinoid). The tumor is positive for CD X2, CD 56, chromogranin, and synaptophysin.   03/05/2015 - 07/24/2018 Chemotherapy   Sandostatin 30 mg IM every 4 weeks. Due to disease progression regular monthly injections stopped after 07/24/18   03/06/2015 Imaging   CT chest showed no evidence of metastasis in the chest. 4.4 cm ascending aortic aneurysm.   09/09/2015 Imaging   Restaging CT abdomen and pelvis with contrast showed interval response of hepatic metastasis. Most are small and, some are stable. Slight interval decrease in mesenteric adenopathy and primary colon tumor.    12/06/2016 Imaging   CT CAP w contrast IMPRESSION: 1. Overall, the extent of metastatic disease to the liver and abdominal/retroperitoneal lymphatics is similar to the prior examination 08/19/2016. 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 2. Multiple small pulmonary  nodules appear unchanged in size, number and distribution, favored to be benign. 3. Aortic atherosclerosis, in addition to 2 vessel coronary artery disease. Assessment for potential risk factor modification, dietary therapy or pharmacologic therapy may be warranted, if clinically indicated. 4. In addition, there is dilatation of the aortic root (4.7 cm in diameter at the level of the sinuses of Valsalva) and ectasia of the ascending thoracic aorta measuring 4.4 cm in diameter. Recommend annual imaging followup by CTA or MRA. This recommendation follows 2010 ACCF/AHA/AATS/ACR/ASA/SCA/SCAI/SIR/STS/SVM Guidelines for the Diagnosis and Management of Patients with Thoracic Aortic Disease. Circulation. 2010; 121: [id number]. 5. Trace volume of ascites. 6. Additional incidental findings, as above.    05/31/2017 Imaging   CT CAP W Contrast 05/31/17 IMPRESSION: 1. Overall the extent of metastatic disease to the liver and abdominal adenopathy is similar to previous exam from 12/06/2016. 2. 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 3. Stable small pulmonary nodules. 4.  Aortic Atherosclerosis (ICD10-I70.0). 5. Dilatation of the ascending thoracic aorta. Unchanged. Recommend annual imaging followup by CTA or MRA. This recommendation follows 2010 ACCF/AHA/AATS/ACR/ASA/SCA/SCAI/SIR/STS/SVM Guidelines for the Diagnosis and Management of Patients with Thoracic Aortic Disease. Circulation. 2010; 121: [id number] 6. Trace ascites.   12/07/2017 Imaging   CT CAP W Contrast 12/07/17 IMPRESSION: Chest Impression: 1. No evidence of thoracic metastasis. 2. Stable ascending thoracic aortic aneurysm at 4.4 cm. Recommend attention on oncology surveillance. Abdomen / Pelvis Impression: 1. Mild interval increase in size of multifocal hepatic metastasis. 2. Large central small bowel mesenteric mass is unchanged 3. No bowel obstruction. 4. No  skeletal lesions    06/12/2018 Imaging   06/12/2018  CT AP IMPRESSION: Large mesenteric mass in the right mid abdomen, increased.  Multifocal hepatic metastases, increased.  Mild upper abdominal lymphadenopathy, mildly increased.  Masslike thickening involving the terminal ileum, suspicious for metastasis, similar to the prior. Additional nonspecific wall thickening involving multiple loops of small bowel in the pelvis.  Small volume pelvic ascites, mildly increased.   06/12/2018 Progression   IMPRESSION: Large mesenteric mass in the right mid abdomen, increased.  Multifocal hepatic metastases, increased.  Mild upper abdominal lymphadenopathy, mildly increased.  Masslike thickening involving the terminal ileum, suspicious for metastasis, similar to the prior. Additional nonspecific wall thickening involving multiple loops of small bowel in the pelvis.  Small volume pelvic ascites, mildly increased.   07/18/2018 PET scan   IMPRESSION: 1. Examination is positive for extensive neuroendocrine tumor receptor avid metastatic disease involving the liver, axial and appendicular skeleton, thoracic and abdominal lymph nodes. There is a dominant mass within the mesentery which is intensely radiotracer avid with a maximum dimension of 11.9 cm and an SUV max of 44.32. Within the distal small bowel loops there is a radiotracer avid lesion likely within the distal ileum. 2. When compared with CT from 06/13/2018 the tumor volume within the abdomen is not significantly changed in the interval. 3.  Aortic Atherosclerosis (ICD10-I70.0). 4. Coronary artery atherosclerotic calcifications 5. Nonobstructing left renal calculi   08/21/2018 - 02/05/2019 Chemotherapy   Lutathera treatment with Sandostatin injection 08/21/18, 10/16/18, 12/11/18, 02/05/19   03/12/2019 PET scan   IMPRESSION: 1. Marked interval positive response to peptide receptor radiotherapy. 2. Significant decrease in size and  radiotracer activity within liver metastasis. 3. Significant decrease in size and radiotracer activity of large peritoneal mass in RIGHT lower quadrant. 4. Decrease in size and radiotracer activity of retroperitoneal adenopathy in the upper abdomen. 5. Decrease in size and radiotracer activity or resolution of activity of the small skeletal metastasis.     05/22/2019 - 07/19/2019 Chemotherapy   Monthly Sandostatin injections restarted on 05/22/19-07/19/19. Did not improve symptoms of diarrhea, fatigue and did not improve which is likely related to his persistent high tumor burden.    06/17/2019 Imaging   CT AP W Contast  IMPRESSION: Multifocal hepatic metastases, unchanged from recent PET.   Mesenteric nodal metastasis in the right mid abdomen, unchanged from recent PET.   Stable wall thickening involving the distal ileum.   No focal osseous metastases are evident on CT.   Additional ancillary findings as above.     09/17/2019 -  Chemotherapy   Third line therapy oral Afinitor 7.5mg  daily starting 09/17/19       CURRENT THERAPY:  Third line therapy oral Afinitor 7.5mg  daily starting 09/17/19  INTERVAL HISTORY:  Ryan Cowan is here for a follow up of Metastatic Carcinoid Tumor. He presents to the clinic with his family member. He notes he has corrected shipping information for his Afinitor so it can be shipped to his house. I reviewed hi medication list with him. He has been seen by PT and gave him mild exercises and encouraged him to walk more. He notes he had 3 BMs this morning with loose stool. This current bout has been going on for 2-3 weeks even not being on treatment. He notes RLQ abdominal pain occasionally. This pain only lasts a few seconds. He also notes back pain that only hurts when he walks a lot or in certain positions or activities.     REVIEW OF SYSTEMS:   Constitutional: Denies fevers, chills or abnormal weight loss Eyes:  Denies blurriness of  vision Ears, nose, mouth, throat, and face: Denies mucositis or sore throat Respiratory: Denies cough, dyspnea or wheezes Cardiovascular: Denies palpitation, chest discomfort or lower extremity swelling Gastrointestinal:  Denies nausea, heartburn (+) Diarrhea (+) RLQ abdominal pain occasionally. Skin: Denies abnormal skin rashes MSK: (+) Occasional back pain  Lymphatics: Denies new lymphadenopathy or easy bruising Neurological:Denies numbness, tingling or new weaknesses Behavioral/Psych: Mood is stable, no new changes  All other systems were reviewed with the patient and are negative.  MEDICAL HISTORY:  Past Medical History:  Diagnosis Date   Colon cancer ([place]) dx'd 02/2015   Liver metastases ([place]) dx'd 02/2015   Metastatic cancer to bone [facility]) dx'd 2019    SURGICAL HISTORY: Past Surgical History:  Procedure Laterality Date   APPENDECTOMY     KNEE SURGERY     SHOULDER SURGERY      I have reviewed the social history and family history with the patient and they are unchanged from previous note.  ALLERGIES:  is allergic to codeine.  MEDICATIONS:  Current Outpatient Medications  Medication Sig Dispense Refill   calcium-vitamin D (OSCAL WITH D) 500-200 MG-UNIT tablet Take 2 tablets by mouth daily with breakfast.     cyanocobalamin 2000 MCG tablet Take 1,000 mcg by mouth daily.     everolimus (AFINITOR) 7.5 MG tablet Take 1 tablet (7.5 mg total) by mouth daily. 30 tablet 0   Multiple Vitamin (MULTIVITAMIN WITH MINERALS) TABS tablet Take 1 tablet by mouth daily. Centrum Silver 50 plus     naproxen sodium (ANAPROX) 220 MG tablet Take 220 mg by mouth 2 (two) times daily as needed (pain).     OVER THE COUNTER MEDICATION Place 1 application into both nostrils daily as needed (congestion). Vicks inhaler stick     sertraline (ZOLOFT) 100 MG tablet Take 50 mg by mouth daily. Taking 1/2 tablet     No current facility-administered medications for this visit.    PHYSICAL  EXAMINATION: ECOG PERFORMANCE STATUS: 2 - Symptomatic, <50% confined to bed  Vitals:   09/17/19 1235  BP: 131/73  Pulse: 62  Resp: 18  Temp: 98.3 F (36.8 C)  SpO2: 94%   Filed Weights   09/17/19 1235  Weight: 144 lb 14.4 oz (65.7 kg)    GENERAL:alert, no distress and comfortable SKIN: skin color, texture, turgor are normal, no rashes or significant lesions EYES: normal, Conjunctiva are pink and non-injected, sclera clear  NECK: supple, thyroid normal size, non-tender, without nodularity LYMPH:  no palpable lymphadenopathy in the cervical, axillary  LUNGS: clear to auscultation and percussion with normal breathing effort HEART: regular rate & rhythm and no murmurs and no lower extremity edema ABDOMEN:abdomen soft, non-tender and normal bowel sounds Musculoskeletal:no cyanosis of digits and no clubbing  NEURO: alert & oriented x 3 with fluent speech, no focal motor/sensory deficits  LABORATORY DATA:  I have reviewed the data as listed CBC Latest Ref Rng & Units 09/17/2019 08/16/2019 06/21/2019  WBC 4.0 - 10.5 K/uL 4.1 3.3(L) 2.9(L)  Hemoglobin 13.0 - 17.0 g/dL 11.1(L) 11.0(L) 10.9(L)  Hematocrit 39.0 - 52.0 % 32.6(L) 32.0(L) 31.0(L)  Platelets 150 - 400 K/uL 135(L) 128(L) 119(L)     CMP Latest Ref Rng & Units 09/17/2019 08/16/2019 06/21/2019  Glucose 70 - 99 mg/dL 99 87 107(H)  BUN 8 - 23 mg/dL 19 20 19   Creatinine 0.61 - 1.24 mg/dL 0.79 0.75 0.79  Sodium 135 - 145 mmol/L 140 140 138  Potassium 3.5 - 5.1 mmol/L 4.0 4.3  3.9  Chloride 98 - 111 mmol/L 105 104 103  CO2 22 - 32 mmol/L 28 28 28   Calcium 8.9 - 10.3 mg/dL 9.0 8.8(L) 8.6(L)  Total Protein 6.5 - 8.1 g/dL 6.2(L) 5.9(L) 5.9(L)  Total Bilirubin 0.3 - 1.2 mg/dL 0.3 0.4 0.4  Alkaline Phos 38 - 126 U/L 79 69 66  AST 15 - 41 U/L 16 12(L) 15  ALT 0 - 44 U/L 13 11 12       RADIOGRAPHIC STUDIES: I have personally reviewed the radiological images as listed and agreed with the findings in the report. No results found.    ASSESSMENT & PLAN:  KLAY QUISENBERRY is a 73 y.o. male with   1. 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 Hewas first treated with Sandostatin injections since 03/2015. He had disease progression in 9/2019with bone mets in spine, pelvic bone and sternum. -Hehas completed 5 treatments of Lutatherawith Dr. Leonia Reeves on 08/21/18-02/05/19. -DOTATATE PET scan from 03/12/19 shows very good response to [place] treatment but unlikely cured from this alone .  -We restarted monthly Sandostatin injections on 05/22/19-07/19/19 but symptoms of diarrhea, fatigue and did not improve which is likely related to his persistent high tumor burden. -I recommended him to try third line therapy oral Afinitor 7.5mg  daily. I again reviewed the potential side effects with him today -Labs reviewed, his cytopenias has improved. MMA still pending. He will proceed with the start of Afinitor today (09/17/19). I encouraged him to take around the same times daily.  -His diarrhea and occasional RLQ pain is stable. Will monitor on Afinitor along with mouth sores. I encouraged him to use non-alcoholic mouthwash.  -I encouraged him to contact clinic with any concerning or significant side effects.  -F/u in 4 weeks. -if he tolerates Afinitor well, will increase to 10mg  from third month   2. Diarrhea, Fatigue, RLQ pain -These symptoms persisted even when off treatment. This is likely related to his carcinoid tumor.  -Will monitor on Afinitor.  -He can use imodium to help manage him diarrhea.  -He has seen PT who has instructed him on exercises and encouraged him to walk more.   3. Bone mets -first noticed on PET scan in 07/2018 -He is on oral calcium. Calcium level has normalized. Continue supplement.  -To reduce his risk of fracture will start monthly Xgeva in 10/2019. He is agreeable.  -He does have occasional back pain which he attributes to position and  certain activities like walking long distances.   4. 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 -He developed anemia again since Lutathera treatment. Stable lately.   5. Possible right inguinal hernia  -Patient complains of having a "knot" in the groin area. -I have previously dicussed with the patient and we have agreed to just continue watching the area as long as no pain is present. -Stable with no pain  6. Anxiety/Depressionand fatigue -Has been anxious lately. He had not wanted to stay home alone. His sisters would stay with him even overnight and required ED visit.  -On Zoloft 100mg  currently, Managed by Dr. Arelia Sneddon  -He will continue spiritual counseling and speaking with SW as needed.  -overall much improved   7. Pancytopenia -Patient has developed mild pancytopenia since Lutathera treatment,probably related to the treatment and is diffuse bone metastasis -We  also discussed the small possibility of MDS and leukemia after Lutathera therapy, will continue monitoring. -Has improved   Plan -Start Afinitor 7.5mg  daily tomorrow -Lab, f/u and Xgeva injection in 4 weeks    No problem-specific Assessment & Plan notes found for this encounter.   No orders of the defined types were placed in this encounter.  All questions were answered. The patient knows to call the clinic with any problems, questions or concerns. No barriers to learning was detected. I spent 20 minutes counseling the patient face to face. The total time spent in the appointment was 25 minutes and more than 50% was on counseling and review of test results     [name], MD 09/17/2019   I, Joslyn Devon, am acting as scribe for [name], MD.   I have reviewed the above documentation for accuracy and completeness, and I agree with the above.

## 2019-09-16 NOTE — Telephone Encounter (Signed)
Oral Chemotherapy Pharmacist Encounter   I was able to get in contact with Mr. Burghardt Friday 09/13/2019 to let him know that his Afinitor had been shipped to the cancer center instead of to his house. He has an appt on tomorrow 09/17/2019 with Dr. Burr Medico. He plans on picking up the medication then. I added the medication to Dr. Ernestina Penna desk RN today to hold onto for his appt tomorrow. Dr. Burr Medico also notified that the medication was at the desk to be given to the patient tomorrow.  Darl Pikes, PharmD, BCPS, [facility] Hematology/Oncology Clinical Pharmacist [facility]/AP Oral [facility] [phone]  09/16/2019 11:07 AM

## 2019-09-17 ENCOUNTER — Other Ambulatory Visit: Payer: Self-pay | Admitting: Hematology

## 2019-09-17 ENCOUNTER — Telehealth: Payer: Self-pay | Admitting: Hematology

## 2019-09-17 ENCOUNTER — Encounter: Payer: Self-pay | Admitting: Hematology

## 2019-09-17 ENCOUNTER — Inpatient Hospital Stay (HOSPITAL_BASED_OUTPATIENT_CLINIC_OR_DEPARTMENT_OTHER): Payer: Medicare Other | Admitting: Hematology

## 2019-09-17 ENCOUNTER — Inpatient Hospital Stay: Payer: Medicare Other | Attending: Hematology

## 2019-09-17 ENCOUNTER — Other Ambulatory Visit: Payer: Self-pay

## 2019-09-17 VITALS — BP 131/73 | HR 62 | Temp 98.3°F | Resp 18 | Ht 73.0 in | Wt 144.9 lb

## 2019-09-17 DIAGNOSIS — F419 Anxiety disorder, unspecified: Secondary | ICD-10-CM | POA: Insufficient documentation

## 2019-09-17 DIAGNOSIS — D509 Iron deficiency anemia, unspecified: Secondary | ICD-10-CM | POA: Diagnosis not present

## 2019-09-17 DIAGNOSIS — C7B02 Secondary carcinoid tumors of liver: Secondary | ICD-10-CM | POA: Insufficient documentation

## 2019-09-17 DIAGNOSIS — C7B03 Secondary carcinoid tumors of bone: Secondary | ICD-10-CM | POA: Diagnosis not present

## 2019-09-17 DIAGNOSIS — C7A029 Malignant carcinoid tumor of the large intestine, unspecified portion: Secondary | ICD-10-CM

## 2019-09-17 DIAGNOSIS — D61818 Other pancytopenia: Secondary | ICD-10-CM

## 2019-09-17 DIAGNOSIS — Z79899 Other long term (current) drug therapy: Secondary | ICD-10-CM | POA: Insufficient documentation

## 2019-09-17 DIAGNOSIS — C7B01 Secondary carcinoid tumors of distant lymph nodes: Secondary | ICD-10-CM | POA: Insufficient documentation

## 2019-09-17 LAB — COMPREHENSIVE METABOLIC PANEL
ALT: 13 U/L (ref 0–44)
AST: 16 U/L (ref 15–41)
Albumin: 3.6 g/dL (ref 3.5–5.0)
Alkaline Phosphatase: 79 U/L (ref 38–126)
Anion gap: 7 (ref 5–15)
BUN: 19 mg/dL (ref 8–23)
CO2: 28 mmol/L (ref 22–32)
Calcium: 9 mg/dL (ref 8.9–10.3)
Chloride: 105 mmol/L (ref 98–111)
Creatinine, Ser: 0.79 mg/dL (ref 0.61–1.24)
GFR calc Af Amer: >60 mL/min (ref >60)
GFR calc non Af Amer: >60 mL/min (ref >60)
Glucose, Bld: 99 mg/dL (ref 70–99)
Potassium: 4 mmol/L (ref 3.5–5.1)
Sodium: 140 mmol/L (ref 135–145)
Total Bilirubin: 0.3 mg/dL (ref 0.3–1.2)
Total Protein: 6.2 g/dL — ABNORMAL LOW (ref 6.5–8.1)

## 2019-09-17 LAB — CBC WITH DIFFERENTIAL/PLATELET
Abs Immature Granulocytes: 0.01 10*3/uL (ref 0.00–0.07)
Basophils Absolute: 0 10*3/uL (ref 0.0–0.1)
Basophils Relative: 1 %
Eosinophils Absolute: 0 10*3/uL (ref 0.0–0.5)
Eosinophils Relative: 1 %
HCT: 32.6 % — ABNORMAL LOW (ref 39.0–52.0)
Hemoglobin: 11.1 g/dL — ABNORMAL LOW (ref 13.0–17.0)
Immature Granulocytes: 0 %
Lymphocytes Relative: 12 %
Lymphs Abs: 0.5 10*3/uL — ABNORMAL LOW (ref 0.7–4.0)
MCH: 36 pg — ABNORMAL HIGH (ref 26.0–34.0)
MCHC: 34 g/dL (ref 30.0–36.0)
MCV: 105.8 fL — ABNORMAL HIGH (ref 80.0–100.0)
Monocytes Absolute: 0.5 10*3/uL (ref 0.1–1.0)
Monocytes Relative: 12 %
Neutro Abs: 3 10*3/uL (ref 1.7–7.7)
Neutrophils Relative %: 74 %
Platelets: 135 10*3/uL — ABNORMAL LOW (ref 150–400)
RBC: 3.08 MIL/uL — ABNORMAL LOW (ref 4.22–5.81)
RDW: 13 % (ref 11.5–15.5)
WBC: 4.1 10*3/uL (ref 4.0–10.5)
nRBC: 0 % (ref 0.0–0.2)

## 2019-09-17 NOTE — Telephone Encounter (Signed)
Scheduled appt per 12/15 los.  Printed calendar and avs. 

## 2019-09-20 LAB — METHYLMALONIC ACID, SERUM: Methylmalonic Acid, Quantitative: 181 nmol/L (ref 0–378)

## 2019-10-02 ENCOUNTER — Telehealth: Payer: Self-pay

## 2019-10-02 NOTE — Telephone Encounter (Signed)
Patient calls regarding Afinitor, has continued to have diarrhea, has not been taking the Imodium, instructed him to take the Imodium to manage his diarrhea.  He states he will start taking it.  Instructed him to call back if this does not control his diarrhea.  He verbalized an understanding.

## 2019-10-14 NOTE — Progress Notes (Signed)
[place]   [phone] [phone]   Clinic Follow up Note   Patient Care Team: Leonard Downing, MD as PCP - General (Family Medicine)  Date of Service:  10/17/2019  CHIEF COMPLAINT: F/u of Metastatic Carcinoid cancer  SUMMARY OF ONCOLOGIC HISTORY: Oncology History Overview Note  Carcinoid tumor of colon, malignant   Staging form: Colon and Rectum, AJCC 7th Edition     Clinical: Stage Unknown ([place], N2b, M1) - Unsigned     Carcinoid tumor of colon, malignant ([place])  02/16/2015 Imaging   CT abdomen and pelvis with contrast showed wall thickening of the cecum and terminal ileum high suspicious for cecal neoplasm. Extensive confluent ileal colic adenopathy extending chronically along the superior mesenteric vein, multiple hepatic metastasis   02/18/2015 Initial Diagnosis   Carcinoid tumor of colon, malignant, with liver metastasis   02/18/2015 Initial Biopsy   Liver biopsy showed metastatic low-grade neuroendocrine tumor (carcinoid). The tumor is positive for CD X2, CD 56, chromogranin, and synaptophysin.   03/05/2015 - 07/24/2018 Chemotherapy   Sandostatin 30 mg IM every 4 weeks. Due to disease progression regular monthly injections stopped after 07/24/18   03/06/2015 Imaging   CT chest showed no evidence of metastasis in the chest. 4.4 cm ascending aortic aneurysm.   09/09/2015 Imaging   Restaging CT abdomen and pelvis with contrast showed interval response of hepatic metastasis. Most are small and, some are stable. Slight interval decrease in mesenteric adenopathy and primary colon tumor.    12/06/2016 Imaging   CT CAP w contrast IMPRESSION: 1. Overall, the extent of metastatic disease to the liver and abdominal/retroperitoneal lymphatics is similar to the prior examination 08/19/2016. 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 2. Multiple small pulmonary  nodules appear unchanged in size, number and distribution, favored to be benign. 3. Aortic atherosclerosis, in addition to 2 vessel coronary artery disease. Assessment for potential risk factor modification, dietary therapy or pharmacologic therapy may be warranted, if clinically indicated. 4. In addition, there is dilatation of the aortic root (4.7 cm in diameter at the level of the sinuses of Valsalva) and ectasia of the ascending thoracic aorta measuring 4.4 cm in diameter. Recommend annual imaging followup by CTA or MRA. This recommendation follows 2010 ACCF/AHA/AATS/ACR/ASA/SCA/SCAI/SIR/STS/SVM Guidelines for the Diagnosis and Management of Patients with Thoracic Aortic Disease. Circulation. 2010; 121: [id number]. 5. Trace volume of ascites. 6. Additional incidental findings, as above.    05/31/2017 Imaging   CT CAP W Contrast 05/31/17 IMPRESSION: 1. Overall the extent of metastatic disease to the liver and abdominal adenopathy is similar to previous exam from 12/06/2016. 2. 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 3. Stable small pulmonary nodules. 4.  Aortic Atherosclerosis (ICD10-I70.0). 5. Dilatation of the ascending thoracic aorta. Unchanged. Recommend annual imaging followup by CTA or MRA. This recommendation follows 2010 ACCF/AHA/AATS/ACR/ASA/SCA/SCAI/SIR/STS/SVM Guidelines for the Diagnosis and Management of Patients with Thoracic Aortic Disease. Circulation. 2010; 121: [id number] 6. Trace ascites.   12/07/2017 Imaging   CT CAP W Contrast 12/07/17 IMPRESSION: Chest Impression: 1. No evidence of thoracic metastasis. 2. Stable ascending thoracic aortic aneurysm at 4.4 cm. Recommend attention on oncology surveillance. Abdomen / Pelvis Impression: 1. Mild interval increase in size of multifocal hepatic metastasis. 2. Large central small bowel mesenteric mass is unchanged 3. No bowel obstruction. 4. No  skeletal lesions    06/12/2018 Imaging   06/12/2018  CT AP IMPRESSION: Large mesenteric mass in the right mid abdomen, increased.  Multifocal hepatic metastases, increased.  Mild upper abdominal lymphadenopathy, mildly increased.  Masslike thickening involving the terminal ileum, suspicious for metastasis, similar to the prior. Additional nonspecific wall thickening involving multiple loops of small bowel in the pelvis.  Small volume pelvic ascites, mildly increased.   06/12/2018 Progression   IMPRESSION: Large mesenteric mass in the right mid abdomen, increased.  Multifocal hepatic metastases, increased.  Mild upper abdominal lymphadenopathy, mildly increased.  Masslike thickening involving the terminal ileum, suspicious for metastasis, similar to the prior. Additional nonspecific wall thickening involving multiple loops of small bowel in the pelvis.  Small volume pelvic ascites, mildly increased.   07/18/2018 PET scan   IMPRESSION: 1. Examination is positive for extensive neuroendocrine tumor receptor avid metastatic disease involving the liver, axial and appendicular skeleton, thoracic and abdominal lymph nodes. There is a dominant mass within the mesentery which is intensely radiotracer avid with a maximum dimension of 11.9 cm and an SUV max of 44.32. Within the distal small bowel loops there is a radiotracer avid lesion likely within the distal ileum. 2. When compared with CT from 06/13/2018 the tumor volume within the abdomen is not significantly changed in the interval. 3.  Aortic Atherosclerosis (ICD10-I70.0). 4. Coronary artery atherosclerotic calcifications 5. Nonobstructing left renal calculi   08/21/2018 - 02/05/2019 Chemotherapy   Lutathera treatment with Sandostatin injection 08/21/18, 10/16/18, 12/11/18, 02/05/19   03/12/2019 PET scan   IMPRESSION: 1. Marked interval positive response to peptide receptor radiotherapy. 2. Significant decrease in size and  radiotracer activity within liver metastasis. 3. Significant decrease in size and radiotracer activity of large peritoneal mass in RIGHT lower quadrant. 4. Decrease in size and radiotracer activity of retroperitoneal adenopathy in the upper abdomen. 5. Decrease in size and radiotracer activity or resolution of activity of the small skeletal metastasis.     05/22/2019 - 07/19/2019 Chemotherapy   Monthly Sandostatin injections restarted on 05/22/19-07/19/19. Did not improve symptoms of diarrhea, fatigue and did not improve which is likely related to his persistent high tumor burden.    06/17/2019 Imaging   CT AP W Contast  IMPRESSION: Multifocal hepatic metastases, unchanged from recent PET.   Mesenteric nodal metastasis in the right mid abdomen, unchanged from recent PET.   Stable wall thickening involving the distal ileum.   No focal osseous metastases are evident on CT.   Additional ancillary findings as above.     09/17/2019 -  Chemotherapy   Third line therapy oral Afinitor 7.5mg  daily starting 09/17/19       CURRENT THERAPY:  Third line therapy oral Afinitor 7.5mg  daily starting 09/17/19 Xgeva monthly starting 10/17/19  INTERVAL HISTORY:  Ryan Cowan is here for a follow up of treatment. She presents to the clinic with his sister. He notes he started Afinitor. He just received his second refill last week after first month completed. He notes he has tolerated well. He did have diarrhea with BM 3-5 times a day. He started liquid imodium but did not take much. He plans to get pill imodium to take more so he can take more to control his diarrhea. He is willing to increase dose but is concerned about constipation from this. He denies recent flushing. He notes 1 episode of dried blood when blowing his nose. He notes he is taking Calcium 2 tablets a day and D3.    REVIEW OF SYSTEMS:   Constitutional: Denies fevers, chills or abnormal weight loss Eyes:  Denies blurriness of  vision Ears, nose, mouth, throat, and face: Denies mucositis or sore throat Respiratory: Denies cough, dyspnea or wheezes Cardiovascular: Denies palpitation, chest discomfort or lower extremity swelling Gastrointestinal:  Denies nausea, heartburn (+) Diarrhea Skin: Denies abnormal skin rashes Lymphatics: Denies new lymphadenopathy or easy bruising Neurological:Denies numbness, tingling or new weaknesses Behavioral/Psych: Mood is stable, no new changes  All other systems were reviewed with the patient and are negative.  MEDICAL HISTORY:  Past Medical History:  Diagnosis Date  . Colon cancer ([place]) dx'd 02/2015  . Liver metastases ([place]) dx'd 02/2015  . Metastatic cancer to bone [facility]) dx'd 2019    SURGICAL HISTORY: Past Surgical History:  Procedure Laterality Date  . APPENDECTOMY    . KNEE SURGERY    . SHOULDER SURGERY      I have reviewed the social history and family history with the patient and they are unchanged from previous note.  ALLERGIES:  is allergic to codeine.  MEDICATIONS:  Current Outpatient Medications  Medication Sig Dispense Refill  . calcium-vitamin D (OSCAL WITH D) 500-200 MG-UNIT tablet Take 2 tablets by mouth daily with breakfast.    . cyanocobalamin 2000 MCG tablet Take 1,000 mcg by mouth daily.    Marland Kitchen everolimus (AFINITOR) 7.5 MG tablet Take 1 tablet (7.5 mg total) by mouth daily. 30 tablet 0  . Multiple Vitamin (MULTIVITAMIN WITH MINERALS) TABS tablet Take 1 tablet by mouth daily. Centrum Silver 50 plus    . naproxen sodium (ANAPROX) 220 MG tablet Take 220 mg by mouth 2 (two) times daily as needed (pain).    Marland Kitchen OVER THE COUNTER MEDICATION Place 1 application into both nostrils daily as needed (congestion). Vicks inhaler stick    . sertraline (ZOLOFT) 100 MG tablet Take 50 mg by mouth daily. Taking 1/2 tablet     No current facility-administered medications for this visit.    PHYSICAL EXAMINATION: ECOG PERFORMANCE STATUS: 1 - Symptomatic but completely  ambulatory  Vitals:   10/17/19 1358  BP: (!) 153/84  Pulse: (!) 54  Resp: 17  Temp: 98.7 F (37.1 C)  SpO2: 97%   Filed Weights   10/17/19 1358  Weight: 147 lb 8 oz (66.9 kg)    Due to COVID19 we will limit examination to appearance. Patient had no complaints.  GENERAL:alert, no distress and comfortable SKIN: skin color normal, no rashes or significant lesions EYES: normal, Conjunctiva are pink and non-injected, sclera clear  NEURO: alert & oriented x 3 with fluent speech   LABORATORY DATA:  I have reviewed the data as listed CBC Latest Ref Rng & Units 10/17/2019 09/17/2019 08/16/2019  WBC 4.0 - 10.5 K/uL 2.3(L) 4.1 3.3(L)  Hemoglobin 13.0 - 17.0 g/dL 11.1(L) 11.1(L) 11.0(L)  Hematocrit 39.0 - 52.0 % 32.2(L) 32.6(L) 32.0(L)  Platelets 150 - 400 K/uL 103(L) 135(L) 128(L)     CMP Latest Ref Rng & Units 10/17/2019 09/17/2019 08/16/2019  Glucose 70 - 99 mg/dL 87 99 87  BUN 8 - 23 mg/dL 16 19 20   Creatinine 0.61 - 1.24 mg/dL 0.77 0.79 0.75  Sodium 135 - 145 mmol/L 141 140 140  Potassium 3.5 - 5.1 mmol/L 4.0 4.0 4.3  Chloride 98 - 111 mmol/L 105 105 104  CO2 22 - 32 mmol/L 27 28 28   Calcium 8.9 - 10.3 mg/dL 8.7(L) 9.0 8.8(L)  Total Protein 6.5 - 8.1 g/dL 6.1(L) 6.2(L) 5.9(L)  Total Bilirubin 0.3 - 1.2 mg/dL 0.3 0.3 0.4  Alkaline Phos 38 - 126 U/L 86 79 69  AST 15 - 41 U/L 24 16 12(L)  ALT 0 - 44 U/L 24 13 11       RADIOGRAPHIC STUDIES: I have personally reviewed the radiological images as listed and agreed with the findings in the report. No results found.   ASSESSMENT & PLAN:  LANIE JACINTO is a 74 y.o. male with    1. 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 Hewas first treated with Sandostatin injections since 03/2015. He had disease progression in 9/2019with bone mets in spine, pelvic bone and sternum. -Hehas completed 5 treatments of Lutatherawith Dr. Leonia Reeves on  08/21/18-02/05/19. -DOTATATE PET scan from 03/12/19 shows very good response to [place] treatment butunlikely cured from this alone .  -We restarted monthly Sandostatin injections on 05/22/19-07/19/19 but symptoms of diarrhea, fatigue and did not improve which islikely related to his persistent high tumor burden. -I started him on third line therapy oral Afinitor 7.5mg  daily beginning 09/17/19.  -He has been tolerating Afinitor well. His diarrhea has mildly worsened on afinitor. We reviewed imodium use. If not enough I discussed restarting Sandostatin injection which controlled his diarrhea.  -Labs reviewed, WBC 2.3, Hg stable, plt 103K, ANC 1.5, Ca 8.7, Protein 6.1. Mild cytopenia could be related to previous Lutathera and current afinitor. Overall adequate to continue Afinitor at same 7.5mg  daily for second month. If he continues to tolerated Afinitor well, will increase to 10mg  from third month. Plan to repeat scan after 3-4 months.  -I encouraged him to increase protein in his diet.  -F/u in 4 weeks   2. Diarrhea, Fatigue, RLQ pain -These symptoms persisted even when off treatment. This is likely related to his carcinoid tumor.  -He has seen PT who has instructed him on exercises and encouraged him to walk more.  -Has mildly worsened on Afinitor.  -He has been taking imodium. He was encouraged to increase up to 8 tablets of imodium to control his diarrhea. I encouraged him to watch for constipation.   3. Bone mets -first noticed on PET scan in 07/2018 -To reduce his risk of fracture will start monthly Xgeva in today (10/17/19). I reviewed side effects of mild bone pain and possible jaw necrosis. He has full dentures in place, I encouraged him to watch for bone exposure.  -He will continue oral calcium and Vit D  4. 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 Hedeveloped anemiaagain since [place] treatment -He is not on oral iron supplement, but has been  treated with IV Feraheme before with complete response. -Stable lately.   5. Possible right inguinal hernia  -Patient complains of having a "knot" in the groin area. -I have previously dicussed with the patient and we have agreed to just continue watching the area as long as no pain is present. -Stable with no pain  6. Anxiety/Depressionand fatigue -Has been anxious lately. He had not wanted to stay home alone. His sisters would stay with him even overnight and required ED visit.  -On Zoloft100mg  currently, Managed by Dr. Arelia Sneddon -[place] continuespiritual counseling and speaking with SW as needed.  -overall much improved  7. Pancytopenia -Patient has developed mild pancytopenia since Lutathera treatment,probably related to the treatment and is diffuse bone metastasis -We also discussed the small possibility of MDS and leukemia after Lutathera therapy, will continue monitoring. -previously improved but worsened today. WBC 2.3, Hg 11.1, plt 103K, ANC 1.5 (10/17/19), likely related to Afinitor  Plan -Start Xgeva injection today and continue every month  -Continue Afinitor 7.5mg  daily for second month -Lab, f/u and Xgeva injection in 4 weeks    No problem-specific Assessment & Plan notes found for this encounter.   No orders of the defined types were placed in this encounter.  All questions were answered. The patient knows to call the clinic with any problems, questions or concerns. No barriers to learning was detected. The total time spent in the appointment was 30 minutes.     [name], MD 10/17/2019   I, Joslyn Devon, am acting as scribe for [name], MD.   I have reviewed the above documentation for accuracy and completeness, and I agree with the above.

## 2019-10-17 ENCOUNTER — Inpatient Hospital Stay: Payer: Medicare Other | Attending: Hematology

## 2019-10-17 ENCOUNTER — Telehealth: Payer: Self-pay | Admitting: Hematology

## 2019-10-17 ENCOUNTER — Inpatient Hospital Stay: Payer: Medicare Other

## 2019-10-17 ENCOUNTER — Encounter: Payer: Self-pay | Admitting: Hematology

## 2019-10-17 ENCOUNTER — Inpatient Hospital Stay (HOSPITAL_BASED_OUTPATIENT_CLINIC_OR_DEPARTMENT_OTHER): Payer: Medicare Other | Admitting: Hematology

## 2019-10-17 ENCOUNTER — Other Ambulatory Visit: Payer: Self-pay

## 2019-10-17 VITALS — BP 153/84 | HR 54 | Temp 98.7°F | Resp 17 | Ht 73.0 in | Wt 147.5 lb

## 2019-10-17 DIAGNOSIS — D509 Iron deficiency anemia, unspecified: Secondary | ICD-10-CM | POA: Diagnosis not present

## 2019-10-17 DIAGNOSIS — D61818 Other pancytopenia: Secondary | ICD-10-CM | POA: Diagnosis not present

## 2019-10-17 DIAGNOSIS — C772 Secondary and unspecified malignant neoplasm of intra-abdominal lymph nodes: Secondary | ICD-10-CM | POA: Insufficient documentation

## 2019-10-17 DIAGNOSIS — C787 Secondary malignant neoplasm of liver and intrahepatic bile duct: Secondary | ICD-10-CM | POA: Diagnosis not present

## 2019-10-17 DIAGNOSIS — C7A029 Malignant carcinoid tumor of the large intestine, unspecified portion: Secondary | ICD-10-CM | POA: Insufficient documentation

## 2019-10-17 DIAGNOSIS — R197 Diarrhea, unspecified: Secondary | ICD-10-CM | POA: Insufficient documentation

## 2019-10-17 DIAGNOSIS — Z85038 Personal history of other malignant neoplasm of large intestine: Secondary | ICD-10-CM | POA: Diagnosis not present

## 2019-10-17 DIAGNOSIS — I2510 Atherosclerotic heart disease of native coronary artery without angina pectoris: Secondary | ICD-10-CM | POA: Insufficient documentation

## 2019-10-17 DIAGNOSIS — C7951 Secondary malignant neoplasm of bone: Secondary | ICD-10-CM | POA: Diagnosis not present

## 2019-10-17 DIAGNOSIS — F419 Anxiety disorder, unspecified: Secondary | ICD-10-CM | POA: Insufficient documentation

## 2019-10-17 DIAGNOSIS — R1031 Right lower quadrant pain: Secondary | ICD-10-CM | POA: Diagnosis not present

## 2019-10-17 DIAGNOSIS — Z79899 Other long term (current) drug therapy: Secondary | ICD-10-CM | POA: Diagnosis not present

## 2019-10-17 DIAGNOSIS — D500 Iron deficiency anemia secondary to blood loss (chronic): Secondary | ICD-10-CM

## 2019-10-17 DIAGNOSIS — R5383 Other fatigue: Secondary | ICD-10-CM | POA: Insufficient documentation

## 2019-10-17 LAB — COMPREHENSIVE METABOLIC PANEL
ALT: 24 U/L (ref 0–44)
AST: 24 U/L (ref 15–41)
Albumin: 3.5 g/dL (ref 3.5–5.0)
Alkaline Phosphatase: 86 U/L (ref 38–126)
Anion gap: 9 (ref 5–15)
BUN: 16 mg/dL (ref 8–23)
CO2: 27 mmol/L (ref 22–32)
Calcium: 8.7 mg/dL — ABNORMAL LOW (ref 8.9–10.3)
Chloride: 105 mmol/L (ref 98–111)
Creatinine, Ser: 0.77 mg/dL (ref 0.61–1.24)
GFR calc Af Amer: >60 mL/min (ref >60)
GFR calc non Af Amer: >60 mL/min (ref >60)
Glucose, Bld: 87 mg/dL (ref 70–99)
Potassium: 4 mmol/L (ref 3.5–5.1)
Sodium: 141 mmol/L (ref 135–145)
Total Bilirubin: 0.3 mg/dL (ref 0.3–1.2)
Total Protein: 6.1 g/dL — ABNORMAL LOW (ref 6.5–8.1)

## 2019-10-17 LAB — CBC WITH DIFFERENTIAL/PLATELET
Abs Immature Granulocytes: 0 10*3/uL (ref 0.00–0.07)
Basophils Absolute: 0 10*3/uL (ref 0.0–0.1)
Basophils Relative: 1 %
Eosinophils Absolute: 0 10*3/uL (ref 0.0–0.5)
Eosinophils Relative: 1 %
HCT: 32.2 % — ABNORMAL LOW (ref 39.0–52.0)
Hemoglobin: 11.1 g/dL — ABNORMAL LOW (ref 13.0–17.0)
Immature Granulocytes: 0 %
Lymphocytes Relative: 17 %
Lymphs Abs: 0.4 10*3/uL — ABNORMAL LOW (ref 0.7–4.0)
MCH: 35.4 pg — ABNORMAL HIGH (ref 26.0–34.0)
MCHC: 34.5 g/dL (ref 30.0–36.0)
MCV: 102.5 fL — ABNORMAL HIGH (ref 80.0–100.0)
Monocytes Absolute: 0.4 10*3/uL (ref 0.1–1.0)
Monocytes Relative: 17 %
Neutro Abs: 1.5 10*3/uL — ABNORMAL LOW (ref 1.7–7.7)
Neutrophils Relative %: 64 %
Platelets: 103 10*3/uL — ABNORMAL LOW (ref 150–400)
RBC: 3.14 MIL/uL — ABNORMAL LOW (ref 4.22–5.81)
RDW: 11.6 % (ref 11.5–15.5)
WBC: 2.3 10*3/uL — ABNORMAL LOW (ref 4.0–10.5)
nRBC: 0 % (ref 0.0–0.2)

## 2019-10-17 MED ORDER — DENOSUMAB 120 MG/1.7ML ~~LOC~~ SOLN
SUBCUTANEOUS | Status: AC
  Filled 2019-10-17: qty 1.7

## 2019-10-17 MED ORDER — DENOSUMAB 120 MG/1.7ML ~~LOC~~ SOLN
120.0000 mg | Freq: Once | SUBCUTANEOUS | Status: AC
  Administered 2019-10-17: 120 mg via SUBCUTANEOUS

## 2019-10-17 NOTE — Telephone Encounter (Signed)
Gave avs and calendar ° °

## 2019-10-17 NOTE — Patient Instructions (Signed)
Denosumab injection What is this medicine? DENOSUMAB (den oh sue mab) slows bone breakdown. Prolia is used to treat osteoporosis in women after menopause and in men, and in people who are taking corticosteroids for 6 months or more. Xgeva is used to treat a high calcium level due to cancer and to prevent bone fractures and other bone problems caused by multiple myeloma or cancer bone metastases. Xgeva is also used to treat giant cell tumor of the bone. This medicine may be used for other purposes; ask your health care provider or pharmacist if you have questions. COMMON BRAND NAME(S): Prolia, XGEVA What should I tell my health care provider before I take this medicine? They need to know if you have any of these conditions:  dental disease  having surgery or tooth extraction  infection  kidney disease  low levels of calcium or Vitamin D in the blood  malnutrition  on hemodialysis  skin conditions or sensitivity  thyroid or parathyroid disease  an unusual reaction to denosumab, other medicines, foods, dyes, or preservatives  pregnant or trying to get pregnant  breast-feeding How should I use this medicine? This medicine is for injection under the skin. It is given by a health care professional in a hospital or clinic setting. A special MedGuide will be given to you before each treatment. Be sure to read this information carefully each time. For Prolia, talk to your pediatrician regarding the use of this medicine in children. Special care may be needed. For Xgeva, talk to your pediatrician regarding the use of this medicine in children. While this drug may be prescribed for children as young as 13 years for selected conditions, precautions do apply. Overdosage: If you think you have taken too much of this medicine contact a poison control center or emergency room at once. NOTE: This medicine is only for you. Do not share this medicine with others. What if I miss a dose? It is  important not to miss your dose. Call your doctor or health care professional if you are unable to keep an appointment. What may interact with this medicine? Do not take this medicine with any of the following medications:  other medicines containing denosumab This medicine may also interact with the following medications:  medicines that lower your chance of fighting infection  steroid medicines like prednisone or cortisone This list may not describe all possible interactions. Give your health care provider a list of all the medicines, herbs, non-prescription drugs, or dietary supplements you use. Also tell them if you smoke, drink alcohol, or use illegal drugs. Some items may interact with your medicine. What should I watch for while using this medicine? Visit your doctor or health care professional for regular checks on your progress. Your doctor or health care professional may order blood tests and other tests to see how you are doing. Call your doctor or health care professional for advice if you get a fever, chills or sore throat, or other symptoms of a cold or flu. Do not treat yourself. This drug may decrease your body's ability to fight infection. Try to avoid being around people who are sick. You should make sure you get enough calcium and vitamin D while you are taking this medicine, unless your doctor tells you not to. Discuss the foods you eat and the vitamins you take with your health care professional. See your dentist regularly. Brush and floss your teeth as directed. Before you have any dental work done, tell your dentist you are   receiving this medicine. Do not become pregnant while taking this medicine or for 5 months after stopping it. Talk with your doctor or health care professional about your birth control options while taking this medicine. Women should inform their doctor if they wish to become pregnant or think they might be pregnant. There is a potential for serious side  effects to an unborn child. Talk to your health care professional or pharmacist for more information. What side effects may I notice from receiving this medicine? Side effects that you should report to your doctor or health care professional as soon as possible:  allergic reactions like skin rash, itching or hives, swelling of the face, lips, or tongue  bone pain  breathing problems  dizziness  jaw pain, especially after dental work  redness, blistering, peeling of the skin  signs and symptoms of infection like fever or chills; cough; sore throat; pain or trouble passing urine  signs of low calcium like fast heartbeat, muscle cramps or muscle pain; pain, tingling, numbness in the hands or feet; seizures  unusual bleeding or bruising  unusually weak or tired Side effects that usually do not require medical attention (report to your doctor or health care professional if they continue or are bothersome):  constipation  diarrhea  headache  joint pain  loss of appetite  muscle pain  runny nose  tiredness  upset stomach This list may not describe all possible side effects. Call your doctor for medical advice about side effects. You may report side effects to FDA at 1-800-FDA-1088. Where should I keep my medicine? This medicine is only given in a clinic, doctor's office, or other health care setting and will not be stored at home. NOTE: This sheet is a summary. It may not cover all possible information. If you have questions about this medicine, talk to your doctor, pharmacist, or health care provider.  2020 Elsevier/Gold Standard (2018-01-26 16:10:44)

## 2019-10-17 NOTE — Progress Notes (Signed)
Ok to give xgeva today per Dr Burr Medico with calcium of 8.7

## 2019-10-18 ENCOUNTER — Encounter: Payer: Self-pay | Admitting: Hematology

## 2019-10-18 LAB — CHROMOGRANIN A: Chromogranin A (ng/mL): 452.5 ng/mL — ABNORMAL HIGH (ref 0.0–101.8)

## 2019-11-11 NOTE — Progress Notes (Signed)
[place]   [phone] [phone]   Clinic Follow up Note   Patient Care Team: Leonard Downing, MD as PCP - General (Family Medicine)  Date of Service:  11/18/2019  CHIEF COMPLAINT: F/u of Metastatic Carcinoid cancer  SUMMARY OF ONCOLOGIC HISTORY: Oncology History Overview Note  Carcinoid tumor of colon, malignant   Staging form: Colon and Rectum, AJCC 7th Edition     Clinical: Stage Unknown ([place], N2b, M1) - Unsigned     Carcinoid tumor of colon, malignant ([place])  02/16/2015 Imaging   CT abdomen and pelvis with contrast showed wall thickening of the cecum and terminal ileum high suspicious for cecal neoplasm. Extensive confluent ileal colic adenopathy extending chronically along the superior mesenteric vein, multiple hepatic metastasis   02/18/2015 Initial Diagnosis   Carcinoid tumor of colon, malignant, with liver metastasis   02/18/2015 Initial Biopsy   Liver biopsy showed metastatic low-grade neuroendocrine tumor (carcinoid). The tumor is positive for CD X2, CD 56, chromogranin, and synaptophysin.   03/05/2015 - 07/24/2018 Chemotherapy   Sandostatin 30 mg IM every 4 weeks. Due to disease progression regular monthly injections stopped after 07/24/18   03/06/2015 Imaging   CT chest showed no evidence of metastasis in the chest. 4.4 cm ascending aortic aneurysm.   09/09/2015 Imaging   Restaging CT abdomen and pelvis with contrast showed interval response of hepatic metastasis. Most are small and, some are stable. Slight interval decrease in mesenteric adenopathy and primary colon tumor.    12/06/2016 Imaging   CT CAP w contrast IMPRESSION: 1. Overall, the extent of metastatic disease to the liver and abdominal/retroperitoneal lymphatics is similar to the prior examination 08/19/2016. 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 2. Multiple small pulmonary  nodules appear unchanged in size, number and distribution, favored to be benign. 3. Aortic atherosclerosis, in addition to 2 vessel coronary artery disease. Assessment for potential risk factor modification, dietary therapy or pharmacologic therapy may be warranted, if clinically indicated. 4. In addition, there is dilatation of the aortic root (4.7 cm in diameter at the level of the sinuses of Valsalva) and ectasia of the ascending thoracic aorta measuring 4.4 cm in diameter. Recommend annual imaging followup by CTA or MRA. This recommendation follows 2010 ACCF/AHA/AATS/ACR/ASA/SCA/SCAI/SIR/STS/SVM Guidelines for the Diagnosis and Management of Patients with Thoracic Aortic Disease. Circulation. 2010; 121: [id number]. 5. Trace volume of ascites. 6. Additional incidental findings, as above.    05/31/2017 Imaging   CT CAP W Contrast 05/31/17 IMPRESSION: 1. Overall the extent of metastatic disease to the liver and abdominal adenopathy is similar to previous exam from 12/06/2016. 2. 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 3. Stable small pulmonary nodules. 4.  Aortic Atherosclerosis (ICD10-I70.0). 5. Dilatation of the ascending thoracic aorta. Unchanged. Recommend annual imaging followup by CTA or MRA. This recommendation follows 2010 ACCF/AHA/AATS/ACR/ASA/SCA/SCAI/SIR/STS/SVM Guidelines for the Diagnosis and Management of Patients with Thoracic Aortic Disease. Circulation. 2010; 121: [id number] 6. Trace ascites.   12/07/2017 Imaging   CT CAP W Contrast 12/07/17 IMPRESSION: Chest Impression: 1. No evidence of thoracic metastasis. 2. Stable ascending thoracic aortic aneurysm at 4.4 cm. Recommend attention on oncology surveillance. Abdomen / Pelvis Impression: 1. Mild interval increase in size of multifocal hepatic metastasis. 2. Large central small bowel mesenteric mass is unchanged 3. No bowel obstruction. 4. No  skeletal lesions    06/12/2018 Imaging   06/12/2018  CT AP IMPRESSION: Large mesenteric mass in the right mid abdomen, increased.  Multifocal hepatic metastases, increased.  Mild upper abdominal lymphadenopathy, mildly increased.  Masslike thickening involving the terminal ileum, suspicious for metastasis, similar to the prior. Additional nonspecific wall thickening involving multiple loops of small bowel in the pelvis.  Small volume pelvic ascites, mildly increased.   06/12/2018 Progression   IMPRESSION: Large mesenteric mass in the right mid abdomen, increased.  Multifocal hepatic metastases, increased.  Mild upper abdominal lymphadenopathy, mildly increased.  Masslike thickening involving the terminal ileum, suspicious for metastasis, similar to the prior. Additional nonspecific wall thickening involving multiple loops of small bowel in the pelvis.  Small volume pelvic ascites, mildly increased.   07/18/2018 PET scan   IMPRESSION: 1. Examination is positive for extensive neuroendocrine tumor receptor avid metastatic disease involving the liver, axial and appendicular skeleton, thoracic and abdominal lymph nodes. There is a dominant mass within the mesentery which is intensely radiotracer avid with a maximum dimension of 11.9 cm and an SUV max of 44.32. Within the distal small bowel loops there is a radiotracer avid lesion likely within the distal ileum. 2. When compared with CT from 06/13/2018 the tumor volume within the abdomen is not significantly changed in the interval. 3.  Aortic Atherosclerosis (ICD10-I70.0). 4. Coronary artery atherosclerotic calcifications 5. Nonobstructing left renal calculi   08/21/2018 - 02/05/2019 Chemotherapy   Lutathera treatment with Sandostatin injection 08/21/18, 10/16/18, 12/11/18, 02/05/19   03/12/2019 PET scan   IMPRESSION: 1. Marked interval positive response to peptide receptor radiotherapy. 2. Significant decrease in size and  radiotracer activity within liver metastasis. 3. Significant decrease in size and radiotracer activity of large peritoneal mass in RIGHT lower quadrant. 4. Decrease in size and radiotracer activity of retroperitoneal adenopathy in the upper abdomen. 5. Decrease in size and radiotracer activity or resolution of activity of the small skeletal metastasis.     05/22/2019 - 07/19/2019 Chemotherapy   Monthly Sandostatin injections restarted on 05/22/19-07/19/19. Did not improve symptoms of diarrhea, fatigue and did not improve which is likely related to his persistent high tumor burden.    06/17/2019 Imaging   CT AP W Contast  IMPRESSION: Multifocal hepatic metastases, unchanged from recent PET.   Mesenteric nodal metastasis in the right mid abdomen, unchanged from recent PET.   Stable wall thickening involving the distal ileum.   No focal osseous metastases are evident on CT.   Additional ancillary findings as above.     09/17/2019 -  Chemotherapy   Third line therapy oral Afinitor 7.5mg  daily starting 09/17/19       CURRENT THERAPY:  Third line therapyoralAfinitor7.5mg  dailystarting 09/17/19. Increased to full dose 10mg  on 11/18/19.  Xgeva monthly starting 10/17/19  INTERVAL HISTORY:  Ryan Cowan is here for a follow up of treatment. He presents to the clinic with his sister. He notes he is doing well and stable. He notes he is not very active at home but will do small house chores and go to grocery stores himself and drive. His anxiety has improved lately. He will occasionally exercise at home but notes this is not as much as he should.  He notes 2 small red spot in back of throat when he looks. He denies pain of throat or pain with swallowing. He notes he will clear his throat often. He notes no longer having skin buttock burning and pain. He notes he still has diarrhea 3-6 times a day. He still uses Imodium to help manage this.  He  notes 1 episode of diffuse upper chest  pain that lasted 10-15 minutes and resolved on its on. He was just sitting on cough. He denies SOB lately, only related to prior panic attacks. He continues to take Afinitor 7.5mg  daily. He notes he tolerated his Xgeva injection without bone pain.    REVIEW OF SYSTEMS:   Constitutional: Denies fevers, chills or abnormal weight loss Eyes: Denies blurriness of vision Ears, nose, mouth, throat, and face: Denies mucositis or sore throat (+) Throat erythema Respiratory: Denies cough, dyspnea or wheezes Cardiovascular: Denies palpitation or lower extremity swelling (+) 1 episode of chest pain Gastrointestinal:  Denies nausea, heartburn (+) Diarrhea  Skin: Denies abnormal skin rashes Lymphatics: Denies new lymphadenopathy or easy bruising Neurological:Denies numbness, tingling or new weaknesses Behavioral/Psych: Mood is stable, no new changes  All other systems were reviewed with the patient and are negative.  MEDICAL HISTORY:  Past Medical History:  Diagnosis Date  . Colon cancer ([place]) dx'd 02/2015  . Liver metastases ([place]) dx'd 02/2015  . Metastatic cancer to bone [facility]) dx'd 2019    SURGICAL HISTORY: Past Surgical History:  Procedure Laterality Date  . APPENDECTOMY    . KNEE SURGERY    . SHOULDER SURGERY      I have reviewed the social history and family history with the patient and they are unchanged from previous note.  ALLERGIES:  is allergic to codeine.  MEDICATIONS:  Current Outpatient Medications  Medication Sig Dispense Refill  . calcium-vitamin D (OSCAL WITH D) 500-200 MG-UNIT tablet Take 2 tablets by mouth daily with breakfast.    . cyanocobalamin 2000 MCG tablet Take 1,000 mcg by mouth daily.    . Multiple Vitamin (MULTIVITAMIN WITH MINERALS) TABS tablet Take 1 tablet by mouth daily. Centrum Silver 50 plus    . naproxen sodium (ANAPROX) 220 MG tablet Take 220 mg by mouth 2 (two) times daily as needed (pain).    Marland Kitchen OVER THE COUNTER MEDICATION Place 1 application into both  nostrils daily as needed (congestion). Vicks inhaler stick    . sertraline (ZOLOFT) 100 MG tablet Take 50 mg by mouth daily. Taking 1/2 tablet    . everolimus (AFINITOR) 10 MG tablet Take 1 tablet (10 mg total) by mouth daily. 30 tablet 0   No current facility-administered medications for this visit.    PHYSICAL EXAMINATION: ECOG PERFORMANCE STATUS: 1 - Symptomatic but completely ambulatory  Vitals:   11/18/19 1352  BP: (!) 163/90  Pulse: 65  Resp: 17  Temp: 97.9 F (36.6 C)  SpO2: 95%   Filed Weights   11/18/19 1352  Weight: 152 lb 1.6 oz (69 kg)    Due to COVID19 we will limit examination.  GENERAL:alert, no distress and comfortable SKIN: skin color normal, no rashes or significant lesions EYES: normal, Conjunctiva are pink and non-injected, sclera clear  NEURO: alert & oriented x 3 with fluent speech OROPHARYNX:no exudate, no erythema and lips, buccal mucosa, and tongue normal   LABORATORY DATA:  I have reviewed the data as listed CBC Latest Ref Rng & Units 11/18/2019 10/17/2019 09/17/2019  WBC 4.0 - 10.5 K/uL 3.0(L) 2.3(L) 4.1  Hemoglobin 13.0 - 17.0 g/dL 10.6(L) 11.1(L) 11.1(L)  Hematocrit 39.0 - 52.0 % 30.8(L) 32.2(L) 32.6(L)  Platelets 150 - 400 K/uL 93(L) 103(L) 135(L)     CMP Latest Ref Rng & Units 11/18/2019 10/17/2019 09/17/2019  Glucose 70 - 99 mg/dL 93 87 99  BUN 8 - 23 mg/dL 15 16 19   Creatinine 0.61 - 1.24  mg/dL 0.75 0.77 0.79  Sodium 135 - 145 mmol/L 141 141 140  Potassium 3.5 - 5.1 mmol/L 3.8 4.0 4.0  Chloride 98 - 111 mmol/L 109 105 105  CO2 22 - 32 mmol/L 26 27 28   Calcium 8.9 - 10.3 mg/dL 8.3(L) 8.7(L) 9.0  Total Protein 6.5 - 8.1 g/dL 6.1(L) 6.1(L) 6.2(L)  Total Bilirubin 0.3 - 1.2 mg/dL 0.3 0.3 0.3  Alkaline Phos 38 - 126 U/L 94 86 79  AST 15 - 41 U/L 27 24 16   ALT 0 - 44 U/L 35 24 13      RADIOGRAPHIC STUDIES: I have personally reviewed the radiological images as listed and agreed with the findings in the report. No results found.    ASSESSMENT & PLAN:  Ryan Cowan is a 75 y.o. male with    1. 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 Hewas first treated with Sandostatin injections since 03/2015. He had disease progression in 9/2019with bone mets in spine, pelvic bone and sternum. -Hehas completed 5 treatments of Lutatherawith Dr. Leonia Reeves on 08/21/18-02/05/19. -DOTATATE PET scan from 03/12/19 shows very good response to [place] treatment butunlikely cured from this alone .  -We restarted monthly Sandostatin injections on 05/22/19-07/19/19 but symptoms ofdiarrhea, fatigue anddid not improvewhich islikely related to his persistent high tumor burden. -I started him on third line therapyoralAfinitor7.5mg  daily beginning 09/17/19. Tolerating well. Will increase to 10mg  daily on next refill  -He is clinically stable with diarrhea. Oral exam normal today although he notes recent redness. Labs reviewed, CBC and CMP WNL except WBC 3, hg 10.6, plt 96K, Ca 8.3. Given stable mildly low blood counts he is fine to increase Afinitor to full dose 10mg . He is agreeable.  -Will proceed with restaging CT AP before next visit -F/u in 4 weeks  -I encouraged to have [place] vaccination. I gave him number to call to register.    2. Diarrhea, Fatigue, RLQ pain -These symptoms persisted even when off treatment. This is likely related to his carcinoid tumor.  -He has seen PT who has instructed him on exercises and encouraged him to walk more. -Has mildly worsened on Afinitor.  -He has been taking imodium. He was encouraged to increase up to 8 tablets of imodium to control his diarrhea. I encouraged him to watch for constipation.  -His fatigue and fluctuating Diarrhea is mostly stable. He will normally have BM 2-3 times a day. I encouraged him to increase physical activity at home. I also recommend he drink more water.   3. Bone mets, Mild Hypocalcemia  -first  noticed on PET scan in 07/2018 -To reduce his risk of fracture I started him on monthly Xgeva on 10/17/19, tolerated well. He has full dentures in place, I encouraged him to watch for bone exposure.  -He will continue oral calcium and Vit D. Ca 8.3 today (11/18/19), I advised him to increase oral calcium dose to 3 tabs daily.   4. 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 Hedeveloped anemiaagain since [place] treatment -He is not on oral iron supplement, but has been treated with IV Feraheme before with complete response. -Stable lately.  5. Possible right inguinal hernia  -Patient complains of having a "knot" in the groin area. -I have previously dicussed with the patient and we have agreed to just continue watching the area as long as no pain is present. -Stable with no pain  6. Anxiety/Depressionand  fatigue -Has been anxious lately. He had not wanted to stay home alone. His sisters would stay with him even overnight and required ED visit.  -On Zoloft100mg  currently, Managed by Dr. Arelia Sneddon -[place] continuespiritual counselingand speaking with SW as needed. -overall much improved. He is able to do more solo activities lately. Continue Zoloft 100mg .   7. Pancytopenia -Patient has developed mild pancytopenia since Lutathera treatment,probably related to the treatment and is diffuse bone metastasis -We also discussed the small possibility of MDS and leukemia after Lutathera therapy, will continue monitoring. -Stable since last visit.   8. Atypical chest pain  -He had 1 episode of 10-15 minutes diffuse chest pain  -He was only sitting at the time. Has not recurred. No recent SOB.  -Will monitor. If recurs he can contact his PCP.    Plan -Proceed with Xgeva injection today. Increase oral calcium -F/u and Xgeva in 4 weeks with lab and CT AP W Contrast a few days before.  -Increase Afinitor to 10mg  daily, I will refill today, will start  in about 2 weeks    No problem-specific Assessment & Plan notes found for this encounter.   Orders Placed This Encounter  Procedures  . CT Abdomen Pelvis W Contrast    Standing Status:   Future    Standing Expiration Date:   11/17/2020    Order Specific Question:   If indicated for the ordered procedure, I authorize the administration of contrast media per Radiology protocol    Answer:   Yes    Order Specific Question:   Preferred imaging location?    Answer:   [facility]    Order Specific Question:   Is Oral Contrast requested for this exam?    Answer:   Yes, Per Radiology protocol    Order Specific Question:   Radiology Contrast Protocol - do NOT remove file path    Answer:   [email or web address]   All questions were answered. The patient knows to call the clinic with any problems, questions or concerns. No barriers to learning was detected. The total time spent in the appointment was 30 minutes.     [name], MD 11/18/2019   I, Joslyn Devon, am acting as scribe for [name], MD.   I have reviewed the above documentation for accuracy and completeness, and I agree with the above.

## 2019-11-18 ENCOUNTER — Inpatient Hospital Stay: Payer: Medicare Other | Admitting: Hematology

## 2019-11-18 ENCOUNTER — Inpatient Hospital Stay: Payer: Medicare Other

## 2019-11-18 ENCOUNTER — Inpatient Hospital Stay: Payer: Medicare Other | Attending: Hematology

## 2019-11-18 ENCOUNTER — Telehealth: Payer: Self-pay | Admitting: Hematology

## 2019-11-18 ENCOUNTER — Other Ambulatory Visit: Payer: Self-pay

## 2019-11-18 ENCOUNTER — Encounter: Payer: Self-pay | Admitting: Hematology

## 2019-11-18 VITALS — BP 163/90 | HR 65 | Temp 97.9°F | Resp 17 | Ht 73.0 in | Wt 152.1 lb

## 2019-11-18 DIAGNOSIS — Z79899 Other long term (current) drug therapy: Secondary | ICD-10-CM | POA: Diagnosis not present

## 2019-11-18 DIAGNOSIS — C772 Secondary and unspecified malignant neoplasm of intra-abdominal lymph nodes: Secondary | ICD-10-CM | POA: Diagnosis not present

## 2019-11-18 DIAGNOSIS — R5383 Other fatigue: Secondary | ICD-10-CM | POA: Insufficient documentation

## 2019-11-18 DIAGNOSIS — D61818 Other pancytopenia: Secondary | ICD-10-CM | POA: Diagnosis not present

## 2019-11-18 DIAGNOSIS — R1031 Right lower quadrant pain: Secondary | ICD-10-CM | POA: Insufficient documentation

## 2019-11-18 DIAGNOSIS — D500 Iron deficiency anemia secondary to blood loss (chronic): Secondary | ICD-10-CM

## 2019-11-18 DIAGNOSIS — R197 Diarrhea, unspecified: Secondary | ICD-10-CM | POA: Diagnosis not present

## 2019-11-18 DIAGNOSIS — C787 Secondary malignant neoplasm of liver and intrahepatic bile duct: Secondary | ICD-10-CM | POA: Diagnosis not present

## 2019-11-18 DIAGNOSIS — D509 Iron deficiency anemia, unspecified: Secondary | ICD-10-CM | POA: Diagnosis not present

## 2019-11-18 DIAGNOSIS — F419 Anxiety disorder, unspecified: Secondary | ICD-10-CM | POA: Diagnosis not present

## 2019-11-18 DIAGNOSIS — C7951 Secondary malignant neoplasm of bone: Secondary | ICD-10-CM | POA: Insufficient documentation

## 2019-11-18 DIAGNOSIS — C7A029 Malignant carcinoid tumor of the large intestine, unspecified portion: Secondary | ICD-10-CM

## 2019-11-18 DIAGNOSIS — R05 Cough: Secondary | ICD-10-CM | POA: Diagnosis not present

## 2019-11-18 DIAGNOSIS — Z85038 Personal history of other malignant neoplasm of large intestine: Secondary | ICD-10-CM | POA: Diagnosis not present

## 2019-11-18 LAB — COMPREHENSIVE METABOLIC PANEL
ALT: 35 U/L (ref 0–44)
AST: 27 U/L (ref 15–41)
Albumin: 3.3 g/dL — ABNORMAL LOW (ref 3.5–5.0)
Alkaline Phosphatase: 94 U/L (ref 38–126)
Anion gap: 6 (ref 5–15)
BUN: 15 mg/dL (ref 8–23)
CO2: 26 mmol/L (ref 22–32)
Calcium: 8.3 mg/dL — ABNORMAL LOW (ref 8.9–10.3)
Chloride: 109 mmol/L (ref 98–111)
Creatinine, Ser: 0.75 mg/dL (ref 0.61–1.24)
GFR calc Af Amer: >60 mL/min (ref >60)
GFR calc non Af Amer: >60 mL/min (ref >60)
Glucose, Bld: 93 mg/dL (ref 70–99)
Potassium: 3.8 mmol/L (ref 3.5–5.1)
Sodium: 141 mmol/L (ref 135–145)
Total Bilirubin: 0.3 mg/dL (ref 0.3–1.2)
Total Protein: 6.1 g/dL — ABNORMAL LOW (ref 6.5–8.1)

## 2019-11-18 LAB — CBC WITH DIFFERENTIAL/PLATELET
Abs Immature Granulocytes: 0 10*3/uL (ref 0.00–0.07)
Basophils Absolute: 0 10*3/uL (ref 0.0–0.1)
Basophils Relative: 1 %
Eosinophils Absolute: 0.1 10*3/uL (ref 0.0–0.5)
Eosinophils Relative: 2 %
HCT: 30.8 % — ABNORMAL LOW (ref 39.0–52.0)
Hemoglobin: 10.6 g/dL — ABNORMAL LOW (ref 13.0–17.0)
Immature Granulocytes: 0 %
Lymphocytes Relative: 13 %
Lymphs Abs: 0.4 10*3/uL — ABNORMAL LOW (ref 0.7–4.0)
MCH: 34 pg (ref 26.0–34.0)
MCHC: 34.4 g/dL (ref 30.0–36.0)
MCV: 98.7 fL (ref 80.0–100.0)
Monocytes Absolute: 0.5 10*3/uL (ref 0.1–1.0)
Monocytes Relative: 17 %
Neutro Abs: 2.1 10*3/uL (ref 1.7–7.7)
Neutrophils Relative %: 67 %
Platelets: 93 10*3/uL — ABNORMAL LOW (ref 150–400)
RBC: 3.12 MIL/uL — ABNORMAL LOW (ref 4.22–5.81)
RDW: 11.7 % (ref 11.5–15.5)
WBC: 3 10*3/uL — ABNORMAL LOW (ref 4.0–10.5)
nRBC: 0 % (ref 0.0–0.2)

## 2019-11-18 MED ORDER — EVEROLIMUS 10 MG PO TABS: 10.0000 mg | ORAL_TABLET | Freq: Every day | ORAL | 0 refills | Status: DC

## 2019-11-18 MED ORDER — DENOSUMAB 120 MG/1.7ML ~~LOC~~ SOLN
120.0000 mg | Freq: Once | SUBCUTANEOUS | Status: AC
  Administered 2019-11-18: 120 mg via SUBCUTANEOUS

## 2019-11-18 MED ORDER — DENOSUMAB 120 MG/1.7ML ~~LOC~~ SOLN
SUBCUTANEOUS | Status: AC
  Filled 2019-11-18: qty 1.7

## 2019-11-18 NOTE — Progress Notes (Signed)
Ok to give xgeva today per Dr Burr Medico with calcium of 8.3

## 2019-11-18 NOTE — Patient Instructions (Signed)
Denosumab injection What is this medicine? DENOSUMAB (den oh sue mab) slows bone breakdown. Prolia is used to treat osteoporosis in women after menopause and in men, and in people who are taking corticosteroids for 6 months or more. Xgeva is used to treat a high calcium level due to cancer and to prevent bone fractures and other bone problems caused by multiple myeloma or cancer bone metastases. Xgeva is also used to treat giant cell tumor of the bone. This medicine may be used for other purposes; ask your health care provider or pharmacist if you have questions. COMMON BRAND NAME(S): Prolia, XGEVA What should I tell my health care provider before I take this medicine? They need to know if you have any of these conditions:  dental disease  having surgery or tooth extraction  infection  kidney disease  low levels of calcium or Vitamin D in the blood  malnutrition  on hemodialysis  skin conditions or sensitivity  thyroid or parathyroid disease  an unusual reaction to denosumab, other medicines, foods, dyes, or preservatives  pregnant or trying to get pregnant  breast-feeding How should I use this medicine? This medicine is for injection under the skin. It is given by a health care professional in a hospital or clinic setting. A special MedGuide will be given to you before each treatment. Be sure to read this information carefully each time. For Prolia, talk to your pediatrician regarding the use of this medicine in children. Special care may be needed. For Xgeva, talk to your pediatrician regarding the use of this medicine in children. While this drug may be prescribed for children as young as 13 years for selected conditions, precautions do apply. Overdosage: If you think you have taken too much of this medicine contact a poison control center or emergency room at once. NOTE: This medicine is only for you. Do not share this medicine with others. What if I miss a dose? It is  important not to miss your dose. Call your doctor or health care professional if you are unable to keep an appointment. What may interact with this medicine? Do not take this medicine with any of the following medications:  other medicines containing denosumab This medicine may also interact with the following medications:  medicines that lower your chance of fighting infection  steroid medicines like prednisone or cortisone This list may not describe all possible interactions. Give your health care provider a list of all the medicines, herbs, non-prescription drugs, or dietary supplements you use. Also tell them if you smoke, drink alcohol, or use illegal drugs. Some items may interact with your medicine. What should I watch for while using this medicine? Visit your doctor or health care professional for regular checks on your progress. Your doctor or health care professional may order blood tests and other tests to see how you are doing. Call your doctor or health care professional for advice if you get a fever, chills or sore throat, or other symptoms of a cold or flu. Do not treat yourself. This drug may decrease your body's ability to fight infection. Try to avoid being around people who are sick. You should make sure you get enough calcium and vitamin D while you are taking this medicine, unless your doctor tells you not to. Discuss the foods you eat and the vitamins you take with your health care professional. See your dentist regularly. Brush and floss your teeth as directed. Before you have any dental work done, tell your dentist you are   receiving this medicine. Do not become pregnant while taking this medicine or for 5 months after stopping it. Talk with your doctor or health care professional about your birth control options while taking this medicine. Women should inform their doctor if they wish to become pregnant or think they might be pregnant. There is a potential for serious side  effects to an unborn child. Talk to your health care professional or pharmacist for more information. What side effects may I notice from receiving this medicine? Side effects that you should report to your doctor or health care professional as soon as possible:  allergic reactions like skin rash, itching or hives, swelling of the face, lips, or tongue  bone pain  breathing problems  dizziness  jaw pain, especially after dental work  redness, blistering, peeling of the skin  signs and symptoms of infection like fever or chills; cough; sore throat; pain or trouble passing urine  signs of low calcium like fast heartbeat, muscle cramps or muscle pain; pain, tingling, numbness in the hands or feet; seizures  unusual bleeding or bruising  unusually weak or tired Side effects that usually do not require medical attention (report to your doctor or health care professional if they continue or are bothersome):  constipation  diarrhea  headache  joint pain  loss of appetite  muscle pain  runny nose  tiredness  upset stomach This list may not describe all possible side effects. Call your doctor for medical advice about side effects. You may report side effects to FDA at 1-800-FDA-1088. Where should I keep my medicine? This medicine is only given in a clinic, doctor's office, or other health care setting and will not be stored at home. NOTE: This sheet is a summary. It may not cover all possible information. If you have questions about this medicine, talk to your doctor, pharmacist, or health care provider.  2020 Elsevier/Gold Standard (2018-01-26 16:10:44)

## 2019-11-18 NOTE — Telephone Encounter (Signed)
Scheduled apt per 2/15 los - gave patient AVS and calender per los.

## 2019-11-19 ENCOUNTER — Telehealth: Payer: Self-pay

## 2019-11-19 ENCOUNTER — Other Ambulatory Visit: Payer: Self-pay | Admitting: Pharmacist

## 2019-11-19 DIAGNOSIS — C7A029 Malignant carcinoid tumor of the large intestine, unspecified portion: Secondary | ICD-10-CM

## 2019-11-19 MED ORDER — EVEROLIMUS 10 MG PO TABS: 10.0000 mg | ORAL_TABLET | Freq: Every day | ORAL | 0 refills | Status: DC

## 2019-11-19 NOTE — Telephone Encounter (Signed)
Oral Oncology Patient Advocate Encounter  Patient is approved for [employer] [facility] [place] ([facility]) to reduce the patient's out of pocket expense for Afinitor to $0.    Approval Dates 11/18/19-10/02/20   [employer] phone number for follow up is [phone].   [facility] [place] Phone [phone] Fax [phone] 11/19/2019 10:09 AM

## 2019-12-06 NOTE — Progress Notes (Signed)
[place]   [phone] [phone]   Clinic Follow up Note   Patient Care Team: Leonard Downing, MD as PCP - General (Family Medicine)  Date of Service:  12/13/2019  CHIEF COMPLAINT: F/u of Metastatic Carcinoid cancer  SUMMARY OF ONCOLOGIC HISTORY: Oncology History Overview Note  Carcinoid tumor of colon, malignant   Staging form: Colon and Rectum, AJCC 7th Edition     Clinical: Stage Unknown ([place], N2b, M1) - Unsigned     Carcinoid tumor of colon, malignant ([place])  02/16/2015 Imaging   CT abdomen and pelvis with contrast showed wall thickening of the cecum and terminal ileum high suspicious for cecal neoplasm. Extensive confluent ileal colic adenopathy extending chronically along the superior mesenteric vein, multiple hepatic metastasis   02/18/2015 Initial Diagnosis   Carcinoid tumor of colon, malignant, with liver metastasis   02/18/2015 Initial Biopsy   Liver biopsy showed metastatic low-grade neuroendocrine tumor (carcinoid). The tumor is positive for CD X2, CD 56, chromogranin, and synaptophysin.   03/05/2015 - 07/24/2018 Chemotherapy   Sandostatin 30 mg IM every 4 weeks. Due to disease progression regular monthly injections stopped after 07/24/18   03/06/2015 Imaging   CT chest showed no evidence of metastasis in the chest. 4.4 cm ascending aortic aneurysm.   09/09/2015 Imaging   Restaging CT abdomen and pelvis with contrast showed interval response of hepatic metastasis. Most are small and, some are stable. Slight interval decrease in mesenteric adenopathy and primary colon tumor.    12/06/2016 Imaging   CT CAP w contrast IMPRESSION: 1. Overall, the extent of metastatic disease to the liver and abdominal/retroperitoneal lymphatics is similar to the prior examination 08/19/2016. 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 2. Multiple small pulmonary  nodules appear unchanged in size, number and distribution, favored to be benign. 3. Aortic atherosclerosis, in addition to 2 vessel coronary artery disease. Assessment for potential risk factor modification, dietary therapy or pharmacologic therapy may be warranted, if clinically indicated. 4. In addition, there is dilatation of the aortic root (4.7 cm in diameter at the level of the sinuses of Valsalva) and ectasia of the ascending thoracic aorta measuring 4.4 cm in diameter. Recommend annual imaging followup by CTA or MRA. This recommendation follows 2010 ACCF/AHA/AATS/ACR/ASA/SCA/SCAI/SIR/STS/SVM Guidelines for the Diagnosis and Management of Patients with Thoracic Aortic Disease. Circulation. 2010; 121: [id number]. 5. Trace volume of ascites. 6. Additional incidental findings, as above.    05/31/2017 Imaging   CT CAP W Contrast 05/31/17 IMPRESSION: 1. Overall the extent of metastatic disease to the liver and abdominal adenopathy is similar to previous exam from 12/06/2016. 2. 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 3. Stable small pulmonary nodules. 4.  Aortic Atherosclerosis (ICD10-I70.0). 5. Dilatation of the ascending thoracic aorta. Unchanged. Recommend annual imaging followup by CTA or MRA. This recommendation follows 2010 ACCF/AHA/AATS/ACR/ASA/SCA/SCAI/SIR/STS/SVM Guidelines for the Diagnosis and Management of Patients with Thoracic Aortic Disease. Circulation. 2010; 121: [id number] 6. Trace ascites.   12/07/2017 Imaging   CT CAP W Contrast 12/07/17 IMPRESSION: Chest Impression: 1. No evidence of thoracic metastasis. 2. Stable ascending thoracic aortic aneurysm at 4.4 cm. Recommend attention on oncology surveillance. Abdomen / Pelvis Impression: 1. Mild interval increase in size of multifocal hepatic metastasis. 2. Large central small bowel mesenteric mass is unchanged 3. No bowel obstruction. 4. No  skeletal lesions    06/12/2018 Imaging   06/12/2018  CT AP IMPRESSION: Large mesenteric mass in the right mid abdomen, increased.  Multifocal hepatic metastases, increased.  Mild upper abdominal lymphadenopathy, mildly increased.  Masslike thickening involving the terminal ileum, suspicious for metastasis, similar to the prior. Additional nonspecific wall thickening involving multiple loops of small bowel in the pelvis.  Small volume pelvic ascites, mildly increased.   06/12/2018 Progression   IMPRESSION: Large mesenteric mass in the right mid abdomen, increased.  Multifocal hepatic metastases, increased.  Mild upper abdominal lymphadenopathy, mildly increased.  Masslike thickening involving the terminal ileum, suspicious for metastasis, similar to the prior. Additional nonspecific wall thickening involving multiple loops of small bowel in the pelvis.  Small volume pelvic ascites, mildly increased.   07/18/2018 PET scan   IMPRESSION: 1. Examination is positive for extensive neuroendocrine tumor receptor avid metastatic disease involving the liver, axial and appendicular skeleton, thoracic and abdominal lymph nodes. There is a dominant mass within the mesentery which is intensely radiotracer avid with a maximum dimension of 11.9 cm and an SUV max of 44.32. Within the distal small bowel loops there is a radiotracer avid lesion likely within the distal ileum. 2. When compared with CT from 06/13/2018 the tumor volume within the abdomen is not significantly changed in the interval. 3.  Aortic Atherosclerosis (ICD10-I70.0). 4. Coronary artery atherosclerotic calcifications 5. Nonobstructing left renal calculi   08/21/2018 - 02/05/2019 Chemotherapy   Lutathera treatment with Sandostatin injection 08/21/18, 10/16/18, 12/11/18, 02/05/19   03/12/2019 PET scan   IMPRESSION: 1. Marked interval positive response to peptide receptor radiotherapy. 2. Significant decrease in size and  radiotracer activity within liver metastasis. 3. Significant decrease in size and radiotracer activity of large peritoneal mass in RIGHT lower quadrant. 4. Decrease in size and radiotracer activity of retroperitoneal adenopathy in the upper abdomen. 5. Decrease in size and radiotracer activity or resolution of activity of the small skeletal metastasis.     05/22/2019 - 07/19/2019 Chemotherapy   Monthly Sandostatin injections restarted on 05/22/19-07/19/19. Did not improve symptoms of diarrhea, fatigue and did not improve which is likely related to his persistent high tumor burden.    06/17/2019 Imaging   CT AP W Contast  IMPRESSION: Multifocal hepatic metastases, unchanged from recent PET.   Mesenteric nodal metastasis in the right mid abdomen, unchanged from recent PET.   Stable wall thickening involving the distal ileum.   No focal osseous metastases are evident on CT.   Additional ancillary findings as above.     09/17/2019 -  Chemotherapy   Third line therapy oral Afinitor 7.5mg  daily starting 09/17/19. Increased to full dose 10mg  starting 12/11/19   12/12/2019 Imaging   CT AP W Contrast IMPRESSION: 1. Liver metastases are all mildly decreased. 2. Conglomerate nodal mass in the right mesentery is mildly decreased. 3. No new or progressive metastatic disease in the abdomen or pelvis. 4. Chronic wall thickening in the distal and terminal ileum and cecum surrounding the ileocecal valve, unchanged. No evidence of bowel obstruction. 5. Aortic Atherosclerosis (ICD10-I70.0).      CURRENT THERAPY:  Third line therapyoralAfinitor7.5mg  dailystarting 09/17/19. Increased to full dose 10mg  starting 12/11/19 Xgeva monthly starting 10/17/19. Due to hypocalcemia will reduce to q16months starting 12/13/19.    INTERVAL HISTORY:  Lindsey P Casella is here for a follow up of treatment. He presents to the clinic with his sister. He notes he notes he is active more after the morning. He  is able to walk around [place] easily, but after he would get home he would be tired.  He notes his balanced is mildly unstable but denies fall. Overall his energy has improved since starting Afinitor. He has been able to gain weight. He notes he has adequate appetite. He notes he continues to tolerate Altha well.     REVIEW OF SYSTEMS:   Constitutional: Denies fevers, chills or abnormal weight loss (+) Improved overall energy Eyes: Denies blurriness of vision Ears, nose, mouth, throat, and face: Denies mucositis or sore throat Respiratory: Denies cough, dyspnea or wheezes Cardiovascular: Denies palpitation, chest discomfort or lower extremity swelling Gastrointestinal:  Denies nausea, heartburn or change in bowel habits Skin: Denies abnormal skin rashes Lymphatics: Denies new lymphadenopathy or easy bruising Neurological:Denies numbness, tingling or new weaknesses (+) Mildly unstable balance Behavioral/Psych: Mood is stable, no new changes   All other systems were reviewed with the patient and are negative.  MEDICAL HISTORY:  Past Medical History:  Diagnosis Date  . Colon cancer ([place]) dx'd 02/2015  . Liver metastases ([place]) dx'd 02/2015  . Metastatic cancer to bone [facility]) dx'd 2019    SURGICAL HISTORY: Past Surgical History:  Procedure Laterality Date  . APPENDECTOMY    . KNEE SURGERY    . SHOULDER SURGERY      I have reviewed the social history and family history with the patient and they are unchanged from previous note.  ALLERGIES:  is allergic to codeine.  MEDICATIONS:  Current Outpatient Medications  Medication Sig Dispense Refill  . calcium-vitamin D (OSCAL WITH D) 500-200 MG-UNIT tablet Take 2 tablets by mouth daily with breakfast.    . cyanocobalamin 2000 MCG tablet Take 1,000 mcg by mouth daily.    Marland Kitchen everolimus (AFINITOR) 10 MG tablet Take 1 tablet (10 mg total) by mouth daily. 30 tablet 2  . Multiple Vitamin (MULTIVITAMIN WITH MINERALS) TABS tablet Take 1 tablet by  mouth daily. Centrum Silver 50 plus    . naproxen sodium (ANAPROX) 220 MG tablet Take 220 mg by mouth 2 (two) times daily as needed (pain).    Marland Kitchen OVER THE COUNTER MEDICATION Place 1 application into both nostrils daily as needed (congestion). Vicks inhaler stick    . sertraline (ZOLOFT) 100 MG tablet Take 50 mg by mouth daily. Taking 1/2 tablet     No current facility-administered medications for this visit.    PHYSICAL EXAMINATION: ECOG PERFORMANCE STATUS: 2 - Symptomatic, <50% confined to bed  Vitals:   12/13/19 1237  BP: (!) 143/77  Pulse: (!) 55  Resp: 20  Temp: 99.1 F (37.3 C)  SpO2: 95%   Filed Weights   12/13/19 1237  Weight: 151 lb 11.2 oz (68.8 kg)    GENERAL:alert, no distress and comfortable SKIN: skin color, texture, turgor are normal, no rashes or significant lesions EYES: normal, Conjunctiva are pink and non-injected, sclera clear  NECK: supple, thyroid normal size, non-tender, without nodularity LYMPH:  no palpable lymphadenopathy in the cervical, axillary  LUNGS: clear to auscultation and percussion with normal breathing effort HEART: regular rate & rhythm and no murmurs and no lower extremity edema ABDOMEN:abdomen soft, non-tender and normal bowel sounds Musculoskeletal:no cyanosis of digits and no clubbing  NEURO: alert & oriented x 3 with fluent speech, no focal motor/sensory deficits  LABORATORY DATA:  I have reviewed the data as listed CBC Latest Ref Rng & Units 12/12/2019 11/18/2019 10/17/2019  WBC 4.0 - 10.5 K/uL 2.6(L) 3.0(L) 2.3(L)  Hemoglobin 13.0 - 17.0 g/dL 10.6(L) 10.6(L) 11.1(L)  Hematocrit 39.0 - 52.0 % 31.2(L) 30.8(L) 32.2(L)  Platelets 150 - 400 K/uL 90(L)  93(L) 103(L)     CMP Latest Ref Rng & Units 12/12/2019 11/18/2019 10/17/2019  Glucose 70 - 99 mg/dL 89 93 87  BUN 8 - 23 mg/dL 16 15 16   Creatinine 0.61 - 1.24 mg/dL 0.79 0.75 0.77  Sodium 135 - 145 mmol/L 142 141 141  Potassium 3.5 - 5.1 mmol/L 3.7 3.8 4.0  Chloride 98 - 111 mmol/L 104  109 105  CO2 22 - 32 mmol/L 28 26 27   Calcium 8.9 - 10.3 mg/dL 8.5(L) 8.3(L) 8.7(L)  Total Protein 6.5 - 8.1 g/dL 5.8(L) 6.1(L) 6.1(L)  Total Bilirubin 0.3 - 1.2 mg/dL 0.3 0.3 0.3  Alkaline Phos 38 - 126 U/L 83 94 86  AST 15 - 41 U/L 23 27 24   ALT 0 - 44 U/L 28 35 24      RADIOGRAPHIC STUDIES: I have personally reviewed the radiological images as listed and agreed with the findings in the report. 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 Restaging. EXAM: CT ABDOMEN AND PELVIS WITH CONTRAST TECHNIQUE: Multidetector CT imaging of the abdomen and pelvis was performed using the standard protocol following bolus administration of intravenous contrast. CONTRAST:  173mL OMNIPAQUE IOHEXOL 300 MG/ML  SOLN COMPARISON:  06/17/2019 CT abdomen/pelvis. 03/12/2027 DOTATATE PET-CT. FINDINGS: Lower chest: No significant pulmonary nodules or acute consolidative airspace disease. Hepatobiliary: There are at least 8 similar hypodense liver masses scattered throughout the liver, all mildly decreased in size since 06/17/2019 CT abdomen study. For example segment 7 right liver 2.6 cm mass (series 2/image 8), previously 2.8 cm. Segment 2 left liver lobe 2.3 cm mass (series 2/image 13), previously 2.5 cm. Segment 4A left liver lobe 1.6 cm mass (series 2/image 7), previously 1.8 cm. No new or enlarging liver masses. Normal gallbladder with no radiopaque cholelithiasis. No biliary ductal dilatation. Pancreas: Normal, with no mass or duct dilation. Spleen: Normal size. No mass. Adrenals/Urinary Tract: No discrete adrenal nodules. Simple 3.7 cm lower left renal cyst. Additional subcentimeter hypodense left renal cortical lesions are too small to characterize and are unchanged, considered benign. No new renal lesions. No  hydronephrosis. Normal bladder. Stomach/Bowel: Normal non-distended stomach. Oral contrast transits to the rectum. No significantly dilated small bowel loops. Chronic wall thickening in the distal and terminal ileum and cecum surrounding the ileocecal valve is unchanged. No new sites of small bowel wall thickening. Appendix not discretely visualized. No new sites of large bowel wall thickening. No colonic diverticulosis. No acute pericolonic fat stranding. Vascular/Lymphatic: Atherosclerotic nonaneurysmal abdominal aorta. Patent hepatic, portal, splenic and renal veins. Conglomerate nodal mass in the right mesentery measures 4.5 x 2.6 cm (series 2/image 43), previously 4.9 x 2.7 cm, mildly decreased. No new pathologically enlarged lymph nodes in the abdomen or pelvis. Reproductive: Mildly enlarged prostate. Other: Trace free fluid in the deep pelvis, unchanged. No focal fluid collections. No pneumoperitoneum. Musculoskeletal: No discrete osseous lesions by CT. Mild lumbar spondylosis. IMPRESSION: 1. Liver metastases are all mildly decreased. 2. Conglomerate nodal mass in the right mesentery is mildly decreased. 3. No new or progressive metastatic disease in the abdomen or pelvis. 4. Chronic wall thickening in the distal and terminal ileum and cecum surrounding the ileocecal valve, unchanged. No evidence of bowel obstruction. 5. Aortic Atherosclerosis (ICD10-I70.0). Electronically Signed   By: Ilona Sorrel M.D.   On: 12/12/2019 15:05     ASSESSMENT &  PLAN:  Ryan Cowan is a 74 y.o. male with    1. 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 Hewas first treated with Sandostatin injections since 03/2015. He had disease progression in 9/2019with bone mets in spine, pelvic bone and sternum. -Hehas completed 5 treatments of Lutatherawith Dr. Leonia Reeves on 08/21/18-02/05/19. -DOTATATE PET scan from 03/12/19 shows very good response to [place]  treatment butunlikely cured from this alone .  -We restarted monthly Sandostatin injections on 05/22/19 through 07/19/19 but symptoms ofdiarrhea, fatigue anddid not improvewhich islikely related to his persistent high tumor burden. -Istarted him onthird line therapyoralAfinitor7.5mg  dailybeginning 09/17/19.Tolerating well. Increased to full dose 10mg  starting 12/11/19  -We reviewed and discussed his CT AP from 12/12/19 in person which shows liver mets and Conglomerate nodal mass in the right mesentery are mildly decreased. No new or progressive metastasis. Overall he is responding to treatment, will continue.  -Labs reviewed, WBC 2.6, Hg 10.6, plt 90K, Ca 8.5, protein 5.8, albumin 3.3. Overall stable and adequate to continue Afinitor 10mg  daily.  -f/u in 2 months    2. Diarrhea, Fatigue, RLQ pain -These symptoms persisted even when off treatment. This is likely related to his carcinoid tumor.  -He has seen PT who has instructed him on exercises and encouraged him to walk more. -He has been taking imodium. He was encouraged to increase up to 8 tablets of imodium to control his diarrhea. I encouraged him to watch for constipation. -His fatigue and fluctuating Diarrhea is mostly stable. He will normally have BM 2-3 times a day. Overall energy has improved on Afinitor. I encouraged him to remain active but avoid falls.   3. Bone mets, Mild Hypocalcemia  -first noticed on PET scan in 07/2018 -To reduce his risk of fracture I started him on monthly Xgeva on 10/17/19, tolerated well. He has full dentures in place, I encouraged him to watch for bone exposure.  -He will continue oral calcium and Vit D. Due to hypocalcemia, I advised him to increase oral calcium dose to 3 tabs daily and I will reduce Xgeva to every 2 months starting 12/13/19.   4. 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 -Stable lately.  5. Possible right inguinal hernia  -Patient complains of having a "knot" in the groin area. -I have previously dicussed with the patient and we have agreed to just continue watching the area as long as no pain is present. -Stable with no pain  6. Anxiety/Depressionand fatigue -Has been anxious lately. He had not wanted to stay home alone. His sisters would stay with him even overnight and required ED visit.  -On Zoloft100mg  currently, Managed by Dr. Arelia Sneddon -[place] continuespiritual counselingand speaking with SW as needed. -overall much improved. He is able to do more solo activities lately. Continue Zoloft 100mg .   7. Pancytopenia -Patient has developed mild pancytopenia since Lutathera treatment,probably related to the treatment and is diffuse bone metastasis -We also discussed the small possibility of MDS and leukemia after Lutathera therapy, will continue monitoring. -Stable.     Plan -Proceed with Xgeva injection today. Will reduce to every other month due to hypocalcemia.  -Lab, F/u and Xgeva in 2 months  -Continue Afinitor 10mg  daily, I will refill today   No problem-specific Assessment & Plan notes found for  this encounter.   No orders of the defined types were placed in this encounter.  All questions were answered. The patient knows to call the clinic with any problems, questions or concerns. No barriers to learning was detected. The total time spent in the appointment was 30 minutes.     [name], MD 12/13/2019   I, Joslyn Devon, am acting as scribe for [name], MD.   I have reviewed the above documentation for accuracy and completeness, and I agree with the above.

## 2019-12-12 ENCOUNTER — Ambulatory Visit (HOSPITAL_COMMUNITY)
Admission: RE | Admit: 2019-12-12 | Discharge: 2019-12-12 | Disposition: A | Discharge status: Home / Self Care | Payer: Medicare Other | Source: Ambulatory Visit | Attending: Hematology | Admitting: Hematology

## 2019-12-12 ENCOUNTER — Inpatient Hospital Stay: Payer: Medicare Other | Attending: Hematology

## 2019-12-12 ENCOUNTER — Other Ambulatory Visit: Payer: Self-pay

## 2019-12-12 DIAGNOSIS — D509 Iron deficiency anemia, unspecified: Secondary | ICD-10-CM | POA: Insufficient documentation

## 2019-12-12 DIAGNOSIS — Z79899 Other long term (current) drug therapy: Secondary | ICD-10-CM | POA: Diagnosis not present

## 2019-12-12 DIAGNOSIS — C7A029 Malignant carcinoid tumor of the large intestine, unspecified portion: Secondary | ICD-10-CM | POA: Diagnosis not present

## 2019-12-12 DIAGNOSIS — D61818 Other pancytopenia: Secondary | ICD-10-CM | POA: Insufficient documentation

## 2019-12-12 DIAGNOSIS — R197 Diarrhea, unspecified: Secondary | ICD-10-CM | POA: Diagnosis not present

## 2019-12-12 DIAGNOSIS — R5383 Other fatigue: Secondary | ICD-10-CM | POA: Insufficient documentation

## 2019-12-12 DIAGNOSIS — F419 Anxiety disorder, unspecified: Secondary | ICD-10-CM | POA: Insufficient documentation

## 2019-12-12 DIAGNOSIS — R1031 Right lower quadrant pain: Secondary | ICD-10-CM | POA: Diagnosis not present

## 2019-12-12 DIAGNOSIS — C7951 Secondary malignant neoplasm of bone: Secondary | ICD-10-CM | POA: Diagnosis not present

## 2019-12-12 DIAGNOSIS — C787 Secondary malignant neoplasm of liver and intrahepatic bile duct: Secondary | ICD-10-CM | POA: Diagnosis not present

## 2019-12-12 DIAGNOSIS — C772 Secondary and unspecified malignant neoplasm of intra-abdominal lymph nodes: Secondary | ICD-10-CM | POA: Insufficient documentation

## 2019-12-12 LAB — CBC WITH DIFFERENTIAL/PLATELET
Abs Immature Granulocytes: 0 10*3/uL (ref 0.00–0.07)
Basophils Absolute: 0 10*3/uL (ref 0.0–0.1)
Basophils Relative: 0 %
Eosinophils Absolute: 0 10*3/uL (ref 0.0–0.5)
Eosinophils Relative: 2 %
HCT: 31.2 % — ABNORMAL LOW (ref 39.0–52.0)
Hemoglobin: 10.6 g/dL — ABNORMAL LOW (ref 13.0–17.0)
Immature Granulocytes: 0 %
Lymphocytes Relative: 12 %
Lymphs Abs: 0.3 10*3/uL — ABNORMAL LOW (ref 0.7–4.0)
MCH: 33.5 pg (ref 26.0–34.0)
MCHC: 34 g/dL (ref 30.0–36.0)
MCV: 98.7 fL (ref 80.0–100.0)
Monocytes Absolute: 0.4 10*3/uL (ref 0.1–1.0)
Monocytes Relative: 14 %
Neutro Abs: 1.9 10*3/uL (ref 1.7–7.7)
Neutrophils Relative %: 72 %
Platelets: 90 10*3/uL — ABNORMAL LOW (ref 150–400)
RBC: 3.16 MIL/uL — ABNORMAL LOW (ref 4.22–5.81)
RDW: 11.9 % (ref 11.5–15.5)
WBC: 2.6 10*3/uL — ABNORMAL LOW (ref 4.0–10.5)
nRBC: 0 % (ref 0.0–0.2)

## 2019-12-12 LAB — COMPREHENSIVE METABOLIC PANEL
ALT: 28 U/L (ref 0–44)
AST: 23 U/L (ref 15–41)
Albumin: 3.3 g/dL — ABNORMAL LOW (ref 3.5–5.0)
Alkaline Phosphatase: 83 U/L (ref 38–126)
Anion gap: 10 (ref 5–15)
BUN: 16 mg/dL (ref 8–23)
CO2: 28 mmol/L (ref 22–32)
Calcium: 8.5 mg/dL — ABNORMAL LOW (ref 8.9–10.3)
Chloride: 104 mmol/L (ref 98–111)
Creatinine, Ser: 0.79 mg/dL (ref 0.61–1.24)
GFR calc Af Amer: >60 mL/min (ref >60)
GFR calc non Af Amer: >60 mL/min (ref >60)
Glucose, Bld: 89 mg/dL (ref 70–99)
Potassium: 3.7 mmol/L (ref 3.5–5.1)
Sodium: 142 mmol/L (ref 135–145)
Total Bilirubin: 0.3 mg/dL (ref 0.3–1.2)
Total Protein: 5.8 g/dL — ABNORMAL LOW (ref 6.5–8.1)

## 2019-12-12 MED ORDER — SODIUM CHLORIDE (PF) 0.9 % IJ SOLN
INTRAMUSCULAR | Status: AC
  Filled 2019-12-12: qty 50

## 2019-12-12 MED ORDER — IOHEXOL 300 MG/ML  SOLN
100.0000 mL | Freq: Once | INTRAMUSCULAR | Status: AC | PRN
  Administered 2019-12-12: 100 mL via INTRAVENOUS

## 2019-12-13 ENCOUNTER — Other Ambulatory Visit: Payer: Self-pay

## 2019-12-13 ENCOUNTER — Encounter: Payer: Self-pay | Admitting: Hematology

## 2019-12-13 ENCOUNTER — Inpatient Hospital Stay: Payer: Medicare Other

## 2019-12-13 ENCOUNTER — Inpatient Hospital Stay: Payer: Medicare Other | Admitting: Hematology

## 2019-12-13 ENCOUNTER — Telehealth: Payer: Self-pay | Admitting: Hematology

## 2019-12-13 VITALS — BP 143/77 | HR 55 | Temp 99.1°F | Resp 20 | Ht 73.0 in | Wt 151.7 lb

## 2019-12-13 DIAGNOSIS — D500 Iron deficiency anemia secondary to blood loss (chronic): Secondary | ICD-10-CM

## 2019-12-13 DIAGNOSIS — C7A029 Malignant carcinoid tumor of the large intestine, unspecified portion: Secondary | ICD-10-CM

## 2019-12-13 MED ORDER — DENOSUMAB 120 MG/1.7ML ~~LOC~~ SOLN
SUBCUTANEOUS | Status: AC
  Filled 2019-12-13: qty 1.7

## 2019-12-13 MED ORDER — DENOSUMAB 120 MG/1.7ML ~~LOC~~ SOLN
120.0000 mg | Freq: Once | SUBCUTANEOUS | Status: AC
  Administered 2019-12-13: 120 mg via SUBCUTANEOUS

## 2019-12-13 MED ORDER — EVEROLIMUS 10 MG PO TABS: 10.0000 mg | ORAL_TABLET | Freq: Every day | ORAL | 2 refills | Status: DC

## 2019-12-13 NOTE — Telephone Encounter (Signed)
Scheduled per 03/12 los, patient received after visit summary and calender.

## 2019-12-13 NOTE — Patient Instructions (Signed)
Denosumab injection What is this medicine? DENOSUMAB (den oh sue mab) slows bone breakdown. Prolia is used to treat osteoporosis in women after menopause and in men, and in people who are taking corticosteroids for 6 months or more. Xgeva is used to treat a high calcium level due to cancer and to prevent bone fractures and other bone problems caused by multiple myeloma or cancer bone metastases. Xgeva is also used to treat giant cell tumor of the bone. This medicine may be used for other purposes; ask your health care provider or pharmacist if you have questions. COMMON BRAND NAME(S): Prolia, XGEVA What should I tell my health care provider before I take this medicine? They need to know if you have any of these conditions:  dental disease  having surgery or tooth extraction  infection  kidney disease  low levels of calcium or Vitamin D in the blood  malnutrition  on hemodialysis  skin conditions or sensitivity  thyroid or parathyroid disease  an unusual reaction to denosumab, other medicines, foods, dyes, or preservatives  pregnant or trying to get pregnant  breast-feeding How should I use this medicine? This medicine is for injection under the skin. It is given by a health care professional in a hospital or clinic setting. A special MedGuide will be given to you before each treatment. Be sure to read this information carefully each time. For Prolia, talk to your pediatrician regarding the use of this medicine in children. Special care may be needed. For Xgeva, talk to your pediatrician regarding the use of this medicine in children. While this drug may be prescribed for children as young as 13 years for selected conditions, precautions do apply. Overdosage: If you think you have taken too much of this medicine contact a poison control center or emergency room at once. NOTE: This medicine is only for you. Do not share this medicine with others. What if I miss a dose? It is  important not to miss your dose. Call your doctor or health care professional if you are unable to keep an appointment. What may interact with this medicine? Do not take this medicine with any of the following medications:  other medicines containing denosumab This medicine may also interact with the following medications:  medicines that lower your chance of fighting infection  steroid medicines like prednisone or cortisone This list may not describe all possible interactions. Give your health care provider a list of all the medicines, herbs, non-prescription drugs, or dietary supplements you use. Also tell them if you smoke, drink alcohol, or use illegal drugs. Some items may interact with your medicine. What should I watch for while using this medicine? Visit your doctor or health care professional for regular checks on your progress. Your doctor or health care professional may order blood tests and other tests to see how you are doing. Call your doctor or health care professional for advice if you get a fever, chills or sore throat, or other symptoms of a cold or flu. Do not treat yourself. This drug may decrease your body's ability to fight infection. Try to avoid being around people who are sick. You should make sure you get enough calcium and vitamin D while you are taking this medicine, unless your doctor tells you not to. Discuss the foods you eat and the vitamins you take with your health care professional. See your dentist regularly. Brush and floss your teeth as directed. Before you have any dental work done, tell your dentist you are   receiving this medicine. Do not become pregnant while taking this medicine or for 5 months after stopping it. Talk with your doctor or health care professional about your birth control options while taking this medicine. Women should inform their doctor if they wish to become pregnant or think they might be pregnant. There is a potential for serious side  effects to an unborn child. Talk to your health care professional or pharmacist for more information. What side effects may I notice from receiving this medicine? Side effects that you should report to your doctor or health care professional as soon as possible:  allergic reactions like skin rash, itching or hives, swelling of the face, lips, or tongue  bone pain  breathing problems  dizziness  jaw pain, especially after dental work  redness, blistering, peeling of the skin  signs and symptoms of infection like fever or chills; cough; sore throat; pain or trouble passing urine  signs of low calcium like fast heartbeat, muscle cramps or muscle pain; pain, tingling, numbness in the hands or feet; seizures  unusual bleeding or bruising  unusually weak or tired Side effects that usually do not require medical attention (report to your doctor or health care professional if they continue or are bothersome):  constipation  diarrhea  headache  joint pain  loss of appetite  muscle pain  runny nose  tiredness  upset stomach This list may not describe all possible side effects. Call your doctor for medical advice about side effects. You may report side effects to FDA at 1-800-FDA-1088. Where should I keep my medicine? This medicine is only given in a clinic, doctor's office, or other health care setting and will not be stored at home. NOTE: This sheet is a summary. It may not cover all possible information. If you have questions about this medicine, talk to your doctor, pharmacist, or health care provider.  2020 Elsevier/Gold Standard (2018-01-26 16:10:44)

## 2020-02-11 DIAGNOSIS — F419 Anxiety disorder, unspecified: Secondary | ICD-10-CM | POA: Diagnosis not present

## 2020-02-11 DIAGNOSIS — R197 Diarrhea, unspecified: Secondary | ICD-10-CM | POA: Insufficient documentation

## 2020-02-11 DIAGNOSIS — D509 Iron deficiency anemia, unspecified: Secondary | ICD-10-CM | POA: Diagnosis not present

## 2020-02-11 DIAGNOSIS — C772 Secondary and unspecified malignant neoplasm of intra-abdominal lymph nodes: Secondary | ICD-10-CM | POA: Diagnosis not present

## 2020-02-11 DIAGNOSIS — C7951 Secondary malignant neoplasm of bone: Secondary | ICD-10-CM | POA: Insufficient documentation

## 2020-02-11 DIAGNOSIS — R5383 Other fatigue: Secondary | ICD-10-CM | POA: Diagnosis not present

## 2020-02-11 DIAGNOSIS — C787 Secondary malignant neoplasm of liver and intrahepatic bile duct: Secondary | ICD-10-CM | POA: Insufficient documentation

## 2020-02-11 DIAGNOSIS — Z79899 Other long term (current) drug therapy: Secondary | ICD-10-CM | POA: Diagnosis not present

## 2020-02-11 DIAGNOSIS — R1031 Right lower quadrant pain: Secondary | ICD-10-CM | POA: Insufficient documentation

## 2020-02-11 DIAGNOSIS — Z5111 Encounter for antineoplastic chemotherapy: Secondary | ICD-10-CM | POA: Insufficient documentation

## 2020-02-11 DIAGNOSIS — C7A029 Malignant carcinoid tumor of the large intestine, unspecified portion: Secondary | ICD-10-CM | POA: Diagnosis present

## 2020-02-13 NOTE — Progress Notes (Signed)
[place]   Telephone:([phone]) [phone] Fax:(336) [phone]   Clinic Follow up Note   Patient Care Team: Leonard Downing, MD as PCP - General (Family Medicine)  Date of Service:  02/14/2020  CHIEF COMPLAINT: F/u of Metastatic Carcinoid cancer  SUMMARY OF ONCOLOGIC HISTORY: Oncology History Overview Note  Carcinoid tumor of colon, malignant   Staging form: Colon and Rectum, AJCC 7th Edition     Clinical: Stage Unknown ([place], N2b, M1) - Unsigned     Carcinoid tumor of colon, malignant ([place])  02/16/2015 Imaging   CT abdomen and pelvis with contrast showed wall thickening of the cecum and terminal ileum high suspicious for cecal neoplasm. Extensive confluent ileal colic adenopathy extending chronically along the superior mesenteric vein, multiple hepatic metastasis   02/18/2015 Initial Diagnosis   Carcinoid tumor of colon, malignant, with liver metastasis   02/18/2015 Initial Biopsy   Liver biopsy showed metastatic low-grade neuroendocrine tumor (carcinoid). The tumor is positive for CD X2, CD 56, chromogranin, and synaptophysin.   03/05/2015 - 07/24/2018 Chemotherapy   Sandostatin 30 mg IM every 4 weeks. Due to disease progression regular monthly injections stopped after 07/24/18   03/06/2015 Imaging   CT chest showed no evidence of metastasis in the chest. 4.4 cm ascending aortic aneurysm.   09/09/2015 Imaging   Restaging CT abdomen and pelvis with contrast showed interval response of hepatic metastasis. Most are small and, some are stable. Slight interval decrease in mesenteric adenopathy and primary colon tumor.    12/06/2016 Imaging   CT CAP w contrast IMPRESSION: 1. Overall, the extent of metastatic disease to the liver and abdominal/retroperitoneal lymphatics is similar to the prior examination 08/19/2016. 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 2. Multiple small pulmonary  nodules appear unchanged in size, number and distribution, favored to be benign. 3. Aortic atherosclerosis, in addition to 2 vessel coronary artery disease. Assessment for potential risk factor modification, dietary therapy or pharmacologic therapy may be warranted, if clinically indicated. 4. In addition, there is dilatation of the aortic root (4.7 cm in diameter at the level of the sinuses of Valsalva) and ectasia of the ascending thoracic aorta measuring 4.4 cm in diameter. Recommend annual imaging followup by CTA or MRA. This recommendation follows 2010 ACCF/AHA/AATS/ACR/ASA/SCA/SCAI/SIR/STS/SVM Guidelines for the Diagnosis and Management of Patients with Thoracic Aortic Disease. Circulation. 2010; 121: [id number]. 5. Trace volume of ascites. 6. Additional incidental findings, as above.    05/31/2017 Imaging   CT CAP W Contrast 05/31/17 IMPRESSION: 1. Overall the extent of metastatic disease to the liver and abdominal adenopathy is similar to previous exam from 12/06/2016. 2. 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 3. Stable small pulmonary nodules. 4.  Aortic Atherosclerosis (ICD10-I70.0). 5. Dilatation of the ascending thoracic aorta. Unchanged. Recommend annual imaging followup by CTA or MRA. This recommendation follows 2010 ACCF/AHA/AATS/ACR/ASA/SCA/SCAI/SIR/STS/SVM Guidelines for the Diagnosis and Management of Patients with Thoracic Aortic Disease. Circulation. 2010; 121: [id number] 6. Trace ascites.   12/07/2017 Imaging   CT CAP W Contrast 12/07/17 IMPRESSION: Chest Impression: 1. No evidence of thoracic metastasis. 2. Stable ascending thoracic aortic aneurysm at 4.4 cm. Recommend attention on oncology surveillance. Abdomen / Pelvis Impression: 1. Mild interval increase in size of multifocal hepatic metastasis. 2. Large central small bowel mesenteric mass is unchanged 3. No bowel obstruction. 4. No  skeletal lesions    06/12/2018 Imaging   06/12/2018  CT AP IMPRESSION: Large mesenteric mass in the right mid abdomen, increased.  Multifocal hepatic metastases, increased.  Mild upper abdominal lymphadenopathy, mildly increased.  Masslike thickening involving the terminal ileum, suspicious for metastasis, similar to the prior. Additional nonspecific wall thickening involving multiple loops of small bowel in the pelvis.  Small volume pelvic ascites, mildly increased.   06/12/2018 Progression   IMPRESSION: Large mesenteric mass in the right mid abdomen, increased.  Multifocal hepatic metastases, increased.  Mild upper abdominal lymphadenopathy, mildly increased.  Masslike thickening involving the terminal ileum, suspicious for metastasis, similar to the prior. Additional nonspecific wall thickening involving multiple loops of small bowel in the pelvis.  Small volume pelvic ascites, mildly increased.   07/18/2018 PET scan   IMPRESSION: 1. Examination is positive for extensive neuroendocrine tumor receptor avid metastatic disease involving the liver, axial and appendicular skeleton, thoracic and abdominal lymph nodes. There is a dominant mass within the mesentery which is intensely radiotracer avid with a maximum dimension of 11.9 cm and an SUV max of 44.32. Within the distal small bowel loops there is a radiotracer avid lesion likely within the distal ileum. 2. When compared with CT from 06/13/2018 the tumor volume within the abdomen is not significantly changed in the interval. 3.  Aortic Atherosclerosis (ICD10-I70.0). 4. Coronary artery atherosclerotic calcifications 5. Nonobstructing left renal calculi   08/21/2018 - 02/05/2019 Chemotherapy   Lutathera treatment with Sandostatin injection 08/21/18, 10/16/18, 12/11/18, 02/05/19   03/12/2019 PET scan   IMPRESSION: 1. Marked interval positive response to peptide receptor radiotherapy. 2. Significant decrease in size and  radiotracer activity within liver metastasis. 3. Significant decrease in size and radiotracer activity of large peritoneal mass in RIGHT lower quadrant. 4. Decrease in size and radiotracer activity of retroperitoneal adenopathy in the upper abdomen. 5. Decrease in size and radiotracer activity or resolution of activity of the small skeletal metastasis.     05/22/2019 - 07/19/2019 Chemotherapy   Monthly Sandostatin injections restarted on 05/22/19-07/19/19. Did not improve symptoms of diarrhea, fatigue and did not improve which is likely related to his persistent high tumor burden.    06/17/2019 Imaging   CT AP W Contast  IMPRESSION: Multifocal hepatic metastases, unchanged from recent PET.   Mesenteric nodal metastasis in the right mid abdomen, unchanged from recent PET.   Stable wall thickening involving the distal ileum.   No focal osseous metastases are evident on CT.   Additional ancillary findings as above.     09/17/2019 -  Chemotherapy   Third line therapy oral Afinitor 7.5mg  daily starting 09/17/19. Increased to full dose 10mg  starting 12/11/19   12/12/2019 Imaging   CT AP W Contrast IMPRESSION: 1. Liver metastases are all mildly decreased. 2. Conglomerate nodal mass in the right mesentery is mildly decreased. 3. No new or progressive metastatic disease in the abdomen or pelvis. 4. Chronic wall thickening in the distal and terminal ileum and cecum surrounding the ileocecal valve, unchanged. No evidence of bowel obstruction. 5. Aortic Atherosclerosis (ICD10-I70.0).      CURRENT THERAPY:  Third line therapyoralAfinitor7.5mg  dailystarting 09/17/19. Increased to full dose 10mg  starting 12/11/19 Xgeva monthly starting 10/17/19. Due to hypocalcemia will reduce to q55months starting 12/13/19.   INTERVAL HISTORY:  Ryan Cowan is here for a follow up of treatment. He was presents to the clinic with his sister. He notes he received his first COVID19 vaccine and only  had arm soreness. He notes his energy level fluctuates. He often takes morning nap after breakfast and can do  more afterward. His sister notes he does not breath well when he sleeps and suspects this interrupts his sleep due to choking. He was referred for sleep study before but was not contacted to schedule. He notes he is tolerating Afinitor well. He notes he is on oral calcium now.    REVIEW OF SYSTEMS:   Constitutional: Denies fevers, chills or abnormal weight loss (+) Trouble sleeping (+) Improved fatigue  Eyes: Denies blurriness of vision Ears, nose, mouth, throat, and face: Denies mucositis or sore throat Respiratory: Denies cough, dyspnea or wheezes Cardiovascular: Denies palpitation, chest discomfort or lower extremity swelling Gastrointestinal:  Denies nausea, heartburn or change in bowel habits Skin: Denies abnormal skin rashes Lymphatics: Denies new lymphadenopathy or easy bruising Neurological:Denies numbness, tingling or new weaknesses Behavioral/Psych: Mood is stable, no new changes  All other systems were reviewed with the patient and are negative.  MEDICAL HISTORY:  Past Medical History:  Diagnosis Date  . Colon cancer ([place]) dx'd 02/2015  . Liver metastases ([place]) dx'd 02/2015  . Metastatic cancer to bone [facility]) dx'd 2019    SURGICAL HISTORY: Past Surgical History:  Procedure Laterality Date  . APPENDECTOMY    . KNEE SURGERY    . SHOULDER SURGERY      I have reviewed the social history and family history with the patient and they are unchanged from previous note.  ALLERGIES:  is allergic to codeine.  MEDICATIONS:  Current Outpatient Medications  Medication Sig Dispense Refill  . calcium-vitamin D (OSCAL WITH D) 500-200 MG-UNIT tablet Take 2 tablets by mouth daily with breakfast.    . cyanocobalamin 2000 MCG tablet Take 1,000 mcg by mouth daily.    Marland Kitchen everolimus (AFINITOR) 10 MG tablet Take 1 tablet (10 mg total) by mouth daily. 30 tablet 2  . Multiple Vitamin  (MULTIVITAMIN WITH MINERALS) TABS tablet Take 1 tablet by mouth daily. Centrum Silver 50 plus    . naproxen sodium (ANAPROX) 220 MG tablet Take 220 mg by mouth 2 (two) times daily as needed (pain).    Marland Kitchen OVER THE COUNTER MEDICATION Place 1 application into both nostrils daily as needed (congestion). Vicks inhaler stick    . sertraline (ZOLOFT) 100 MG tablet Take 100 mg by mouth daily.      No current facility-administered medications for this visit.    PHYSICAL EXAMINATION: ECOG PERFORMANCE STATUS: 1 - Symptomatic but completely ambulatory  Vitals:   02/14/20 1344 02/14/20 1346  BP: (!) 180/93 (!) 175/96  Pulse: 61   Resp: 17   Temp: 98.5 F (36.9 C)   SpO2: 93%    Filed Weights   02/14/20 1344  Weight: 155 lb 6.4 oz (70.5 kg)    Due to COVID19 we will limit examination to appearance. Patient had no complaints.  GENERAL:alert, no distress and comfortable SKIN: skin color normal, no rashes or significant lesions EYES: normal, Conjunctiva are pink and non-injected, sclera clear  NEURO: alert & oriented x 3 with fluent speech   LABORATORY DATA:  I have reviewed the data as listed CBC Latest Ref Rng & Units 02/14/2020 12/12/2019 11/18/2019  WBC 4.0 - 10.5 K/uL 2.9(L) 2.6(L) 3.0(L)  Hemoglobin 13.0 - 17.0 g/dL 10.7(L) 10.6(L) 10.6(L)  Hematocrit 39.0 - 52.0 % 31.7(L) 31.2(L) 30.8(L)  Platelets 150 - 400 K/uL 102(L) 90(L) 93(L)     CMP Latest Ref Rng & Units 02/14/2020 12/12/2019 11/18/2019  Glucose 70 - 99 mg/dL 86 89 93  BUN 8 - 23 mg/dL 15 16 15   Creatinine 0.61 -  1.24 mg/dL 0.81 0.79 0.75  Sodium 135 - 145 mmol/L 140 142 141  Potassium 3.5 - 5.1 mmol/L 3.9 3.7 3.8  Chloride 98 - 111 mmol/L 105 104 109  CO2 22 - 32 mmol/L 27 28 26   Calcium 8.9 - 10.3 mg/dL 9.0 8.5(L) 8.3(L)  Total Protein 6.5 - 8.1 g/dL 6.2(L) 5.8(L) 6.1(L)  Total Bilirubin 0.3 - 1.2 mg/dL 0.3 0.3 0.3  Alkaline Phos 38 - 126 U/L 88 83 94  AST 15 - 41 U/L 24 23 27   ALT 0 - 44 U/L 23 28 35       RADIOGRAPHIC STUDIES: I have personally reviewed the radiological images as listed and agreed with the findings in the report. No results found.   ASSESSMENT & PLAN:  DANGER MORSCH is a 74 y.o. male with    1. 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 Hewas first treated with Sandostatin injections since 03/2015. He had disease progression in 9/2019with bone mets in spine, pelvic bone and sternum. -Hehas completed 5 treatments of Lutatherawith Dr. Leonia Reeves on 08/21/18-02/05/19. -DOTATATE PET scan from 03/12/19 shows very good response to [place] treatment butunlikely cured from this alone .  -We restarted monthly Sandostatin injections on 05/22/19 through 07/19/19 but symptoms ofdiarrhea, fatigue anddid not improvewhich islikely related to his persistent high tumor burden. -Istarted him onthird line therapyoralAfinitor7.5mg  dailybeginning 09/17/19.Tolerating well.Increased to full dose 10mg  starting 12/11/19  -He is clinically doing well. His energy has improved and he is doing more lately given better mood. Labs reviewed and adequate to continue Afinitor 10mg  daily.  -Proceed with Xgeva today and continue q38months.   -f/u in 2 months and order next scan at that time to be done in 06/2020.    2. Diarrhea, Fatigue, RLQ pain  -These symptoms persisted even when off treatment. This is likely related to his carcinoid tumor.  -He has seen PT who has instructed him on exercises and encouraged him to walk more. -He has been taking imodium. He was encouraged to increase up to 8 tablets of imodium to control his diarrhea. I encouraged him to watch for constipation. -His fatigue and fluctuating Diarrhea is mostly stable. He will normally have BM 2-3 times a day. Overall energy has improved on Afinitor and with improved mood. I encouraged him to remain active but avoid falls.   3. Bone mets, Mild  Hypocalcemia -first noticed on PET scan in 07/2018 -To reduce his risk of fractureIstarted him onmonthly Xgeva on1/14/21, tolerated well.He has full dentures in place, I encouraged him to watch for bone exposure.  -He will continue oral calcium and Vit D. Due to hypocalcemia, I advised him to increase oral calcium doseto 3 tabs daily and I will reduce Xgeva to every 2 months starting 12/13/19.  -Calcium normalized on increased oral calcium, will continue.   4. 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 -Stable lately.  5. Possible right inguinal hernia  -Patient complains of having a "knot" in the groin area. -I have previously dicussed with the patient and we have agreed to just continue watching the area as long as no pain is present. -Stable with no pain  6. Anxiety/Depressionand fatigue -Has been anxious lately. He had not wanted to stay home alone. His sisters would stay with him even  overnight and required ED visit.  -On Zoloft100mg  currently, Managed by Dr. Arelia Sneddon -[place] continuespiritual counselingand speaking with SW as needed. -overall much improved. He is able to do more solo activities lately. Continue Zoloft 100mg .  7. Pancytopenia -Patient has developed mild pancytopenia since Lutathera treatment,probably related to the treatment and is diffuse bone metastasis -We also discussed the small possibility of MDS and leukemia after Lutathera therapy, will continue monitoring. -Stable.   8. Possible Sleep Apnea, Trouble Sleeping  -Per pt and family he has periods of no breathing when sleep along with snoring. This does lead to interrupted sleep with choking.  -He was referred for sleep study in the past, but was not scheduled. I recommend he f/u with PCP about this  referral to have this done.    Plan -Proceed with Xgeva injection today.  -Lab, F/u and Xgeva in 2 months  -Continue Afinitor 10mg  daily   No problem-specific Assessment & Plan notes found for this encounter.   No orders of the defined types were placed in this encounter.  All questions were answered. The patient knows to call the clinic with any problems, questions or concerns. No barriers to learning was detected. The total time spent in the appointment was 30 minutes.     [name], MD 02/14/2020   I, Joslyn Devon, am acting as scribe for [name], MD.   I have reviewed the above documentation for accuracy and completeness, and I agree with the above.

## 2020-02-14 ENCOUNTER — Inpatient Hospital Stay: Payer: Medicare Other | Attending: Hematology

## 2020-02-14 ENCOUNTER — Inpatient Hospital Stay (HOSPITAL_BASED_OUTPATIENT_CLINIC_OR_DEPARTMENT_OTHER): Payer: Medicare Other | Admitting: Hematology

## 2020-02-14 ENCOUNTER — Other Ambulatory Visit: Payer: Self-pay

## 2020-02-14 ENCOUNTER — Telehealth: Payer: Self-pay | Admitting: Hematology

## 2020-02-14 ENCOUNTER — Inpatient Hospital Stay: Payer: Medicare Other

## 2020-02-14 ENCOUNTER — Encounter: Payer: Self-pay | Admitting: Hematology

## 2020-02-14 VITALS — BP 175/96 | HR 61 | Temp 98.5°F | Resp 17 | Ht 73.0 in | Wt 155.4 lb

## 2020-02-14 DIAGNOSIS — C7951 Secondary malignant neoplasm of bone: Secondary | ICD-10-CM

## 2020-02-14 DIAGNOSIS — C7A029 Malignant carcinoid tumor of the large intestine, unspecified portion: Secondary | ICD-10-CM | POA: Diagnosis not present

## 2020-02-14 DIAGNOSIS — D500 Iron deficiency anemia secondary to blood loss (chronic): Secondary | ICD-10-CM

## 2020-02-14 DIAGNOSIS — Z5111 Encounter for antineoplastic chemotherapy: Secondary | ICD-10-CM | POA: Diagnosis not present

## 2020-02-14 LAB — COMPREHENSIVE METABOLIC PANEL
ALT: 23 U/L (ref 0–44)
AST: 24 U/L (ref 15–41)
Albumin: 3.3 g/dL — ABNORMAL LOW (ref 3.5–5.0)
Alkaline Phosphatase: 88 U/L (ref 38–126)
Anion gap: 8 (ref 5–15)
BUN: 15 mg/dL (ref 8–23)
CO2: 27 mmol/L (ref 22–32)
Calcium: 9 mg/dL (ref 8.9–10.3)
Chloride: 105 mmol/L (ref 98–111)
Creatinine, Ser: 0.81 mg/dL (ref 0.61–1.24)
GFR calc Af Amer: >60 mL/min (ref >60)
GFR calc non Af Amer: >60 mL/min (ref >60)
Glucose, Bld: 86 mg/dL (ref 70–99)
Potassium: 3.9 mmol/L (ref 3.5–5.1)
Sodium: 140 mmol/L (ref 135–145)
Total Bilirubin: 0.3 mg/dL (ref 0.3–1.2)
Total Protein: 6.2 g/dL — ABNORMAL LOW (ref 6.5–8.1)

## 2020-02-14 LAB — CBC WITH DIFFERENTIAL/PLATELET
Abs Immature Granulocytes: 0.01 10*3/uL (ref 0.00–0.07)
Basophils Absolute: 0 10*3/uL (ref 0.0–0.1)
Basophils Relative: 1 %
Eosinophils Absolute: 0.1 10*3/uL (ref 0.0–0.5)
Eosinophils Relative: 3 %
HCT: 31.7 % — ABNORMAL LOW (ref 39.0–52.0)
Hemoglobin: 10.7 g/dL — ABNORMAL LOW (ref 13.0–17.0)
Immature Granulocytes: 0 %
Lymphocytes Relative: 14 %
Lymphs Abs: 0.4 10*3/uL — ABNORMAL LOW (ref 0.7–4.0)
MCH: 32.2 pg (ref 26.0–34.0)
MCHC: 33.8 g/dL (ref 30.0–36.0)
MCV: 95.5 fL (ref 80.0–100.0)
Monocytes Absolute: 0.5 10*3/uL (ref 0.1–1.0)
Monocytes Relative: 17 %
Neutro Abs: 1.9 10*3/uL (ref 1.7–7.7)
Neutrophils Relative %: 65 %
Platelets: 102 10*3/uL — ABNORMAL LOW (ref 150–400)
RBC: 3.32 MIL/uL — ABNORMAL LOW (ref 4.22–5.81)
RDW: 12.4 % (ref 11.5–15.5)
WBC: 2.9 10*3/uL — ABNORMAL LOW (ref 4.0–10.5)
nRBC: 0 % (ref 0.0–0.2)

## 2020-02-14 LAB — VITAMIN D 25 HYDROXY (VIT D DEFICIENCY, FRACTURES): Vit D, 25-Hydroxy: 21.94 ng/mL — ABNORMAL LOW (ref 30–100)

## 2020-02-14 LAB — FERRITIN: Ferritin: 67 ng/mL (ref 24–336)

## 2020-02-14 MED ORDER — DENOSUMAB 120 MG/1.7ML ~~LOC~~ SOLN
120.0000 mg | Freq: Once | SUBCUTANEOUS | Status: AC
  Administered 2020-02-14: 120 mg via SUBCUTANEOUS

## 2020-02-14 MED ORDER — DENOSUMAB 120 MG/1.7ML ~~LOC~~ SOLN
SUBCUTANEOUS | Status: AC
  Filled 2020-02-14: qty 1.7

## 2020-02-14 NOTE — Telephone Encounter (Signed)
Scheduled per 5/14 los. Printed AVS and calendar for pt.

## 2020-02-14 NOTE — Patient Instructions (Signed)
Denosumab injection What is this medicine? DENOSUMAB (den oh sue mab) slows bone breakdown. Prolia is used to treat osteoporosis in women after menopause and in men, and in people who are taking corticosteroids for 6 months or more. Xgeva is used to treat a high calcium level due to cancer and to prevent bone fractures and other bone problems caused by multiple myeloma or cancer bone metastases. Xgeva is also used to treat giant cell tumor of the bone. This medicine may be used for other purposes; ask your health care provider or pharmacist if you have questions. COMMON BRAND NAME(S): Prolia, XGEVA What should I tell my health care provider before I take this medicine? They need to know if you have any of these conditions:  dental disease  having surgery or tooth extraction  infection  kidney disease  low levels of calcium or Vitamin D in the blood  malnutrition  on hemodialysis  skin conditions or sensitivity  thyroid or parathyroid disease  an unusual reaction to denosumab, other medicines, foods, dyes, or preservatives  pregnant or trying to get pregnant  breast-feeding How should I use this medicine? This medicine is for injection under the skin. It is given by a health care professional in a hospital or clinic setting. A special MedGuide will be given to you before each treatment. Be sure to read this information carefully each time. For Prolia, talk to your pediatrician regarding the use of this medicine in children. Special care may be needed. For Xgeva, talk to your pediatrician regarding the use of this medicine in children. While this drug may be prescribed for children as young as 13 years for selected conditions, precautions do apply. Overdosage: If you think you have taken too much of this medicine contact a poison control center or emergency room at once. NOTE: This medicine is only for you. Do not share this medicine with others. What if I miss a dose? It is  important not to miss your dose. Call your doctor or health care professional if you are unable to keep an appointment. What may interact with this medicine? Do not take this medicine with any of the following medications:  other medicines containing denosumab This medicine may also interact with the following medications:  medicines that lower your chance of fighting infection  steroid medicines like prednisone or cortisone This list may not describe all possible interactions. Give your health care provider a list of all the medicines, herbs, non-prescription drugs, or dietary supplements you use. Also tell them if you smoke, drink alcohol, or use illegal drugs. Some items may interact with your medicine. What should I watch for while using this medicine? Visit your doctor or health care professional for regular checks on your progress. Your doctor or health care professional may order blood tests and other tests to see how you are doing. Call your doctor or health care professional for advice if you get a fever, chills or sore throat, or other symptoms of a cold or flu. Do not treat yourself. This drug may decrease your body's ability to fight infection. Try to avoid being around people who are sick. You should make sure you get enough calcium and vitamin D while you are taking this medicine, unless your doctor tells you not to. Discuss the foods you eat and the vitamins you take with your health care professional. See your dentist regularly. Brush and floss your teeth as directed. Before you have any dental work done, tell your dentist you are   receiving this medicine. Do not become pregnant while taking this medicine or for 5 months after stopping it. Talk with your doctor or health care professional about your birth control options while taking this medicine. Women should inform their doctor if they wish to become pregnant or think they might be pregnant. There is a potential for serious side  effects to an unborn child. Talk to your health care professional or pharmacist for more information. What side effects may I notice from receiving this medicine? Side effects that you should report to your doctor or health care professional as soon as possible:  allergic reactions like skin rash, itching or hives, swelling of the face, lips, or tongue  bone pain  breathing problems  dizziness  jaw pain, especially after dental work  redness, blistering, peeling of the skin  signs and symptoms of infection like fever or chills; cough; sore throat; pain or trouble passing urine  signs of low calcium like fast heartbeat, muscle cramps or muscle pain; pain, tingling, numbness in the hands or feet; seizures  unusual bleeding or bruising  unusually weak or tired Side effects that usually do not require medical attention (report to your doctor or health care professional if they continue or are bothersome):  constipation  diarrhea  headache  joint pain  loss of appetite  muscle pain  runny nose  tiredness  upset stomach This list may not describe all possible side effects. Call your doctor for medical advice about side effects. You may report side effects to FDA at 1-800-FDA-1088. Where should I keep my medicine? This medicine is only given in a clinic, doctor's office, or other health care setting and will not be stored at home. NOTE: This sheet is a summary. It may not cover all possible information. If you have questions about this medicine, talk to your doctor, pharmacist, or health care provider.  2020 Elsevier/Gold Standard (2018-01-26 16:10:44)

## 2020-02-17 LAB — CHROMOGRANIN A: Chromogranin A (ng/mL): 461.8 ng/mL — ABNORMAL HIGH (ref 0.0–101.8)

## 2020-02-18 ENCOUNTER — Telehealth: Payer: Self-pay | Admitting: Emergency Medicine

## 2020-02-18 LAB — 5 HIAA, QUANTITATIVE, URINE, 24 HOUR
5-HIAA, Ur: 10.4 mg/L
5-HIAA,Quant.,24 Hr Urine: 22.4 mg/24 hr — ABNORMAL HIGH (ref 0.0–14.9)
Total Volume: 2150

## 2020-02-18 NOTE — Telephone Encounter (Addendum)
Pt verbalized understanding and will increase intake of VitD. He also states that he did receive his second covid vaccine on 02/17/20.    ----- Message from [name], MD sent at 02/18/2020 10:54 AM EDT ----- Please let pt know that his tumor marker was stable last week, VitD level low, I recommend increase VitD if he is already on (double it), or at least 2000u daily if he is not on. Iron level normal, No other concerns.   Thanks  [name]

## 2020-03-30 ENCOUNTER — Other Ambulatory Visit: Payer: Self-pay

## 2020-03-30 DIAGNOSIS — C7A029 Malignant carcinoid tumor of the large intestine, unspecified portion: Secondary | ICD-10-CM

## 2020-03-30 MED ORDER — EVEROLIMUS 10 MG PO TABS: 10.0000 mg | ORAL_TABLET | Freq: Every day | ORAL | 5 refills | Status: DC

## 2020-04-10 NOTE — Progress Notes (Signed)
[place]   [phone] [phone]   Clinic Follow up Note   Patient Care Team: Leonard Downing, MD as PCP - General (Family Medicine)  Date of Service:  04/15/2020  CHIEF COMPLAINT: F/u of Metastatic Carcinoid cancer  SUMMARY OF ONCOLOGIC HISTORY: Oncology History Overview Note  Carcinoid tumor of colon, malignant   Staging form: Colon and Rectum, AJCC 7th Edition     Clinical: Stage Unknown ([place], N2b, M1) - Unsigned     Carcinoid tumor of colon, malignant ([place])  02/16/2015 Imaging   CT abdomen and pelvis with contrast showed wall thickening of the cecum and terminal ileum high suspicious for cecal neoplasm. Extensive confluent ileal colic adenopathy extending chronically along the superior mesenteric vein, multiple hepatic metastasis   02/18/2015 Initial Diagnosis   Carcinoid tumor of colon, malignant, with liver metastasis   02/18/2015 Initial Biopsy   Liver biopsy showed metastatic low-grade neuroendocrine tumor (carcinoid). The tumor is positive for CD X2, CD 56, chromogranin, and synaptophysin.   03/05/2015 - 07/24/2018 Chemotherapy   Sandostatin 30 mg IM every 4 weeks. Due to disease progression regular monthly injections stopped after 07/24/18   03/06/2015 Imaging   CT chest showed no evidence of metastasis in the chest. 4.4 cm ascending aortic aneurysm.   09/09/2015 Imaging   Restaging CT abdomen and pelvis with contrast showed interval response of hepatic metastasis. Most are small and, some are stable. Slight interval decrease in mesenteric adenopathy and primary colon tumor.    12/06/2016 Imaging   CT CAP w contrast IMPRESSION: 1. Overall, the extent of metastatic disease to the liver and abdominal/retroperitoneal lymphatics is similar to the prior examination 08/19/2016. 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 2. Multiple small pulmonary  nodules appear unchanged in size, number and distribution, favored to be benign. 3. Aortic atherosclerosis, in addition to 2 vessel coronary artery disease. Assessment for potential risk factor modification, dietary therapy or pharmacologic therapy may be warranted, if clinically indicated. 4. In addition, there is dilatation of the aortic root (4.7 cm in diameter at the level of the sinuses of Valsalva) and ectasia of the ascending thoracic aorta measuring 4.4 cm in diameter. Recommend annual imaging followup by CTA or MRA. This recommendation follows 2010 ACCF/AHA/AATS/ACR/ASA/SCA/SCAI/SIR/STS/SVM Guidelines for the Diagnosis and Management of Patients with Thoracic Aortic Disease. Circulation. 2010; 121: Z601-U932. 5. Trace volume of ascites. 6. Additional incidental findings, as above.    05/31/2017 Imaging   CT CAP W Contrast 05/31/17 IMPRESSION: 1. Overall the extent of metastatic disease to the liver and abdominal adenopathy is similar to previous exam from 12/06/2016. 2. 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 3. Stable small pulmonary nodules. 4.  Aortic Atherosclerosis (ICD10-I70.0). 5. Dilatation of the ascending thoracic aorta. Unchanged. Recommend annual imaging followup by CTA or MRA. This recommendation follows 2010 ACCF/AHA/AATS/ACR/ASA/SCA/SCAI/SIR/STS/SVM Guidelines for the Diagnosis and Management of Patients with Thoracic Aortic Disease. Circulation. 2010; 121: e266-e369 6. Trace ascites.   12/07/2017 Imaging   CT CAP W Contrast 12/07/17 IMPRESSION: Chest Impression: 1. No evidence of thoracic metastasis. 2. Stable ascending thoracic aortic aneurysm at 4.4 cm. Recommend attention on oncology surveillance. Abdomen / Pelvis Impression: 1. Mild interval increase in size of multifocal hepatic metastasis. 2. Large central small bowel mesenteric mass is unchanged 3. No bowel obstruction. 4. No  skeletal lesions    06/12/2018 Imaging   06/12/2018  CT AP IMPRESSION: Large mesenteric mass in the right mid abdomen, increased.  Multifocal hepatic metastases, increased.  Mild upper abdominal lymphadenopathy, mildly increased.  Masslike thickening involving the terminal ileum, suspicious for metastasis, similar to the prior. Additional nonspecific wall thickening involving multiple loops of small bowel in the pelvis.  Small volume pelvic ascites, mildly increased.   06/12/2018 Progression   IMPRESSION: Large mesenteric mass in the right mid abdomen, increased.  Multifocal hepatic metastases, increased.  Mild upper abdominal lymphadenopathy, mildly increased.  Masslike thickening involving the terminal ileum, suspicious for metastasis, similar to the prior. Additional nonspecific wall thickening involving multiple loops of small bowel in the pelvis.  Small volume pelvic ascites, mildly increased.   07/18/2018 PET scan   IMPRESSION: 1. Examination is positive for extensive neuroendocrine tumor receptor avid metastatic disease involving the liver, axial and appendicular skeleton, thoracic and abdominal lymph nodes. There is a dominant mass within the mesentery which is intensely radiotracer avid with a maximum dimension of 11.9 cm and an SUV max of 44.32. Within the distal small bowel loops there is a radiotracer avid lesion likely within the distal ileum. 2. When compared with CT from 06/13/2018 the tumor volume within the abdomen is not significantly changed in the interval. 3.  Aortic Atherosclerosis (ICD10-I70.0). 4. Coronary artery atherosclerotic calcifications 5. Nonobstructing left renal calculi   08/21/2018 - 02/05/2019 Chemotherapy   Lutathera treatment with Sandostatin injection 08/21/18, 10/16/18, 12/11/18, 02/05/19   03/12/2019 PET scan   IMPRESSION: 1. Marked interval positive response to peptide receptor radiotherapy. 2. Significant decrease in size and  radiotracer activity within liver metastasis. 3. Significant decrease in size and radiotracer activity of large peritoneal mass in RIGHT lower quadrant. 4. Decrease in size and radiotracer activity of retroperitoneal adenopathy in the upper abdomen. 5. Decrease in size and radiotracer activity or resolution of activity of the small skeletal metastasis.     05/22/2019 - 07/19/2019 Chemotherapy   Monthly Sandostatin injections restarted on 05/22/19-07/19/19. Did not improve symptoms of diarrhea, fatigue and did not improve which is likely related to his persistent high tumor burden.    06/17/2019 Imaging   CT AP W Contast  IMPRESSION: Multifocal hepatic metastases, unchanged from recent PET.   Mesenteric nodal metastasis in the right mid abdomen, unchanged from recent PET.   Stable wall thickening involving the distal ileum.   No focal osseous metastases are evident on CT.   Additional ancillary findings as above.     09/17/2019 -  Chemotherapy   Third line therapy oral Afinitor 7.5mg  daily starting 09/17/19. Increased to full dose 10mg  starting 12/11/19   12/12/2019 Imaging   CT AP W Contrast IMPRESSION: 1. Liver metastases are all mildly decreased. 2. Conglomerate nodal mass in the right mesentery is mildly decreased. 3. No new or progressive metastatic disease in the abdomen or pelvis. 4. Chronic wall thickening in the distal and terminal ileum and cecum surrounding the ileocecal valve, unchanged. No evidence of bowel obstruction. 5. Aortic Atherosclerosis (ICD10-I70.0).      CURRENT THERAPY:  Third line therapyoralAfinitor7.5mg  dailystarting 09/17/19. Increased to full dose 10mg starting 12/11/19 Xgeva monthly starting 10/17/19. Due to hypocalcemia will reduce to q50months starting 12/13/19.  INTERVAL HISTORY:  Ryan Cowan is here for a follow up and treatment. He presents to the clinic with his sister. He notes he is doing well. He notes he has been having LE  edema and went to see Dr Arelia Sneddon his PCP yesterday. His main edema is in right ankle, left calf  and foot. He has tried elevating his feet. He notes this has been going on for 2 weeks now. He notes Dr Arelia Sneddon thinks this is related to his Afinitor. He did not take Afinitor this morning and feels this has improved. He also notes hitting his head 4-5 days ago after missing a stair. He has mild bruise on the middle of his forehead. He notes prickly skin of his upper thigh occasionally and cold sensation of his Left lower leg.  He notes he still has diarrhea. He has been taking 2 imodium in the morning. He notes he is tolerating his Xgeva injection well.    REVIEW OF SYSTEMS:   Constitutional: Denies fevers, chills or abnormal weight loss Eyes: Denies blurriness of vision Ears, nose, mouth, throat, and face: Denies mucositis or sore throat Respiratory: Denies cough, dyspnea or wheezes Cardiovascular: Denies palpitation, chest discomfort (+) lower extremity swelling, L>R Gastrointestinal:  Denies nausea, heartburn (+) Stable diarrhea  Skin: Denies abnormal skin rashes Lymphatics: Denies new lymphadenopathy or easy bruising Neurological:Denies numbness, tingling or new weaknesses Behavioral/Psych: Mood is stable, no new changes  All other systems were reviewed with the patient and are negative.  MEDICAL HISTORY:  Past Medical History:  Diagnosis Date  . Colon cancer ([place]) dx'd 02/2015  . Liver metastases ([place]) dx'd 02/2015  . Metastatic cancer to bone [facility]) dx'd 2019    SURGICAL HISTORY: Past Surgical History:  Procedure Laterality Date  . APPENDECTOMY    . KNEE SURGERY    . SHOULDER SURGERY      I have reviewed the social history and family history with the patient and they are unchanged from previous note.  ALLERGIES:  is allergic to codeine.  MEDICATIONS:  Current Outpatient Medications  Medication Sig Dispense Refill  . calcium-vitamin D (OSCAL WITH D) 500-200 MG-UNIT tablet Take 2  tablets by mouth daily with breakfast.    . cyanocobalamin 2000 MCG tablet Take 1,000 mcg by mouth daily.    Marland Kitchen everolimus (AFINITOR) 10 MG tablet Take 1 tablet (10 mg total) by mouth daily. 30 tablet 5  . Multiple Vitamin (MULTIVITAMIN WITH MINERALS) TABS tablet Take 1 tablet by mouth daily. Centrum Silver 50 plus    . naproxen sodium (ANAPROX) 220 MG tablet Take 220 mg by mouth 2 (two) times daily as needed (pain).    Marland Kitchen OVER THE COUNTER MEDICATION Place 1 application into both nostrils daily as needed (congestion). Vicks inhaler stick    . sertraline (ZOLOFT) 100 MG tablet Take 100 mg by mouth daily.      No current facility-administered medications for this visit.    PHYSICAL EXAMINATION: ECOG PERFORMANCE STATUS: 1 - Symptomatic but completely ambulatory  Vitals:   04/15/20 1412  BP: (!) 141/81  Pulse: 70  Resp: 17  Temp: 98.1 F (36.7 C)  SpO2: 96%   Filed Weights   04/15/20 1412  Weight: 152 lb 3.2 oz (69 kg)    GENERAL:alert, no distress and comfortable SKIN: skin color, texture, turgor are normal, no rashes or significant lesions EYES: normal, Conjunctiva are pink and non-injected, sclera clear  NECK: supple, thyroid normal size, non-tender, without nodularity LYMPH:  no palpable lymphadenopathy in the cervical, axillary  LUNGS: clear to auscultation and percussion with normal breathing effort HEART: regular rate & rhythm and no murmurs (+) lower extremity edema, mainly in right ankle and left calf ABDOMEN:abdomen soft, non-tender and normal bowel sounds Musculoskeletal:no cyanosis of digits and no clubbing  NEURO: alert & oriented x 3 with  fluent speech, no focal motor/sensory deficits  LABORATORY DATA:  I have reviewed the data as listed CBC Latest Ref Rng & Units 04/15/2020 02/14/2020 12/12/2019  WBC 4.0 - 10.5 K/uL 3.2(L) 2.9(L) 2.6(L)  Hemoglobin 13.0 - 17.0 g/dL 10.5(L) 10.7(L) 10.6(L)  Hematocrit 39 - 52 % 31.1(L) 31.7(L) 31.2(L)  Platelets 150 - 400 K/uL 114(L)  102(L) 90(L)     CMP Latest Ref Rng & Units 04/15/2020 02/14/2020 12/12/2019  Glucose 70 - 99 mg/dL 94 86 89  BUN 8 - 23 mg/dL 13 15 16   Creatinine 0.61 - 1.24 mg/dL 0.82 0.81 0.79  Sodium 135 - 145 mmol/L 142 140 142  Potassium 3.5 - 5.1 mmol/L 3.8 3.9 3.7  Chloride 98 - 111 mmol/L 107 105 104  CO2 22 - 32 mmol/L 27 27 28   Calcium 8.9 - 10.3 mg/dL 9.1 9.0 8.5(L)  Total Protein 6.5 - 8.1 g/dL 6.3(L) 6.2(L) 5.8(L)  Total Bilirubin 0.3 - 1.2 mg/dL 0.3 0.3 0.3  Alkaline Phos 38 - 126 U/L 89 88 83  AST 15 - 41 U/L 21 24 23   ALT 0 - 44 U/L 18 23 28       RADIOGRAPHIC STUDIES: I have personally reviewed the radiological images as listed and agreed with the findings in the report. No results found.   ASSESSMENT & PLAN:  Ryan Cowan is a 74 y.o. male with    1. 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 Hewas first treated with Sandostatin injections since 03/2015. He had disease progression in 9/2019with bone mets in spine, pelvic bone and sternum. -Hehas completed 5 treatments of Lutatherawith Dr. Leonia Reeves on 08/21/18-02/05/19. -DOTATATE PET scan from 03/12/19 shows very good response to [place] treatment butunlikely cured from this alone .  -We restarted monthly Sandostatin injections on 8/19/20through10/16/20 but symptoms ofdiarrhea, fatigue anddid not improvewhich islikely related to his persistent high tumor burden. -Istarted him onthird line therapyoralAfinitor7.5mg  dailybeginning 09/17/19.Tolerating well.Increased to full dose 10mg starting 12/11/19.  -He is stable. He does have recent LE edema. Will further evaluate with doppler. If this does not improve in the next few weeks will scan him sooner as this could be related to his tumor.  -Labs reviewed and stable with WBC 3.2, Hg 10.5, plt 114K, protein 6.3. Overall adequate to continue Afinitor 10mg  daily.  -Proceed with Xgeva today and continue  q87months.   -f/u in 2 months with CT scan a few days before.   2. B/l LE Edema, L>R -For the past 2 weeks he has had LE edema with main edema is in right ankle, left calf and foot. He has tried elevating his feet.  -He was seen by Dr Arelia Sneddon this week who thinks this is related to his Afinitor. He did not take Afinitor this morning and feels this has improved.  -I discussed if this is related to medication then the effect will likely be symmetric. I recommend doppler to evaluate for DVT. He is agreeable.  -I discussed with enlarge LNs in abdominal or his large abdominal mass could be obstructing or compressing his vessels leading to edema.  -I encouraged him to keep elevated his legs and wear compression socks during the day.    3. Bone mets, Mild Hypocalcemia -first noticed on PET scan in 07/2018 -To reduce his risk of fractureIstarted him onmonthly Xgeva on1/14/21, tolerated well.He has full dentures in place, I encouraged him to watch for bone exposure.  -Calcium normalized on increased oral calcium  TID, will continue along with Vit D.   4. 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 -Stable lately.  5. Possible right inguinal hernia  -Stable with no pain. Continue to monitor.   6. Anxiety/Depressionand fatigue -Has been anxious lately. He had not wanted to stay home alone. His sisters would stay with him even overnight and required ED visit.  -On Zoloft100mg  currently, Managed by Dr. Arelia Sneddon -[place] continuespiritual counselingand speaking with SW as needed. -overall much improved. He is able to do more solo activities lately. Continue Zoloft 100mg .  7. Pancytopenia -Patient has developed mild pancytopenia since Lutathera treatment,probably related to the treatment and is  diffuse bone metastasis -We also discussed the small possibility of MDS and leukemia after Lutathera therapy, will continue monitoring. -Stable and mild.    Plan -Left LE doppler tomorrow to rule out DVT, if it's negative, will hold Afinitor to see if his leg edema resolves  -Proceed with Xgeva injection today. -F/uand [place] See in 2 monthswith lab and CT CAP w contrast a few days before  -ContinueAfinitor 10mg  daily for now    No problem-specific Assessment & Plan notes found for this encounter.   Orders Placed This Encounter  Procedures  . CT Abdomen Pelvis W Contrast    Standing Status:   Future    Standing Expiration Date:   04/15/2021    Order Specific Question:   If indicated for the ordered procedure, I authorize the administration of contrast media per Radiology protocol    Answer:   Yes    Order Specific Question:   Preferred imaging location?    Answer:   [facility]    Order Specific Question:   Release to patient    Answer:   Immediate    Order Specific Question:   Is Oral Contrast requested for this exam?    Answer:   Yes, Per Radiology protocol    Order Specific Question:   Radiology Contrast Protocol - do NOT remove file path    Answer:   \\[email or web address]  . CT Chest W Contrast    Standing Status:   Future    Standing Expiration Date:   04/15/2021    Order Specific Question:   If indicated for the ordered procedure, I authorize the administration of contrast media per Radiology protocol    Answer:   Yes    Order Specific Question:   Preferred imaging location?    Answer:   [facility]    Order Specific Question:   Radiology Contrast Protocol - do NOT remove file path    Answer:   \\[email or web address]   All questions were answered. The patient knows to call the clinic with any problems, questions or concerns. No barriers to learning was detected. The total time spent in the appointment was 30  minutes.     [name], MD 04/15/2020   I, Joslyn Devon, am acting as scribe for [name], MD.   I have reviewed the above documentation for accuracy and completeness, and I agree with the above.

## 2020-04-15 ENCOUNTER — Other Ambulatory Visit: Payer: Self-pay

## 2020-04-15 ENCOUNTER — Inpatient Hospital Stay: Payer: Medicare Other | Attending: Hematology | Admitting: Hematology

## 2020-04-15 ENCOUNTER — Inpatient Hospital Stay: Payer: Medicare Other

## 2020-04-15 ENCOUNTER — Telehealth: Payer: Self-pay | Admitting: Hematology

## 2020-04-15 ENCOUNTER — Encounter: Payer: Self-pay | Admitting: Hematology

## 2020-04-15 VITALS — BP 141/81 | HR 70 | Temp 98.1°F | Resp 17 | Ht 73.0 in | Wt 152.2 lb

## 2020-04-15 DIAGNOSIS — D509 Iron deficiency anemia, unspecified: Secondary | ICD-10-CM | POA: Insufficient documentation

## 2020-04-15 DIAGNOSIS — C7951 Secondary malignant neoplasm of bone: Secondary | ICD-10-CM | POA: Diagnosis not present

## 2020-04-15 DIAGNOSIS — C772 Secondary and unspecified malignant neoplasm of intra-abdominal lymph nodes: Secondary | ICD-10-CM | POA: Insufficient documentation

## 2020-04-15 DIAGNOSIS — I2510 Atherosclerotic heart disease of native coronary artery without angina pectoris: Secondary | ICD-10-CM | POA: Insufficient documentation

## 2020-04-15 DIAGNOSIS — C787 Secondary malignant neoplasm of liver and intrahepatic bile duct: Secondary | ICD-10-CM | POA: Diagnosis not present

## 2020-04-15 DIAGNOSIS — R600 Localized edema: Secondary | ICD-10-CM | POA: Diagnosis not present

## 2020-04-15 DIAGNOSIS — F419 Anxiety disorder, unspecified: Secondary | ICD-10-CM | POA: Insufficient documentation

## 2020-04-15 DIAGNOSIS — C7A029 Malignant carcinoid tumor of the large intestine, unspecified portion: Secondary | ICD-10-CM

## 2020-04-15 DIAGNOSIS — D61818 Other pancytopenia: Secondary | ICD-10-CM | POA: Insufficient documentation

## 2020-04-15 DIAGNOSIS — Z5111 Encounter for antineoplastic chemotherapy: Secondary | ICD-10-CM | POA: Diagnosis not present

## 2020-04-15 DIAGNOSIS — Z85038 Personal history of other malignant neoplasm of large intestine: Secondary | ICD-10-CM | POA: Insufficient documentation

## 2020-04-15 DIAGNOSIS — D500 Iron deficiency anemia secondary to blood loss (chronic): Secondary | ICD-10-CM

## 2020-04-15 LAB — COMPREHENSIVE METABOLIC PANEL
ALT: 18 U/L (ref 0–44)
AST: 21 U/L (ref 15–41)
Albumin: 3.2 g/dL — ABNORMAL LOW (ref 3.5–5.0)
Alkaline Phosphatase: 89 U/L (ref 38–126)
Anion gap: 8 (ref 5–15)
BUN: 13 mg/dL (ref 8–23)
CO2: 27 mmol/L (ref 22–32)
Calcium: 9.1 mg/dL (ref 8.9–10.3)
Chloride: 107 mmol/L (ref 98–111)
Creatinine, Ser: 0.82 mg/dL (ref 0.61–1.24)
GFR calc Af Amer: >60 mL/min (ref >60)
GFR calc non Af Amer: >60 mL/min (ref >60)
Glucose, Bld: 94 mg/dL (ref 70–99)
Potassium: 3.8 mmol/L (ref 3.5–5.1)
Sodium: 142 mmol/L (ref 135–145)
Total Bilirubin: 0.3 mg/dL (ref 0.3–1.2)
Total Protein: 6.3 g/dL — ABNORMAL LOW (ref 6.5–8.1)

## 2020-04-15 LAB — CBC WITH DIFFERENTIAL/PLATELET
Abs Immature Granulocytes: 0.01 10*3/uL (ref 0.00–0.07)
Basophils Absolute: 0 10*3/uL (ref 0.0–0.1)
Basophils Relative: 1 %
Eosinophils Absolute: 0.1 10*3/uL (ref 0.0–0.5)
Eosinophils Relative: 2 %
HCT: 31.1 % — ABNORMAL LOW (ref 39.0–52.0)
Hemoglobin: 10.5 g/dL — ABNORMAL LOW (ref 13.0–17.0)
Immature Granulocytes: 0 %
Lymphocytes Relative: 14 %
Lymphs Abs: 0.4 10*3/uL — ABNORMAL LOW (ref 0.7–4.0)
MCH: 33 pg (ref 26.0–34.0)
MCHC: 33.8 g/dL (ref 30.0–36.0)
MCV: 97.8 fL (ref 80.0–100.0)
Monocytes Absolute: 0.6 10*3/uL (ref 0.1–1.0)
Monocytes Relative: 20 %
Neutro Abs: 2.1 10*3/uL (ref 1.7–7.7)
Neutrophils Relative %: 63 %
Platelets: 114 10*3/uL — ABNORMAL LOW (ref 150–400)
RBC: 3.18 MIL/uL — ABNORMAL LOW (ref 4.22–5.81)
RDW: 12.5 % (ref 11.5–15.5)
WBC: 3.2 10*3/uL — ABNORMAL LOW (ref 4.0–10.5)
nRBC: 0 % (ref 0.0–0.2)

## 2020-04-15 MED ORDER — DENOSUMAB 120 MG/1.7ML ~~LOC~~ SOLN
SUBCUTANEOUS | Status: AC
  Filled 2020-04-15: qty 1.7

## 2020-04-15 MED ORDER — DENOSUMAB 120 MG/1.7ML ~~LOC~~ SOLN
120.0000 mg | Freq: Once | SUBCUTANEOUS | Status: AC
  Administered 2020-04-15: 120 mg via SUBCUTANEOUS

## 2020-04-15 NOTE — Telephone Encounter (Signed)
Scheduled per 7/14 los. Printed avs and calendar for pt.

## 2020-04-16 ENCOUNTER — Ambulatory Visit (HOSPITAL_COMMUNITY)
Admission: RE | Admit: 2020-04-16 | Discharge: 2020-04-16 | Disposition: A | Discharge status: Home / Self Care | Payer: Medicare Other | Source: Ambulatory Visit | Attending: Hematology | Admitting: Hematology

## 2020-04-16 DIAGNOSIS — C7A029 Malignant carcinoid tumor of the large intestine, unspecified portion: Secondary | ICD-10-CM | POA: Diagnosis not present

## 2020-04-16 NOTE — Progress Notes (Signed)
Left lower extremity venous duplex completed. Refer to "CV Proc" under chart review to view preliminary results.  04/16/2020 11:52 AM Kelby Aline., MHA, RVT, RDCS, RDMS

## 2020-04-20 ENCOUNTER — Telehealth: Payer: Self-pay

## 2020-04-20 NOTE — Telephone Encounter (Signed)
Called Mr. Fitzpatrick and LM on confidential VM letting him know that he was negative for an blood clot/ DVT and that Dr. Burr Medico was not sure if his leg edema was due to the afinitor but to hold the medication until next visit. Encourage Mr. Erpelding to use compression stockings and elevate his legs as much as possible. Informed Mr. Schoeneck to CB with any questions or concerns.

## 2020-04-20 NOTE — Telephone Encounter (Signed)
-----   Message from [name], MD sent at 04/17/2020  8:11 PM EDT ----- Please let pt know that his [place] was negative for DVT. I am not sure if his leg edema is related to afinitor, but OK to hold Afinitor for now until next visit, encourage him to use compression stock, elevated let, etc. Thanks   [name]  04/17/2020

## 2020-06-09 ENCOUNTER — Encounter (HOSPITAL_COMMUNITY): Payer: Self-pay

## 2020-06-09 ENCOUNTER — Inpatient Hospital Stay: Payer: Medicare Other | Attending: Hematology

## 2020-06-09 ENCOUNTER — Other Ambulatory Visit: Payer: Self-pay

## 2020-06-09 ENCOUNTER — Ambulatory Visit (HOSPITAL_COMMUNITY)
Admission: RE | Admit: 2020-06-09 | Discharge: 2020-06-09 | Disposition: A | Discharge status: Home / Self Care | Payer: Medicare Other | Source: Ambulatory Visit | Attending: Hematology | Admitting: Hematology

## 2020-06-09 DIAGNOSIS — F419 Anxiety disorder, unspecified: Secondary | ICD-10-CM | POA: Insufficient documentation

## 2020-06-09 DIAGNOSIS — R5383 Other fatigue: Secondary | ICD-10-CM | POA: Insufficient documentation

## 2020-06-09 DIAGNOSIS — D509 Iron deficiency anemia, unspecified: Secondary | ICD-10-CM | POA: Insufficient documentation

## 2020-06-09 DIAGNOSIS — C7A029 Malignant carcinoid tumor of the large intestine, unspecified portion: Secondary | ICD-10-CM | POA: Insufficient documentation

## 2020-06-09 DIAGNOSIS — C787 Secondary malignant neoplasm of liver and intrahepatic bile duct: Secondary | ICD-10-CM | POA: Insufficient documentation

## 2020-06-09 DIAGNOSIS — R600 Localized edema: Secondary | ICD-10-CM | POA: Insufficient documentation

## 2020-06-09 DIAGNOSIS — Z5111 Encounter for antineoplastic chemotherapy: Secondary | ICD-10-CM | POA: Diagnosis not present

## 2020-06-09 DIAGNOSIS — C7951 Secondary malignant neoplasm of bone: Secondary | ICD-10-CM | POA: Insufficient documentation

## 2020-06-09 DIAGNOSIS — C772 Secondary and unspecified malignant neoplasm of intra-abdominal lymph nodes: Secondary | ICD-10-CM | POA: Diagnosis not present

## 2020-06-09 LAB — CBC WITH DIFFERENTIAL/PLATELET
Abs Immature Granulocytes: 0.01 10*3/uL (ref 0.00–0.07)
Basophils Absolute: 0 10*3/uL (ref 0.0–0.1)
Basophils Relative: 1 %
Eosinophils Absolute: 0.1 10*3/uL (ref 0.0–0.5)
Eosinophils Relative: 2 %
HCT: 33.6 % — ABNORMAL LOW (ref 39.0–52.0)
Hemoglobin: 11.3 g/dL — ABNORMAL LOW (ref 13.0–17.0)
Immature Granulocytes: 0 %
Lymphocytes Relative: 14 %
Lymphs Abs: 0.7 10*3/uL (ref 0.7–4.0)
MCH: 33 pg (ref 26.0–34.0)
MCHC: 33.6 g/dL (ref 30.0–36.0)
MCV: 98.2 fL (ref 80.0–100.0)
Monocytes Absolute: 0.7 10*3/uL (ref 0.1–1.0)
Monocytes Relative: 15 %
Neutro Abs: 3.5 10*3/uL (ref 1.7–7.7)
Neutrophils Relative %: 68 %
Platelets: 168 10*3/uL (ref 150–400)
RBC: 3.42 MIL/uL — ABNORMAL LOW (ref 4.22–5.81)
RDW: 13.9 % (ref 11.5–15.5)
WBC: 5.1 10*3/uL (ref 4.0–10.5)
nRBC: 0 % (ref 0.0–0.2)

## 2020-06-09 LAB — COMPREHENSIVE METABOLIC PANEL
ALT: 10 U/L (ref 0–44)
AST: 11 U/L — ABNORMAL LOW (ref 15–41)
Albumin: 3.3 g/dL — ABNORMAL LOW (ref 3.5–5.0)
Alkaline Phosphatase: 80 U/L (ref 38–126)
Anion gap: 5 (ref 5–15)
BUN: 18 mg/dL (ref 8–23)
CO2: 30 mmol/L (ref 22–32)
Calcium: 9.4 mg/dL (ref 8.9–10.3)
Chloride: 105 mmol/L (ref 98–111)
Creatinine, Ser: 0.98 mg/dL (ref 0.61–1.24)
GFR calc Af Amer: >60 mL/min (ref >60)
GFR calc non Af Amer: >60 mL/min (ref >60)
Glucose, Bld: 95 mg/dL (ref 70–99)
Potassium: 4.1 mmol/L (ref 3.5–5.1)
Sodium: 140 mmol/L (ref 135–145)
Total Bilirubin: 0.3 mg/dL (ref 0.3–1.2)
Total Protein: 6.3 g/dL — ABNORMAL LOW (ref 6.5–8.1)

## 2020-06-09 MED ORDER — IOHEXOL 300 MG/ML  SOLN
100.0000 mL | Freq: Once | INTRAMUSCULAR | Status: AC | PRN
  Administered 2020-06-09: 100 mL via INTRAVENOUS

## 2020-06-10 LAB — CHROMOGRANIN A: Chromogranin A (ng/mL): 649.6 ng/mL — ABNORMAL HIGH (ref 0.0–101.8)

## 2020-06-10 NOTE — Progress Notes (Signed)
[place]   [phone] Fax:([phone]   Clinic Follow up Note   Patient Care Team: Leonard Downing, MD as PCP - General (Family Medicine)  Date of Service:  06/11/2020  CHIEF COMPLAINT: F/u of Metastatic Carcinoid cancer  SUMMARY OF ONCOLOGIC HISTORY: Oncology History Overview Note  Carcinoid tumor of colon, malignant   Staging form: Colon and Rectum, AJCC 7th Edition     Clinical: Stage Unknown ([place], N2b, M1) - Unsigned     Carcinoid tumor of colon, malignant ([facility])  02/16/2015 Imaging   CT abdomen and pelvis with contrast showed wall thickening of the cecum and terminal ileum high suspicious for cecal neoplasm. Extensive confluent ileal colic adenopathy extending chronically along the superior mesenteric vein, multiple hepatic metastasis   02/18/2015 Initial Diagnosis   Carcinoid tumor of colon, malignant, with liver metastasis   02/18/2015 Initial Biopsy   Liver biopsy showed metastatic low-grade neuroendocrine tumor (carcinoid). The tumor is positive for CD X2, CD 56, chromogranin, and synaptophysin.   03/05/2015 - 07/24/2018 Chemotherapy   Sandostatin 30 mg IM every 4 weeks. Due to disease progression regular monthly injections stopped after 07/24/18   03/06/2015 Imaging   CT chest showed no evidence of metastasis in the chest. 4.4 cm ascending aortic aneurysm.   09/09/2015 Imaging   Restaging CT abdomen and pelvis with contrast showed interval response of hepatic metastasis. Most are small and, some are stable. Slight interval decrease in mesenteric adenopathy and primary colon tumor.    12/06/2016 Imaging   CT CAP w contrast IMPRESSION: 1. Overall, the extent of metastatic disease to the liver and abdominal/retroperitoneal lymphatics is similar to the prior examination 08/19/2016. 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 2. Multiple small pulmonary nodules  appear unchanged in size, number and distribution, favored to be benign. 3. Aortic atherosclerosis, in addition to 2 vessel coronary artery disease. Assessment for potential risk factor modification, dietary therapy or pharmacologic therapy may be warranted, if clinically indicated. 4. In addition, there is dilatation of the aortic root (4.7 cm in diameter at the level of the sinuses of Valsalva) and ectasia of the ascending thoracic aorta measuring 4.4 cm in diameter. Recommend annual imaging followup by CTA or MRA. This recommendation follows 2010 ACCF/AHA/AATS/ACR/ASA/SCA/SCAI/SIR/STS/SVM Guidelines for the Diagnosis and Management of Patients with Thoracic Aortic Disease. Circulation. 2010; 121: P233-A076. 5. Trace volume of ascites. 6. Additional incidental findings, as above.    05/31/2017 Imaging   CT CAP W Contrast 05/31/17 IMPRESSION: 1. Overall the extent of metastatic disease to the liver and abdominal adenopathy is similar to previous exam from 12/06/2016. 2. 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 3. Stable small pulmonary nodules. 4.  Aortic Atherosclerosis (ICD10-I70.0). 5. Dilatation of the ascending thoracic aorta. Unchanged. Recommend annual imaging followup by CTA or MRA. This recommendation follows 2010 ACCF/AHA/AATS/ACR/ASA/SCA/SCAI/SIR/STS/SVM Guidelines for the Diagnosis and Management of Patients with Thoracic Aortic Disease. Circulation. 2010; 121: e266-e369 6. Trace ascites.   12/07/2017 Imaging   CT CAP W Contrast 12/07/17 IMPRESSION: Chest Impression: 1. No evidence of thoracic metastasis. 2. Stable ascending thoracic aortic aneurysm at 4.4 cm. Recommend attention on oncology surveillance. Abdomen / Pelvis Impression: 1. Mild interval increase in size of multifocal hepatic metastasis. 2. Large central small bowel mesenteric mass is unchanged 3. No bowel obstruction. 4. No skeletal  lesions    06/12/2018 Imaging   06/12/2018  CT AP IMPRESSION: Large mesenteric mass in the right mid abdomen, increased.  Multifocal hepatic metastases, increased.  Mild upper abdominal lymphadenopathy, mildly increased.  Masslike thickening involving the terminal ileum, suspicious for metastasis, similar to the prior. Additional nonspecific wall thickening involving multiple loops of small bowel in the pelvis.  Small volume pelvic ascites, mildly increased.   06/12/2018 Progression   IMPRESSION: Large mesenteric mass in the right mid abdomen, increased.  Multifocal hepatic metastases, increased.  Mild upper abdominal lymphadenopathy, mildly increased.  Masslike thickening involving the terminal ileum, suspicious for metastasis, similar to the prior. Additional nonspecific wall thickening involving multiple loops of small bowel in the pelvis.  Small volume pelvic ascites, mildly increased.   07/18/2018 PET scan   IMPRESSION: 1. Examination is positive for extensive neuroendocrine tumor receptor avid metastatic disease involving the liver, axial and appendicular skeleton, thoracic and abdominal lymph nodes. There is a dominant mass within the mesentery which is intensely radiotracer avid with a maximum dimension of 11.9 cm and an SUV max of 44.32. Within the distal small bowel loops there is a radiotracer avid lesion likely within the distal ileum. 2. When compared with CT from 06/13/2018 the tumor volume within the abdomen is not significantly changed in the interval. 3.  Aortic Atherosclerosis (ICD10-I70.0). 4. Coronary artery atherosclerotic calcifications 5. Nonobstructing left renal calculi   08/21/2018 - 02/05/2019 Chemotherapy   Lutathera treatment with Sandostatin injection 08/21/18, 10/16/18, 12/11/18, 02/05/19   03/12/2019 PET scan   IMPRESSION: 1. Marked interval positive response to peptide receptor radiotherapy. 2. Significant decrease in size and radiotracer  activity within liver metastasis. 3. Significant decrease in size and radiotracer activity of large peritoneal mass in RIGHT lower quadrant. 4. Decrease in size and radiotracer activity of retroperitoneal adenopathy in the upper abdomen. 5. Decrease in size and radiotracer activity or resolution of activity of the small skeletal metastasis.     05/22/2019 - 07/19/2019 Chemotherapy   Monthly Sandostatin injections restarted on 05/22/19-07/19/19. Did not improve symptoms of diarrhea, fatigue and did not improve which is likely related to his persistent high tumor burden.    06/17/2019 Imaging   CT AP W Contast  IMPRESSION: Multifocal hepatic metastases, unchanged from recent PET.   Mesenteric nodal metastasis in the right mid abdomen, unchanged from recent PET.   Stable wall thickening involving the distal ileum.   No focal osseous metastases are evident on CT.   Additional ancillary findings as above.     09/17/2019 -  Chemotherapy   Third line therapy oral Afinitor 7.5mg  daily starting 09/17/19. Increased to full dose 10mg  starting 12/11/19   12/12/2019 Imaging   CT AP W Contrast IMPRESSION: 1. Liver metastases are all mildly decreased. 2. Conglomerate nodal mass in the right mesentery is mildly decreased. 3. No new or progressive metastatic disease in the abdomen or pelvis. 4. Chronic wall thickening in the distal and terminal ileum and cecum surrounding the ileocecal valve, unchanged. No evidence of bowel obstruction. 5. Aortic Atherosclerosis (ICD10-I70.0).      CURRENT THERAPY:  Third line therapyoralAfinitor7.5mg  dailystarting 09/17/19. Increased to full dose 10mg starting 12/11/19 Xgeva monthly starting 10/17/19. Due to hypocalcemia will reduce to q34months starting 12/13/19.  INTERVAL HISTORY:  Ryan Cowan is here for a follow up and treatment. He presents to the clinic with his sister.   He developed left leg edema and skin redness in mid July, Left  doppler was negative for DVT. This has improved some but not resolved  He has been off Afinitor  since 04/22/2020 due to left leg edema and skin redness.  Saw his PCP and started synthroid in July Less diarrhea since he came off Afinitor   He denies significant pain, no nausea, bloating.  Energy level is moderate.  ROS otherwise negative    MEDICAL HISTORY:  Past Medical History:  Diagnosis Date  . Colon cancer ([place]) dx'd 02/2015  . Liver metastases ([place]) dx'd 02/2015  . Metastatic cancer to bone [place]) dx'd 2019    SURGICAL HISTORY: Past Surgical History:  Procedure Laterality Date  . APPENDECTOMY    . KNEE SURGERY    . SHOULDER SURGERY      I have reviewed the social history and family history with the patient and they are unchanged from previous note.  ALLERGIES:  is allergic to codeine.  MEDICATIONS:  Current Outpatient Medications  Medication Sig Dispense Refill  . calcium-vitamin D (OSCAL WITH D) 500-200 MG-UNIT tablet Take 2 tablets by mouth daily with breakfast.    . cyanocobalamin 2000 MCG tablet Take 1,000 mcg by mouth daily.    Marland Kitchen everolimus (AFINITOR) 10 MG tablet Take 1 tablet (10 mg total) by mouth daily. 30 tablet 5  . Multiple Vitamin (MULTIVITAMIN WITH MINERALS) TABS tablet Take 1 tablet by mouth daily. Centrum Silver 50 plus    . naproxen sodium (ANAPROX) 220 MG tablet Take 220 mg by mouth 2 (two) times daily as needed (pain).    Marland Kitchen OVER THE COUNTER MEDICATION Place 1 application into both nostrils daily as needed (congestion). Vicks inhaler stick    . sertraline (ZOLOFT) 100 MG tablet Take 100 mg by mouth daily.      No current facility-administered medications for this visit.    PHYSICAL EXAMINATION: ECOG PERFORMANCE STATUS: 2 - Symptomatic, <50% confined to bed  Vitals:   06/11/20 1252  BP: 138/78  Pulse: 66  Resp: 17  Temp: 97.8 F (36.6 C)  SpO2: 98%   Filed Weights   06/11/20 1252  Weight: 150 lb 9.6 oz (68.3 kg)    GENERAL:alert, no  distress and comfortable SKIN: skin color, texture, turgor are normal, no rashes or significant lesions EYES: normal, Conjunctiva are pink and non-injected, sclera clear NECK: supple, thyroid normal size, non-tender, without nodularity LYMPH:  no palpable lymphadenopathy in the cervical, axillary  LUNGS: clear to auscultation and percussion with normal breathing effort HEART: regular rate & rhythm and no murmurs and no lower extremity edema ABDOMEN:abdomen soft, non-tender and normal bowel sounds Musculoskeletal:no cyanosis of digits and no clubbing. Mild pitting edema of LLE, with diffuse skin erythema below calf NEURO: alert & oriented x 3 with fluent speech, no focal motor/sensory deficits  LABORATORY DATA:  I have reviewed the data as listed CBC Latest Ref Rng & Units 06/09/2020 04/15/2020 02/14/2020  WBC 4.0 - 10.5 K/uL 5.1 3.2(L) 2.9(L)  Hemoglobin 13.0 - 17.0 g/dL 11.3(L) 10.5(L) 10.7(L)  Hematocrit 39 - 52 % 33.6(L) 31.1(L) 31.7(L)  Platelets 150 - 400 K/uL 168 114(L) 102(L)     CMP Latest Ref Rng & Units 06/09/2020 04/15/2020 02/14/2020  Glucose 70 - 99 mg/dL 95 94 86  BUN 8 - 23 mg/dL 18 13 15   Creatinine 0.61 - 1.24 mg/dL 0.98 0.82 0.81  Sodium 135 - 145 mmol/L 140 142 140  Potassium 3.5 - 5.1 mmol/L 4.1 3.8 3.9  Chloride 98 - 111 mmol/L 105 107 105  CO2 22 - 32 mmol/L 30 27 27   Calcium 8.9 - 10.3 mg/dL 9.4 9.1 9.0  Total Protein 6.5 -  8.1 g/dL 6.3(L) 6.3(L) 6.2(L)  Total Bilirubin 0.3 - 1.2 mg/dL 0.3 0.3 0.3  Alkaline Phos 38 - 126 U/L 80 89 88  AST 15 - 41 U/L 11(L) 21 24  ALT 0 - 44 U/L 10 18 23       RADIOGRAPHIC STUDIES: I have personally reviewed the radiological images as listed and agreed with the findings in the report. CT Chest W Contrast  Result Date: 06/09/2020 CLINICAL DATA:  Metastatic neuroendocrine colon cancer with hepatic and osseous metastases, restaging EXAM: CT CHEST, ABDOMEN, AND PELVIS WITH CONTRAST TECHNIQUE: Multidetector CT imaging of the chest,  abdomen and pelvis was performed following the standard protocol during bolus administration of intravenous contrast. CONTRAST:  175mL OMNIPAQUE IOHEXOL 300 MG/ML SOLN, additional oral enteric contrast COMPARISON:  CT abdomen pelvis, 12/12/2019, PET-CT, 03/12/2019 FINDINGS: CT CHEST FINDINGS Cardiovascular: Unchanged enlargement of the tubular ascending thoracic aorta, measuring 4.3 x 4.2 cm. Scattered aortic atherosclerosis. Normal heart size. No pericardial effusion. Mediastinum/Nodes: No enlarged mediastinal, hilar, or axillary lymph nodes. Thyroid gland, trachea, and esophagus demonstrate no significant findings. Lungs/Pleura: Lungs are clear. No pleural effusion or pneumothorax. Musculoskeletal: No chest wall mass or suspicious bone lesions identified. CT ABDOMEN PELVIS FINDINGS Hepatobiliary: Multiple hypoenhancing liver lesions are not significant changed compared to prior examination, largest in the posterior liver dome, hepatic segment VII, measuring 2.6 x 2.0 cm (series 2, image 53). No gallstones, gallbladder wall thickening, or biliary dilatation. Pancreas: Unremarkable. No pancreatic ductal dilatation or surrounding inflammatory changes. Spleen: Normal in size without significant abnormality. Adrenals/Urinary Tract: Adrenal glands are unremarkable. Kidneys are normal, without renal calculi, solid lesion, or hydronephrosis. Bladder is unremarkable. Stomach/Bowel: Stomach is within normal limits. Appendix appears normal. Unchanged wall thickening and tethering of the cecal base and terminal ileum (series 7, image 49). Unchanged soft tissue mass in the right hemiabdominal mesentery measuring 4.5 x 2.6 cm (series 2, image 88). Vascular/Lymphatic: Aortic atherosclerosis. No discretely visible enlarged abdominal or pelvic lymph nodes. Reproductive: No mass or other abnormality. Other: No abdominal wall hernia or abnormality. Trace fluid in the low right hemipelvis, similar to prior examination and possibly  loculated (series 2, image 112). Musculoskeletal: No acute or significant osseous findings. IMPRESSION: 1. Multiple hypoenhancing metastatic liver lesions are not significant changed compared to prior. 2. Unchanged soft tissue mass in the right hemiabdominal mesentery. 3. Unchanged wall thickening and tethering of the cecal base and terminal ileum. 4. No evidence of new metastatic disease in the chest, abdomen, or pelvis. 5. Unchanged enlargement of the tubular ascending thoracic aorta, measuring 4.3 x 4.2 cm. Recommend annual imaging followup by CTA or MRA. This recommendation follows 2010 ACCF/AHA/AATS/ACR/ASA/SCA/SCAI/SIR/STS/SVM Guidelines for the Diagnosis and Management of Patients with Thoracic Aortic Disease. Circulation. 2010; 121: Z610-R604. Aortic aneurysm NOS (ICD10-I71.9) Aortic Atherosclerosis (ICD10-I70.0). Electronically Signed   By: Eddie Candle M.D.   On: 06/09/2020 16:16   CT Abdomen Pelvis W Contrast  Result Date: 06/09/2020 CLINICAL DATA:  Metastatic neuroendocrine colon cancer with hepatic and osseous metastases, restaging EXAM: CT CHEST, ABDOMEN, AND PELVIS WITH CONTRAST TECHNIQUE: Multidetector CT imaging of the chest, abdomen and pelvis was performed following the standard protocol during bolus administration of intravenous contrast. CONTRAST:  164mL OMNIPAQUE IOHEXOL 300 MG/ML SOLN, additional oral enteric contrast COMPARISON:  CT abdomen pelvis, 12/12/2019, PET-CT, 03/12/2019 FINDINGS: CT CHEST FINDINGS Cardiovascular: Unchanged enlargement of the tubular ascending thoracic aorta, measuring 4.3 x 4.2 cm. Scattered aortic atherosclerosis. Normal heart size. No pericardial effusion. Mediastinum/Nodes: No enlarged mediastinal, hilar, or axillary lymph nodes.  Thyroid gland, trachea, and esophagus demonstrate no significant findings. Lungs/Pleura: Lungs are clear. No pleural effusion or pneumothorax. Musculoskeletal: No chest wall mass or suspicious bone lesions identified. CT ABDOMEN PELVIS  FINDINGS Hepatobiliary: Multiple hypoenhancing liver lesions are not significant changed compared to prior examination, largest in the posterior liver dome, hepatic segment VII, measuring 2.6 x 2.0 cm (series 2, image 53). No gallstones, gallbladder wall thickening, or biliary dilatation. Pancreas: Unremarkable. No pancreatic ductal dilatation or surrounding inflammatory changes. Spleen: Normal in size without significant abnormality. Adrenals/Urinary Tract: Adrenal glands are unremarkable. Kidneys are normal, without renal calculi, solid lesion, or hydronephrosis. Bladder is unremarkable. Stomach/Bowel: Stomach is within normal limits. Appendix appears normal. Unchanged wall thickening and tethering of the cecal base and terminal ileum (series 7, image 49). Unchanged soft tissue mass in the right hemiabdominal mesentery measuring 4.5 x 2.6 cm (series 2, image 88). Vascular/Lymphatic: Aortic atherosclerosis. No discretely visible enlarged abdominal or pelvic lymph nodes. Reproductive: No mass or other abnormality. Other: No abdominal wall hernia or abnormality. Trace fluid in the low right hemipelvis, similar to prior examination and possibly loculated (series 2, image 112). Musculoskeletal: No acute or significant osseous findings. IMPRESSION: 1. Multiple hypoenhancing metastatic liver lesions are not significant changed compared to prior. 2. Unchanged soft tissue mass in the right hemiabdominal mesentery. 3. Unchanged wall thickening and tethering of the cecal base and terminal ileum. 4. No evidence of new metastatic disease in the chest, abdomen, or pelvis. 5. Unchanged enlargement of the tubular ascending thoracic aorta, measuring 4.3 x 4.2 cm. Recommend annual imaging followup by CTA or MRA. This recommendation follows 2010 ACCF/AHA/AATS/ACR/ASA/SCA/SCAI/SIR/STS/SVM Guidelines for the Diagnosis and Management of Patients with Thoracic Aortic Disease. Circulation. 2010; 121: Z169-C789. Aortic aneurysm NOS  (ICD10-I71.9) Aortic Atherosclerosis (ICD10-I70.0). Electronically Signed   By: Eddie Candle M.D.   On: 06/09/2020 16:16     ASSESSMENT & PLAN:  AREG BIALAS is a 74 y.o. male with    1. 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 Hewas first treated with Sandostatin injections since 03/2015. He had disease progression in 9/2019with bone mets in spine, pelvic bone and sternum. -Hehas completed 5 treatments of Lutatherawith Dr. Leonia Reeves on 08/21/18-02/05/19. -DOTATATE PET scan from 03/12/19 shows very good response to Geraldine treatment butunlikely cured from this alone .  -We restarted monthly Sandostatin injections on 8/19/20through10/16/20 but symptoms ofdiarrhea, fatigue anddid not improvewhich islikely related to his persistent high tumor burden. -Istarted him onthird line therapyoralAfinitor7.5mg  dailybeginning 09/17/19.Tolerating well.Increased to full dose 10mg starting 12/11/19.  -He developed LE edema and skin erythema in July 2021.  Afinitor has been held since then.  His leg symptoms improved some.  He plans to follow-up with his PCP, will continue hold Afinitor for now. -Proceed with Xgeva today and continue q61months. -f/u in 2 months   2. B/l LE Edema, L>R -Doppler was negative for lower extremity DVT -Edema started improved since he came off Afinitor -Plans to follow-up with his PCP   3. Bone mets, Mild Hypocalcemia -first noticed on PET scan in 07/2018 -To reduce his risk of fractureIstarted him onmonthly Xgeva on1/14/21, tolerated well.He has full dentures in place, I encouraged him to watch for bone exposure.  -Calcium normalized on increased oral calcium TID, will continue along with Vit D.   4. 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 -Stable lately.  5. Anxiety/Depressionand fatigue -Has been anxious lately. He had not wanted to stay home alone. His sisters would stay with him even overnight and required ED visit.  -On Zoloft100mg  currently, Managed by Dr. Arelia Sneddon -stable and improved overall     Plan -Lab reviewed, will proceed Delton See today and continue every 2 months -We will continue hold Afinitor for now -Lab, follow-up and injection in 2 months  No problem-specific Assessment & Plan notes found for this encounter.   No orders of the defined types were placed in this encounter.  All questions were answered. The patient knows to call the clinic with any problems, questions or concerns. No barriers to learning was detected. The total time spent in the appointment was 30 minutes.     [name], MD 06/11/2020   I, Joslyn Devon, am acting as scribe for [name], MD.   I have reviewed the above documentation for accuracy and completeness, and I agree with the above.

## 2020-06-11 ENCOUNTER — Other Ambulatory Visit: Payer: Self-pay

## 2020-06-11 ENCOUNTER — Encounter: Payer: Self-pay | Admitting: Hematology

## 2020-06-11 ENCOUNTER — Inpatient Hospital Stay: Payer: Medicare Other

## 2020-06-11 ENCOUNTER — Inpatient Hospital Stay: Payer: Medicare Other | Admitting: Hematology

## 2020-06-11 ENCOUNTER — Telehealth: Payer: Self-pay | Admitting: Hematology

## 2020-06-11 VITALS — BP 138/78 | HR 66 | Temp 97.8°F | Resp 17 | Ht 73.0 in | Wt 150.6 lb

## 2020-06-11 DIAGNOSIS — Z5111 Encounter for antineoplastic chemotherapy: Secondary | ICD-10-CM | POA: Diagnosis not present

## 2020-06-11 DIAGNOSIS — C7A029 Malignant carcinoid tumor of the large intestine, unspecified portion: Secondary | ICD-10-CM | POA: Diagnosis not present

## 2020-06-11 DIAGNOSIS — D500 Iron deficiency anemia secondary to blood loss (chronic): Secondary | ICD-10-CM

## 2020-06-11 MED ORDER — DENOSUMAB 120 MG/1.7ML ~~LOC~~ SOLN
SUBCUTANEOUS | Status: AC
  Filled 2020-06-11: qty 1.7

## 2020-06-11 MED ORDER — DENOSUMAB 120 MG/1.7ML ~~LOC~~ SOLN
120.0000 mg | Freq: Once | SUBCUTANEOUS | Status: AC
  Administered 2020-06-11: 120 mg via SUBCUTANEOUS

## 2020-06-11 NOTE — Telephone Encounter (Signed)
Scheduled appointment per 9/9 los. Patient is aware of appointment. Gave patient updated calendar.

## 2020-08-07 NOTE — Progress Notes (Signed)
[place]   [phone] [phone]   Clinic Follow up Note   Patient Care Team: Leonard Downing, MD as PCP - General (Family Medicine)  Date of Service:  08/10/2020  CHIEF COMPLAINT: F/u of Metastatic Carcinoid cancer  SUMMARY OF ONCOLOGIC HISTORY: Oncology History Overview Note  Carcinoid tumor of colon, malignant   Staging form: Colon and Rectum, AJCC 7th Edition     Clinical: Stage Unknown ([place], N2b, M1) - Unsigned     Carcinoid tumor of colon, malignant ([place])  02/16/2015 Imaging   CT abdomen and pelvis with contrast showed wall thickening of the cecum and terminal ileum high suspicious for cecal neoplasm. Extensive confluent ileal colic adenopathy extending chronically along the superior mesenteric vein, multiple hepatic metastasis   02/18/2015 Initial Diagnosis   Carcinoid tumor of colon, malignant, with liver metastasis   02/18/2015 Initial Biopsy   Liver biopsy showed metastatic low-grade neuroendocrine tumor (carcinoid). The tumor is positive for CD X2, CD 56, chromogranin, and synaptophysin.   03/05/2015 - 07/24/2018 Chemotherapy   Sandostatin 30 mg IM every 4 weeks. Due to disease progression regular monthly injections stopped after 07/24/18   03/06/2015 Imaging   CT chest showed no evidence of metastasis in the chest. 4.4 cm ascending aortic aneurysm.   09/09/2015 Imaging   Restaging CT abdomen and pelvis with contrast showed interval response of hepatic metastasis. Most are small and, some are stable. Slight interval decrease in mesenteric adenopathy and primary colon tumor.    12/06/2016 Imaging   CT CAP w contrast IMPRESSION: 1. Overall, the extent of metastatic disease to the liver and abdominal/retroperitoneal lymphatics is similar to the prior examination 08/19/2016. 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 2. Multiple small pulmonary  nodules appear unchanged in size, number and distribution, favored to be benign. 3. Aortic atherosclerosis, in addition to 2 vessel coronary artery disease. Assessment for potential risk factor modification, dietary therapy or pharmacologic therapy may be warranted, if clinically indicated. 4. In addition, there is dilatation of the aortic root (4.7 cm in diameter at the level of the sinuses of Valsalva) and ectasia of the ascending thoracic aorta measuring 4.4 cm in diameter. Recommend annual imaging followup by CTA or MRA. This recommendation follows 2010 ACCF/AHA/AATS/ACR/ASA/SCA/SCAI/SIR/STS/SVM Guidelines for the Diagnosis and Management of Patients with Thoracic Aortic Disease. Circulation. 2010; 121: J628-Z662. 5. Trace volume of ascites. 6. Additional incidental findings, as above.    05/31/2017 Imaging   CT CAP W Contrast 05/31/17 IMPRESSION: 1. Overall the extent of metastatic disease to the liver and abdominal adenopathy is similar to previous exam from 12/06/2016. 2. 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 3. Stable small pulmonary nodules. 4.  Aortic Atherosclerosis (ICD10-I70.0). 5. Dilatation of the ascending thoracic aorta. Unchanged. Recommend annual imaging followup by CTA or MRA. This recommendation follows 2010 ACCF/AHA/AATS/ACR/ASA/SCA/SCAI/SIR/STS/SVM Guidelines for the Diagnosis and Management of Patients with Thoracic Aortic Disease. Circulation. 2010; 121: e266-e369 6. Trace ascites.   12/07/2017 Imaging   CT CAP W Contrast 12/07/17 IMPRESSION: Chest Impression: 1. No evidence of thoracic metastasis. 2. Stable ascending thoracic aortic aneurysm at 4.4 cm. Recommend attention on oncology surveillance. Abdomen / Pelvis Impression: 1. Mild interval increase in size of multifocal hepatic metastasis. 2. Large central small bowel mesenteric mass is unchanged 3. No bowel obstruction. 4. No  skeletal lesions    06/12/2018 Imaging   06/12/2018  CT AP IMPRESSION: Large mesenteric mass in the right mid abdomen, increased.  Multifocal hepatic metastases, increased.  Mild upper abdominal lymphadenopathy, mildly increased.  Masslike thickening involving the terminal ileum, suspicious for metastasis, similar to the prior. Additional nonspecific wall thickening involving multiple loops of small bowel in the pelvis.  Small volume pelvic ascites, mildly increased.   06/12/2018 Progression   IMPRESSION: Large mesenteric mass in the right mid abdomen, increased.  Multifocal hepatic metastases, increased.  Mild upper abdominal lymphadenopathy, mildly increased.  Masslike thickening involving the terminal ileum, suspicious for metastasis, similar to the prior. Additional nonspecific wall thickening involving multiple loops of small bowel in the pelvis.  Small volume pelvic ascites, mildly increased.   07/18/2018 PET scan   IMPRESSION: 1. Examination is positive for extensive neuroendocrine tumor receptor avid metastatic disease involving the liver, axial and appendicular skeleton, thoracic and abdominal lymph nodes. There is a dominant mass within the mesentery which is intensely radiotracer avid with a maximum dimension of 11.9 cm and an SUV max of 44.32. Within the distal small bowel loops there is a radiotracer avid lesion likely within the distal ileum. 2. When compared with CT from 06/13/2018 the tumor volume within the abdomen is not significantly changed in the interval. 3.  Aortic Atherosclerosis (ICD10-I70.0). 4. Coronary artery atherosclerotic calcifications 5. Nonobstructing left renal calculi   08/21/2018 - 02/05/2019 Chemotherapy   Lutathera treatment with Sandostatin injection 08/21/18, 10/16/18, 12/11/18, 02/05/19   03/12/2019 PET scan   IMPRESSION: 1. Marked interval positive response to peptide receptor radiotherapy. 2. Significant decrease in size and  radiotracer activity within liver metastasis. 3. Significant decrease in size and radiotracer activity of large peritoneal mass in RIGHT lower quadrant. 4. Decrease in size and radiotracer activity of retroperitoneal adenopathy in the upper abdomen. 5. Decrease in size and radiotracer activity or resolution of activity of the small skeletal metastasis.     05/22/2019 - 07/19/2019 Chemotherapy   Monthly Sandostatin injections restarted on 05/22/19-07/19/19. Did not improve symptoms of diarrhea, fatigue and did not improve which is likely related to his persistent high tumor burden.    06/17/2019 Imaging   CT AP W Contast  IMPRESSION: Multifocal hepatic metastases, unchanged from recent PET.   Mesenteric nodal metastasis in the right mid abdomen, unchanged from recent PET.   Stable wall thickening involving the distal ileum.   No focal osseous metastases are evident on CT.   Additional ancillary findings as above.     09/17/2019 - 04/2020 Chemotherapy   Third line therapy oral Afinitor 7.5mg  daily starting 09/17/19. Increased to full dose 10mg  starting 12/11/19. Afinitor held since 04/2020 due to LE edema and skin erythema. Now on observation   12/12/2019 Imaging   CT AP W Contrast IMPRESSION: 1. Liver metastases are all mildly decreased. 2. Conglomerate nodal mass in the right mesentery is mildly decreased. 3. No new or progressive metastatic disease in the abdomen or pelvis. 4. Chronic wall thickening in the distal and terminal ileum and cecum surrounding the ileocecal valve, unchanged. No evidence of bowel obstruction. 5. Aortic Atherosclerosis (ICD10-I70.0).      CURRENT THERAPY:  Observation   Xgeva monthly starting 10/17/19. Due to hypocalcemia will reduce to q38months starting 12/13/19.  INTERVAL HISTORY:  Ryan Cowan is here for a follow up and treatment. He presents to the clinic with his sister. He notes his LE swelling has improved but still has skin erythema at  ankle. He notes he will feel weaker some days.  He notes change  in diarrhea when he started to eat more greens. He notes he is drinking adequately. He notes occasional hot flashes.    REVIEW OF SYSTEMS:   Constitutional: Denies fevers, chills or abnormal weight loss (+) Occasional hot flashes  Eyes: Denies blurriness of vision Ears, nose, mouth, throat, and face: Denies mucositis or sore throat Respiratory: Denies cough, dyspnea or wheezes Cardiovascular: Denies palpitation, chest discomfort (+) lower extremity swelling, much improved.  Gastrointestinal:  Denies nausea, heartburn or change in bowel habits Skin: Denies abnormal skin rashes (+ Skin erythema around ankles Lymphatics: Denies new lymphadenopathy or easy bruising Neurological:Denies numbness, tingling or new weaknesses Behavioral/Psych: Mood is stable, no new changes  All other systems were reviewed with the patient and are negative.  MEDICAL HISTORY:  Past Medical History:  Diagnosis Date   Colon cancer ([place]) dx'd 02/2015   Liver metastases ([place]) dx'd 02/2015   Metastatic cancer to bone [facility]) dx'd 2019    SURGICAL HISTORY: Past Surgical History:  Procedure Laterality Date   APPENDECTOMY     KNEE SURGERY     SHOULDER SURGERY      I have reviewed the social history and family history with the patient and they are unchanged from previous note.  ALLERGIES:  is allergic to codeine.  MEDICATIONS:  Current Outpatient Medications  Medication Sig Dispense Refill   calcium-vitamin D (OSCAL WITH D) 500-200 MG-UNIT tablet Take 2 tablets by mouth daily with breakfast.     cyanocobalamin 2000 MCG tablet Take 1,000 mcg by mouth daily.     levothyroxine (SYNTHROID) 100 MCG tablet Take 100 mcg by mouth daily.     Multiple Vitamin (MULTIVITAMIN WITH MINERALS) TABS tablet Take 1 tablet by mouth daily. Centrum Silver 50 plus     naproxen sodium (ANAPROX) 220 MG tablet Take 220 mg by mouth 2 (two) times daily as needed  (pain).     OVER THE COUNTER MEDICATION Place 1 application into both nostrils daily as needed (congestion). Vicks inhaler stick     sertraline (ZOLOFT) 100 MG tablet Take 100 mg by mouth daily.      No current facility-administered medications for this visit.    PHYSICAL EXAMINATION: ECOG PERFORMANCE STATUS: 1 - Symptomatic but completely ambulatory  Vitals:   08/10/20 1410  BP: 134/74  Pulse: (!) 54  Resp: 18  Temp: 98.6 F (37 C)  SpO2: 98%   Filed Weights   08/10/20 1410  Weight: 148 lb 14.4 oz (67.5 kg)    GENERAL:alert, no distress and comfortable SKIN: skin color, texture, turgor are normal, no rashes or significant lesions (+) Skin erythema of ankles EYES: normal, Conjunctiva are pink and non-injected, sclera clear  NECK: supple, thyroid normal size, non-tender, without nodularity LYMPH:  no palpable lymphadenopathy in the cervical, axillary  LUNGS: clear to auscultation and percussion with normal breathing effort HEART: regular rate & rhythm and no murmurs (+) minimal lower extremity edema ABDOMEN:abdomen soft, non-tender and normal bowel sounds  Musculoskeletal:no cyanosis of digits and no clubbing  NEURO: alert & oriented x 3 with fluent speech, no focal motor/sensory deficits  LABORATORY DATA:  I have reviewed the data as listed CBC Latest Ref Rng & Units 08/10/2020 06/09/2020 04/15/2020  WBC 4.0 - 10.5 K/uL 4.7 5.1 3.2(L)  Hemoglobin 13.0 - 17.0 g/dL 11.3(L) 11.3(L) 10.5(L)  Hematocrit 39 - 52 % 32.6(L) 33.6(L) 31.1(L)  Platelets 150 - 400 K/uL 186 168 114(L)     CMP Latest Ref Rng & Units 08/10/2020 06/09/2020 04/15/2020  Glucose 70 -  99 mg/dL 99 95 94  BUN 8 - 23 mg/dL 20 18 13   Creatinine 0.61 - 1.24 mg/dL 0.83 0.98 0.82  Sodium 135 - 145 mmol/L 139 140 142  Potassium 3.5 - 5.1 mmol/L 4.1 4.1 3.8  Chloride 98 - 111 mmol/L 103 105 107  CO2 22 - 32 mmol/L 30 30 27   Calcium 8.9 - 10.3 mg/dL 9.0 9.4 9.1  Total Protein 6.5 - 8.1 g/dL 5.9(L) 6.3(L) 6.3(L)    Total Bilirubin 0.3 - 1.2 mg/dL 0.4 0.3 0.3  Alkaline Phos 38 - 126 U/L 70 80 89  AST 15 - 41 U/L 13(L) 11(L) 21  ALT 0 - 44 U/L 10 10 18       RADIOGRAPHIC STUDIES: I have personally reviewed the radiological images as listed and agreed with the findings in the report. No results found.   ASSESSMENT & PLAN:  Ryan Cowan is a 74 y.o. male with    1. 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 Hewas first treated with Sandostatin injections since 03/2015. He had disease progression in 9/2019with bone mets in spine, pelvic bone and sternum. -Hehas completed 5 treatments of Lutatherawith Dr. Leonia Reeves on 08/21/18-02/05/19. -DOTATATE PET scan from 03/12/19 shows very good response to R[place]el treatment butunlikely cured from this alone .  -We restarted monthly Sandostatin injections on 8/19/20through10/16/20 but symptoms ofdiarrhea, fatigue anddid not improvewhich islikely related to his persistent high tumor burden. -Istarted him onthird line therapyoralAfinitor7.5mg  dailybeginning 09/17/19.Tolerating well.Increased to full dose 10mg starting 12/11/19. Afinitor has been held since 04/2020 due to LE edema and skin erythema.  -He is clinically doing well. Labs reviewed, Hg 11.3, protein 5.9, albumin 3.2. Physical exam unremarkable. Although his skin erythema persist off Afinitor, will continue to hold Afinitor for now since he has no clinical concerns for tumor progression. If he continues to do well off Afinitor will continue to hold until disease progression.  -Proceed with Xgeva today and continue q60months. -Plan to scan him again in 4 months. F/u in 2 months   2. B/l LE Edema, L>R -Doppler was negative for lower extremity DVT -Edema much improved now, but still has skin erythema. This does not appear to be related to Afinitor.  -Plans to follow-up with his PCP   3. Bone mets, Mild  Hypocalcemia -first noticed on PET scan in 07/2018 -To reduce his risk of fractureIstarted him onmonthly Xgeva on1/14/21, tolerated well.He has full dentures in place, I encouraged him to watch for bone exposure.  -Calcium normalized on increased oral calciumTID, will continuealong with Vit D.  4. 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 -Mild and stable   5. Anxiety/Depressionand fatigue -Has been anxious lately. He had not wanted to stay home alone. His sisters would stay with him even overnight and required ED visit.  -On Zoloft100mg  currently, Managed by Dr. Arelia Sneddon -Stable and improved overall    Plan -Lab reviewed, will proceed Delton See today and continue every 2 months -We will continue hold Afinitor for now. Will continue with observation.  -Lab, follow-up and injection in 2 months, will order restaging CT scan on next visit    No problem-specific Assessment & Plan notes found for this encounter.   No orders of the defined types were placed in this encounter.  All questions were answered.  The patient knows to call the clinic with any problems, questions or concerns. No barriers to learning was detected. The total time spent in the appointment was 30 minutes.     [name], MD 08/10/2020   I, Joslyn Devon, am acting as scribe for [name], MD.   I have reviewed the above documentation for accuracy and completeness, and I agree with the above.

## 2020-08-10 ENCOUNTER — Inpatient Hospital Stay: Payer: Medicare Other | Admitting: Hematology

## 2020-08-10 ENCOUNTER — Encounter: Payer: Self-pay | Admitting: Hematology

## 2020-08-10 ENCOUNTER — Other Ambulatory Visit: Payer: Self-pay

## 2020-08-10 ENCOUNTER — Inpatient Hospital Stay: Payer: Medicare Other

## 2020-08-10 ENCOUNTER — Inpatient Hospital Stay: Payer: Medicare Other | Attending: Hematology

## 2020-08-10 ENCOUNTER — Telehealth: Payer: Self-pay | Admitting: Hematology

## 2020-08-10 VITALS — BP 134/74 | HR 54 | Temp 98.6°F | Resp 18 | Ht 73.0 in | Wt 148.9 lb

## 2020-08-10 DIAGNOSIS — C787 Secondary malignant neoplasm of liver and intrahepatic bile duct: Secondary | ICD-10-CM | POA: Diagnosis not present

## 2020-08-10 DIAGNOSIS — C7951 Secondary malignant neoplasm of bone: Secondary | ICD-10-CM | POA: Insufficient documentation

## 2020-08-10 DIAGNOSIS — C7A029 Malignant carcinoid tumor of the large intestine, unspecified portion: Secondary | ICD-10-CM

## 2020-08-10 DIAGNOSIS — R5383 Other fatigue: Secondary | ICD-10-CM | POA: Insufficient documentation

## 2020-08-10 DIAGNOSIS — D509 Iron deficiency anemia, unspecified: Secondary | ICD-10-CM | POA: Insufficient documentation

## 2020-08-10 DIAGNOSIS — R600 Localized edema: Secondary | ICD-10-CM | POA: Insufficient documentation

## 2020-08-10 DIAGNOSIS — C772 Secondary and unspecified malignant neoplasm of intra-abdominal lymph nodes: Secondary | ICD-10-CM | POA: Diagnosis not present

## 2020-08-10 DIAGNOSIS — D500 Iron deficiency anemia secondary to blood loss (chronic): Secondary | ICD-10-CM

## 2020-08-10 DIAGNOSIS — F419 Anxiety disorder, unspecified: Secondary | ICD-10-CM | POA: Insufficient documentation

## 2020-08-10 LAB — CBC WITH DIFFERENTIAL/PLATELET
Abs Immature Granulocytes: 0.01 10*3/uL (ref 0.00–0.07)
Basophils Absolute: 0 10*3/uL (ref 0.0–0.1)
Basophils Relative: 1 %
Eosinophils Absolute: 0.1 10*3/uL (ref 0.0–0.5)
Eosinophils Relative: 1 %
HCT: 32.6 % — ABNORMAL LOW (ref 39.0–52.0)
Hemoglobin: 11.3 g/dL — ABNORMAL LOW (ref 13.0–17.0)
Immature Granulocytes: 0 %
Lymphocytes Relative: 13 %
Lymphs Abs: 0.6 10*3/uL — ABNORMAL LOW (ref 0.7–4.0)
MCH: 34.6 pg — ABNORMAL HIGH (ref 26.0–34.0)
MCHC: 34.7 g/dL (ref 30.0–36.0)
MCV: 99.7 fL (ref 80.0–100.0)
Monocytes Absolute: 0.6 10*3/uL (ref 0.1–1.0)
Monocytes Relative: 12 %
Neutro Abs: 3.4 10*3/uL (ref 1.7–7.7)
Neutrophils Relative %: 73 %
Platelets: 186 10*3/uL (ref 150–400)
RBC: 3.27 MIL/uL — ABNORMAL LOW (ref 4.22–5.81)
RDW: 12.9 % (ref 11.5–15.5)
WBC: 4.7 10*3/uL (ref 4.0–10.5)
nRBC: 0 % (ref 0.0–0.2)

## 2020-08-10 LAB — COMPREHENSIVE METABOLIC PANEL
ALT: 10 U/L (ref 0–44)
AST: 13 U/L — ABNORMAL LOW (ref 15–41)
Albumin: 3.2 g/dL — ABNORMAL LOW (ref 3.5–5.0)
Alkaline Phosphatase: 70 U/L (ref 38–126)
Anion gap: 6 (ref 5–15)
BUN: 20 mg/dL (ref 8–23)
CO2: 30 mmol/L (ref 22–32)
Calcium: 9 mg/dL (ref 8.9–10.3)
Chloride: 103 mmol/L (ref 98–111)
Creatinine, Ser: 0.83 mg/dL (ref 0.61–1.24)
GFR, Estimated: >60 mL/min (ref >60)
Glucose, Bld: 99 mg/dL (ref 70–99)
Potassium: 4.1 mmol/L (ref 3.5–5.1)
Sodium: 139 mmol/L (ref 135–145)
Total Bilirubin: 0.4 mg/dL (ref 0.3–1.2)
Total Protein: 5.9 g/dL — ABNORMAL LOW (ref 6.5–8.1)

## 2020-08-10 LAB — VITAMIN D 25 HYDROXY (VIT D DEFICIENCY, FRACTURES): Vit D, 25-Hydroxy: 33.8 ng/mL (ref 30–100)

## 2020-08-10 LAB — FERRITIN: Ferritin: 42 ng/mL (ref 24–336)

## 2020-08-10 MED ORDER — DENOSUMAB 120 MG/1.7ML ~~LOC~~ SOLN
SUBCUTANEOUS | Status: AC
  Filled 2020-08-10: qty 1.7

## 2020-08-10 MED ORDER — DENOSUMAB 120 MG/1.7ML ~~LOC~~ SOLN
120.0000 mg | Freq: Once | SUBCUTANEOUS | Status: AC
  Administered 2020-08-10: 120 mg via SUBCUTANEOUS

## 2020-08-10 NOTE — Telephone Encounter (Signed)
Scheduled per los. Gave avs and calendar  

## 2020-08-10 NOTE — Patient Instructions (Signed)
Denosumab injection °What is this medicine? °DENOSUMAB (den oh sue mab) slows bone breakdown. Prolia is used to treat osteoporosis in women after menopause and in men, and in people who are taking corticosteroids for 6 months or more. Xgeva is used to treat a high calcium level due to cancer and to prevent bone fractures and other bone problems caused by multiple myeloma or cancer bone metastases. Xgeva is also used to treat giant cell tumor of the bone. °This medicine may be used for other purposes; ask your health care provider or pharmacist if you have questions. °COMMON BRAND NAME(S): Prolia, XGEVA °What should I tell my health care provider before I take this medicine? °They need to know if you have any of these conditions: °· dental disease °· having surgery or tooth extraction °· infection °· kidney disease °· low levels of calcium or Vitamin D in the blood °· malnutrition °· on hemodialysis °· skin conditions or sensitivity °· thyroid or parathyroid disease °· an unusual reaction to denosumab, other medicines, foods, dyes, or preservatives °· pregnant or trying to get pregnant °· breast-feeding °How should I use this medicine? °This medicine is for injection under the skin. It is given by a health care professional in a hospital or clinic setting. °A special MedGuide will be given to you before each treatment. Be sure to read this information carefully each time. °For Prolia, talk to your pediatrician regarding the use of this medicine in children. Special care may be needed. For Xgeva, talk to your pediatrician regarding the use of this medicine in children. While this drug may be prescribed for children as young as 13 years for selected conditions, precautions do apply. °Overdosage: If you think you have taken too much of this medicine contact a poison control center or emergency room at once. °NOTE: This medicine is only for you. Do not share this medicine with others. °What if I miss a dose? °It is  important not to miss your dose. Call your doctor or health care professional if you are unable to keep an appointment. °What may interact with this medicine? °Do not take this medicine with any of the following medications: °· other medicines containing denosumab °This medicine may also interact with the following medications: °· medicines that lower your chance of fighting infection °· steroid medicines like prednisone or cortisone °This list may not describe all possible interactions. Give your health care provider a list of all the medicines, herbs, non-prescription drugs, or dietary supplements you use. Also tell them if you smoke, drink alcohol, or use illegal drugs. Some items may interact with your medicine. °What should I watch for while using this medicine? °Visit your doctor or health care professional for regular checks on your progress. Your doctor or health care professional may order blood tests and other tests to see how you are doing. °Call your doctor or health care professional for advice if you get a fever, chills or sore throat, or other symptoms of a cold or flu. Do not treat yourself. This drug may decrease your body's ability to fight infection. Try to avoid being around people who are sick. °You should make sure you get enough calcium and vitamin D while you are taking this medicine, unless your doctor tells you not to. Discuss the foods you eat and the vitamins you take with your health care professional. °See your dentist regularly. Brush and floss your teeth as directed. Before you have any dental work done, tell your dentist you are   receiving this medicine. Do not become pregnant while taking this medicine or for 5 months after stopping it. Talk with your doctor or health care professional about your birth control options while taking this medicine. Women should inform their doctor if they wish to become pregnant or think they might be pregnant. There is a potential for serious side  effects to an unborn child. Talk to your health care professional or pharmacist for more information. What side effects may I notice from receiving this medicine? Side effects that you should report to your doctor or health care professional as soon as possible:  allergic reactions like skin rash, itching or hives, swelling of the face, lips, or tongue  bone pain  breathing problems  dizziness  jaw pain, especially after dental work  redness, blistering, peeling of the skin  signs and symptoms of infection like fever or chills; cough; sore throat; pain or trouble passing urine  signs of low calcium like fast heartbeat, muscle cramps or muscle pain; pain, tingling, numbness in the hands or feet; seizures  unusual bleeding or bruising  unusually weak or tired Side effects that usually do not require medical attention (report to your doctor or health care professional if they continue or are bothersome):  constipation  diarrhea  headache  joint pain  loss of appetite  muscle pain  runny nose  tiredness  upset stomach This list may not describe all possible side effects. Call your doctor for medical advice about side effects. You may report side effects to FDA at 1-800-FDA-1088. Where should I keep my medicine? This medicine is only given in a clinic, doctor's office, or other health care setting and will not be stored at home. NOTE: This sheet is a summary. It may not cover all possible information. If you have questions about this medicine, talk to your doctor, pharmacist, or health care provider.  2020 Elsevier/Gold Standard (2018-01-26 16:10:44)

## 2020-09-30 ENCOUNTER — Other Ambulatory Visit: Payer: Self-pay | Admitting: Nurse Practitioner

## 2020-09-30 DIAGNOSIS — F32A Depression, unspecified: Secondary | ICD-10-CM

## 2020-10-06 LAB — 5 HIAA, QUANTITATIVE, URINE, 24 HOUR
5-HIAA, Ur: 15.4 mg/L
5-HIAA,Quant.,24 Hr Urine: 34.7 mg/24 hr — ABNORMAL HIGH (ref 0.0–14.9)
Total Volume: 2250

## 2020-10-07 NOTE — Progress Notes (Signed)
[place]   [phone] [phone]   Clinic Follow up Note   Patient Care Team: Leonard Downing, MD as PCP - General (Family Medicine)  Date of Service:  10/09/2020  CHIEF COMPLAINT: F/u of Metastatic Carcinoid cancer  SUMMARY OF ONCOLOGIC HISTORY: Oncology History Overview Note  Carcinoid tumor of colon, malignant   Staging form: Colon and Rectum, AJCC 7th Edition     Clinical: Stage Unknown ([place], N2b, M1) - Unsigned     Carcinoid tumor of colon, malignant ([place])  02/16/2015 Imaging   CT abdomen and pelvis with contrast showed wall thickening of the cecum and terminal ileum high suspicious for cecal neoplasm. Extensive confluent ileal colic adenopathy extending chronically along the superior mesenteric vein, multiple hepatic metastasis   02/18/2015 Initial Diagnosis   Carcinoid tumor of colon, malignant, with liver metastasis   02/18/2015 Initial Biopsy   Liver biopsy showed metastatic low-grade neuroendocrine tumor (carcinoid). The tumor is positive for CD X2, CD 56, chromogranin, and synaptophysin.   03/05/2015 - 07/24/2018 Chemotherapy   Sandostatin 30 mg IM every 4 weeks. Due to disease progression regular monthly injections stopped after 07/24/18   03/06/2015 Imaging   CT chest showed no evidence of metastasis in the chest. 4.4 cm ascending aortic aneurysm.   09/09/2015 Imaging   Restaging CT abdomen and pelvis with contrast showed interval response of hepatic metastasis. Most are small and, some are stable. Slight interval decrease in mesenteric adenopathy and primary colon tumor.    12/06/2016 Imaging   CT CAP w contrast IMPRESSION: 1. Overall, the extent of metastatic disease to the liver and abdominal/retroperitoneal lymphatics is similar to the prior examination 08/19/2016. 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 2. Multiple small pulmonary nodules  appear unchanged in size, number and distribution, favored to be benign. 3. Aortic atherosclerosis, in addition to 2 vessel coronary artery disease. Assessment for potential risk factor modification, dietary therapy or pharmacologic therapy may be warranted, if clinically indicated. 4. In addition, there is dilatation of the aortic root (4.7 cm in diameter at the level of the sinuses of Valsalva) and ectasia of the ascending thoracic aorta measuring 4.4 cm in diameter. Recommend annual imaging followup by CTA or MRA. This recommendation follows 2010 ACCF/AHA/AATS/ACR/ASA/SCA/SCAI/SIR/STS/SVM Guidelines for the Diagnosis and Management of Patients with Thoracic Aortic Disease. Circulation. 2010; 121: [id number]. 5. Trace volume of ascites. 6. Additional incidental findings, as above.    05/31/2017 Imaging   CT CAP W Contrast 05/31/17 IMPRESSION: 1. Overall the extent of metastatic disease to the liver and abdominal adenopathy is similar to previous exam from 12/06/2016. 2. 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 3. Stable small pulmonary nodules. 4.  Aortic Atherosclerosis (ICD10-I70.0). 5. Dilatation of the ascending thoracic aorta. Unchanged. Recommend annual imaging followup by CTA or MRA. This recommendation follows 2010 ACCF/AHA/AATS/ACR/ASA/SCA/SCAI/SIR/STS/SVM Guidelines for the Diagnosis and Management of Patients with Thoracic Aortic Disease. Circulation. 2010; 121: [id number] 6. Trace ascites.   12/07/2017 Imaging   CT CAP W Contrast 12/07/17 IMPRESSION: Chest Impression: 1. No evidence of thoracic metastasis. 2. Stable ascending thoracic aortic aneurysm at 4.4 cm. Recommend attention on oncology surveillance. Abdomen / Pelvis Impression: 1. Mild interval increase in size of multifocal hepatic metastasis. 2. Large central small bowel mesenteric mass is unchanged 3. No bowel obstruction. 4. No skeletal  lesions    06/12/2018 Imaging   06/12/2018  CT AP IMPRESSION: Large mesenteric mass in the right mid abdomen, increased.  Multifocal hepatic metastases, increased.  Mild upper abdominal lymphadenopathy, mildly increased.  Masslike thickening involving the terminal ileum, suspicious for metastasis, similar to the prior. Additional nonspecific wall thickening involving multiple loops of small bowel in the pelvis.  Small volume pelvic ascites, mildly increased.   06/12/2018 Progression   IMPRESSION: Large mesenteric mass in the right mid abdomen, increased.  Multifocal hepatic metastases, increased.  Mild upper abdominal lymphadenopathy, mildly increased.  Masslike thickening involving the terminal ileum, suspicious for metastasis, similar to the prior. Additional nonspecific wall thickening involving multiple loops of small bowel in the pelvis.  Small volume pelvic ascites, mildly increased.   07/18/2018 PET scan   IMPRESSION: 1. Examination is positive for extensive neuroendocrine tumor receptor avid metastatic disease involving the liver, axial and appendicular skeleton, thoracic and abdominal lymph nodes. There is a dominant mass within the mesentery which is intensely radiotracer avid with a maximum dimension of 11.9 cm and an SUV max of 44.32. Within the distal small bowel loops there is a radiotracer avid lesion likely within the distal ileum. 2. When compared with CT from 06/13/2018 the tumor volume within the abdomen is not significantly changed in the interval. 3.  Aortic Atherosclerosis (ICD10-I70.0). 4. Coronary artery atherosclerotic calcifications 5. Nonobstructing left renal calculi   08/21/2018 - 02/05/2019 Chemotherapy   Lutathera treatment with Sandostatin injection 08/21/18, 10/16/18, 12/11/18, 02/05/19   03/12/2019 PET scan   IMPRESSION: 1. Marked interval positive response to peptide receptor radiotherapy. 2. Significant decrease in size and radiotracer  activity within liver metastasis. 3. Significant decrease in size and radiotracer activity of large peritoneal mass in RIGHT lower quadrant. 4. Decrease in size and radiotracer activity of retroperitoneal adenopathy in the upper abdomen. 5. Decrease in size and radiotracer activity or resolution of activity of the small skeletal metastasis.     05/22/2019 - 07/19/2019 Chemotherapy   Monthly Sandostatin injections restarted on 05/22/19-07/19/19. Did not improve symptoms of diarrhea, fatigue and did not improve which is likely related to his persistent high tumor burden.    06/17/2019 Imaging   CT AP W Contast  IMPRESSION: Multifocal hepatic metastases, unchanged from recent PET.   Mesenteric nodal metastasis in the right mid abdomen, unchanged from recent PET.   Stable wall thickening involving the distal ileum.   No focal osseous metastases are evident on CT.   Additional ancillary findings as above.     09/17/2019 - 04/2020 Chemotherapy   Third line therapy oral Afinitor 7.5mg  daily starting 09/17/19. Increased to full dose 10mg  starting 12/11/19. Afinitor held since 04/2020 due to LE edema and skin erythema. Now on observation   12/12/2019 Imaging   CT AP W Contrast IMPRESSION: 1. Liver metastases are all mildly decreased. 2. Conglomerate nodal mass in the right mesentery is mildly decreased. 3. No new or progressive metastatic disease in the abdomen or pelvis. 4. Chronic wall thickening in the distal and terminal ileum and cecum surrounding the ileocecal valve, unchanged. No evidence of bowel obstruction. 5. Aortic Atherosclerosis (ICD10-I70.0).      CURRENT THERAPY:  Observation   Xgeva monthly starting 10/17/19. Due to hypocalcemia will reduce to q63months starting 12/13/19.  INTERVAL HISTORY:  Deklen P Vargo is here for a follow up. He presents to the clinic with his sister.  He notes he is doing well. He has not required as many naps, so his energy has improved. He  is having less diarrhea lately well. He plans  to f/u with dentist about his bottom dentures not fitting. He will notify them of his Xgeva.    REVIEW OF SYSTEMS:   Constitutional: Denies fevers, chills or abnormal weight loss (+) improved fatigue  Eyes: Denies blurriness of vision Ears, nose, mouth, throat, and face: Denies mucositis or sore throat Respiratory: Denies cough, dyspnea or wheezes Cardiovascular: Denies palpitation, chest discomfort or lower extremity swelling Gastrointestinal:  Denies nausea, heartburn or change in bowel habits Skin: Denies abnormal skin rashes Lymphatics: Denies new lymphadenopathy or easy bruising Neurological:Denies numbness, tingling or new weaknesses Behavioral/Psych: Mood is stable, no new changes  All other systems were reviewed with the patient and are negative.  MEDICAL HISTORY:  Past Medical History:  Diagnosis Date  . Colon cancer ([facility]) dx'd 02/2015  . Liver metastases ([facility]) dx'd 02/2015  . Metastatic cancer to bone [facility]) dx'd 2019    SURGICAL HISTORY: Past Surgical History:  Procedure Laterality Date  . APPENDECTOMY    . KNEE SURGERY    . SHOULDER SURGERY      I have reviewed the social history and family history with the patient and they are unchanged from previous note.  ALLERGIES:  is allergic to codeine.  MEDICATIONS:  Current Outpatient Medications  Medication Sig Dispense Refill  . calcium-vitamin D (OSCAL WITH D) 500-200 MG-UNIT tablet Take 2 tablets by mouth daily with breakfast.    . cyanocobalamin 2000 MCG tablet Take 1,000 mcg by mouth daily.    Marland Kitchen levothyroxine (SYNTHROID) 100 MCG tablet Take 100 mcg by mouth daily.    . Multiple Vitamin (MULTIVITAMIN WITH MINERALS) TABS tablet Take 1 tablet by mouth daily. Centrum Silver 50 plus    . naproxen sodium (ANAPROX) 220 MG tablet Take 220 mg by mouth 2 (two) times daily as needed (pain).    Marland Kitchen OVER THE COUNTER MEDICATION Place 1 application into both nostrils daily as needed  (congestion). Vicks inhaler stick    . sertraline (ZOLOFT) 100 MG tablet Take 100 mg by mouth daily.      No current facility-administered medications for this visit.    PHYSICAL EXAMINATION: ECOG PERFORMANCE STATUS: 1 - Symptomatic but completely ambulatory  Vitals:   10/09/20 1351  BP: (!) 154/76  Pulse: (!) 53  Resp: 18  Temp: 97.8 F (36.6 C)  SpO2: 97%   Filed Weights   10/09/20 1351  Weight: 152 lb 3.2 oz (69 kg)    GENERAL:alert, no distress and comfortable SKIN: skin color, texture, turgor are normal, no rashes or significant lesions (+) Left medial ankle skin erythema.  EYES: normal, Conjunctiva are pink and non-injected, sclera clear  NECK: supple, thyroid normal size, non-tender, without nodularity LYMPH:  no palpable lymphadenopathy in the cervical, axillary  LUNGS: clear to auscultation and percussion with normal breathing effort HEART: regular rate & rhythm and no murmurs and no lower extremity edema ABDOMEN:abdomen soft, non-tender and normal bowel sounds Musculoskeletal:no cyanosis of digits and no clubbing  NEURO: alert & oriented x 3 with fluent speech, no focal motor/sensory deficits  LABORATORY DATA:  I have reviewed the data as listed CBC Latest Ref Rng & Units 10/09/2020 08/10/2020 06/09/2020  WBC 4.0 - 10.5 K/uL 4.5 4.7 5.1  Hemoglobin 13.0 - 17.0 g/dL 11.6(L) 11.3(L) 11.3(L)  Hematocrit 39.0 - 52.0 % 33.8(L) 32.6(L) 33.6(L)  Platelets 150 - 400 K/uL 185 186 168     CMP Latest Ref Rng & Units 10/09/2020 08/10/2020 06/09/2020  Glucose 70 - 99 mg/dL 95 99 95  BUN 8 -  23 mg/dL 17 20 18   Creatinine 0.61 - 1.24 mg/dL 0.85 0.83 0.98  Sodium 135 - 145 mmol/L 139 139 140  Potassium 3.5 - 5.1 mmol/L 4.2 4.1 4.1  Chloride 98 - 111 mmol/L 105 103 105  CO2 22 - 32 mmol/L 27 30 30   Calcium 8.9 - 10.3 mg/dL 8.9 9.0 9.4  Total Protein 6.5 - 8.1 g/dL 6.0(L) 5.9(L) 6.3(L)  Total Bilirubin 0.3 - 1.2 mg/dL 0.3 0.4 0.3  Alkaline Phos 38 - 126 U/L 69 70 80  AST 15 - 41  U/L 15 13(L) 11(L)  ALT 0 - 44 U/L 11 10 10       RADIOGRAPHIC STUDIES: I have personally reviewed the radiological images as listed and agreed with the findings in the report. No results found.   ASSESSMENT & PLAN:  Ryan Cowan is a 75 y.o. male with    1. 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 Hewas first treated with Sandostatin injections since 03/2015. He had disease progression in 9/2019with bone mets in spine, pelvic bone and sternum. -Hehas completed 5 treatments of Lutatherawith Dr. Leonia Reeves on 08/21/18-02/05/19. -DOTATATE PET scan from 03/12/19 shows very good response to [place] treatment butunlikely cured from this alone .  -We restarted monthly Sandostatin injections on 8/19/20through10/16/20 but symptoms ofdiarrhea, fatigue anddid not improvewhich islikely related to his persistent high tumor burden. -Istarted him onthird line therapyoralAfinitor7.5mg  dailybeginning 09/17/19.Tolerating well.Increased to full dose 10mg starting 12/11/19. Afinitor has been held since 04/2020 due to LE edema and skin erythema. Fatigue and skin has much improved off treatment.  -He is clinically doing well. Labs reviewed, Hg 11.6, protein 6, albumin 3.3. His 25 Hr Urine was elevated at 34.7. Chromogranin A still pending. Physical exam unremarkable.  If he continues to do well off Afinitor will continue to hold until disease progression.  -Proceed with Xgeva today and continue q67months. -F/u in 2 months with next CT CAP.    2. B/l LE Edema, L>R -2021 Doppler was negative for lower extremity DVT. Edema now resolved.  -He does still have mild skin erythema at left medial ankle on exam today (10/09/20)   3. Bone mets, Mild Hypocalcemia -first noticed on PET scan in 07/2018 -To reduce his risk of fractureIstarted him onmonthly Xgeva on1/14/21. Due to hypocalcemia will reduce to q54months starting  12/13/19, tolerated well. -Calcium normalized on increased oral calciumTID, will continuealong with Vit D.  4. 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 -Mild and stable   5. Anxiety/Depressionand fatigue -Has been anxious lately. He had not wanted to stay home alone. His sisters would stay with him even overnight and required ED visit.  -On Zoloft100mg  currently, Managed by Dr. Arelia Sneddon -Stable and improved further off Afinitor. He is more active with more energy and motivation.    Plan -Lab reviewed, will proceed Delton See today and continue every 2 months -We will continue hold Afinitor for now. Will continue with observation.  -F/u and Xgeva in 2 months with lab and CT CAP w contrast a few days before.    No problem-specific Assessment & Plan notes found for this encounter.   Orders Placed This Encounter  Procedures  . CT CHEST ABDOMEN PELVIS W CONTRAST    Standing Status:   Future    Standing Expiration Date:  10/09/2021    Order Specific Question:   If indicated for the ordered procedure, I authorize the administration of contrast media per Radiology protocol    Answer:   Yes    Order Specific Question:   Preferred imaging location?    Answer:   [facility]    Order Specific Question:   Release to patient    Answer:   Immediate    Order Specific Question:   Is Oral Contrast requested for this exam?    Answer:   Yes, Per Radiology protocol    Order Specific Question:   Reason for Exam (SYMPTOM  OR DIAGNOSIS REQUIRED)    Answer:   pt is off any active treatment, monitor carcinoma tumor   All questions were answered. The patient knows to call the clinic with any problems, questions or concerns. No barriers to learning was detected. The total time spent in the appointment was  30 minutes.     [name], MD 10/09/2020   I, Joslyn Devon, am acting as scribe for [name], MD.   I have reviewed the above documentation for accuracy and completeness, and I agree with the above.

## 2020-10-09 ENCOUNTER — Other Ambulatory Visit: Payer: Self-pay

## 2020-10-09 ENCOUNTER — Inpatient Hospital Stay: Payer: Medicare Other | Attending: Hematology | Admitting: Hematology

## 2020-10-09 ENCOUNTER — Inpatient Hospital Stay: Payer: Medicare Other

## 2020-10-09 ENCOUNTER — Telehealth: Payer: Self-pay | Admitting: Hematology

## 2020-10-09 VITALS — BP 154/76 | HR 53 | Temp 97.8°F | Resp 18 | Ht 73.0 in | Wt 152.2 lb

## 2020-10-09 DIAGNOSIS — C772 Secondary and unspecified malignant neoplasm of intra-abdominal lymph nodes: Secondary | ICD-10-CM | POA: Diagnosis not present

## 2020-10-09 DIAGNOSIS — F419 Anxiety disorder, unspecified: Secondary | ICD-10-CM | POA: Insufficient documentation

## 2020-10-09 DIAGNOSIS — D500 Iron deficiency anemia secondary to blood loss (chronic): Secondary | ICD-10-CM

## 2020-10-09 DIAGNOSIS — C787 Secondary malignant neoplasm of liver and intrahepatic bile duct: Secondary | ICD-10-CM | POA: Insufficient documentation

## 2020-10-09 DIAGNOSIS — D509 Iron deficiency anemia, unspecified: Secondary | ICD-10-CM | POA: Insufficient documentation

## 2020-10-09 DIAGNOSIS — C7A029 Malignant carcinoid tumor of the large intestine, unspecified portion: Secondary | ICD-10-CM | POA: Diagnosis present

## 2020-10-09 DIAGNOSIS — R5383 Other fatigue: Secondary | ICD-10-CM | POA: Diagnosis not present

## 2020-10-09 DIAGNOSIS — R600 Localized edema: Secondary | ICD-10-CM | POA: Diagnosis not present

## 2020-10-09 DIAGNOSIS — C7951 Secondary malignant neoplasm of bone: Secondary | ICD-10-CM | POA: Insufficient documentation

## 2020-10-09 LAB — CBC WITH DIFFERENTIAL/PLATELET
Abs Immature Granulocytes: 0.01 10*3/uL (ref 0.00–0.07)
Basophils Absolute: 0 10*3/uL (ref 0.0–0.1)
Basophils Relative: 1 %
Eosinophils Absolute: 0.1 10*3/uL (ref 0.0–0.5)
Eosinophils Relative: 1 %
HCT: 33.8 % — ABNORMAL LOW (ref 39.0–52.0)
Hemoglobin: 11.6 g/dL — ABNORMAL LOW (ref 13.0–17.0)
Immature Granulocytes: 0 %
Lymphocytes Relative: 14 %
Lymphs Abs: 0.6 10*3/uL — ABNORMAL LOW (ref 0.7–4.0)
MCH: 34.6 pg — ABNORMAL HIGH (ref 26.0–34.0)
MCHC: 34.3 g/dL (ref 30.0–36.0)
MCV: 100.9 fL — ABNORMAL HIGH (ref 80.0–100.0)
Monocytes Absolute: 0.6 10*3/uL (ref 0.1–1.0)
Monocytes Relative: 14 %
Neutro Abs: 3.2 10*3/uL (ref 1.7–7.7)
Neutrophils Relative %: 70 %
Platelets: 185 10*3/uL (ref 150–400)
RBC: 3.35 MIL/uL — ABNORMAL LOW (ref 4.22–5.81)
RDW: 12.2 % (ref 11.5–15.5)
WBC: 4.5 10*3/uL (ref 4.0–10.5)
nRBC: 0 % (ref 0.0–0.2)

## 2020-10-09 LAB — COMPREHENSIVE METABOLIC PANEL
ALT: 11 U/L (ref 0–44)
AST: 15 U/L (ref 15–41)
Albumin: 3.3 g/dL — ABNORMAL LOW (ref 3.5–5.0)
Alkaline Phosphatase: 69 U/L (ref 38–126)
Anion gap: 7 (ref 5–15)
BUN: 17 mg/dL (ref 8–23)
CO2: 27 mmol/L (ref 22–32)
Calcium: 8.9 mg/dL (ref 8.9–10.3)
Chloride: 105 mmol/L (ref 98–111)
Creatinine, Ser: 0.85 mg/dL (ref 0.61–1.24)
GFR, Estimated: >60 mL/min (ref >60)
Glucose, Bld: 95 mg/dL (ref 70–99)
Potassium: 4.2 mmol/L (ref 3.5–5.1)
Sodium: 139 mmol/L (ref 135–145)
Total Bilirubin: 0.3 mg/dL (ref 0.3–1.2)
Total Protein: 6 g/dL — ABNORMAL LOW (ref 6.5–8.1)

## 2020-10-09 MED ORDER — DENOSUMAB 120 MG/1.7ML ~~LOC~~ SOLN
120.0000 mg | Freq: Once | SUBCUTANEOUS | Status: AC
  Administered 2020-10-09: 120 mg via SUBCUTANEOUS

## 2020-10-09 MED ORDER — DENOSUMAB 120 MG/1.7ML ~~LOC~~ SOLN
SUBCUTANEOUS | Status: AC
  Filled 2020-10-09: qty 1.7

## 2020-10-09 NOTE — Patient Instructions (Signed)
Denosumab injection What is this medicine? DENOSUMAB (den oh sue mab) slows bone breakdown. Prolia is used to treat osteoporosis in women after menopause and in men, and in people who are taking corticosteroids for 6 months or more. Xgeva is used to treat a high calcium level due to cancer and to prevent bone fractures and other bone problems caused by multiple myeloma or cancer bone metastases. Xgeva is also used to treat giant cell tumor of the bone. This medicine may be used for other purposes; ask your health care provider or pharmacist if you have questions. COMMON BRAND NAME(S): Prolia, XGEVA What should I tell my health care provider before I take this medicine? They need to know if you have any of these conditions:  dental disease  having surgery or tooth extraction  infection  kidney disease  low levels of calcium or Vitamin D in the blood  malnutrition  on hemodialysis  skin conditions or sensitivity  thyroid or parathyroid disease  an unusual reaction to denosumab, other medicines, foods, dyes, or preservatives  pregnant or trying to get pregnant  breast-feeding How should I use this medicine? This medicine is for injection under the skin. It is given by a health care professional in a hospital or clinic setting. A special MedGuide will be given to you before each treatment. Be sure to read this information carefully each time. For Prolia, talk to your pediatrician regarding the use of this medicine in children. Special care may be needed. For Xgeva, talk to your pediatrician regarding the use of this medicine in children. While this drug may be prescribed for children as young as 13 years for selected conditions, precautions do apply. Overdosage: If you think you have taken too much of this medicine contact a poison control center or emergency room at once. NOTE: This medicine is only for you. Do not share this medicine with others. What if I miss a dose? It is  important not to miss your dose. Call your doctor or health care professional if you are unable to keep an appointment. What may interact with this medicine? Do not take this medicine with any of the following medications:  other medicines containing denosumab This medicine may also interact with the following medications:  medicines that lower your chance of fighting infection  steroid medicines like prednisone or cortisone This list may not describe all possible interactions. Give your health care provider a list of all the medicines, herbs, non-prescription drugs, or dietary supplements you use. Also tell them if you smoke, drink alcohol, or use illegal drugs. Some items may interact with your medicine. What should I watch for while using this medicine? Visit your doctor or health care professional for regular checks on your progress. Your doctor or health care professional may order blood tests and other tests to see how you are doing. Call your doctor or health care professional for advice if you get a fever, chills or sore throat, or other symptoms of a cold or flu. Do not treat yourself. This drug may decrease your body's ability to fight infection. Try to avoid being around people who are sick. You should make sure you get enough calcium and vitamin D while you are taking this medicine, unless your doctor tells you not to. Discuss the foods you eat and the vitamins you take with your health care professional. See your dentist regularly. Brush and floss your teeth as directed. Before you have any dental work done, tell your dentist you are   receiving this medicine. Do not become pregnant while taking this medicine or for 5 months after stopping it. Talk with your doctor or health care professional about your birth control options while taking this medicine. Women should inform their doctor if they wish to become pregnant or think they might be pregnant. There is a potential for serious side  effects to an unborn child. Talk to your health care professional or pharmacist for more information. What side effects may I notice from receiving this medicine? Side effects that you should report to your doctor or health care professional as soon as possible:  allergic reactions like skin rash, itching or hives, swelling of the face, lips, or tongue  bone pain  breathing problems  dizziness  jaw pain, especially after dental work  redness, blistering, peeling of the skin  signs and symptoms of infection like fever or chills; cough; sore throat; pain or trouble passing urine  signs of low calcium like fast heartbeat, muscle cramps or muscle pain; pain, tingling, numbness in the hands or feet; seizures  unusual bleeding or bruising  unusually weak or tired Side effects that usually do not require medical attention (report to your doctor or health care professional if they continue or are bothersome):  constipation  diarrhea  headache  joint pain  loss of appetite  muscle pain  runny nose  tiredness  upset stomach This list may not describe all possible side effects. Call your doctor for medical advice about side effects. You may report side effects to FDA at 1-800-FDA-1088. Where should I keep my medicine? This medicine is only given in a clinic, doctor's office, or other health care setting and will not be stored at home. NOTE: This sheet is a summary. It may not cover all possible information. If you have questions about this medicine, talk to your doctor, pharmacist, or health care provider.  2020 Elsevier/Gold Standard (2018-01-26 16:10:44)

## 2020-10-09 NOTE — Telephone Encounter (Signed)
Scheduled appointments per 1/7 los. Spoke to patient who is aware of appointments dates and times.  

## 2020-10-10 ENCOUNTER — Encounter: Payer: Self-pay | Admitting: Hematology

## 2020-10-12 LAB — CHROMOGRANIN A: Chromogranin A (ng/mL): 692.3 ng/mL — ABNORMAL HIGH (ref 0.0–101.8)

## 2020-12-04 ENCOUNTER — Inpatient Hospital Stay: Payer: Medicare Other | Attending: Hematology

## 2020-12-04 ENCOUNTER — Other Ambulatory Visit: Payer: Self-pay

## 2020-12-04 ENCOUNTER — Ambulatory Visit (HOSPITAL_COMMUNITY)
Admission: RE | Admit: 2020-12-04 | Discharge: 2020-12-04 | Disposition: A | Discharge status: Home / Self Care | Payer: Medicare Other | Source: Ambulatory Visit | Attending: Hematology | Admitting: Hematology

## 2020-12-04 DIAGNOSIS — C7A029 Malignant carcinoid tumor of the large intestine, unspecified portion: Secondary | ICD-10-CM | POA: Diagnosis present

## 2020-12-04 DIAGNOSIS — R5383 Other fatigue: Secondary | ICD-10-CM | POA: Insufficient documentation

## 2020-12-04 DIAGNOSIS — R600 Localized edema: Secondary | ICD-10-CM | POA: Insufficient documentation

## 2020-12-04 DIAGNOSIS — C787 Secondary malignant neoplasm of liver and intrahepatic bile duct: Secondary | ICD-10-CM | POA: Insufficient documentation

## 2020-12-04 DIAGNOSIS — D509 Iron deficiency anemia, unspecified: Secondary | ICD-10-CM | POA: Diagnosis not present

## 2020-12-04 DIAGNOSIS — F32A Depression, unspecified: Secondary | ICD-10-CM | POA: Insufficient documentation

## 2020-12-04 DIAGNOSIS — C7951 Secondary malignant neoplasm of bone: Secondary | ICD-10-CM | POA: Diagnosis present

## 2020-12-04 DIAGNOSIS — F419 Anxiety disorder, unspecified: Secondary | ICD-10-CM | POA: Insufficient documentation

## 2020-12-04 DIAGNOSIS — C772 Secondary and unspecified malignant neoplasm of intra-abdominal lymph nodes: Secondary | ICD-10-CM | POA: Diagnosis not present

## 2020-12-04 LAB — CBC WITH DIFFERENTIAL/PLATELET
Abs Immature Granulocytes: 0.01 10*3/uL (ref 0.00–0.07)
Basophils Absolute: 0 10*3/uL (ref 0.0–0.1)
Basophils Relative: 1 %
Eosinophils Absolute: 0.1 10*3/uL (ref 0.0–0.5)
Eosinophils Relative: 2 %
HCT: 34.6 % — ABNORMAL LOW (ref 39.0–52.0)
Hemoglobin: 11.8 g/dL — ABNORMAL LOW (ref 13.0–17.0)
Immature Granulocytes: 0 %
Lymphocytes Relative: 14 %
Lymphs Abs: 0.7 10*3/uL (ref 0.7–4.0)
MCH: 33.6 pg (ref 26.0–34.0)
MCHC: 34.1 g/dL (ref 30.0–36.0)
MCV: 98.6 fL (ref 80.0–100.0)
Monocytes Absolute: 0.6 10*3/uL (ref 0.1–1.0)
Monocytes Relative: 13 %
Neutro Abs: 3.6 10*3/uL (ref 1.7–7.7)
Neutrophils Relative %: 70 %
Platelets: 191 10*3/uL (ref 150–400)
RBC: 3.51 MIL/uL — ABNORMAL LOW (ref 4.22–5.81)
RDW: 12.2 % (ref 11.5–15.5)
WBC: 5.1 10*3/uL (ref 4.0–10.5)
nRBC: 0 % (ref 0.0–0.2)

## 2020-12-04 LAB — COMPREHENSIVE METABOLIC PANEL
ALT: 10 U/L (ref 0–44)
AST: 15 U/L (ref 15–41)
Albumin: 3.4 g/dL — ABNORMAL LOW (ref 3.5–5.0)
Alkaline Phosphatase: 72 U/L (ref 38–126)
Anion gap: 7 (ref 5–15)
BUN: 16 mg/dL (ref 8–23)
CO2: 29 mmol/L (ref 22–32)
Calcium: 9.1 mg/dL (ref 8.9–10.3)
Chloride: 104 mmol/L (ref 98–111)
Creatinine, Ser: 0.9 mg/dL (ref 0.61–1.24)
GFR, Estimated: >60 mL/min (ref >60)
Glucose, Bld: 95 mg/dL (ref 70–99)
Potassium: 4.1 mmol/L (ref 3.5–5.1)
Sodium: 140 mmol/L (ref 135–145)
Total Bilirubin: 0.2 mg/dL — ABNORMAL LOW (ref 0.3–1.2)
Total Protein: 6.2 g/dL — ABNORMAL LOW (ref 6.5–8.1)

## 2020-12-04 MED ORDER — IOHEXOL 300 MG/ML  SOLN
100.0000 mL | Freq: Once | INTRAMUSCULAR | Status: AC | PRN
  Administered 2020-12-04: 100 mL via INTRAVENOUS

## 2020-12-04 NOTE — Progress Notes (Signed)
[place]   [phone] [phone]   Clinic Follow up Note   Patient Care Team: Leonard Downing, MD as PCP - General (Family Medicine)  Date of Service:  12/07/2020  CHIEF COMPLAINT: F/u of Metastatic Carcinoid cancer  SUMMARY OF ONCOLOGIC HISTORY: Oncology History Overview Note  Carcinoid tumor of colon, malignant   Staging form: Colon and Rectum, AJCC 7th Edition     Clinical: Stage Unknown ([place], N2b, M1) - Unsigned     Carcinoid tumor of colon, malignant ([place])  02/16/2015 Imaging   CT abdomen and pelvis with contrast showed wall thickening of the cecum and terminal ileum high suspicious for cecal neoplasm. Extensive confluent ileal colic adenopathy extending chronically along the superior mesenteric vein, multiple hepatic metastasis   02/18/2015 Initial Diagnosis   Carcinoid tumor of colon, malignant, with liver metastasis   02/18/2015 Initial Biopsy   Liver biopsy showed metastatic low-grade neuroendocrine tumor (carcinoid). The tumor is positive for CD X2, CD 56, chromogranin, and synaptophysin.   03/05/2015 - 07/24/2018 Chemotherapy   Sandostatin 30 mg IM every 4 weeks. Due to disease progression regular monthly injections stopped after 07/24/18   03/06/2015 Imaging   CT chest showed no evidence of metastasis in the chest. 4.4 cm ascending aortic aneurysm.   09/09/2015 Imaging   Restaging CT abdomen and pelvis with contrast showed interval response of hepatic metastasis. Most are small and, some are stable. Slight interval decrease in mesenteric adenopathy and primary colon tumor.    12/06/2016 Imaging   CT CAP w contrast IMPRESSION: 1. Overall, the extent of metastatic disease to the liver and abdominal/retroperitoneal lymphatics is similar to the prior examination 08/19/2016. 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 2. Multiple small pulmonary nodules  appear unchanged in size, number and distribution, favored to be benign. 3. Aortic atherosclerosis, in addition to 2 vessel coronary artery disease. Assessment for potential risk factor modification, dietary therapy or pharmacologic therapy may be warranted, if clinically indicated. 4. In addition, there is dilatation of the aortic root (4.7 cm in diameter at the level of the sinuses of Valsalva) and ectasia of the ascending thoracic aorta measuring 4.4 cm in diameter. Recommend annual imaging followup by CTA or MRA. This recommendation follows 2010 ACCF/AHA/AATS/ACR/ASA/SCA/SCAI/SIR/STS/SVM Guidelines for the Diagnosis and Management of Patients with Thoracic Aortic Disease. Circulation. 2010; 121: J242-A834. 5. Trace volume of ascites. 6. Additional incidental findings, as above.    05/31/2017 Imaging   CT CAP W Contrast 05/31/17 IMPRESSION: 1. Overall the extent of metastatic disease to the liver and abdominal adenopathy is similar to previous exam from 12/06/2016. 2. 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 3. Stable small pulmonary nodules. 4.  Aortic Atherosclerosis (ICD10-I70.0). 5. Dilatation of the ascending thoracic aorta. Unchanged. Recommend annual imaging followup by CTA or MRA. This recommendation follows 2010 ACCF/AHA/AATS/ACR/ASA/SCA/SCAI/SIR/STS/SVM Guidelines for the Diagnosis and Management of Patients with Thoracic Aortic Disease. Circulation. 2010; 121: e266-e369 6. Trace ascites.   12/07/2017 Imaging   CT CAP W Contrast 12/07/17 IMPRESSION: Chest Impression: 1. No evidence of thoracic metastasis. 2. Stable ascending thoracic aortic aneurysm at 4.4 cm. Recommend attention on oncology surveillance. Abdomen / Pelvis Impression: 1. Mild interval increase in size of multifocal hepatic metastasis. 2. Large central small bowel mesenteric mass is unchanged 3. No bowel obstruction. 4. No skeletal  lesions    06/12/2018 Imaging   06/12/2018  CT AP IMPRESSION: Large mesenteric mass in the right mid abdomen, increased.  Multifocal hepatic metastases, increased.  Mild upper abdominal lymphadenopathy, mildly increased.  Masslike thickening involving the terminal ileum, suspicious for metastasis, similar to the prior. Additional nonspecific wall thickening involving multiple loops of small bowel in the pelvis.  Small volume pelvic ascites, mildly increased.   06/12/2018 Progression   IMPRESSION: Large mesenteric mass in the right mid abdomen, increased.  Multifocal hepatic metastases, increased.  Mild upper abdominal lymphadenopathy, mildly increased.  Masslike thickening involving the terminal ileum, suspicious for metastasis, similar to the prior. Additional nonspecific wall thickening involving multiple loops of small bowel in the pelvis.  Small volume pelvic ascites, mildly increased.   07/18/2018 PET scan   IMPRESSION: 1. Examination is positive for extensive neuroendocrine tumor receptor avid metastatic disease involving the liver, axial and appendicular skeleton, thoracic and abdominal lymph nodes. There is a dominant mass within the mesentery which is intensely radiotracer avid with a maximum dimension of 11.9 cm and an SUV max of 44.32. Within the distal small bowel loops there is a radiotracer avid lesion likely within the distal ileum. 2. When compared with CT from 06/13/2018 the tumor volume within the abdomen is not significantly changed in the interval. 3.  Aortic Atherosclerosis (ICD10-I70.0). 4. Coronary artery atherosclerotic calcifications 5. Nonobstructing left renal calculi   08/21/2018 - 02/05/2019 Chemotherapy   Lutathera treatment with Sandostatin injection 08/21/18, 10/16/18, 12/11/18, 02/05/19   03/12/2019 PET scan   IMPRESSION: 1. Marked interval positive response to peptide receptor radiotherapy. 2. Significant decrease in size and radiotracer  activity within liver metastasis. 3. Significant decrease in size and radiotracer activity of large peritoneal mass in RIGHT lower quadrant. 4. Decrease in size and radiotracer activity of retroperitoneal adenopathy in the upper abdomen. 5. Decrease in size and radiotracer activity or resolution of activity of the small skeletal metastasis.     05/22/2019 - 07/19/2019 Chemotherapy   Monthly Sandostatin injections restarted on 05/22/19-07/19/19. Did not improve symptoms of diarrhea, fatigue and did not improve which is likely related to his persistent high tumor burden.    06/17/2019 Imaging   CT AP W Contast  IMPRESSION: Multifocal hepatic metastases, unchanged from recent PET.   Mesenteric nodal metastasis in the right mid abdomen, unchanged from recent PET.   Stable wall thickening involving the distal ileum.   No focal osseous metastases are evident on CT.   Additional ancillary findings as above.     09/17/2019 - 04/2020 Chemotherapy   Third line therapy oral Afinitor 7.5mg  daily starting 09/17/19. Increased to full dose 10mg  starting 12/11/19. Afinitor held since 04/2020 due to LE edema and skin erythema. Now on observation   12/12/2019 Imaging   CT AP W Contrast IMPRESSION: 1. Liver metastases are all mildly decreased. 2. Conglomerate nodal mass in the right mesentery is mildly decreased. 3. No new or progressive metastatic disease in the abdomen or pelvis. 4. Chronic wall thickening in the distal and terminal ileum and cecum surrounding the ileocecal valve, unchanged. No evidence of bowel obstruction. 5. Aortic Atherosclerosis (ICD10-I70.0).   12/04/2020 Imaging   CT CAP  IMPRESSION: 1. Slight interval increase in size of a soft tissue mass in the right hemiabdomen mesentery, consistent with worsened mesenteric metastasis. 2. Unchanged thickened, tethered appearance of the cecal base and terminal ileum. 3. Multiple hypodense liver metastases are unchanged. 4. No  evidence of metastatic disease in the chest. No evidence of new metastatic disease in the abdomen or pelvis. 5. Unchanged enlargement  of the tubular ascending thoracic aorta measuring up to 4.3 x 4.2 cm. Recommend annual imaging followup by CTA or MRA. This recommendation follows 2010 ACCF/AHA/AATS/ACR/ASA/SCA/SCAI/SIR/STS/SVM Guidelines for the Diagnosis and Management of Patients with Thoracic Aortic Disease. Circulation. 2010; 121: H209-O709. Aortic aneurysm NOS (ICD10-I71.9) 6. Coronary artery disease. 7. Unchanged small volume fluid in the low right hemipelvis, likely loculated.   Aortic Atherosclerosis (ICD10-I70.0).        CURRENT THERAPY:  Observation Xgeva monthly starting 10/17/19. Due to hypocalcemia will reduce to q83months starting 12/13/19.   INTERVAL HISTORY:  Ryan Cowan is here for a follow up. He presents to the clinic with his sister. He notes he is doing well. He notes having occasional right lower quadrant discomfort. His diarrhea is overall resolved. He denies try hot flashes.    REVIEW OF SYSTEMS:   Constitutional: Denies fevers, chills or abnormal weight loss Eyes: Denies blurriness of vision Ears, nose, mouth, throat, and face: Denies mucositis or sore throat Respiratory: Denies cough, dyspnea or wheezes Cardiovascular: Denies palpitation, chest discomfort or lower extremity swelling Gastrointestinal:  Denies nausea, heartburn (+) Mostly resolved diarrhea (+) Occasional right lower quadrant discomfort  Skin: Denies abnormal skin rashes Lymphatics: Denies new lymphadenopathy or easy bruising Neurological:Denies numbness, tingling or new weaknesses Behavioral/Psych: Mood is stable, no new changes  All other systems were reviewed with the patient and are negative.  MEDICAL HISTORY:  Past Medical History:  Diagnosis Date  . Colon cancer ([place]) dx'd 02/2015  . Liver metastases ([place]) dx'd 02/2015  . Metastatic cancer to bone [facility]) dx'd 2019     SURGICAL HISTORY: Past Surgical History:  Procedure Laterality Date  . APPENDECTOMY    . KNEE SURGERY    . SHOULDER SURGERY      I have reviewed the social history and family history with the patient and they are unchanged from previous note.  ALLERGIES:  is allergic to codeine.  MEDICATIONS:  Current Outpatient Medications  Medication Sig Dispense Refill  . calcium-vitamin D (OSCAL WITH D) 500-200 MG-UNIT tablet Take 2 tablets by mouth daily with breakfast.    . cyanocobalamin 2000 MCG tablet Take 1,000 mcg by mouth daily.    Marland Kitchen levothyroxine (SYNTHROID) 100 MCG tablet Take 100 mcg by mouth daily.    . Multiple Vitamin (MULTIVITAMIN WITH MINERALS) TABS tablet Take 1 tablet by mouth daily. Centrum Silver 50 plus    . naproxen sodium (ANAPROX) 220 MG tablet Take 220 mg by mouth 2 (two) times daily as needed (pain).    Marland Kitchen OVER THE COUNTER MEDICATION Place 1 application into both nostrils daily as needed (congestion). Vicks inhaler stick    . sertraline (ZOLOFT) 100 MG tablet Take 100 mg by mouth daily.      No current facility-administered medications for this visit.    PHYSICAL EXAMINATION: ECOG PERFORMANCE STATUS: 1 - Symptomatic but completely ambulatory  Vitals:   12/07/20 1300  BP: 132/73  Pulse: 61  Resp: 15  Temp: 98.9 F (37.2 C)  SpO2: 97%   Filed Weights   12/07/20 1300  Weight: 151 lb 9.6 oz (68.8 kg)    Due to COVID19 we will limit examination to appearance. Patient had no complaints.  GENERAL:alert, no distress and comfortable SKIN: skin color normal, no rashes or significant lesions  (+) Mild lower extremity edema with skin erythema of left inner lower leg above ankle  EYES: normal, Conjunctiva are pink and non-injected, sclera clear  NEURO: alert & oriented x 3 with fluent speech  LABORATORY DATA:  I have reviewed the data as listed CBC Latest Ref Rng & Units 12/04/2020 10/09/2020 08/10/2020  WBC 4.0 - 10.5 K/uL 5.1 4.5 4.7  Hemoglobin 13.0 - 17.0  g/dL 11.8(L) 11.6(L) 11.3(L)  Hematocrit 39.0 - 52.0 % 34.6(L) 33.8(L) 32.6(L)  Platelets 150 - 400 K/uL 191 185 186     CMP Latest Ref Rng & Units 12/04/2020 10/09/2020 08/10/2020  Glucose 70 - 99 mg/dL 95 95 99  BUN 8 - 23 mg/dL 16 17 20   Creatinine 0.61 - 1.24 mg/dL 0.90 0.85 0.83  Sodium 135 - 145 mmol/L 140 139 139  Potassium 3.5 - 5.1 mmol/L 4.1 4.2 4.1  Chloride 98 - 111 mmol/L 104 105 103  CO2 22 - 32 mmol/L 29 27 30   Calcium 8.9 - 10.3 mg/dL 9.1 8.9 9.0  Total Protein 6.5 - 8.1 g/dL 6.2(L) 6.0(L) 5.9(L)  Total Bilirubin 0.3 - 1.2 mg/dL 0.2(L) 0.3 0.4  Alkaline Phos 38 - 126 U/L 72 69 70  AST 15 - 41 U/L 15 15 13(L)  ALT 0 - 44 U/L 10 11 10       RADIOGRAPHIC STUDIES: I have personally reviewed the radiological images as listed and agreed with the findings in the report. No results found.   ASSESSMENT & PLAN:  KELDRIC POYER is a 75 y.o. male with   1. 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 Hewas first treated with Sandostatin injections since 03/2015. He had disease progression in 9/2019with bone mets in spine, pelvic bone and sternum. -Hehas completed 5 treatments of Lutatherawith Dr. Leonia Reeves on 08/21/18-02/05/19. -DOTATATE PET scan from 03/12/19 shows very good response to [place] treatment butunlikely cured from this alone .  -We restarted monthly Sandostatin injections on 8/19/20through10/16/20 but symptoms ofdiarrhea, fatigue anddid not improvewhich islikely related to his persistent high tumor burden. -Istarted him onthird line therapyoralAfinitor7.5mg  dailybeginning 09/17/19.Increased to full dose 10mg starting 12/11/19.Afinitor has been held since 04/2020 due to LE edema and skin erythema, and did improve off treatment. If he continues to do well off Afinitor will continue to hold until disease progression. -We discussed his CT CAP from 12/04/20 which shows Slight interval increase  (about 10%) in size of a soft tissue mass in the right hemiabdomen mesentery, consistent with slightly worsened mesenteric metastasis. Other lesions including liver mets are stable. No evidence of metastatic disease in the chest. No evidence of new metastatic disease in the abdomen or pelvis. I reviewed with patient in detail today. This is overall stable disease. -He is clinically doing well with no major symptoms. Labs reviewed from last week, Hg 11.8, Protein 6.2, Albumin 3.4.  -Continue observation. Next scan in 6 months. I discussed if he has disease progression more than 20% or becomes symptomatic, will restart him on treatment, either Lutathera or Afinotor   -F/u in 2 months   2. B/l LE Edema, L>R -2021 Doppler was negative for lower extremity DVT. Edema is now mild.  -He does still have mild skin erythema at left medial ankle on exam today (13/7/22) If this itches I recommend he use Hydrocortisone cream.   3. Bone mets, Mild Hypocalcemia -first noticed on PET scan in 07/2018 -To reduce his risk of fractureIstarted him onmonthly Xgeva on1/14/21. Due to hypocalcemia will reduce to q72months starting 12/13/19, tolerated well. -Calcium normalized on increased oral calciumTID, will continuealong with Vit D.  4. 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 -Mild and stable  5. Anxiety/Depressionand fatigue -On Zoloft100mg  currently, Managed by Dr. Arelia Sneddon -Mood Stable and improved further off Afinitor. He is more active with more energy and motivation. I encouraged him to be active daily.    Plan -Lab reviewed, will proceed Delton See today and continue every 2 months -We will continue hold Afinotor for now. Will continue with observation. -F/u and Xgeva in 2 months    No problem-specific Assessment & Plan  notes found for this encounter.   No orders of the defined types were placed in this encounter.  All questions were answered. The patient knows to call the clinic with any problems, questions or concerns. No barriers to learning was detected. The total time spent in the appointment was 30 minutes.     [name], MD 12/07/2020   I, Joslyn Devon, am acting as scribe for [name], MD.   I have reviewed the above documentation for accuracy and completeness, and I agree with the above.

## 2020-12-07 ENCOUNTER — Telehealth: Payer: Self-pay | Admitting: Hematology

## 2020-12-07 ENCOUNTER — Encounter: Payer: Self-pay | Admitting: Hematology

## 2020-12-07 ENCOUNTER — Other Ambulatory Visit: Payer: Self-pay

## 2020-12-07 ENCOUNTER — Inpatient Hospital Stay: Payer: Medicare Other

## 2020-12-07 ENCOUNTER — Inpatient Hospital Stay: Payer: Medicare Other | Admitting: Hematology

## 2020-12-07 VITALS — BP 132/73 | HR 61 | Temp 98.9°F | Resp 15 | Ht 73.0 in | Wt 151.6 lb

## 2020-12-07 DIAGNOSIS — C7951 Secondary malignant neoplasm of bone: Secondary | ICD-10-CM | POA: Diagnosis not present

## 2020-12-07 DIAGNOSIS — D500 Iron deficiency anemia secondary to blood loss (chronic): Secondary | ICD-10-CM

## 2020-12-07 DIAGNOSIS — C7A029 Malignant carcinoid tumor of the large intestine, unspecified portion: Secondary | ICD-10-CM | POA: Diagnosis not present

## 2020-12-07 MED ORDER — DENOSUMAB 120 MG/1.7ML ~~LOC~~ SOLN
120.0000 mg | Freq: Once | SUBCUTANEOUS | Status: AC
  Administered 2020-12-07: 120 mg via SUBCUTANEOUS

## 2020-12-07 MED ORDER — DENOSUMAB 120 MG/1.7ML ~~LOC~~ SOLN
SUBCUTANEOUS | Status: AC
  Filled 2020-12-07: qty 1.7

## 2020-12-07 NOTE — Patient Instructions (Signed)
Denosumab injection What is this medicine? DENOSUMAB (den oh sue mab) slows bone breakdown. Prolia is used to treat osteoporosis in women after menopause and in men, and in people who are taking corticosteroids for 6 months or more. Xgeva is used to treat a high calcium level due to cancer and to prevent bone fractures and other bone problems caused by multiple myeloma or cancer bone metastases. Xgeva is also used to treat giant cell tumor of the bone. This medicine may be used for other purposes; ask your health care provider or pharmacist if you have questions. COMMON BRAND NAME(S): Prolia, XGEVA What should I tell my health care provider before I take this medicine? They need to know if you have any of these conditions:  dental disease  having surgery or tooth extraction  infection  kidney disease  low levels of calcium or Vitamin D in the blood  malnutrition  on hemodialysis  skin conditions or sensitivity  thyroid or parathyroid disease  an unusual reaction to denosumab, other medicines, foods, dyes, or preservatives  pregnant or trying to get pregnant  breast-feeding How should I use this medicine? This medicine is for injection under the skin. It is given by a health care professional in a hospital or clinic setting. A special MedGuide will be given to you before each treatment. Be sure to read this information carefully each time. For Prolia, talk to your pediatrician regarding the use of this medicine in children. Special care may be needed. For Xgeva, talk to your pediatrician regarding the use of this medicine in children. While this drug may be prescribed for children as young as 13 years for selected conditions, precautions do apply. Overdosage: If you think you have taken too much of this medicine contact a poison control center or emergency room at once. NOTE: This medicine is only for you. Do not share this medicine with others. What if I miss a dose? It is  important not to miss your dose. Call your doctor or health care professional if you are unable to keep an appointment. What may interact with this medicine? Do not take this medicine with any of the following medications:  other medicines containing denosumab This medicine may also interact with the following medications:  medicines that lower your chance of fighting infection  steroid medicines like prednisone or cortisone This list may not describe all possible interactions. Give your health care provider a list of all the medicines, herbs, non-prescription drugs, or dietary supplements you use. Also tell them if you smoke, drink alcohol, or use illegal drugs. Some items may interact with your medicine. What should I watch for while using this medicine? Visit your doctor or health care professional for regular checks on your progress. Your doctor or health care professional may order blood tests and other tests to see how you are doing. Call your doctor or health care professional for advice if you get a fever, chills or sore throat, or other symptoms of a cold or flu. Do not treat yourself. This drug may decrease your body's ability to fight infection. Try to avoid being around people who are sick. You should make sure you get enough calcium and vitamin D while you are taking this medicine, unless your doctor tells you not to. Discuss the foods you eat and the vitamins you take with your health care professional. See your dentist regularly. Brush and floss your teeth as directed. Before you have any dental work done, tell your dentist you are   receiving this medicine. Do not become pregnant while taking this medicine or for 5 months after stopping it. Talk with your doctor or health care professional about your birth control options while taking this medicine. Women should inform their doctor if they wish to become pregnant or think they might be pregnant. There is a potential for serious side  effects to an unborn child. Talk to your health care professional or pharmacist for more information. What side effects may I notice from receiving this medicine? Side effects that you should report to your doctor or health care professional as soon as possible:  allergic reactions like skin rash, itching or hives, swelling of the face, lips, or tongue  bone pain  breathing problems  dizziness  jaw pain, especially after dental work  redness, blistering, peeling of the skin  signs and symptoms of infection like fever or chills; cough; sore throat; pain or trouble passing urine  signs of low calcium like fast heartbeat, muscle cramps or muscle pain; pain, tingling, numbness in the hands or feet; seizures  unusual bleeding or bruising  unusually weak or tired Side effects that usually do not require medical attention (report to your doctor or health care professional if they continue or are bothersome):  constipation  diarrhea  headache  joint pain  loss of appetite  muscle pain  runny nose  tiredness  upset stomach This list may not describe all possible side effects. Call your doctor for medical advice about side effects. You may report side effects to FDA at 1-800-FDA-1088. Where should I keep my medicine? This medicine is only given in a clinic, doctor's office, or other health care setting and will not be stored at home. NOTE: This sheet is a summary. It may not cover all possible information. If you have questions about this medicine, talk to your doctor, pharmacist, or health care provider.  2021 Elsevier/Gold Standard (2018-01-26 16:10:44)

## 2020-12-07 NOTE — Telephone Encounter (Signed)
Scheduled appointments per 3/7 los. Spoke to patient who is aware of appointments date and times. Gave patient calendar print out.

## 2021-02-01 NOTE — Progress Notes (Signed)
[place]   [phone] [phone]   Clinic Follow up Note   Patient Care Team: Leonard Downing, MD as PCP - General (Family Medicine)  Date of Service:  02/05/2021  CHIEF COMPLAINT: F/u of Metastatic Carcinoid cancer  SUMMARY OF ONCOLOGIC HISTORY: Oncology History Overview Note  Carcinoid tumor of colon, malignant   Staging form: Colon and Rectum, AJCC 7th Edition     Clinical: Stage Unknown ([place], N2b, M1) - Unsigned     Carcinoid tumor of colon, malignant ([place])  02/16/2015 Imaging   CT abdomen and pelvis with contrast showed wall thickening of the cecum and terminal ileum high suspicious for cecal neoplasm. Extensive confluent ileal colic adenopathy extending chronically along the superior mesenteric vein, multiple hepatic metastasis   02/18/2015 Initial Diagnosis   Carcinoid tumor of colon, malignant, with liver metastasis   02/18/2015 Initial Biopsy   Liver biopsy showed metastatic low-grade neuroendocrine tumor (carcinoid). The tumor is positive for CD X2, CD 56, chromogranin, and synaptophysin.   03/05/2015 - 07/24/2018 Chemotherapy   Sandostatin 30 mg IM every 4 weeks. Due to disease progression regular monthly injections stopped after 07/24/18   03/06/2015 Imaging   CT chest showed no evidence of metastasis in the chest. 4.4 cm ascending aortic aneurysm.   09/09/2015 Imaging   Restaging CT abdomen and pelvis with contrast showed interval response of hepatic metastasis. Most are small and, some are stable. Slight interval decrease in mesenteric adenopathy and primary colon tumor.    12/06/2016 Imaging   CT CAP w contrast IMPRESSION: 1. Overall, the extent of metastatic disease to the liver and abdominal/retroperitoneal lymphatics is similar to the prior examination 08/19/2016. 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 2. Multiple small pulmonary nodules  appear unchanged in size, number and distribution, favored to be benign. 3. Aortic atherosclerosis, in addition to 2 vessel coronary artery disease. Assessment for potential risk factor modification, dietary therapy or pharmacologic therapy may be warranted, if clinically indicated. 4. In addition, there is dilatation of the aortic root (4.7 cm in diameter at the level of the sinuses of Valsalva) and ectasia of the ascending thoracic aorta measuring 4.4 cm in diameter. Recommend annual imaging followup by CTA or MRA. This recommendation follows 2010 ACCF/AHA/AATS/ACR/ASA/SCA/SCAI/SIR/STS/SVM Guidelines for the Diagnosis and Management of Patients with Thoracic Aortic Disease. Circulation. 2010; 121: W737-T062. 5. Trace volume of ascites. 6. Additional incidental findings, as above.    05/31/2017 Imaging   CT CAP W Contrast 05/31/17 IMPRESSION: 1. Overall the extent of metastatic disease to the liver and abdominal adenopathy is similar to previous exam from 12/06/2016. 2. 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 3. Stable small pulmonary nodules. 4.  Aortic Atherosclerosis (ICD10-I70.0). 5. Dilatation of the ascending thoracic aorta. Unchanged. Recommend annual imaging followup by CTA or MRA. This recommendation follows 2010 ACCF/AHA/AATS/ACR/ASA/SCA/SCAI/SIR/STS/SVM Guidelines for the Diagnosis and Management of Patients with Thoracic Aortic Disease. Circulation. 2010; 121: e266-e369 6. Trace ascites.   12/07/2017 Imaging   CT CAP W Contrast 12/07/17 IMPRESSION: Chest Impression: 1. No evidence of thoracic metastasis. 2. Stable ascending thoracic aortic aneurysm at 4.4 cm. Recommend attention on oncology surveillance. Abdomen / Pelvis Impression: 1. Mild interval increase in size of multifocal hepatic metastasis. 2. Large central small bowel mesenteric mass is unchanged 3. No bowel obstruction. 4. No skeletal  lesions    06/12/2018 Imaging   06/12/2018  CT AP IMPRESSION: Large mesenteric mass in the right mid abdomen, increased.  Multifocal hepatic metastases, increased.  Mild upper abdominal lymphadenopathy, mildly increased.  Masslike thickening involving the terminal ileum, suspicious for metastasis, similar to the prior. Additional nonspecific wall thickening involving multiple loops of small bowel in the pelvis.  Small volume pelvic ascites, mildly increased.   06/12/2018 Progression   IMPRESSION: Large mesenteric mass in the right mid abdomen, increased.  Multifocal hepatic metastases, increased.  Mild upper abdominal lymphadenopathy, mildly increased.  Masslike thickening involving the terminal ileum, suspicious for metastasis, similar to the prior. Additional nonspecific wall thickening involving multiple loops of small bowel in the pelvis.  Small volume pelvic ascites, mildly increased.   07/18/2018 PET scan   IMPRESSION: 1. Examination is positive for extensive neuroendocrine tumor receptor avid metastatic disease involving the liver, axial and appendicular skeleton, thoracic and abdominal lymph nodes. There is a dominant mass within the mesentery which is intensely radiotracer avid with a maximum dimension of 11.9 cm and an SUV max of 44.32. Within the distal small bowel loops there is a radiotracer avid lesion likely within the distal ileum. 2. When compared with CT from 06/13/2018 the tumor volume within the abdomen is not significantly changed in the interval. 3.  Aortic Atherosclerosis (ICD10-I70.0). 4. Coronary artery atherosclerotic calcifications 5. Nonobstructing left renal calculi   08/21/2018 - 02/05/2019 Chemotherapy   Lutathera treatment with Sandostatin injection 08/21/18, 10/16/18, 12/11/18, 02/05/19   03/12/2019 PET scan   IMPRESSION: 1. Marked interval positive response to peptide receptor radiotherapy. 2. Significant decrease in size and radiotracer  activity within liver metastasis. 3. Significant decrease in size and radiotracer activity of large peritoneal mass in RIGHT lower quadrant. 4. Decrease in size and radiotracer activity of retroperitoneal adenopathy in the upper abdomen. 5. Decrease in size and radiotracer activity or resolution of activity of the small skeletal metastasis.     05/22/2019 - 07/19/2019 Chemotherapy   Monthly Sandostatin injections restarted on 05/22/19-07/19/19. Did not improve symptoms of diarrhea, fatigue and did not improve which is likely related to his persistent high tumor burden.    06/17/2019 Imaging   CT AP W Contast  IMPRESSION: Multifocal hepatic metastases, unchanged from recent PET.   Mesenteric nodal metastasis in the right mid abdomen, unchanged from recent PET.   Stable wall thickening involving the distal ileum.   No focal osseous metastases are evident on CT.   Additional ancillary findings as above.     09/17/2019 - 04/2020 Chemotherapy   Third line therapy oral Afinitor 7.5mg  daily starting 09/17/19. Increased to full dose 10mg  starting 12/11/19. Afinitor held since 04/2020 due to LE edema and skin erythema. Now on observation   12/12/2019 Imaging   CT AP W Contrast IMPRESSION: 1. Liver metastases are all mildly decreased. 2. Conglomerate nodal mass in the right mesentery is mildly decreased. 3. No new or progressive metastatic disease in the abdomen or pelvis. 4. Chronic wall thickening in the distal and terminal ileum and cecum surrounding the ileocecal valve, unchanged. No evidence of bowel obstruction. 5. Aortic Atherosclerosis (ICD10-I70.0).   12/04/2020 Imaging   CT CAP  IMPRESSION: 1. Slight interval increase in size of a soft tissue mass in the right hemiabdomen mesentery, consistent with worsened mesenteric metastasis. 2. Unchanged thickened, tethered appearance of the cecal base and terminal ileum. 3. Multiple hypodense liver metastases are unchanged. 4. No  evidence of metastatic disease in the chest. No evidence of new metastatic disease in the abdomen or pelvis. 5. Unchanged enlargement  of the tubular ascending thoracic aorta measuring up to 4.3 x 4.2 cm. Recommend annual imaging followup by CTA or MRA. This recommendation follows 2010 ACCF/AHA/AATS/ACR/ASA/SCA/SCAI/SIR/STS/SVM Guidelines for the Diagnosis and Management of Patients with Thoracic Aortic Disease. Circulation. 2010; 121ML:[id number]. Aortic aneurysm NOS (ICD10-I71.9) 6. Coronary artery disease. 7. Unchanged small volume fluid in the low right hemipelvis, likely loculated.   Aortic Atherosclerosis (ICD10-I70.0).        CURRENT THERAPY:  Observation Xgeva monthly starting 10/17/19. Due to hypocalcemia will reduce to q66months starting 12/13/19.   INTERVAL HISTORY:  Ryan Cowan is here for a follow up. He was last seen by me 2 months ago. He presents to the clinic alone. He notes when he is up moving around he gets mildly unsteady. He notes he is doing well. He denies new issues overall, fever, night sweats or change in BM except occasional diarrhea. He notes occasional abdominal cramp, about 1-2 times a week. I reviewed medication list with him. He notes his throat will get dry and will start having a coughing fit. This has happen randomly, even at rest. He notes skin erythema around his left ankle has improved. He notes his energy is fair. He notes he will take breaks as needed. He is able to do everything himself and activities he wants. With housework he has mild decrease ROM in the left upper shoulder/neck.     REVIEW OF SYSTEMS:   Constitutional: Denies fevers, chills or abnormal weight loss Eyes: Denies blurriness of vision Ears, nose, mouth, throat, and face: Denies mucositis or sore throat Respiratory: Denies cough, dyspnea or wheezes (+) intermittent dry throat Cardiovascular: Denies palpitation, chest discomfort or lower extremity swelling Gastrointestinal:   Denies nausea, heartburn (+) Occasional abdominal discomfort  Skin: Denies abnormal skin rashes Lymphatics: Denies new lymphadenopathy or easy bruising Neurological:Denies numbness, tingling or new weaknesses Behavioral/Psych: Mood is stable, no new changes  All other systems were reviewed with the patient and are negative.  MEDICAL HISTORY:  Past Medical History:  Diagnosis Date  . Colon cancer ([place]) dx'd 02/2015  . Liver metastases ([place]) dx'd 02/2015  . Metastatic cancer to bone [facility]) dx'd 2019    SURGICAL HISTORY: Past Surgical History:  Procedure Laterality Date  . APPENDECTOMY    . KNEE SURGERY    . SHOULDER SURGERY      I have reviewed the social history and family history with the patient and they are unchanged from previous note.  ALLERGIES:  is allergic to codeine.  MEDICATIONS:  Current Outpatient Medications  Medication Sig Dispense Refill  . calcium-vitamin D (OSCAL WITH D) 500-200 MG-UNIT tablet Take 2 tablets by mouth daily with breakfast.    . cyanocobalamin 2000 MCG tablet Take 1,000 mcg by mouth daily.    Marland Kitchen levothyroxine (SYNTHROID) 100 MCG tablet Take 100 mcg by mouth daily.    . Multiple Vitamin (MULTIVITAMIN WITH MINERALS) TABS tablet Take 1 tablet by mouth daily. Centrum Silver 50 plus    . naproxen sodium (ANAPROX) 220 MG tablet Take 220 mg by mouth 2 (two) times daily as needed (pain).    Marland Kitchen OVER THE COUNTER MEDICATION Place 1 application into both nostrils daily as needed (congestion). Vicks inhaler stick    . sertraline (ZOLOFT) 100 MG tablet Take 100 mg by mouth daily.      No current facility-administered medications for this visit.    PHYSICAL EXAMINATION: ECOG PERFORMANCE STATUS: 1 - Symptomatic but completely ambulatory  Vitals:   02/05/21 1256  BP: Marland Kitchen)  150/74  Pulse: (!) 54  Resp: 18  Temp: 98.4 F (36.9 C)  SpO2: 96%   Filed Weights   02/05/21 1256  Weight: 150 lb 4.8 oz (68.2 kg)    GENERAL:alert, no distress and  comfortable SKIN: skin color, texture, turgor are normal, no rashes (+) Improved skin erythema of inner left ankle EYES: normal, Conjunctiva are pink and non-injected, sclera clear  NECK: supple, thyroid normal size, non-tender, without nodularity LYMPH:  no palpable lymphadenopathy in the cervical, axillary  LUNGS: clear to auscultation and percussion with normal breathing effort HEART: regular rate & rhythm and no murmurs and no lower extremity edema ABDOMEN:abdomen soft, non-tender and normal bowel sounds Musculoskeletal:no cyanosis of digits and no clubbing  NEURO: alert & oriented x 3 with fluent speech, no focal motor/sensory deficits  LABORATORY DATA:  I have reviewed the data as listed CBC Latest Ref Rng & Units 02/05/2021 12/04/2020 10/09/2020  WBC 4.0 - 10.5 K/uL 5.1 5.1 4.5  Hemoglobin 13.0 - 17.0 g/dL 11.9(L) 11.8(L) 11.6(L)  Hematocrit 39.0 - 52.0 % 35.3(L) 34.6(L) 33.8(L)  Platelets 150 - 400 K/uL 197 191 185     CMP Latest Ref Rng & Units 02/05/2021 12/04/2020 10/09/2020  Glucose 70 - 99 mg/dL 94 95 95  BUN 8 - 23 mg/dL 14 16 17   Creatinine 0.61 - 1.24 mg/dL 0.82 0.90 0.85  Sodium 135 - 145 mmol/L 140 140 139  Potassium 3.5 - 5.1 mmol/L 4.2 4.1 4.2  Chloride 98 - 111 mmol/L 102 104 105  CO2 22 - 32 mmol/L 29 29 27   Calcium 8.9 - 10.3 mg/dL 9.1 9.1 8.9  Total Protein 6.5 - 8.1 g/dL 6.1(L) 6.2(L) 6.0(L)  Total Bilirubin 0.3 - 1.2 mg/dL 0.3 0.2(L) 0.3  Alkaline Phos 38 - 126 U/L 72 72 69  AST 15 - 41 U/L 15 15 15   ALT 0 - 44 U/L 9 10 11       RADIOGRAPHIC STUDIES: I have personally reviewed the radiological images as listed and agreed with the findings in the report. No results found.   ASSESSMENT & PLAN:  Ryan Cowan is a 75 y.o. male with   1. 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 Hewas first treated with Sandostatin injections since 03/2015. He had disease progression in 9/2019with bone  mets in spine, pelvic bone and sternum. -Hehas completed 5 treatments of Lutatherawith Dr. Leonia Reeves on 08/21/18-02/05/19. -DOTATATE PET scan from 03/12/19 shows very good response to [place] treatment butunlikely cured from this alone .  -We restarted monthly Sandostatin injections on 8/19/20through10/16/20 but symptoms ofdiarrhea, fatigue anddid not improvewhich islikely related to his persistent high tumor burden. -Istarted him onthird line therapyoralAfinitor7.5mg  dailybeginning 09/17/19.Increased to full dose 10mg starting 12/11/19.Afinitor has been held since 04/2020 due to LE edema and skin erythema, and did improve off treatment. If he continues to do well off Afinitor will continue to hold until disease progression. -He is clinically stable. Labs reviewed, CBC and CMP WNL. Physical exam stable and benign. There is no clinical concern for progression -Continue observation. Next scan in 4 months. I discussed if he has disease progression more than 20% or becomes symptomatic, will restart him on treatment, either Lutathera or Afinotor   -F/u in 2 months    2. B/l LE Edema, L>R -2021Doppler was negative for lower extremity DVT. Edema is now mild.  -He does still have mild skin erythema at left medial ankle on exam today (13/7/22)If this  itches I recommend he use Hydrocortisone cream.   3. Bone mets, Mild Hypocalcemia -first noticed on PET scan in 07/2018 -To reduce his risk of fractureIstarted him onmonthly Xgeva on1/14/21.Due to hypocalcemia will reduce to q32months starting 12/13/19,tolerated well. -Calcium normalized on increased oral calciumTID, will continuealong with Vit D.  4. Iron deficient anemia  -Secondary to the blood loss from his cecum tumor, then since [place] treatment -He is not on oral iron supplement, but has been treated with IV Feraheme before with complete response. -Mild and stable  5. Anxiety/Depressionand fatigue -On  Zoloft100mg  currently, Managed by Dr. Arelia Sneddon -Mood Stable and improvedfurther off Afinitor. He is more active with more energy and motivation.I encouraged him to be active daily.     Plan -Lab reviewed, will proceed Delton See today and continue every 2 months -Will continue with observation. -Lab, F/u and Xgeva in 2 months. Will order scan at that time.    No problem-specific Assessment & Plan notes found for this encounter.   No orders of the defined types were placed in this encounter.  All questions were answered. The patient knows to call the clinic with any problems, questions or concerns. No barriers to learning was detected. The total time spent in the appointment was 30 minutes.     [name], MD 02/05/2021   I, Joslyn Devon, am acting as scribe for [name], MD.   I have reviewed the above documentation for accuracy and completeness, and I agree with the above.

## 2021-02-05 ENCOUNTER — Inpatient Hospital Stay: Payer: Medicare Other | Attending: Hematology

## 2021-02-05 ENCOUNTER — Inpatient Hospital Stay: Payer: Medicare Other | Admitting: Hematology

## 2021-02-05 ENCOUNTER — Other Ambulatory Visit: Payer: Self-pay

## 2021-02-05 ENCOUNTER — Inpatient Hospital Stay: Payer: Medicare Other

## 2021-02-05 ENCOUNTER — Encounter: Payer: Self-pay | Admitting: Hematology

## 2021-02-05 VITALS — BP 150/74 | HR 54 | Temp 98.4°F | Resp 18 | Ht 73.0 in | Wt 150.3 lb

## 2021-02-05 DIAGNOSIS — C7A029 Malignant carcinoid tumor of the large intestine, unspecified portion: Secondary | ICD-10-CM | POA: Diagnosis not present

## 2021-02-05 DIAGNOSIS — C787 Secondary malignant neoplasm of liver and intrahepatic bile duct: Secondary | ICD-10-CM | POA: Insufficient documentation

## 2021-02-05 DIAGNOSIS — R600 Localized edema: Secondary | ICD-10-CM | POA: Diagnosis not present

## 2021-02-05 DIAGNOSIS — C772 Secondary and unspecified malignant neoplasm of intra-abdominal lymph nodes: Secondary | ICD-10-CM | POA: Insufficient documentation

## 2021-02-05 DIAGNOSIS — C7951 Secondary malignant neoplasm of bone: Secondary | ICD-10-CM | POA: Insufficient documentation

## 2021-02-05 DIAGNOSIS — D500 Iron deficiency anemia secondary to blood loss (chronic): Secondary | ICD-10-CM

## 2021-02-05 DIAGNOSIS — R5383 Other fatigue: Secondary | ICD-10-CM | POA: Diagnosis not present

## 2021-02-05 DIAGNOSIS — F32A Depression, unspecified: Secondary | ICD-10-CM | POA: Insufficient documentation

## 2021-02-05 DIAGNOSIS — F419 Anxiety disorder, unspecified: Secondary | ICD-10-CM | POA: Diagnosis not present

## 2021-02-05 DIAGNOSIS — D509 Iron deficiency anemia, unspecified: Secondary | ICD-10-CM | POA: Diagnosis not present

## 2021-02-05 LAB — CBC WITH DIFFERENTIAL/PLATELET
Abs Immature Granulocytes: 0 10*3/uL (ref 0.00–0.07)
Basophils Absolute: 0.1 10*3/uL (ref 0.0–0.1)
Basophils Relative: 1 %
Eosinophils Absolute: 0.1 10*3/uL (ref 0.0–0.5)
Eosinophils Relative: 2 %
HCT: 35.3 % — ABNORMAL LOW (ref 39.0–52.0)
Hemoglobin: 11.9 g/dL — ABNORMAL LOW (ref 13.0–17.0)
Immature Granulocytes: 0 %
Lymphocytes Relative: 14 %
Lymphs Abs: 0.7 10*3/uL (ref 0.7–4.0)
MCH: 33.8 pg (ref 26.0–34.0)
MCHC: 33.7 g/dL (ref 30.0–36.0)
MCV: 100.3 fL — ABNORMAL HIGH (ref 80.0–100.0)
Monocytes Absolute: 0.6 10*3/uL (ref 0.1–1.0)
Monocytes Relative: 12 %
Neutro Abs: 3.6 10*3/uL (ref 1.7–7.7)
Neutrophils Relative %: 71 %
Platelets: 197 10*3/uL (ref 150–400)
RBC: 3.52 MIL/uL — ABNORMAL LOW (ref 4.22–5.81)
RDW: 12.4 % (ref 11.5–15.5)
WBC: 5.1 10*3/uL (ref 4.0–10.5)
nRBC: 0 % (ref 0.0–0.2)

## 2021-02-05 LAB — COMPREHENSIVE METABOLIC PANEL
ALT: 9 U/L (ref 0–44)
AST: 15 U/L (ref 15–41)
Albumin: 3.3 g/dL — ABNORMAL LOW (ref 3.5–5.0)
Alkaline Phosphatase: 72 U/L (ref 38–126)
Anion gap: 9 (ref 5–15)
BUN: 14 mg/dL (ref 8–23)
CO2: 29 mmol/L (ref 22–32)
Calcium: 9.1 mg/dL (ref 8.9–10.3)
Chloride: 102 mmol/L (ref 98–111)
Creatinine, Ser: 0.82 mg/dL (ref 0.61–1.24)
GFR, Estimated: >60 mL/min (ref >60)
Glucose, Bld: 94 mg/dL (ref 70–99)
Potassium: 4.2 mmol/L (ref 3.5–5.1)
Sodium: 140 mmol/L (ref 135–145)
Total Bilirubin: 0.3 mg/dL (ref 0.3–1.2)
Total Protein: 6.1 g/dL — ABNORMAL LOW (ref 6.5–8.1)

## 2021-02-05 LAB — FERRITIN: Ferritin: 37 ng/mL (ref 24–336)

## 2021-02-05 LAB — VITAMIN D 25 HYDROXY (VIT D DEFICIENCY, FRACTURES): Vit D, 25-Hydroxy: 29.84 ng/mL — ABNORMAL LOW (ref 30–100)

## 2021-02-05 MED ORDER — DENOSUMAB 120 MG/1.7ML ~~LOC~~ SOLN
SUBCUTANEOUS | Status: AC
  Filled 2021-02-05: qty 1.7

## 2021-02-05 MED ORDER — DENOSUMAB 120 MG/1.7ML ~~LOC~~ SOLN
120.0000 mg | Freq: Once | SUBCUTANEOUS | Status: AC
  Administered 2021-02-05: 120 mg via SUBCUTANEOUS

## 2021-02-05 NOTE — Patient Instructions (Signed)
Denosumab injection What is this medicine? DENOSUMAB (den oh sue mab) slows bone breakdown. Prolia is used to treat osteoporosis in women after menopause and in men, and in people who are taking corticosteroids for 6 months or more. Xgeva is used to treat a high calcium level due to cancer and to prevent bone fractures and other bone problems caused by multiple myeloma or cancer bone metastases. Xgeva is also used to treat giant cell tumor of the bone. This medicine may be used for other purposes; ask your health care provider or pharmacist if you have questions. COMMON BRAND NAME(S): Prolia, XGEVA What should I tell my health care provider before I take this medicine? They need to know if you have any of these conditions:  dental disease  having surgery or tooth extraction  infection  kidney disease  low levels of calcium or Vitamin D in the blood  malnutrition  on hemodialysis  skin conditions or sensitivity  thyroid or parathyroid disease  an unusual reaction to denosumab, other medicines, foods, dyes, or preservatives  pregnant or trying to get pregnant  breast-feeding How should I use this medicine? This medicine is for injection under the skin. It is given by a health care professional in a hospital or clinic setting. A special MedGuide will be given to you before each treatment. Be sure to read this information carefully each time. For Prolia, talk to your pediatrician regarding the use of this medicine in children. Special care may be needed. For Xgeva, talk to your pediatrician regarding the use of this medicine in children. While this drug may be prescribed for children as young as 13 years for selected conditions, precautions do apply. Overdosage: If you think you have taken too much of this medicine contact a poison control center or emergency room at once. NOTE: This medicine is only for you. Do not share this medicine with others. What if I miss a dose? It is  important not to miss your dose. Call your doctor or health care professional if you are unable to keep an appointment. What may interact with this medicine? Do not take this medicine with any of the following medications:  other medicines containing denosumab This medicine may also interact with the following medications:  medicines that lower your chance of fighting infection  steroid medicines like prednisone or cortisone This list may not describe all possible interactions. Give your health care provider a list of all the medicines, herbs, non-prescription drugs, or dietary supplements you use. Also tell them if you smoke, drink alcohol, or use illegal drugs. Some items may interact with your medicine. What should I watch for while using this medicine? Visit your doctor or health care professional for regular checks on your progress. Your doctor or health care professional may order blood tests and other tests to see how you are doing. Call your doctor or health care professional for advice if you get a fever, chills or sore throat, or other symptoms of a cold or flu. Do not treat yourself. This drug may decrease your body's ability to fight infection. Try to avoid being around people who are sick. You should make sure you get enough calcium and vitamin D while you are taking this medicine, unless your doctor tells you not to. Discuss the foods you eat and the vitamins you take with your health care professional. See your dentist regularly. Brush and floss your teeth as directed. Before you have any dental work done, tell your dentist you are   receiving this medicine. Do not become pregnant while taking this medicine or for 5 months after stopping it. Talk with your doctor or health care professional about your birth control options while taking this medicine. Women should inform their doctor if they wish to become pregnant or think they might be pregnant. There is a potential for serious side  effects to an unborn child. Talk to your health care professional or pharmacist for more information. What side effects may I notice from receiving this medicine? Side effects that you should report to your doctor or health care professional as soon as possible:  allergic reactions like skin rash, itching or hives, swelling of the face, lips, or tongue  bone pain  breathing problems  dizziness  jaw pain, especially after dental work  redness, blistering, peeling of the skin  signs and symptoms of infection like fever or chills; cough; sore throat; pain or trouble passing urine  signs of low calcium like fast heartbeat, muscle cramps or muscle pain; pain, tingling, numbness in the hands or feet; seizures  unusual bleeding or bruising  unusually weak or tired Side effects that usually do not require medical attention (report to your doctor or health care professional if they continue or are bothersome):  constipation  diarrhea  headache  joint pain  loss of appetite  muscle pain  runny nose  tiredness  upset stomach This list may not describe all possible side effects. Call your doctor for medical advice about side effects. You may report side effects to FDA at 1-800-FDA-1088. Where should I keep my medicine? This medicine is only given in a clinic, doctor's office, or other health care setting and will not be stored at home. NOTE: This sheet is a summary. It may not cover all possible information. If you have questions about this medicine, talk to your doctor, pharmacist, or health care provider.  2021 Elsevier/Gold Standard (2018-01-26 16:10:44)

## 2021-02-08 LAB — CHROMOGRANIN A: Chromogranin A (ng/mL): 936.6 ng/mL — ABNORMAL HIGH (ref 0.0–101.8)

## 2021-02-09 ENCOUNTER — Telehealth: Payer: Self-pay | Admitting: Hematology

## 2021-02-09 NOTE — Telephone Encounter (Signed)
Scheduled appts per 5/6 sch msg. Pt aware.  

## 2021-02-17 ENCOUNTER — Telehealth: Payer: Self-pay

## 2021-02-17 NOTE — Telephone Encounter (Signed)
-----   Message from [name], MD sent at 02/16/2021  7:20 PM EDT ----- Please let pt know his lab results. VitD level slightly low, make sure he is taking VitD supplement, tumor marker trending up, OK to continue watching. No other new concerns.   [name]  02/16/2021

## 2021-02-17 NOTE — Telephone Encounter (Signed)
Called to review most recent labs with pt no answer message left reminded pt to please take Vitamin D as prescribed advised to call for any questions or concerns and follow-up as scheduled

## 2021-04-09 ENCOUNTER — Other Ambulatory Visit: Payer: Self-pay

## 2021-04-09 ENCOUNTER — Ambulatory Visit: Payer: Medicare Other

## 2021-04-09 ENCOUNTER — Ambulatory Visit: Payer: Medicare Other | Admitting: Hematology

## 2021-04-09 ENCOUNTER — Inpatient Hospital Stay: Payer: Medicare Other | Admitting: Hematology

## 2021-04-09 ENCOUNTER — Inpatient Hospital Stay: Payer: Medicare Other

## 2021-04-09 ENCOUNTER — Inpatient Hospital Stay: Payer: Medicare Other | Attending: Hematology

## 2021-04-09 ENCOUNTER — Other Ambulatory Visit: Payer: Medicare Other

## 2021-04-09 VITALS — BP 146/73 | HR 61 | Temp 98.1°F | Resp 15 | Ht 73.0 in | Wt 146.3 lb

## 2021-04-09 DIAGNOSIS — C7951 Secondary malignant neoplasm of bone: Secondary | ICD-10-CM | POA: Insufficient documentation

## 2021-04-09 DIAGNOSIS — D500 Iron deficiency anemia secondary to blood loss (chronic): Secondary | ICD-10-CM

## 2021-04-09 DIAGNOSIS — C787 Secondary malignant neoplasm of liver and intrahepatic bile duct: Secondary | ICD-10-CM | POA: Insufficient documentation

## 2021-04-09 DIAGNOSIS — D509 Iron deficiency anemia, unspecified: Secondary | ICD-10-CM | POA: Diagnosis not present

## 2021-04-09 DIAGNOSIS — C772 Secondary and unspecified malignant neoplasm of intra-abdominal lymph nodes: Secondary | ICD-10-CM | POA: Diagnosis not present

## 2021-04-09 DIAGNOSIS — C7A029 Malignant carcinoid tumor of the large intestine, unspecified portion: Secondary | ICD-10-CM

## 2021-04-09 DIAGNOSIS — F32A Depression, unspecified: Secondary | ICD-10-CM | POA: Insufficient documentation

## 2021-04-09 DIAGNOSIS — R5383 Other fatigue: Secondary | ICD-10-CM | POA: Diagnosis not present

## 2021-04-09 DIAGNOSIS — F419 Anxiety disorder, unspecified: Secondary | ICD-10-CM | POA: Insufficient documentation

## 2021-04-09 DIAGNOSIS — Z79899 Other long term (current) drug therapy: Secondary | ICD-10-CM | POA: Insufficient documentation

## 2021-04-09 DIAGNOSIS — R600 Localized edema: Secondary | ICD-10-CM | POA: Insufficient documentation

## 2021-04-09 LAB — COMPREHENSIVE METABOLIC PANEL
ALT: 6 U/L (ref 0–44)
AST: 12 U/L — ABNORMAL LOW (ref 15–41)
Albumin: 3.2 g/dL — ABNORMAL LOW (ref 3.5–5.0)
Alkaline Phosphatase: 79 U/L (ref 38–126)
Anion gap: 7 (ref 5–15)
BUN: 14 mg/dL (ref 8–23)
CO2: 31 mmol/L (ref 22–32)
Calcium: 9.1 mg/dL (ref 8.9–10.3)
Chloride: 104 mmol/L (ref 98–111)
Creatinine, Ser: 0.83 mg/dL (ref 0.61–1.24)
GFR, Estimated: >60 mL/min (ref >60)
Glucose, Bld: 94 mg/dL (ref 70–99)
Potassium: 4.1 mmol/L (ref 3.5–5.1)
Sodium: 142 mmol/L (ref 135–145)
Total Bilirubin: 0.3 mg/dL (ref 0.3–1.2)
Total Protein: 6.1 g/dL — ABNORMAL LOW (ref 6.5–8.1)

## 2021-04-09 LAB — CBC WITH DIFFERENTIAL/PLATELET
Abs Immature Granulocytes: 0.01 10*3/uL (ref 0.00–0.07)
Basophils Absolute: 0 10*3/uL (ref 0.0–0.1)
Basophils Relative: 1 %
Eosinophils Absolute: 0.1 10*3/uL (ref 0.0–0.5)
Eosinophils Relative: 2 %
HCT: 34.8 % — ABNORMAL LOW (ref 39.0–52.0)
Hemoglobin: 12.1 g/dL — ABNORMAL LOW (ref 13.0–17.0)
Immature Granulocytes: 0 %
Lymphocytes Relative: 19 %
Lymphs Abs: 0.9 10*3/uL (ref 0.7–4.0)
MCH: 33.6 pg (ref 26.0–34.0)
MCHC: 34.8 g/dL (ref 30.0–36.0)
MCV: 96.7 fL (ref 80.0–100.0)
Monocytes Absolute: 0.7 10*3/uL (ref 0.1–1.0)
Monocytes Relative: 14 %
Neutro Abs: 3.2 10*3/uL (ref 1.7–7.7)
Neutrophils Relative %: 64 %
Platelets: 180 10*3/uL (ref 150–400)
RBC: 3.6 MIL/uL — ABNORMAL LOW (ref 4.22–5.81)
RDW: 12.2 % (ref 11.5–15.5)
WBC: 5 10*3/uL (ref 4.0–10.5)
nRBC: 0 % (ref 0.0–0.2)

## 2021-04-09 MED ORDER — DENOSUMAB 120 MG/1.7ML ~~LOC~~ SOLN
SUBCUTANEOUS | Status: AC
  Filled 2021-04-09: qty 1.7

## 2021-04-09 MED ORDER — DENOSUMAB 120 MG/1.7ML ~~LOC~~ SOLN
120.0000 mg | Freq: Once | SUBCUTANEOUS | Status: AC
  Administered 2021-04-09: 120 mg via SUBCUTANEOUS

## 2021-04-09 NOTE — Patient Instructions (Signed)
Denosumab injection What is this medication? DENOSUMAB (den oh sue mab) slows bone breakdown. Prolia is used to treat osteoporosis in women after menopause and in men, and in people who are taking corticosteroids for 6 months or more. Delton See is used to treat a high calcium level due to cancer and to prevent bone fractures and other bone problems caused by multiple myeloma or cancer bone metastases. Delton See is also used totreat giant cell tumor of the bone. This medicine may be used for other purposes; ask your health care provider orpharmacist if you have questions. COMMON BRAND NAME(S): Prolia, XGEVA What should I tell my care team before I take this medication? They need to know if you have any of these conditions: dental disease having surgery or tooth extraction infection kidney disease low levels of calcium or Vitamin D in the blood malnutrition on hemodialysis skin conditions or sensitivity thyroid or parathyroid disease an unusual reaction to denosumab, other medicines, foods, dyes, or preservatives pregnant or trying to get pregnant breast-feeding How should I use this medication? This medicine is for injection under the skin. It is given by a health careprofessional in a hospital or clinic setting. A special MedGuide will be given to you before each treatment. Be sure to readthis information carefully each time. For Prolia, talk to your pediatrician regarding the use of this medicine in children. Special care may be needed. For Delton See, talk to your pediatrician regarding the use of this medicine in children. While this drug may be prescribed for children as young as 13 years for selected conditions,precautions do apply. Overdosage: If you think you have taken too much of this medicine contact apoison control center or emergency room at once. NOTE: This medicine is only for you. Do not share this medicine with others. What if I miss a dose? It is important not to miss your dose. Call  your doctor or health careprofessional if you are unable to keep an appointment. What may interact with this medication? Do not take this medicine with any of the following medications: other medicines containing denosumab This medicine may also interact with the following medications: medicines that lower your chance of fighting infection steroid medicines like prednisone or cortisone This list may not describe all possible interactions. Give your health care provider a list of all the medicines, herbs, non-prescription drugs, or dietary supplements you use. Also tell them if you smoke, drink alcohol, or use illegaldrugs. Some items may interact with your medicine. What should I watch for while using this medication? Visit your doctor or health care professional for regular checks on your progress. Your doctor or health care professional may order blood tests andother tests to see how you are doing. Call your doctor or health care professional for advice if you get a fever, chills or sore throat, or other symptoms of a cold or flu. Do not treat yourself. This drug may decrease your body's ability to fight infection. Try toavoid being around people who are sick. You should make sure you get enough calcium and vitamin D while you are taking this medicine, unless your doctor tells you not to. Discuss the foods you eatand the vitamins you take with your health care professional. See your dentist regularly. Brush and floss your teeth as directed. Before youhave any dental work done, tell your dentist you are receiving this medicine. Do not become pregnant while taking this medicine or for 5 months after stopping it. Talk with your doctor or health care professional about your  birth control options while taking this medicine. Women should inform their doctor if they wish to become pregnant or think they might be pregnant. There is a potential for serious side effects to an unborn child. Talk to your health  careprofessional or pharmacist for more information. What side effects may I notice from receiving this medication? Side effects that you should report to your doctor or health care professionalas soon as possible: allergic reactions like skin rash, itching or hives, swelling of the face, lips, or tongue bone pain breathing problems dizziness jaw pain, especially after dental work redness, blistering, peeling of the skin signs and symptoms of infection like fever or chills; cough; sore throat; pain or trouble passing urine signs of low calcium like fast heartbeat, muscle cramps or muscle pain; pain, tingling, numbness in the hands or feet; seizures unusual bleeding or bruising unusually weak or tired Side effects that usually do not require medical attention (report to yourdoctor or health care professional if they continue or are bothersome): constipation diarrhea headache joint pain loss of appetite muscle pain runny nose tiredness upset stomach This list may not describe all possible side effects. Call your doctor for medical advice about side effects. You may report side effects to FDA at1-800-FDA-1088. Where should I keep my medication? This medicine is only given in a clinic, doctor's office, or other health caresetting and will not be stored at home. NOTE: This sheet is a summary. It may not cover all possible information. If you have questions about this medicine, talk to your doctor, pharmacist, orhealth care provider.  2022 Elsevier/Gold Standard (2018-01-26 16:10:44)

## 2021-04-09 NOTE — Progress Notes (Signed)
[place]   [phone] [phone]   Clinic Follow up Note   Patient Care Team: Leonard Downing, MD as PCP - General (Family Medicine)  Date of Service:  04/09/2021  CHIEF COMPLAINT: f/u of metastatic carcinoid cancer  SUMMARY OF ONCOLOGIC HISTORY: Oncology History Overview Note  Carcinoid tumor of colon, malignant   Staging form: Colon and Rectum, AJCC 7th Edition     Clinical: Stage Unknown ([place], N2b, M1) - Unsigned      Carcinoid tumor of colon, malignant ([place])  02/16/2015 Imaging   CT abdomen and pelvis with contrast showed wall thickening of the cecum and terminal ileum high suspicious for cecal neoplasm. Extensive confluent ileal colic adenopathy extending chronically along the superior mesenteric vein, multiple hepatic metastasis    02/18/2015 Initial Diagnosis   Carcinoid tumor of colon, malignant, with liver metastasis    02/18/2015 Initial Biopsy   Liver biopsy showed metastatic low-grade neuroendocrine tumor (carcinoid). The tumor is positive for CD X2, CD 56, chromogranin, and synaptophysin.    03/05/2015 - 07/24/2018 Chemotherapy   Sandostatin 30 mg IM every 4 weeks. Due to disease progression regular monthly injections stopped after 07/24/18   03/06/2015 Imaging   CT chest showed no evidence of metastasis in the chest. 4.4 cm ascending aortic aneurysm.    09/09/2015 Imaging   Restaging CT abdomen and pelvis with contrast showed interval response of hepatic metastasis. Most are small and, some are stable. Slight interval decrease in mesenteric adenopathy and primary colon tumor.     12/06/2016 Imaging   CT CAP w contrast IMPRESSION: 1. Overall, the extent of metastatic disease to the liver and abdominal/retroperitoneal lymphatics is similar to the prior examination 08/19/2016. 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 2. Multiple small  pulmonary nodules appear unchanged in size, number and distribution, favored to be benign. 3. Aortic atherosclerosis, in addition to 2 vessel coronary artery disease. Assessment for potential risk factor modification, dietary therapy or pharmacologic therapy may be warranted, if clinically indicated. 4. In addition, there is dilatation of the aortic root (4.7 cm in diameter at the level of the sinuses of Valsalva) and ectasia of the ascending thoracic aorta measuring 4.4 cm in diameter. Recommend annual imaging followup by CTA or MRA. This recommendation follows 2010 ACCF/AHA/AATS/ACR/ASA/SCA/SCAI/SIR/STS/SVM Guidelines for the Diagnosis and Management of Patients with Thoracic Aortic Disease. Circulation. 2010; 121: H474-Q595. 5. Trace volume of ascites. 6. Additional incidental findings, as above.      05/31/2017 Imaging   CT CAP W Contrast 05/31/17 IMPRESSION: 1. Overall the extent of metastatic disease to the liver and abdominal adenopathy is similar to previous exam from 12/06/2016. 2. 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 3. Stable small pulmonary nodules. 4.  Aortic Atherosclerosis (ICD10-I70.0). 5. Dilatation of the ascending thoracic aorta. Unchanged. Recommend annual imaging followup by CTA or MRA. This recommendation follows 2010 ACCF/AHA/AATS/ACR/ASA/SCA/SCAI/SIR/STS/SVM Guidelines for the Diagnosis and Management of Patients with Thoracic Aortic Disease. Circulation. 2010; 121: e266-e369 6. Trace ascites.    12/07/2017 Imaging   CT CAP W Contrast 12/07/17 IMPRESSION: Chest Impression:  1. No evidence of thoracic metastasis. 2. Stable ascending thoracic aortic aneurysm at 4.4 cm. Recommend attention on oncology surveillance.  Abdomen / Pelvis Impression:  1. Mild interval increase in size of multifocal hepatic metastasis. 2. Large central small bowel mesenteric mass is unchanged 3. No bowel  obstruction.  4. No skeletal lesions      06/12/2018 Imaging   06/12/2018 CT AP IMPRESSION: Large mesenteric mass in the right mid abdomen, increased.   Multifocal hepatic metastases, increased.   Mild upper abdominal lymphadenopathy, mildly increased.   Masslike thickening involving the terminal ileum, suspicious for metastasis, similar to the prior. Additional nonspecific wall thickening involving multiple loops of small bowel in the pelvis.   Small volume pelvic ascites, mildly increased.   06/12/2018 Progression   IMPRESSION: Large mesenteric mass in the right mid abdomen, increased.   Multifocal hepatic metastases, increased.   Mild upper abdominal lymphadenopathy, mildly increased.   Masslike thickening involving the terminal ileum, suspicious for metastasis, similar to the prior. Additional nonspecific wall thickening involving multiple loops of small bowel in the pelvis.   Small volume pelvic ascites, mildly increased.    07/18/2018 PET scan   IMPRESSION: 1. Examination is positive for extensive neuroendocrine tumor receptor avid metastatic disease involving the liver, axial and appendicular skeleton, thoracic and abdominal lymph nodes. There is a dominant mass within the mesentery which is intensely radiotracer avid with a maximum dimension of 11.9 cm and an SUV max of 44.32. Within the distal small bowel loops there is a radiotracer avid lesion likely within the distal ileum. 2. When compared with CT from 06/13/2018 the tumor volume within the abdomen is not significantly changed in the interval. 3.  Aortic Atherosclerosis (ICD10-I70.0). 4. Coronary artery atherosclerotic calcifications 5. Nonobstructing left renal calculi    08/21/2018 - 02/05/2019 Chemotherapy   Lutathera treatment with Sandostatin injection 08/21/18, 10/16/18, 12/11/18, 02/05/19    03/12/2019 PET scan   IMPRESSION: 1. Marked interval positive response to peptide receptor radiotherapy. 2.  Significant decrease in size and radiotracer activity within liver metastasis. 3. Significant decrease in size and radiotracer activity of large peritoneal mass in RIGHT lower quadrant. 4. Decrease in size and radiotracer activity of retroperitoneal adenopathy in the upper abdomen. 5. Decrease in size and radiotracer activity or resolution of activity of the small skeletal metastasis.     05/22/2019 - 07/19/2019 Chemotherapy   Monthly Sandostatin injections restarted on 05/22/19-07/19/19. Did not improve symptoms of diarrhea, fatigue and did not improve which is likely related to his persistent high tumor burden.    06/17/2019 Imaging   CT AP W Contast  IMPRESSION: Multifocal hepatic metastases, unchanged from recent PET.   Mesenteric nodal metastasis in the right mid abdomen, unchanged from recent PET.   Stable wall thickening involving the distal ileum.   No focal osseous metastases are evident on CT.   Additional ancillary findings as above.     09/17/2019 - 04/2020 Chemotherapy   Third line therapy oral Afinitor 7.5mg  daily starting 09/17/19. Increased to full dose 10mg  starting 12/11/19. Afinitor held since 04/2020 due to LE edema and skin erythema. Now on observation   12/12/2019 Imaging   CT AP W Contrast IMPRESSION: 1. Liver metastases are all mildly decreased. 2. Conglomerate nodal mass in the right mesentery is mildly decreased. 3. No new or progressive metastatic disease in the abdomen or pelvis. 4. Chronic wall thickening in the distal and terminal ileum and cecum surrounding the ileocecal valve, unchanged. No evidence of bowel obstruction. 5. Aortic Atherosclerosis (ICD10-I70.0).   12/04/2020 Imaging   CT CAP  IMPRESSION: 1. Slight interval increase in size of a soft tissue mass in the right hemiabdomen mesentery, consistent with worsened mesenteric metastasis. 2. Unchanged thickened, tethered appearance of the cecal base and terminal ileum. 3. Multiple  hypodense liver metastases  are unchanged. 4. No evidence of metastatic disease in the chest. No evidence of new metastatic disease in the abdomen or pelvis. 5. Unchanged enlargement of the tubular ascending thoracic aorta measuring up to 4.3 x 4.2 cm. Recommend annual imaging followup by CTA or MRA. This recommendation follows 2010 ACCF/AHA/AATS/ACR/ASA/SCA/SCAI/SIR/STS/SVM Guidelines for the Diagnosis and Management of Patients with Thoracic Aortic Disease. Circulation. 2010; 121: J194-R740. Aortic aneurysm NOS (ICD10-I71.9) 6. Coronary artery disease. 7. Unchanged small volume fluid in the low right hemipelvis, likely loculated.   Aortic Atherosclerosis (ICD10-I70.0).        CURRENT THERAPY:  Observation Xgeva monthly starting 10/17/19. Due to hypocalcemia will reduce to q64months starting 12/13/19.  INTERVAL HISTORY:  Ryan Cowan is here for a follow up of metastatic carcinoid cancer. He was last seen by me on 02/05/21. He presents to the clinic alone. His sister, who usual accompanies him, was not with him initially. His appointment time had changed, so she showed up late but was able to participate in our visit. Despite some weight loss, he notes he is eating well and wonders if he is eating more than he should. He reports more frequent bowel movements and intermittent stomach/intestinal pain that occurs when he gets up. He feels these are new from prior. He also notes decreased energy but adds that the hot weather may be contributing to this.   All other systems were reviewed with the patient and are negative.  MEDICAL HISTORY:  Past Medical History:  Diagnosis Date   Colon cancer ([place]) dx'd 02/2015   Liver metastases ([place]) dx'd 02/2015   Metastatic cancer to bone [facility]) dx'd 2019    SURGICAL HISTORY: Past Surgical History:  Procedure Laterality Date   APPENDECTOMY     KNEE SURGERY     SHOULDER SURGERY      I have reviewed the social history and family history with the  patient and they are unchanged from previous note.  ALLERGIES:  is allergic to codeine.  MEDICATIONS:  Current Outpatient Medications  Medication Sig Dispense Refill   calcium-vitamin D (OSCAL WITH D) 500-200 MG-UNIT tablet Take 2 tablets by mouth daily with breakfast.     cyanocobalamin 2000 MCG tablet Take 1,000 mcg by mouth daily.     levothyroxine (SYNTHROID) 100 MCG tablet Take 100 mcg by mouth daily.     Multiple Vitamin (MULTIVITAMIN WITH MINERALS) TABS tablet Take 1 tablet by mouth daily. Centrum Silver 50 plus     naproxen sodium (ANAPROX) 220 MG tablet Take 220 mg by mouth 2 (two) times daily as needed (pain).     OVER THE COUNTER MEDICATION Place 1 application into both nostrils daily as needed (congestion). Vicks inhaler stick     sertraline (ZOLOFT) 100 MG tablet Take 100 mg by mouth daily.      No current facility-administered medications for this visit.    PHYSICAL EXAMINATION: ECOG PERFORMANCE STATUS: 1 - Symptomatic but completely ambulatory  Vitals:   04/09/21 1428  BP: (!) 146/73  Pulse: 61  Resp: 15  Temp: 98.1 F (36.7 C)  SpO2: 97%   Filed Weights   04/09/21 1428  Weight: 146 lb 4.8 oz (66.4 kg)    GENERAL:alert, no distress and comfortable SKIN: skin color, texture, turgor are normal, no rashes or significant lesions EYES: normal, Conjunctiva are pink and non-injected, sclera clear  NECK: supple, thyroid normal size, non-tender, without nodularity LYMPH:  no palpable lymphadenopathy in the cervical, axillary  LUNGS: clear to auscultation and percussion with normal  breathing effort HEART: regular rate & rhythm and no murmurs and no lower extremity edema ABDOMEN:abdomen soft, non-tender and normal bowel sounds Musculoskeletal:no cyanosis of digits and no clubbing  NEURO: alert & oriented x 3 with fluent speech, no focal motor/sensory deficits  LABORATORY DATA:  I have reviewed the data as listed CBC Latest Ref Rng & Units 04/09/2021 02/05/2021 12/04/2020   WBC 4.0 - 10.5 K/uL 5.0 5.1 5.1  Hemoglobin 13.0 - 17.0 g/dL 12.1(L) 11.9(L) 11.8(L)  Hematocrit 39.0 - 52.0 % 34.8(L) 35.3(L) 34.6(L)  Platelets 150 - 400 K/uL 180 197 191     CMP Latest Ref Rng & Units 04/09/2021 02/05/2021 12/04/2020  Glucose 70 - 99 mg/dL 94 94 95  BUN 8 - 23 mg/dL 14 14 16   Creatinine 0.61 - 1.24 mg/dL 0.83 0.82 0.90  Sodium 135 - 145 mmol/L 142 140 140  Potassium 3.5 - 5.1 mmol/L 4.1 4.2 4.1  Chloride 98 - 111 mmol/L 104 102 104  CO2 22 - 32 mmol/L 31 29 29   Calcium 8.9 - 10.3 mg/dL 9.1 9.1 9.1  Total Protein 6.5 - 8.1 g/dL 6.1(L) 6.1(L) 6.2(L)  Total Bilirubin 0.3 - 1.2 mg/dL 0.3 0.3 0.2(L)  Alkaline Phos 38 - 126 U/L 79 72 72  AST 15 - 41 U/L 12(L) 15 15  ALT 0 - 44 U/L 6 9 10       RADIOGRAPHIC STUDIES: I have personally reviewed the radiological images as listed and agreed with the findings in the report. No results found.   ASSESSMENT & PLAN:  Ryan Cowan is a 75 y.o. male with   1. 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 He was first treated with Sandostatin injections since 03/2015. He had disease progression in 06/2018 with bone mets in spine, pelvic bone and sternum.  -He has completed 5 treatments of Lutathera with Dr. Leonia Reeves on 08/21/18-02/05/19.  -DOTATATE PET scan from 03/12/19 shows very good response to [place] treatment but unlikely cured from this alone . -We restarted monthly Sandostatin injections on 05/22/19 through 07/19/19 but symptoms of diarrhea, fatigue and did not improve which is likely related to his persistent high tumor burden. -I started him on third line therapy oral Afinitor 7.5mg  daily beginning 09/17/19. Increased to full dose 10mg  starting 12/11/19. Afinitor has been held since 04/2020 due to LE edema and skin erythema, and did improve off treatment.  -He continues to do well off Afinitor. We will continue to hold until disease progression. As such, he  continues under observation. -He is clinically stable. He does note some new abdominal symptoms. No liver enlargement or abdominal bloating on physical exam today. He is due for restaging CT in 2 months. He knows to contact us if his symptoms worsen, and we can move up his scan if needed. I reviewed that if evidence of progression is seen, we can restart treatment.    2. B/l LE Edema, L>R -2021 Doppler was negative for lower extremity DVT. Edema is now mild. I reminded him to put his feet up when he sits down. -He does still have mild skin erythema at left medial ankle on exam today (04/09/21), unchanged from prior. Edema has resolved    3. Bone mets, Mild Hypocalcemia  -first noticed on PET scan in 07/2018 -To reduce his risk of fracture I started him on monthly Xgeva on 10/17/19. Due to hypocalcemia will reduce to q54months starting 12/13/19, tolerated well.  -Calcium  normalized on increased oral calcium TID, will continue along with Vit D.    4. 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  -overall slowly improving, hgb 12.1 today (04/09/21)    5. Anxiety/Depression and fatigue  -On Zoloft 100mg  currently, Managed by Dr. Arelia Sneddon  -Mood Stable and improved further off Afinitor. He is more active with more energy and motivation. I encouraged him to be active daily.       Plan -proceed Xgeva today -Lab, F/u and Xgeva in 2 months with restaging CT prior to visit    No problem-specific Assessment & Plan notes found for this encounter.   Orders Placed This Encounter  Procedures   CT CHEST ABDOMEN PELVIS W CONTRAST    Standing Status:   Future    Standing Expiration Date:   04/09/2022    Order Specific Question:   If indicated for the ordered procedure, I authorize the administration of contrast media per Radiology protocol     Answer:   Yes    Order Specific Question:   Preferred imaging location?    Answer:   [facility]    Order Specific Question:   Release to patient    Answer:   Immediate    Order Specific Question:   Is Oral Contrast requested for this exam?    Answer:   Yes, Per Radiology protocol    Order Specific Question:   Reason for Exam (SYMPTOM  OR DIAGNOSIS REQUIRED)    Answer:   f/u metastatic neuroendocrine tumor, not on active treatment   All questions were answered. The patient knows to call the clinic with any problems, questions or concerns. No barriers to learning was detected. The total time spent in the appointment was 30 minutes.     [name], MD 04/11/2021   I, Wilburn Mylar, am acting as scribe for [name], MD.   I have reviewed the above documentation for accuracy and completeness, and I agree with the above.

## 2021-04-11 ENCOUNTER — Encounter: Payer: Self-pay | Admitting: Hematology

## 2021-04-15 ENCOUNTER — Telehealth: Payer: Self-pay | Admitting: Hematology

## 2021-04-15 NOTE — Telephone Encounter (Signed)
Scheduled appointment per 07/10 sch msg. Left message.

## 2021-06-08 ENCOUNTER — Other Ambulatory Visit: Payer: Self-pay

## 2021-06-08 ENCOUNTER — Ambulatory Visit (HOSPITAL_COMMUNITY)
Admission: RE | Admit: 2021-06-08 | Discharge: 2021-06-08 | Disposition: A | Discharge status: Home / Self Care | Payer: Medicare Other | Source: Ambulatory Visit | Attending: Hematology | Admitting: Hematology

## 2021-06-08 ENCOUNTER — Inpatient Hospital Stay: Payer: Medicare Other | Attending: Hematology

## 2021-06-08 DIAGNOSIS — F32A Depression, unspecified: Secondary | ICD-10-CM | POA: Diagnosis not present

## 2021-06-08 DIAGNOSIS — Z79899 Other long term (current) drug therapy: Secondary | ICD-10-CM | POA: Insufficient documentation

## 2021-06-08 DIAGNOSIS — R600 Localized edema: Secondary | ICD-10-CM | POA: Diagnosis not present

## 2021-06-08 DIAGNOSIS — C7951 Secondary malignant neoplasm of bone: Secondary | ICD-10-CM | POA: Diagnosis present

## 2021-06-08 DIAGNOSIS — F419 Anxiety disorder, unspecified: Secondary | ICD-10-CM | POA: Diagnosis not present

## 2021-06-08 DIAGNOSIS — C7A029 Malignant carcinoid tumor of the large intestine, unspecified portion: Secondary | ICD-10-CM | POA: Insufficient documentation

## 2021-06-08 DIAGNOSIS — C787 Secondary malignant neoplasm of liver and intrahepatic bile duct: Secondary | ICD-10-CM | POA: Diagnosis not present

## 2021-06-08 DIAGNOSIS — C772 Secondary and unspecified malignant neoplasm of intra-abdominal lymph nodes: Secondary | ICD-10-CM | POA: Diagnosis not present

## 2021-06-08 DIAGNOSIS — D509 Iron deficiency anemia, unspecified: Secondary | ICD-10-CM | POA: Diagnosis not present

## 2021-06-08 LAB — CBC WITH DIFFERENTIAL/PLATELET
Abs Immature Granulocytes: 0.01 10*3/uL (ref 0.00–0.07)
Basophils Absolute: 0 10*3/uL (ref 0.0–0.1)
Basophils Relative: 1 %
Eosinophils Absolute: 0.2 10*3/uL (ref 0.0–0.5)
Eosinophils Relative: 3 %
HCT: 36 % — ABNORMAL LOW (ref 39.0–52.0)
Hemoglobin: 12.2 g/dL — ABNORMAL LOW (ref 13.0–17.0)
Immature Granulocytes: 0 %
Lymphocytes Relative: 14 %
Lymphs Abs: 0.8 10*3/uL (ref 0.7–4.0)
MCH: 33.2 pg (ref 26.0–34.0)
MCHC: 33.9 g/dL (ref 30.0–36.0)
MCV: 97.8 fL (ref 80.0–100.0)
Monocytes Absolute: 0.7 10*3/uL (ref 0.1–1.0)
Monocytes Relative: 11 %
Neutro Abs: 4.4 10*3/uL (ref 1.7–7.7)
Neutrophils Relative %: 71 %
Platelets: 202 10*3/uL (ref 150–400)
RBC: 3.68 MIL/uL — ABNORMAL LOW (ref 4.22–5.81)
RDW: 12.4 % (ref 11.5–15.5)
WBC: 6.1 10*3/uL (ref 4.0–10.5)
nRBC: 0 % (ref 0.0–0.2)

## 2021-06-08 LAB — COMPREHENSIVE METABOLIC PANEL
ALT: 10 U/L (ref 0–44)
AST: 14 U/L — ABNORMAL LOW (ref 15–41)
Albumin: 3.3 g/dL — ABNORMAL LOW (ref 3.5–5.0)
Alkaline Phosphatase: 85 U/L (ref 38–126)
Anion gap: 11 (ref 5–15)
BUN: 16 mg/dL (ref 8–23)
CO2: 29 mmol/L (ref 22–32)
Calcium: 9.1 mg/dL (ref 8.9–10.3)
Chloride: 102 mmol/L (ref 98–111)
Creatinine, Ser: 0.89 mg/dL (ref 0.61–1.24)
GFR, Estimated: >60 mL/min (ref >60)
Glucose, Bld: 96 mg/dL (ref 70–99)
Potassium: 4 mmol/L (ref 3.5–5.1)
Sodium: 142 mmol/L (ref 135–145)
Total Bilirubin: 0.4 mg/dL (ref 0.3–1.2)
Total Protein: 6.2 g/dL — ABNORMAL LOW (ref 6.5–8.1)

## 2021-06-08 MED ORDER — IOHEXOL 350 MG/ML SOLN
100.0000 mL | Freq: Once | INTRAVENOUS | Status: AC | PRN
  Administered 2021-06-08: 80 mL via INTRAVENOUS

## 2021-06-09 LAB — CHROMOGRANIN A: Chromogranin A (ng/mL): 1561 ng/mL — ABNORMAL HIGH (ref 0.0–101.8)

## 2021-06-11 ENCOUNTER — Inpatient Hospital Stay: Payer: Medicare Other

## 2021-06-11 ENCOUNTER — Other Ambulatory Visit: Payer: Self-pay

## 2021-06-11 ENCOUNTER — Inpatient Hospital Stay: Payer: Medicare Other | Admitting: Hematology

## 2021-06-11 DIAGNOSIS — C7951 Secondary malignant neoplasm of bone: Secondary | ICD-10-CM | POA: Diagnosis not present

## 2021-06-25 ENCOUNTER — Other Ambulatory Visit: Payer: Self-pay

## 2021-06-25 ENCOUNTER — Inpatient Hospital Stay: Payer: Medicare Other | Admitting: Hematology

## 2021-06-25 ENCOUNTER — Inpatient Hospital Stay: Payer: Medicare Other

## 2021-06-25 VITALS — BP 128/60 | HR 58 | Resp 18 | Ht 73.0 in | Wt 139.5 lb

## 2021-06-25 DIAGNOSIS — C7951 Secondary malignant neoplasm of bone: Secondary | ICD-10-CM | POA: Diagnosis not present

## 2021-06-25 DIAGNOSIS — C7A029 Malignant carcinoid tumor of the large intestine, unspecified portion: Secondary | ICD-10-CM

## 2021-06-25 DIAGNOSIS — D500 Iron deficiency anemia secondary to blood loss (chronic): Secondary | ICD-10-CM

## 2021-06-25 LAB — COMPREHENSIVE METABOLIC PANEL
ALT: 8 U/L (ref 0–44)
AST: 9 U/L — ABNORMAL LOW (ref 15–41)
Albumin: 2.9 g/dL — ABNORMAL LOW (ref 3.5–5.0)
Alkaline Phosphatase: 76 U/L (ref 38–126)
Anion gap: 9 (ref 5–15)
BUN: 18 mg/dL (ref 8–23)
CO2: 28 mmol/L (ref 22–32)
Calcium: 9.1 mg/dL (ref 8.9–10.3)
Chloride: 104 mmol/L (ref 98–111)
Creatinine, Ser: 0.85 mg/dL (ref 0.61–1.24)
GFR, Estimated: >60 mL/min (ref >60)
Glucose, Bld: 98 mg/dL (ref 70–99)
Potassium: 3.9 mmol/L (ref 3.5–5.1)
Sodium: 141 mmol/L (ref 135–145)
Total Bilirubin: <0.2 mg/dL — ABNORMAL LOW (ref 0.3–1.2)
Total Protein: 6.4 g/dL — ABNORMAL LOW (ref 6.5–8.1)

## 2021-06-25 LAB — CBC WITH DIFFERENTIAL/PLATELET
Abs Immature Granulocytes: 0.03 10*3/uL (ref 0.00–0.07)
Basophils Absolute: 0.1 10*3/uL (ref 0.0–0.1)
Basophils Relative: 1 %
Eosinophils Absolute: 0.1 10*3/uL (ref 0.0–0.5)
Eosinophils Relative: 3 %
HCT: 34.3 % — ABNORMAL LOW (ref 39.0–52.0)
Hemoglobin: 11.6 g/dL — ABNORMAL LOW (ref 13.0–17.0)
Immature Granulocytes: 1 %
Lymphocytes Relative: 11 %
Lymphs Abs: 0.5 10*3/uL — ABNORMAL LOW (ref 0.7–4.0)
MCH: 33.2 pg (ref 26.0–34.0)
MCHC: 33.8 g/dL (ref 30.0–36.0)
MCV: 98.3 fL (ref 80.0–100.0)
Monocytes Absolute: 0.6 10*3/uL (ref 0.1–1.0)
Monocytes Relative: 14 %
Neutro Abs: 3.2 10*3/uL (ref 1.7–7.7)
Neutrophils Relative %: 70 %
Platelets: 278 10*3/uL (ref 150–400)
RBC: 3.49 MIL/uL — ABNORMAL LOW (ref 4.22–5.81)
RDW: 12.2 % (ref 11.5–15.5)
WBC: 4.5 10*3/uL (ref 4.0–10.5)
nRBC: 0 % (ref 0.0–0.2)

## 2021-06-25 MED ORDER — DENOSUMAB 120 MG/1.7ML ~~LOC~~ SOLN
120.0000 mg | Freq: Once | SUBCUTANEOUS | Status: AC
  Administered 2021-06-25: 120 mg via SUBCUTANEOUS
  Filled 2021-06-25: qty 1.7

## 2021-06-25 NOTE — Progress Notes (Signed)
[place]   Telephone:([phone] Fax:(336) [phone]   Clinic Follow up Note   Patient Care Team: Leonard Downing, MD as PCP - General (Family Medicine)  Date of Service:  06/25/2021  CHIEF COMPLAINT: f/u of metastatic carcinoid cancer  CURRENT THERAPY:  Observation Xgeva monthly starting 10/17/19. Due to hypocalcemia will reduce to q34months starting 12/13/19.  ASSESSMENT & PLAN:  Ryan Cowan is a 75 y.o. male with   1. 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 He was first treated with Sandostatin injections since 03/2015. He had disease progression in 06/2018 with bone mets in spine, pelvic bone and sternum.  -He has completed 5 treatments of Lutathera with Dr. Leonia Reeves on 08/21/18-02/05/19.  -DOTATATE PET scan from 03/12/19 shows very good response to [place] treatment but unlikely cured from this alone . -We restarted monthly Sandostatin injections on 05/22/19 through 07/19/19 but symptoms of diarrhea, fatigue and did not improve which is likely related to his persistent high tumor burden. -I started him on third line therapy oral Afinitor 7.5mg  daily beginning 09/17/19. Increased to full dose 10mg  starting 12/11/19. Afinitor has been held since 04/2020 due to LE edema and skin erythema, and did improve off treatment.  -CT CAP on 06/08/21 showed further mild increase in size of mesentery mass, stable liver metastases, and likely postinflammatory RML nodules. -His Chromogranin A tumor marker has also been rising. We discussed possibly restarting treatment if he has increasing symptoms. He would prefer to continue on observation for the time being. I discussed obtaining a DOTATATE PET scan next time to further evaluate his disease. We will plan for this in 6 months. -He is clinically stable. He does note some new abdominal symptoms. He knows to contact us if his symptoms worsen    2. B/l LE Edema,  L>R -2021 Doppler was negative for lower extremity DVT. Edema is now mild. I reminded him to put his feet up when he sits down. -He does still have mild skin erythema at left medial ankle on exam today (04/09/21), unchanged from prior. Edema has resolved    3. Bone mets, Mild Hypocalcemia  -first noticed on PET scan in 07/2018 -To reduce his risk of fracture I started him on monthly Xgeva on 10/17/19. Due to hypocalcemia will reduce to q66months starting 12/13/19, tolerated well. We moved to q80months starting 04/09/21 -Calcium normalized on increased oral calcium TID, will continue along with Vit D.    4. 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  -overall mild and stable, hgb 11.6 today (06/25/21)   5. Anxiety/Depression and fatigue  -On Zoloft 100mg  currently, Managed by Dr. Arelia Sneddon  -Mood Stable and improved further off Afinitor. He is more active with more energy and motivation. I encouraged him to be active daily.       Plan -proceed Xgeva today -Lab, F/u and Xgeva in 3 months   No problem-specific Assessment & Plan notes found for this encounter.   SUMMARY OF ONCOLOGIC HISTORY: Oncology History Overview Note  Carcinoid tumor of colon, malignant   Staging form: Colon and Rectum, AJCC 7th Edition     Clinical: Stage Unknown ([place], N2b, M1) - Unsigned     Carcinoid tumor of colon, malignant ([place])  02/16/2015 Imaging   CT abdomen  and pelvis with contrast showed wall thickening of the cecum and terminal ileum high suspicious for cecal neoplasm. Extensive confluent ileal colic adenopathy extending chronically along the superior mesenteric vein, multiple hepatic metastasis   02/18/2015 Initial Diagnosis   Carcinoid tumor of colon, malignant, with liver metastasis   02/18/2015 Initial Biopsy   Liver biopsy showed metastatic  low-grade neuroendocrine tumor (carcinoid). The tumor is positive for CD X2, CD 56, chromogranin, and synaptophysin.   03/05/2015 - 07/24/2018 Chemotherapy   Sandostatin 30 mg IM every 4 weeks. Due to disease progression regular monthly injections stopped after 07/24/18   03/06/2015 Imaging   CT chest showed no evidence of metastasis in the chest. 4.4 cm ascending aortic aneurysm.   09/09/2015 Imaging   Restaging CT abdomen and pelvis with contrast showed interval response of hepatic metastasis. Most are small and, some are stable. Slight interval decrease in mesenteric adenopathy and primary colon tumor.    12/06/2016 Imaging   CT CAP w contrast IMPRESSION: 1. Overall, the extent of metastatic disease to the liver and abdominal/retroperitoneal lymphatics is similar to the prior examination 08/19/2016. 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 2. Multiple small pulmonary nodules appear unchanged in size, number and distribution, favored to be benign. 3. Aortic atherosclerosis, in addition to 2 vessel coronary artery disease. Assessment for potential risk factor modification, dietary therapy or pharmacologic therapy may be warranted, if clinically indicated. 4. In addition, there is dilatation of the aortic root (4.7 cm in diameter at the level of the sinuses of Valsalva) and ectasia of the ascending thoracic aorta measuring 4.4 cm in diameter. Recommend annual imaging followup by CTA or MRA. This recommendation follows 2010 ACCF/AHA/AATS/ACR/ASA/SCA/SCAI/SIR/STS/SVM Guidelines for the Diagnosis and Management of Patients with Thoracic Aortic Disease. Circulation. 2010; 121: Z610-R604. 5. Trace volume of ascites. 6. Additional incidental findings, as above.     05/31/2017 Imaging   CT CAP W Contrast 05/31/17 IMPRESSION: 1. Overall the extent of metastatic disease to the liver and abdominal adenopathy is  similar to previous exam from 12/06/2016. 2. 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 3. Stable small pulmonary nodules. 4.  Aortic Atherosclerosis (ICD10-I70.0). 5. Dilatation of the ascending thoracic aorta. Unchanged. Recommend annual imaging followup by CTA or MRA. This recommendation follows 2010 ACCF/AHA/AATS/ACR/ASA/SCA/SCAI/SIR/STS/SVM Guidelines for the Diagnosis and Management of Patients with Thoracic Aortic Disease. Circulation. 2010; 121: e266-e369 6. Trace ascites.   12/07/2017 Imaging   CT CAP W Contrast 12/07/17 IMPRESSION: Chest Impression:  1. No evidence of thoracic metastasis. 2. Stable ascending thoracic aortic aneurysm at 4.4 cm. Recommend attention on oncology surveillance.  Abdomen / Pelvis Impression:  1. Mild interval increase in size of multifocal hepatic metastasis. 2. Large central small bowel mesenteric mass is unchanged 3. No bowel obstruction. 4. No skeletal lesions     06/12/2018 Imaging   06/12/2018 CT AP IMPRESSION: Large mesenteric mass in the right mid abdomen, increased.   Multifocal hepatic metastases, increased.   Mild upper abdominal lymphadenopathy, mildly increased.   Masslike thickening involving the terminal ileum, suspicious for metastasis, similar to the prior. Additional nonspecific wall thickening involving multiple loops of small bowel in the pelvis.   Small volume pelvic ascites, mildly increased.   06/12/2018 Progression   IMPRESSION: Large mesenteric mass in the right mid abdomen, increased.   Multifocal hepatic metastases, increased.   Mild upper abdominal lymphadenopathy, mildly increased.  Masslike thickening involving the terminal ileum, suspicious for metastasis, similar to the prior. Additional nonspecific wall thickening involving multiple loops of small bowel in the pelvis.   Small volume pelvic ascites, mildly increased.   07/18/2018 PET scan    IMPRESSION: 1. Examination is positive for extensive neuroendocrine tumor receptor avid metastatic disease involving the liver, axial and appendicular skeleton, thoracic and abdominal lymph nodes. There is a dominant mass within the mesentery which is intensely radiotracer avid with a maximum dimension of 11.9 cm and an SUV max of 44.32. Within the distal small bowel loops there is a radiotracer avid lesion likely within the distal ileum. 2. When compared with CT from 06/13/2018 the tumor volume within the abdomen is not significantly changed in the interval. 3.  Aortic Atherosclerosis (ICD10-I70.0). 4. Coronary artery atherosclerotic calcifications 5. Nonobstructing left renal calculi   08/21/2018 - 02/05/2019 Chemotherapy   Lutathera treatment with Sandostatin injection 08/21/18, 10/16/18, 12/11/18, 02/05/19   03/12/2019 PET scan   IMPRESSION: 1. Marked interval positive response to peptide receptor radiotherapy. 2. Significant decrease in size and radiotracer activity within liver metastasis. 3. Significant decrease in size and radiotracer activity of large peritoneal mass in RIGHT lower quadrant. 4. Decrease in size and radiotracer activity of retroperitoneal adenopathy in the upper abdomen. 5. Decrease in size and radiotracer activity or resolution of activity of the small skeletal metastasis.     05/22/2019 - 07/19/2019 Chemotherapy   Monthly Sandostatin injections restarted on 05/22/19-07/19/19. Did not improve symptoms of diarrhea, fatigue and did not improve which is likely related to his persistent high tumor burden.    06/17/2019 Imaging   CT AP W Contast  IMPRESSION: Multifocal hepatic metastases, unchanged from recent PET.   Mesenteric nodal metastasis in the right mid abdomen, unchanged from recent PET.   Stable wall thickening involving the distal ileum.   No focal osseous metastases are evident on CT.   Additional ancillary findings as above.     09/17/2019 -  04/2020 Chemotherapy   Third line therapy oral Afinitor 7.5mg  daily starting 09/17/19. Increased to full dose 10mg  starting 12/11/19. Afinitor held since 04/2020 due to LE edema and skin erythema. Now on observation   12/12/2019 Imaging   CT AP W Contrast IMPRESSION: 1. Liver metastases are all mildly decreased. 2. Conglomerate nodal mass in the right mesentery is mildly decreased. 3. No new or progressive metastatic disease in the abdomen or pelvis. 4. Chronic wall thickening in the distal and terminal ileum and cecum surrounding the ileocecal valve, unchanged. No evidence of bowel obstruction. 5. Aortic Atherosclerosis (ICD10-I70.0).   12/04/2020 Imaging   CT CAP  IMPRESSION: 1. Slight interval increase in size of a soft tissue mass in the right hemiabdomen mesentery, consistent with worsened mesenteric metastasis. 2. Unchanged thickened, tethered appearance of the cecal base and terminal ileum. 3. Multiple hypodense liver metastases are unchanged. 4. No evidence of metastatic disease in the chest. No evidence of new metastatic disease in the abdomen or pelvis. 5. Unchanged enlargement of the tubular ascending thoracic aorta measuring up to 4.3 x 4.2 cm. Recommend annual imaging followup by CTA or MRA. This recommendation follows 2010 ACCF/AHA/AATS/ACR/ASA/SCA/SCAI/SIR/STS/SVM Guidelines for the Diagnosis and Management of Patients with Thoracic Aortic Disease. Circulation. 2010; 121: V371-G626. Aortic aneurysm NOS (ICD10-I71.9) 6. Coronary artery disease. 7. Unchanged small volume fluid in the low right hemipelvis, likely loculated.   Aortic Atherosclerosis (ICD10-I70.0).        INTERVAL HISTORY:  Ryan Cowan is here for a follow up  of metastatic carcinoid cancer. He was last seen by me on 04/09/21. He presents to the clinic accompanied by his sister. He reports fatigue and diarrhea. He notes the diarrhea will also occur at night. He notes it will vary based on what he eats  but has 2-3 BM a day. He endorses using imodium daily. He also reports some back pain. He also notes some dysphagia, with food feeling like it gets caught in the back of his throat.   All other systems were reviewed with the patient and are negative.  MEDICAL HISTORY:  Past Medical History:  Diagnosis Date   Colon cancer ([place]) dx'd 02/2015   Liver metastases ([place]) dx'd 02/2015   Metastatic cancer to bone [facility]) dx'd 2019    SURGICAL HISTORY: Past Surgical History:  Procedure Laterality Date   APPENDECTOMY     KNEE SURGERY     SHOULDER SURGERY      I have reviewed the social history and family history with the patient and they are unchanged from previous note.  ALLERGIES:  is allergic to codeine.  MEDICATIONS:  Current Outpatient Medications  Medication Sig Dispense Refill   calcium-vitamin D (OSCAL WITH D) 500-200 MG-UNIT tablet Take 2 tablets by mouth daily with breakfast.     cyanocobalamin 2000 MCG tablet Take 1,000 mcg by mouth daily.     levothyroxine (SYNTHROID) 100 MCG tablet Take 100 mcg by mouth daily.     Multiple Vitamin (MULTIVITAMIN WITH MINERALS) TABS tablet Take 1 tablet by mouth daily. Centrum Silver 50 plus     naproxen sodium (ANAPROX) 220 MG tablet Take 220 mg by mouth 2 (two) times daily as needed (pain).     OVER THE COUNTER MEDICATION Place 1 application into both nostrils daily as needed (congestion). Vicks inhaler stick     sertraline (ZOLOFT) 100 MG tablet Take 100 mg by mouth daily.      No current facility-administered medications for this visit.    PHYSICAL EXAMINATION: ECOG PERFORMANCE STATUS: 1 - Symptomatic but completely ambulatory  There were no vitals filed for this visit. Wt Readings from Last 3 Encounters:  04/09/21 146 lb 4.8 oz (66.4 kg)  02/05/21 150 lb 4.8 oz (68.2 kg)  12/07/20 151 lb 9.6 oz (68.8 kg)     GENERAL:alert, no distress and comfortable SKIN: skin color normal, no rashes or significant lesions EYES: normal,  Conjunctiva are pink and non-injected, sclera clear  NEURO: alert & oriented x 3 with fluent speech  LABORATORY DATA:  I have reviewed the data as listed CBC Latest Ref Rng & Units 06/25/2021 06/08/2021 04/09/2021  WBC 4.0 - 10.5 K/uL 4.5 6.1 5.0  Hemoglobin 13.0 - 17.0 g/dL 11.6(L) 12.2(L) 12.1(L)  Hematocrit 39.0 - 52.0 % 34.3(L) 36.0(L) 34.8(L)  Platelets 150 - 400 K/uL 278 202 180     CMP Latest Ref Rng & Units 06/08/2021 04/09/2021 02/05/2021  Glucose 70 - 99 mg/dL 96 94 94  BUN 8 - 23 mg/dL 16 14 14   Creatinine 0.61 - 1.24 mg/dL 0.89 0.83 0.82  Sodium 135 - 145 mmol/L 142 142 140  Potassium 3.5 - 5.1 mmol/L 4.0 4.1 4.2  Chloride 98 - 111 mmol/L 102 104 102  CO2 22 - 32 mmol/L 29 31 29   Calcium 8.9 - 10.3 mg/dL 9.1 9.1 9.1  Total Protein 6.5 - 8.1 g/dL 6.2(L) 6.1(L) 6.1(L)  Total Bilirubin 0.3 - 1.2 mg/dL 0.4 0.3 0.3  Alkaline Phos 38 - 126 U/L 85 79 72  AST 15 -  41 U/L 14(L) 12(L) 15  ALT 0 - 44 U/L 10 6 9       RADIOGRAPHIC STUDIES: I have personally reviewed the radiological images as listed and agreed with the findings in the report. No results found.    No orders of the defined types were placed in this encounter.  All questions were answered. The patient knows to call the clinic with any problems, questions or concerns. No barriers to learning was detected. The total time spent in the appointment was 30 minutes.     [name], MD 06/25/2021   I, Wilburn Mylar, am acting as scribe for [name], MD.   I have reviewed the above documentation for accuracy and completeness, and I agree with the above.

## 2021-06-26 ENCOUNTER — Encounter: Payer: Self-pay | Admitting: Hematology

## 2021-07-12 LAB — 5 HIAA, QUANTITATIVE, URINE, 24 HOUR
5-HIAA, Ur: 49.8 mg/L
5-HIAA,Quant.,24 Hr Urine: 87.2 mg/24 hr — ABNORMAL HIGH (ref 0.0–14.9)
Total Volume: 1750

## 2021-07-15 ENCOUNTER — Telehealth: Payer: Self-pay | Admitting: Hematology

## 2021-07-15 NOTE — Telephone Encounter (Signed)
Rescheduled per provider sch change, message left with pt

## 2021-09-23 ENCOUNTER — Telehealth: Payer: Self-pay | Admitting: Pharmacist

## 2021-09-23 ENCOUNTER — Inpatient Hospital Stay: Payer: Medicare Other | Admitting: Hematology

## 2021-09-23 ENCOUNTER — Inpatient Hospital Stay: Payer: Medicare Other | Attending: Hematology

## 2021-09-23 ENCOUNTER — Other Ambulatory Visit: Payer: Self-pay

## 2021-09-23 ENCOUNTER — Encounter: Payer: Self-pay | Admitting: Hematology

## 2021-09-23 ENCOUNTER — Inpatient Hospital Stay: Payer: Medicare Other

## 2021-09-23 ENCOUNTER — Telehealth: Payer: Self-pay

## 2021-09-23 ENCOUNTER — Other Ambulatory Visit (HOSPITAL_COMMUNITY): Payer: Self-pay

## 2021-09-23 VITALS — BP 122/50 | HR 56 | Temp 97.7°F | Resp 18 | Ht 73.0 in | Wt 132.9 lb

## 2021-09-23 DIAGNOSIS — D509 Iron deficiency anemia, unspecified: Secondary | ICD-10-CM | POA: Insufficient documentation

## 2021-09-23 DIAGNOSIS — F419 Anxiety disorder, unspecified: Secondary | ICD-10-CM | POA: Insufficient documentation

## 2021-09-23 DIAGNOSIS — D500 Iron deficiency anemia secondary to blood loss (chronic): Secondary | ICD-10-CM

## 2021-09-23 DIAGNOSIS — R5383 Other fatigue: Secondary | ICD-10-CM | POA: Insufficient documentation

## 2021-09-23 DIAGNOSIS — F32A Depression, unspecified: Secondary | ICD-10-CM | POA: Insufficient documentation

## 2021-09-23 DIAGNOSIS — Z79899 Other long term (current) drug therapy: Secondary | ICD-10-CM | POA: Diagnosis not present

## 2021-09-23 DIAGNOSIS — C772 Secondary and unspecified malignant neoplasm of intra-abdominal lymph nodes: Secondary | ICD-10-CM | POA: Insufficient documentation

## 2021-09-23 DIAGNOSIS — C7951 Secondary malignant neoplasm of bone: Secondary | ICD-10-CM | POA: Insufficient documentation

## 2021-09-23 DIAGNOSIS — C7A029 Malignant carcinoid tumor of the large intestine, unspecified portion: Secondary | ICD-10-CM | POA: Diagnosis present

## 2021-09-23 DIAGNOSIS — R600 Localized edema: Secondary | ICD-10-CM | POA: Diagnosis not present

## 2021-09-23 DIAGNOSIS — C787 Secondary malignant neoplasm of liver and intrahepatic bile duct: Secondary | ICD-10-CM | POA: Diagnosis not present

## 2021-09-23 LAB — CBC WITH DIFFERENTIAL/PLATELET
Abs Immature Granulocytes: 0.01 10*3/uL (ref 0.00–0.07)
Basophils Absolute: 0.1 10*3/uL (ref 0.0–0.1)
Basophils Relative: 1 %
Eosinophils Absolute: 0.1 10*3/uL (ref 0.0–0.5)
Eosinophils Relative: 2 %
HCT: 35.2 % — ABNORMAL LOW (ref 39.0–52.0)
Hemoglobin: 11.6 g/dL — ABNORMAL LOW (ref 13.0–17.0)
Immature Granulocytes: 0 %
Lymphocytes Relative: 11 %
Lymphs Abs: 0.6 10*3/uL — ABNORMAL LOW (ref 0.7–4.0)
MCH: 32 pg (ref 26.0–34.0)
MCHC: 33 g/dL (ref 30.0–36.0)
MCV: 97 fL (ref 80.0–100.0)
Monocytes Absolute: 0.7 10*3/uL (ref 0.1–1.0)
Monocytes Relative: 11 %
Neutro Abs: 4.6 10*3/uL (ref 1.7–7.7)
Neutrophils Relative %: 75 %
Platelets: 252 10*3/uL (ref 150–400)
RBC: 3.63 MIL/uL — ABNORMAL LOW (ref 4.22–5.81)
RDW: 12.7 % (ref 11.5–15.5)
WBC: 6.1 10*3/uL (ref 4.0–10.5)
nRBC: 0 % (ref 0.0–0.2)

## 2021-09-23 LAB — COMPREHENSIVE METABOLIC PANEL
ALT: 9 U/L (ref 0–44)
AST: 13 U/L — ABNORMAL LOW (ref 15–41)
Albumin: 3.5 g/dL (ref 3.5–5.0)
Alkaline Phosphatase: 85 U/L (ref 38–126)
Anion gap: 7 (ref 5–15)
BUN: 17 mg/dL (ref 8–23)
CO2: 28 mmol/L (ref 22–32)
Calcium: 8.8 mg/dL — ABNORMAL LOW (ref 8.9–10.3)
Chloride: 105 mmol/L (ref 98–111)
Creatinine, Ser: 0.81 mg/dL (ref 0.61–1.24)
GFR, Estimated: >60 mL/min (ref >60)
Glucose, Bld: 78 mg/dL (ref 70–99)
Potassium: 3.9 mmol/L (ref 3.5–5.1)
Sodium: 140 mmol/L (ref 135–145)
Total Bilirubin: 0.3 mg/dL (ref 0.3–1.2)
Total Protein: 6.3 g/dL — ABNORMAL LOW (ref 6.5–8.1)

## 2021-09-23 LAB — FERRITIN: Ferritin: 93 ng/mL (ref 24–336)

## 2021-09-23 LAB — VITAMIN D 25 HYDROXY (VIT D DEFICIENCY, FRACTURES): Vit D, 25-Hydroxy: 54.2 ng/mL (ref 30–100)

## 2021-09-23 MED ORDER — OCTREOTIDE ACETATE 20 MG IM KIT
20.0000 mg | PACK | Freq: Once | INTRAMUSCULAR | Status: AC
  Administered 2021-09-23: 12:00:00 20 mg via INTRAMUSCULAR
  Filled 2021-09-23: qty 1

## 2021-09-23 MED ORDER — DENOSUMAB 120 MG/1.7ML ~~LOC~~ SOLN
120.0000 mg | Freq: Once | SUBCUTANEOUS | Status: AC
  Administered 2021-09-23: 12:00:00 120 mg via SUBCUTANEOUS
  Filled 2021-09-23: qty 1.7

## 2021-09-23 MED ORDER — XERMELO 250 MG PO TABS: 250.0000 mg | ORAL_TABLET | Freq: Three times a day (TID) | ORAL | 1 refills | Status: DC

## 2021-09-23 MED ORDER — EVEROLIMUS 10 MG PO TABS: 10.0000 mg | ORAL_TABLET | Freq: Every day | ORAL | 0 refills | Status: DC

## 2021-09-23 MED ORDER — XERMELO 250 MG PO TABS
250.0000 mg | ORAL_TABLET | Freq: Three times a day (TID) | ORAL | 1 refills | Status: DC
  Filled 2021-09-23: qty 90, fill #0

## 2021-09-23 MED ORDER — EVEROLIMUS 10 MG PO TABS
10.0000 mg | ORAL_TABLET | Freq: Every day | ORAL | 0 refills | Status: DC
Start: 2021-09-23 — End: 2021-09-29
  Filled 2021-09-23: qty 30, 30d supply, fill #0

## 2021-09-23 NOTE — Telephone Encounter (Signed)
Oral Oncology Patient Advocate Encounter  Prior Authorization for Everolimus has been approved.    PA# [id number] Effective dates: 09/23/21 through 09/23/22   Oral Oncology Clinic will continue to follow.  [place] Patient [place] Phone [phone] Fax [phone] 09/23/2021 3:02 PM

## 2021-09-23 NOTE — Progress Notes (Signed)
Pt cannot get PET Dotatate Scan until 28 days post last Sandostatin injection which was given 09/23/2021 per Roderic Ovens in Nuclear Medicine.  Pt can be scheduled for PET after 10/22/2021.  Roderic Ovens will call Dr. Ernestina Penna office when pt is scheduled.

## 2021-09-23 NOTE — Progress Notes (Signed)
PET Dotatate approved per Mahalia Longest.  LVM for Ryan Cowan in Nuclear Medicine [phone]) to give this RN a call to schedule PET before 10/06/2021.  Faxed over orders to [facility] in Nuclear Medicine as well at [phone].  Awaiting response from [facility] to get PET scheduled.

## 2021-09-23 NOTE — Telephone Encounter (Signed)
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  CBC w/ Diff and CMP from 09/23/21 assessed, no relevant lab abnormalities noted requiring dose adjustments. Prescription doses and frequencies assessed for appropriateness. Appropriate for therapy initiation.   Current medication list in Epic reviewed, no relevant/significant DDIs with Afinitor or Xermelo identified.  Evaluated chart and no patient barriers to medication adherence noted.   Patient agreement for treatment documented in MD note on 09/23/21.  Prescription has been e-scribed to the [facility] for benefits analysis and approval.  Oral Oncology Clinic will continue to follow for insurance authorization, copayment issues, initial counseling and start date.  Leron Croak, PharmD, BCPS Hematology/Oncology Clinical Pharmacist [facility] and [place] [phone] 09/23/2021 2:34 PM

## 2021-09-23 NOTE — Telephone Encounter (Signed)
Oral Oncology Pharmacist Encounter   Received notification from [facility] that prior authorization for Ryan Cowan is required.   PA submitted on CoverMyMeds Key: [place] Status is pending   Oral Oncology Clinic will continue to follow.   Leron Croak, PharmD, BCPS Hematology/Oncology Clinical Pharmacist [place] [facility] [phone] 09/23/2021 3:08 PM

## 2021-09-23 NOTE — Telephone Encounter (Addendum)
Oral Oncology Pharmacist Encounter  Prior Authorization for Payton Doughty has been approved.    PA# [id number] Effective dates: 09/23/21 through 09/23/22  Patient's co-pay is $1412.20  Oral Oncology Clinic will continue to follow.   Leron Croak, PharmD, BCPS Hematology/Oncology Clinical Pharmacist [place] [facility] [phone] 09/23/2021 3:09 PM

## 2021-09-23 NOTE — Telephone Encounter (Addendum)
Oral Oncology Patient Advocate Encounter   Received notification from [facility] that prior authorization for Afinitor is required.   PA submitted on CoverMyMeds Key [id number] Status is pending   Oral Oncology Clinic will continue to follow.  [place] Patient [place] Phone [phone] Fax [phone] 09/23/2021 12:14 PM

## 2021-09-23 NOTE — Patient Instructions (Addendum)
Denosumab injection What is this medication? DENOSUMAB (den oh sue mab) slows bone breakdown. Prolia is used to treat osteoporosis in women after menopause and in men, and in people who are taking corticosteroids for 6 months or more. Delton See is used to treat a high calcium level due to cancer and to prevent bone fractures and other bone problems caused by multiple myeloma or cancer bone metastases. Delton See is also used to treat giant cell tumor of the bone. This medicine may be used for other purposes; ask your health care provider or pharmacist if you have questions. COMMON BRAND NAME(S): Prolia, XGEVA What should I tell my care team before I take this medication? They need to know if you have any of these conditions: dental disease having surgery or tooth extraction infection kidney disease low levels of calcium or Vitamin D in the blood malnutrition on hemodialysis skin conditions or sensitivity thyroid or parathyroid disease an unusual reaction to denosumab, other medicines, foods, dyes, or preservatives pregnant or trying to get pregnant breast-feeding How should I use this medication? This medicine is for injection under the skin. It is given by a health care professional in a hospital or clinic setting. A special MedGuide will be given to you before each treatment. Be sure to read this information carefully each time. For Prolia, talk to your pediatrician regarding the use of this medicine in children. Special care may be needed. For Delton See, talk to your pediatrician regarding the use of this medicine in children. While this drug may be prescribed for children as young as 13 years for selected conditions, precautions do apply. Overdosage: If you think you have taken too much of this medicine contact a poison control center or emergency room at once. NOTE: This medicine is only for you. Do not share this medicine with others. What if I miss a dose? It is important not to miss your dose.  Call your doctor or health care professional if you are unable to keep an appointment. What may interact with this medication? Do not take this medicine with any of the following medications: other medicines containing denosumab This medicine may also interact with the following medications: medicines that lower your chance of fighting infection steroid medicines like prednisone or cortisone This list may not describe all possible interactions. Give your health care provider a list of all the medicines, herbs, non-prescription drugs, or dietary supplements you use. Also tell them if you smoke, drink alcohol, or use illegal drugs. Some items may interact with your medicine. What should I watch for while using this medication? Visit your doctor or health care professional for regular checks on your progress. Your doctor or health care professional may order blood tests and other tests to see how you are doing. Call your doctor or health care professional for advice if you get a fever, chills or sore throat, or other symptoms of a cold or flu. Do not treat yourself. This drug may decrease your body's ability to fight infection. Try to avoid being around people who are sick. You should make sure you get enough calcium and vitamin D while you are taking this medicine, unless your doctor tells you not to. Discuss the foods you eat and the vitamins you take with your health care professional. See your dentist regularly. Brush and floss your teeth as directed. Before you have any dental work done, tell your dentist you are receiving this medicine. Do not become pregnant while taking this medicine or for 5 months after  stopping it. Talk with your doctor or health care professional about your birth control options while taking this medicine. Women should inform their doctor if they wish to become pregnant or think they might be pregnant. There is a potential for serious side effects to an unborn child. Talk to  your health care professional or pharmacist for more information. What side effects may I notice from receiving this medication? Side effects that you should report to your doctor or health care professional as soon as possible: allergic reactions like skin rash, itching or hives, swelling of the face, lips, or tongue bone pain breathing problems dizziness jaw pain, especially after dental work redness, blistering, peeling of the skin signs and symptoms of infection like fever or chills; cough; sore throat; pain or trouble passing urine signs of low calcium like fast heartbeat, muscle cramps or muscle pain; pain, tingling, numbness in the hands or feet; seizures unusual bleeding or bruising unusually weak or tired Side effects that usually do not require medical attention (report to your doctor or health care professional if they continue or are bothersome): constipation diarrhea headache joint pain loss of appetite muscle pain runny nose tiredness upset stomach This list may not describe all possible side effects. Call your doctor for medical advice about side effects. You may report side effects to FDA at 1-800-FDA-1088. Where should I keep my medication? This medicine is only given in a clinic, doctor's office, or other health care setting and will not be stored at home. NOTE: This sheet is a summary. It may not cover all possible information. If you have questions about this medicine, talk to your doctor, pharmacist, or health care provider.  2022 [employer] Standard (2018-01-26 00:00:00)  Octreotide injection solution What is this medication? OCTREOTIDE (ok TREE oh tide) is used to reduce blood levels of growth hormone in patients with a condition called acromegaly. This medicine also reduces flushing and watery diarrhea caused by certain types of cancer. This medicine may be used for other purposes; ask your health care provider or pharmacist if you have questions. COMMON  BRAND NAME(S): Leatha Gilding, Sandostatin What should I tell my care team before I take this medication? They need to know if you have any of these conditions: diabetes gallbladder disease kidney disease liver disease thyroid disease an unusual or allergic reaction to octreotide, other medicines, foods, dyes, or preservatives pregnant or trying to get pregnant breast-feeding How should I use this medication? This medicine is for injection under the skin or into a vein (only in emergency situations). It is usually given by a health care professional in a hospital or clinic setting. If you get this medicine at home, you will be taught how to prepare and give this medicine. Allow the injection solution to come to room temperature before use. Do not warm it artificially. Use exactly as directed. Take your medicine at regular intervals. Do not take your medicine more often than directed. It is important that you put your used needles and syringes in a special sharps container. Do not put them in a trash can. If you do not have a sharps container, call your pharmacist or healthcare provider to get one. Talk to your pediatrician regarding the use of this medicine in children. Special care may be needed. Overdosage: If you think you have taken too much of this medicine contact a poison control center or emergency room at once. NOTE: This medicine is only for you. Do not share this medicine with others. What if I  miss a dose? If you miss a dose, take it as soon as you can. If it is almost time for your next dose, take only that dose. Do not take double or extra doses. What may interact with this medication? bromocriptine certain medicines for blood pressure, heart disease, irregular heartbeat cyclosporine diuretics medicines for diabetes, including insulin quinidine This list may not describe all possible interactions. Give your health care provider a list of all the medicines, herbs, non-prescription  drugs, or dietary supplements you use. Also tell them if you smoke, drink alcohol, or use illegal drugs. Some items may interact with your medicine. What should I watch for while using this medication? Visit your doctor or health care professional for regular checks on your progress. To help reduce irritation at the injection site, use a different site for each injection and make sure the solution is at room temperature before use. This medicine may cause decreases in blood sugar. Signs of low blood sugar include chills, cool, pale skin or cold sweats, drowsiness, extreme hunger, fast heartbeat, headache, nausea, nervousness or anxiety, shakiness, trembling, unsteadiness, tiredness, or weakness. Contact your doctor or health care professional right away if you experience any of these symptoms. This medicine may increase blood sugar. Ask your healthcare provider if changes in diet or medicines are needed if you have diabetes. This medicine may cause a decrease in vitamin B12. You should make sure that you get enough vitamin B12 while you are taking this medicine. Discuss the foods you eat and the vitamins you take with your health care professional. What side effects may I notice from receiving this medication? Side effects that you should report to your doctor or health care professional as soon as possible: allergic reactions like skin rash, itching or hives, swelling of the face, lips, or tongue fast, slow, or irregular heartbeat right upper belly pain severe stomach pain signs and symptoms of high blood sugar such as being more thirsty or hungry or having to urinate more than normal. You may also feel very tired or have blurry vision. signs and symptoms of low blood sugar such as feeling anxious; confusion; dizziness; increased hunger; unusually weak or tired; increased sweating; shakiness; cold, clammy skin; irritable; headache; blurred vision; fast heartbeat; loss of consciousness unusually weak  or tired Side effects that usually do not require medical attention (report to your doctor or health care professional if they continue or are bothersome): diarrhea dizziness gas headache nausea, vomiting pain, redness, or irritation at site where injected upset stomach This list may not describe all possible side effects. Call your doctor for medical advice about side effects. You may report side effects to FDA at 1-800-FDA-1088. Where should I keep my medication? Keep out of the reach of children. Store in a refrigerator between 2 and 8 degrees C (36 and 46 degrees F). Protect from light. Allow to come to room temperature naturally. Do not use artificial heat. If protected from light, the injection may be stored at room temperature between 20 and 30 degrees C (70 and 86 degrees F) for 14 days. After the initial use, throw away any unused portion of a multiple dose vial after 14 days. Throw away unused portions of the ampules after use. NOTE: This sheet is a summary. It may not cover all possible information. If you have questions about this medicine, talk to your doctor, pharmacist, or health care provider.  2022 [employer] Standard (2019-04-18 00:00:00)

## 2021-09-23 NOTE — Progress Notes (Signed)
Pt's calcium is 8.8. Ok to proceed with Delton See today per Dr. Burr Medico.

## 2021-09-23 NOTE — Progress Notes (Signed)
[place]   Telephone:(336) [phone] Fax:(336) [phone]   Clinic Follow up Note   Patient Care Team: Leonard Downing, MD as PCP - General (Family Medicine)  Date of Service:  09/23/2021  CHIEF COMPLAINT: f/u of metastatic carcinoid cancer  CURRENT THERAPY:  Observation Xgeva monthly starting 10/17/19. Due to hypocalcemia, reduce to q92months starting 12/13/19, then to q8months starting 04/09/21  ASSESSMENT & PLAN:  Ryan Cowan is a 75 y.o. male with   1. 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 He was first treated with Sandostatin injections since 03/2015. He had disease progression in 06/2018 with bone mets in spine, pelvic bone and sternum.  -He has completed 5 treatments of Lutathera with Dr. Leonia Reeves on 08/21/18-02/05/19.  -DOTATATE PET scan from 03/12/19 shows very good response to Minor treatment but unlikely cured from this alone . -We restarted monthly Sandostatin injections on 05/22/19 through 07/19/19 but symptoms of diarrhea, fatigue and did not improve which is likely related to his persistent high tumor burden. -I started him on third line therapy oral Afinitor 7.5mg  daily beginning 09/17/19. Increased to full dose 10mg  starting 12/11/19. Afinitor has been held since 04/2020 due to LE edema and skin erythema, and did improve off treatment.  -CT CAP on 06/08/21 showed further mild increase in size of mesentery mass, stable liver metastases, and likely postinflammatory RML nodules. -His Chromogranin A tumor marker has also been rising, most recently 1,561 on 06/08/21. Today's result is pending. -he is having increasing diarrhea and weight loss. We again discussed restarting treatment. I recommend starting Afinitor while we arrange for more Lutathera infusions with Dr. Leonia Reeves. I again recommended a DOTATATE PET for further evaluation; we will try to get this done in the next few weeks.    2.  Left Ankle Erythema -He does still have mild skin erythema at left medial ankle on exam today, unchanged from prior. Prior edema has resolved.   3. Bone mets, Mild Hypocalcemia  -first noticed on PET scan in 07/2018 -To reduce his risk of fracture I started him on monthly Xgeva on 10/17/19. Due to hypocalcemia will reduce to q81months starting 12/13/19, tolerated well. We moved to q2months starting 04/09/21 -Calcium normalized on increased oral calcium TID, will continue along with Vit D.    4. 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  -overall mild and stable, hgb 11.6 today (09/23/21)   5. Anxiety/Depression and fatigue  -On Zoloft 100mg  currently, Managed by Dr. Arelia Sneddon  -Mood Stable and improved further off Afinitor. He is more active with more energy and motivation. I encouraged him to be active daily.       Plan -proceed Xgeva today and restart Sandostatin injection  -I prescribed Afinitor for him to start -I called in [place] for his diarrhea  -I will reach out to Dr. Leonia Reeves about Lutathera treatment  -f/u in one month with lab and DOTATATE PET a week before   No problem-specific Assessment & Plan notes found for this encounter.   SUMMARY OF ONCOLOGIC HISTORY: Oncology History Overview Note  Cancer Staging Carcinoid tumor of colon, malignant ([place]) Staging form: Colon and Rectum, AJCC 7th Edition - Clinical: Stage Unknown ([place], N2b, M1) - Unsigned      Carcinoid tumor of colon, malignant ([place])  02/16/2015 Imaging   CT abdomen and pelvis with contrast showed wall thickening of the cecum and terminal ileum high suspicious for cecal neoplasm. Extensive confluent ileal colic adenopathy extending chronically along the superior mesenteric vein, multiple hepatic metastasis   02/18/2015 Initial Diagnosis   Carcinoid  tumor of colon, malignant, with liver metastasis   02/18/2015 Initial Biopsy   Liver biopsy showed metastatic low-grade neuroendocrine tumor (carcinoid). The tumor is positive for CD X2, CD 56, chromogranin, and synaptophysin.   03/05/2015 - 07/24/2018 Chemotherapy   Sandostatin 30 mg IM every 4 weeks. Due to disease progression regular monthly injections stopped after 07/24/18   03/06/2015 Imaging   CT chest showed no evidence of metastasis in the chest. 4.4 cm ascending aortic aneurysm.   09/09/2015 Imaging   Restaging CT abdomen and pelvis with contrast showed interval response of hepatic metastasis. Most are small and, some are stable. Slight interval decrease in mesenteric adenopathy and primary colon tumor.    12/06/2016 Imaging   CT CAP w contrast IMPRESSION: 1. Overall, the extent of metastatic disease to the liver and abdominal/retroperitoneal lymphatics is similar to the prior examination 08/19/2016. 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 2. Multiple small pulmonary nodules appear unchanged in size, number and distribution, favored to be benign. 3. Aortic atherosclerosis, in addition to 2 vessel coronary artery disease. Assessment for potential risk factor modification, dietary therapy or pharmacologic therapy may be warranted, if clinically indicated. 4. In addition, there is dilatation of the aortic root (4.7 cm in diameter at the level of the sinuses of Valsalva) and ectasia of the ascending thoracic aorta measuring 4.4 cm in diameter. Recommend annual imaging followup by CTA or MRA. This recommendation follows 2010 ACCF/AHA/AATS/ACR/ASA/SCA/SCAI/SIR/STS/SVM Guidelines for the Diagnosis and Management of Patients with Thoracic Aortic Disease. Circulation. 2010; 121: J628-B151. 5. Trace volume of ascites. 6. Additional incidental findings, as above.     05/31/2017 Imaging   CT CAP W Contrast  05/31/17 IMPRESSION: 1. Overall the extent of metastatic disease to the liver and abdominal adenopathy is similar to previous exam from 12/06/2016. 2. 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 3. Stable small pulmonary nodules. 4.  Aortic Atherosclerosis (ICD10-I70.0). 5. Dilatation of the ascending thoracic aorta. Unchanged. Recommend annual imaging followup by CTA or MRA. This recommendation follows 2010 ACCF/AHA/AATS/ACR/ASA/SCA/SCAI/SIR/STS/SVM Guidelines for the Diagnosis and Management of Patients with Thoracic Aortic Disease. Circulation. 2010; 121: e266-e369 6. Trace ascites.   12/07/2017 Imaging   CT CAP W Contrast 12/07/17 IMPRESSION: Chest Impression:  1. No evidence of thoracic metastasis. 2. Stable ascending thoracic aortic aneurysm at 4.4 cm. Recommend attention on oncology surveillance.  Abdomen / Pelvis Impression:  1. Mild interval increase in size of multifocal hepatic metastasis. 2. Large central small bowel mesenteric mass is unchanged 3. No bowel obstruction. 4. No skeletal lesions     06/12/2018 Imaging   06/12/2018 CT AP IMPRESSION: Large mesenteric mass in the right mid abdomen, increased.   Multifocal hepatic metastases, increased.   Mild upper abdominal lymphadenopathy, mildly increased.   Masslike thickening involving the terminal ileum, suspicious for metastasis, similar to the prior. Additional nonspecific wall thickening involving multiple loops of small bowel in the pelvis.   Small volume pelvic ascites, mildly increased.   06/12/2018 Progression   IMPRESSION: Large mesenteric mass in the right mid abdomen, increased.   Multifocal hepatic metastases, increased.   Mild upper  abdominal lymphadenopathy, mildly increased.   Masslike thickening involving the terminal ileum, suspicious for metastasis, similar to the prior. Additional nonspecific wall thickening involving multiple  loops of small bowel in the pelvis.   Small volume pelvic ascites, mildly increased.   07/18/2018 PET scan   IMPRESSION: 1. Examination is positive for extensive neuroendocrine tumor receptor avid metastatic disease involving the liver, axial and appendicular skeleton, thoracic and abdominal lymph nodes. There is a dominant mass within the mesentery which is intensely radiotracer avid with a maximum dimension of 11.9 cm and an SUV max of 44.32. Within the distal small bowel loops there is a radiotracer avid lesion likely within the distal ileum. 2. When compared with CT from 06/13/2018 the tumor volume within the abdomen is not significantly changed in the interval. 3.  Aortic Atherosclerosis (ICD10-I70.0). 4. Coronary artery atherosclerotic calcifications 5. Nonobstructing left renal calculi   08/21/2018 - 02/05/2019 Chemotherapy   Lutathera treatment with Sandostatin injection 08/21/18, 10/16/18, 12/11/18, 02/05/19   03/12/2019 PET scan   IMPRESSION: 1. Marked interval positive response to peptide receptor radiotherapy. 2. Significant decrease in size and radiotracer activity within liver metastasis. 3. Significant decrease in size and radiotracer activity of large peritoneal mass in RIGHT lower quadrant. 4. Decrease in size and radiotracer activity of retroperitoneal adenopathy in the upper abdomen. 5. Decrease in size and radiotracer activity or resolution of activity of the small skeletal metastasis.     05/22/2019 - 07/19/2019 Chemotherapy   Monthly Sandostatin injections restarted on 05/22/19-07/19/19. Did not improve symptoms of diarrhea, fatigue and did not improve which is likely related to his persistent high tumor burden.    06/17/2019 Imaging   CT AP W Contast  IMPRESSION: Multifocal hepatic metastases, unchanged from recent PET.   Mesenteric nodal metastasis in the right mid abdomen, unchanged from recent PET.   Stable wall thickening involving the distal ileum.   No  focal osseous metastases are evident on CT.   Additional ancillary findings as above.     09/17/2019 - 04/2020 Chemotherapy   Third line therapy oral Afinitor 7.5mg  daily starting 09/17/19. Increased to full dose 10mg  starting 12/11/19. Afinitor held since 04/2020 due to LE edema and skin erythema. Now on observation   12/12/2019 Imaging   CT AP W Contrast IMPRESSION: 1. Liver metastases are all mildly decreased. 2. Conglomerate nodal mass in the right mesentery is mildly decreased. 3. No new or progressive metastatic disease in the abdomen or pelvis. 4. Chronic wall thickening in the distal and terminal ileum and cecum surrounding the ileocecal valve, unchanged. No evidence of bowel obstruction. 5. Aortic Atherosclerosis (ICD10-I70.0).   12/04/2020 Imaging   CT CAP  IMPRESSION: 1. Slight interval increase in size of a soft tissue mass in the right hemiabdomen mesentery, consistent with worsened mesenteric metastasis. 2. Unchanged thickened, tethered appearance of the cecal base and terminal ileum. 3. Multiple hypodense liver metastases are unchanged. 4. No evidence of metastatic disease in the chest. No evidence of new metastatic disease in the abdomen or pelvis. 5. Unchanged enlargement of the tubular ascending thoracic aorta measuring up to 4.3 x 4.2 cm. Recommend annual imaging followup by CTA or MRA. This recommendation follows 2010 ACCF/AHA/AATS/ACR/ASA/SCA/SCAI/SIR/STS/SVM Guidelines for the Diagnosis and Management of Patients with Thoracic Aortic Disease. Circulation. 2010; 121: K599-J570. Aortic aneurysm NOS (ICD10-I71.9) 6. Coronary artery disease. 7. Unchanged small volume fluid in the low right hemipelvis, likely loculated.   Aortic Atherosclerosis (ICD10-I70.0).     06/08/2021 Imaging   CT CAP  IMPRESSION: 1.  Mild increase in size of soft tissue mass within the small bowel mesentery. 2. No significant change in the size or multiplicity of liver metastases. 3. New  cluster of small nodules within the anterior right middle lobe are likely postinflammatory. Attention on follow-up imaging advised. 4. Small volume of free fluid noted within the pelvis. Increased from previous exam. 5. Aortic atherosclerosis. Coronary artery calcifications noted.      INTERVAL HISTORY:  Ryan Cowan is here for a follow up of metastatic carcinoid cancer. He was last seen by me on 06/25/21. He presents to the clinic accompanied by his sister. He reports he is having diarrhea, with at least 8-10 bowel movements a day (including at night). Because of this, he has lost weight. From his description, it sounds like the diarrhea comes and goes. He also reports mid to low back pain that extends around to his abdomen. He is unsure if it's related to his kidneys or bowels moving or something else.   All other systems were reviewed with the patient and are negative.  MEDICAL HISTORY:  Past Medical History:  Diagnosis Date   Colon cancer ([place]) dx'd 02/2015   Liver metastases ([place]) dx'd 02/2015   Metastatic cancer to bone [facility]) dx'd 2019    SURGICAL HISTORY: Past Surgical History:  Procedure Laterality Date   APPENDECTOMY     KNEE SURGERY     SHOULDER SURGERY      I have reviewed the social history and family history with the patient and they are unchanged from previous note.  ALLERGIES:  is allergic to codeine.  MEDICATIONS:  Current Outpatient Medications  Medication Sig Dispense Refill   Telotristat Ethyl,as Etiprate, (XERMELO) 250 MG TABS Take 250 mg by mouth in the morning, at noon, and at bedtime. 90 tablet 1   calcium-vitamin D (OSCAL WITH D) 500-200 MG-UNIT tablet Take 2 tablets by mouth daily with breakfast.     cyanocobalamin 2000 MCG tablet Take 1,000 mcg by mouth daily.     everolimus (AFINITOR) 10 MG tablet Take 1 tablet (10 mg total) by mouth daily. 30 tablet 0   levothyroxine (SYNTHROID) 100 MCG tablet Take 100 mcg by mouth daily.     Multiple Vitamin  (MULTIVITAMIN WITH MINERALS) TABS tablet Take 1 tablet by mouth daily. Centrum Silver 50 plus     naproxen sodium (ANAPROX) 220 MG tablet Take 220 mg by mouth 2 (two) times daily as needed (pain).     OVER THE COUNTER MEDICATION Place 1 application into both nostrils daily as needed (congestion). Vicks inhaler stick     sertraline (ZOLOFT) 100 MG tablet Take 100 mg by mouth daily.      No current facility-administered medications for this visit.    PHYSICAL EXAMINATION: ECOG PERFORMANCE STATUS: 2 - Symptomatic, <50% confined to bed  Vitals:   09/23/21 1047  BP: (!) 122/50  Pulse: (!) 56  Resp: 18  Temp: 97.7 F (36.5 C)  SpO2: 97%   Wt Readings from Last 3 Encounters:  09/23/21 132 lb 14.4 oz (60.3 kg)  06/25/21 139 lb 8 oz (63.3 kg)  04/09/21 146 lb 4.8 oz (66.4 kg)     GENERAL:alert, no distress and comfortable SKIN: skin color normal, no rashes or significant lesions EYES: normal, Conjunctiva are pink and non-injected, sclera clear  NEURO: alert & oriented x 3 with fluent speech  LABORATORY DATA:  I have reviewed the data as listed CBC Latest Ref Rng & Units 09/23/2021 06/25/2021 06/08/2021  WBC 4.0 -  10.5 K/uL 6.1 4.5 6.1  Hemoglobin 13.0 - 17.0 g/dL 11.6(L) 11.6(L) 12.2(L)  Hematocrit 39.0 - 52.0 % 35.2(L) 34.3(L) 36.0(L)  Platelets 150 - 400 K/uL 252 278 202     CMP Latest Ref Rng & Units 09/23/2021 06/25/2021 06/08/2021  Glucose 70 - 99 mg/dL 78 98 96  BUN 8 - 23 mg/dL 17 18 16   Creatinine 0.61 - 1.24 mg/dL 0.81 0.85 0.89  Sodium 135 - 145 mmol/L 140 141 142  Potassium 3.5 - 5.1 mmol/L 3.9 3.9 4.0  Chloride 98 - 111 mmol/L 105 104 102  CO2 22 - 32 mmol/L 28 28 29   Calcium 8.9 - 10.3 mg/dL 8.8(L) 9.1 9.1  Total Protein 6.5 - 8.1 g/dL 6.3(L) 6.4(L) 6.2(L)  Total Bilirubin 0.3 - 1.2 mg/dL 0.3 <0.2(L) 0.4  Alkaline Phos 38 - 126 U/L 85 76 85  AST 15 - 41 U/L 13(L) 9(L) 14(L)  ALT 0 - 44 U/L 9 8 10       RADIOGRAPHIC STUDIES: I have personally reviewed the  radiological images as listed and agreed with the findings in the report. No results found.    Orders Placed This Encounter  Procedures   NM PET (NETSPOT GA 46 DOTATATE) SKULL BASE TO MID THIGH    Standing Status:   Future    Standing Expiration Date:   09/23/2022    Order Specific Question:   If indicated for the ordered procedure, I authorize the administration of a radiopharmaceutical per Radiology protocol    Answer:   Yes    Order Specific Question:   Preferred imaging location?    Answer:   [facility]   All questions were answered. The patient knows to call the clinic with any problems, questions or concerns. No barriers to learning was detected. The total time spent in the appointment was 30 minutes.     [name], MD 09/23/2021   I, Wilburn Mylar, am acting as scribe for [name], MD.   I have reviewed the above documentation for accuracy and completeness, and I agree with the above.

## 2021-09-24 ENCOUNTER — Other Ambulatory Visit: Payer: Self-pay

## 2021-09-24 ENCOUNTER — Other Ambulatory Visit: Payer: Medicare Other

## 2021-09-24 ENCOUNTER — Other Ambulatory Visit (HOSPITAL_COMMUNITY): Payer: Self-pay

## 2021-09-24 ENCOUNTER — Ambulatory Visit: Payer: Medicare Other | Admitting: Hematology

## 2021-09-24 ENCOUNTER — Ambulatory Visit: Payer: Medicare Other

## 2021-09-24 LAB — CHROMOGRANIN A: Chromogranin A (ng/mL): 2392 ng/mL — ABNORMAL HIGH (ref 0.0–101.8)

## 2021-09-24 NOTE — Telephone Encounter (Signed)
Oral Chemotherapy Pharmacist Encounter   Spoke with patient today to follow up regarding patient's Afinitor (everolimus) and Xermelo (telotristat ethyl).  Discussed with Mr. Schaner the high co-pay of both agents which are unaffordable for patient. We will proceed with applying for manufacturer assistance at this time. He will come to the [facility] on 09/28/21 to sign the paperwork for this as well as bring the required financial documents for enrollment.   Patient knows to call the office with questions or concerns.  Leron Croak, PharmD, BCPS Hematology/Oncology Clinical Pharmacist [facility] [place] [phone] 09/24/2021 10:56 AM

## 2021-09-29 ENCOUNTER — Telehealth: Payer: Self-pay | Admitting: Pharmacist

## 2021-09-29 ENCOUNTER — Other Ambulatory Visit: Payer: Self-pay

## 2021-09-29 MED ORDER — EVEROLIMUS 10 MG PO TABS: 10.0000 mg | ORAL_TABLET | Freq: Every day | ORAL | 0 refills | Status: DC

## 2021-09-29 MED ORDER — XERMELO 250 MG PO TABS: 250.0000 mg | ORAL_TABLET | Freq: Three times a day (TID) | ORAL | 1 refills | Status: AC

## 2021-09-29 NOTE — Telephone Encounter (Signed)
Oral Oncology Pharmacist Encounter  Met with patient while in clinic to complete application for [employer] Patient Assistance Program in an effort to reduce the patient's out of pocket expense for Xermelo to $0.  Application completed and faxed to: [phone]  [employer] patient assistance phone number for follow up is: [phone]  This encounter will be updated until final determination.   Leron Croak, PharmD, BCPS Hematology/Oncology Clinical Pharmacist [facility] [phone] 09/29/2021 1:27 PM

## 2021-09-29 NOTE — Telephone Encounter (Signed)
Oral Oncology Pharmacist Encounter  Met with patient while in clinic to complete application for [employer] Patient Assistance Program in an effort to reduce the patient's out of pocket expense for Afinitor to $0.  Application completed and faxed to: [phone]   [employer] patient assistance phone number for follow up is: [phone]  This encounter will be updated until final determination.   Leron Croak, PharmD, BCPS Hematology/Oncology Clinical Pharmacist [facility] [phone] 09/29/2021 1:27 PM

## 2021-09-30 NOTE — Telephone Encounter (Signed)
Oral Chemotherapy Pharmacist Encounter   Spoke with patient today to follow up regarding patient's oral chemotherapy medication: Afinitor (everolimus)  Patient previously on Afinitor (everolimus) and is restarting for the treatment of metastatic carcinoid tumor, planned duration until disease progression or unacceptable drug toxicity.  Reviewed with patient administration, dosing, side effects, monitoring, drug-food interactions, safe handling, storage, and disposal.  Patient will take Afinitor 10mg  tablets, 1 tablet by mouth once daily, with water, without regard to food. Patient understands to take Afinitor consistently with regards to food and at approximately the same time each day.  Patient knows to avoid grapefruit or grapefruit juice while on therapy with Afinitor.  Afinitor re-start date: 10/01/21  Adverse effects include but are not limited to: mouth sores, GI upset, nausea, diarrhea, constipation, rash, increased blood sugars, decreased blood counts, increased blood pressure, pulmonary toxicity, and edema.   Reviewed with patient importance of keeping a medication schedule and plan for any missed doses. No barriers to medication adherence identified.  Medication reconciliation performed and medication/allergy list updated.  Patient has 17 tablets of Afinitor 10 mg left over from previous shipment. Confirmed expiration date with patient is 04/01/2022. Confirmed with patient that he has stored these in appropriate conditions that did not alter the integrity of the medication. Patient will resume treatment with the leftover medication while awaiting manufacturer assistance re-enrollment approval through [place].   All questions answered.  Mr. Buttery voiced understanding and appreciation.   Medication education handout placed in mail for patient. Patient knows to call the office with questions or concerns. Oral Chemotherapy Clinic phone number provided to  patient.   Leron Croak, PharmD, BCPS Hematology/Oncology Clinical Pharmacist [facility] [place] [phone] 09/30/2021 2:29 PM

## 2021-10-06 NOTE — Telephone Encounter (Signed)
Oral Oncology Pharmacist Encounter  Received notification from [employer] Patient Assistance program that patient has been successfully enrolled into their program to receive Afinitor from the manufacturer at $0 out of pocket until 10/02/22.   Specialty Pharmacy that will dispense medication is [facility] by [employer] ([place] location).  Patient knows to call the office with questions or concerns.   Oral Oncology Clinic will continue to follow.  Leron Croak, PharmD, BCPS Hematology/Oncology Clinical Pharmacist [facility] [place] [phone] 10/06/2021 11:28 AM

## 2021-10-08 NOTE — Telephone Encounter (Signed)
Oral Oncology Pharmacist Encounter   Received notification from [employer] Patient Assistance program that patient has been successfully enrolled into their program to receive Xermelo from the manufacturer at $0 out of pocket until 10/02/22.    Specialty Pharmacy that will dispense medication is [facility] by [employer] ([place] location). Per the case manager I spoke with from [employer], [facility] should be reaching out to patient early next week to set up first shipment of [place]. Patient is also aware to expect call from pharmacy early next week to set up shipment.    Leron Croak, PharmD, BCPS Hematology/Oncology Clinical Pharmacist [facility] [place] [phone] 10/08/2021 10:09 AM

## 2021-10-12 ENCOUNTER — Telehealth: Payer: Self-pay

## 2021-10-12 NOTE — Telephone Encounter (Signed)
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   Pt is doing well. Counseled patient on administration, dosing, side effects, monitoring, drug-food interactions, safe handling, storage, and disposal.  Patient will take Xermelo 250 mg tablets, 1 tablet by mouth three times daily in the morning, at noon, and at bedtime. Patient will take this with food.  Side effects include but not limited to: nausea, headache, and abdominal pain/constipation.    Start date of Xermelo: 10/12/21  Reviewed with patient importance of keeping a medication schedule and plan for any missed doses.  After discussion with patient no patient barriers to medication adherence identified.   Patient was approved to receive Xermelo at no cost through the manufacturer assistance program through [employer] through 10/02/22. Medication will be shipped through [employer] by [employer] ([place] location). This was delivered to patient on 10/11/21.  Ryan Cowan voiced understanding and appreciation. All questions answered. Medication information about the drug has been provided.  Provided patient with [facility] [facility] phone number. Patient knows to call the office with questions or concerns. Oral Chemotherapy Navigation Clinic will continue to follow.  Leron Croak, PharmD, BCPS Hematology/Oncology Clinical Pharmacist [facility] [place] [phone] 10/12/2021 10:32 AM

## 2021-10-12 NOTE — Telephone Encounter (Signed)
Patient is approved for Afinitor at no cost from [employer] 10/08/21-10/02/22  [employer] uses [place] [facility] [place] Phone [phone] Fax [phone] 10/12/2021 5:39 PM

## 2021-10-12 NOTE — Telephone Encounter (Signed)
Oral Oncology Patient Advocate Encounter  Met patient in [place] to complete a re-enrollment application for [employer] [facility] [place] ([employer]) in an effort to reduce the patient's out of pocket expense for Afinitor to $0.    Application completed and faxed to [phone].   [employer] phone number for follow up is [phone].   This encounter will be updated until final determination.   [place] [facility] [place] Phone [phone] Fax [phone] 10/12/2021 5:38 PM

## 2021-10-22 ENCOUNTER — Other Ambulatory Visit: Payer: Self-pay

## 2021-10-22 ENCOUNTER — Encounter (HOSPITAL_COMMUNITY)
Admission: RE | Admit: 2021-10-22 | Discharge: 2021-10-22 | Disposition: A | Discharge status: Home / Self Care | Payer: Medicare Other | Source: Ambulatory Visit | Attending: Hematology | Admitting: Hematology

## 2021-10-22 DIAGNOSIS — C7A029 Malignant carcinoid tumor of the large intestine, unspecified portion: Secondary | ICD-10-CM | POA: Diagnosis present

## 2021-10-22 MED ORDER — GALLIUM GA 68 DOTATATE IV KIT
3.2400 | PACK | Freq: Once | INTRAVENOUS | Status: AC
  Administered 2021-10-22: 3.24 via INTRAVENOUS

## 2021-10-22 NOTE — Progress Notes (Signed)
[facility] called with read on pt's [employer].  Printed report since it was visible in Epic and notified Dr. Burr Medico of the results.  Pt is scheduled follow-up w/Dr. Burr Medico [facility] on 10/25/2021.

## 2021-10-25 ENCOUNTER — Other Ambulatory Visit: Payer: Self-pay

## 2021-10-25 ENCOUNTER — Inpatient Hospital Stay: Payer: Medicare Other | Admitting: Hematology

## 2021-10-25 ENCOUNTER — Inpatient Hospital Stay: Payer: Medicare Other

## 2021-10-25 ENCOUNTER — Inpatient Hospital Stay: Payer: Medicare Other | Attending: Hematology

## 2021-10-25 VITALS — BP 146/70 | HR 70 | Temp 97.8°F | Resp 15 | Wt 138.4 lb

## 2021-10-25 DIAGNOSIS — D509 Iron deficiency anemia, unspecified: Secondary | ICD-10-CM | POA: Diagnosis not present

## 2021-10-25 DIAGNOSIS — R600 Localized edema: Secondary | ICD-10-CM | POA: Diagnosis not present

## 2021-10-25 DIAGNOSIS — C7A029 Malignant carcinoid tumor of the large intestine, unspecified portion: Secondary | ICD-10-CM | POA: Insufficient documentation

## 2021-10-25 DIAGNOSIS — C7951 Secondary malignant neoplasm of bone: Secondary | ICD-10-CM | POA: Diagnosis not present

## 2021-10-25 DIAGNOSIS — F419 Anxiety disorder, unspecified: Secondary | ICD-10-CM | POA: Insufficient documentation

## 2021-10-25 DIAGNOSIS — R5383 Other fatigue: Secondary | ICD-10-CM | POA: Diagnosis not present

## 2021-10-25 DIAGNOSIS — D500 Iron deficiency anemia secondary to blood loss (chronic): Secondary | ICD-10-CM

## 2021-10-25 DIAGNOSIS — Z79899 Other long term (current) drug therapy: Secondary | ICD-10-CM | POA: Diagnosis not present

## 2021-10-25 DIAGNOSIS — C787 Secondary malignant neoplasm of liver and intrahepatic bile duct: Secondary | ICD-10-CM | POA: Diagnosis not present

## 2021-10-25 DIAGNOSIS — F32A Depression, unspecified: Secondary | ICD-10-CM | POA: Insufficient documentation

## 2021-10-25 DIAGNOSIS — C772 Secondary and unspecified malignant neoplasm of intra-abdominal lymph nodes: Secondary | ICD-10-CM | POA: Diagnosis not present

## 2021-10-25 LAB — COMPREHENSIVE METABOLIC PANEL
ALT: 11 U/L (ref 0–44)
AST: 18 U/L (ref 15–41)
Albumin: 3.3 g/dL — ABNORMAL LOW (ref 3.5–5.0)
Alkaline Phosphatase: 84 U/L (ref 38–126)
Anion gap: 8 (ref 5–15)
BUN: 16 mg/dL (ref 8–23)
CO2: 27 mmol/L (ref 22–32)
Calcium: 8.6 mg/dL — ABNORMAL LOW (ref 8.9–10.3)
Chloride: 107 mmol/L (ref 98–111)
Creatinine, Ser: 0.78 mg/dL (ref 0.61–1.24)
GFR, Estimated: >60 mL/min (ref >60)
Glucose, Bld: 91 mg/dL (ref 70–99)
Potassium: 3.3 mmol/L — ABNORMAL LOW (ref 3.5–5.1)
Sodium: 142 mmol/L (ref 135–145)
Total Bilirubin: 0.2 mg/dL — ABNORMAL LOW (ref 0.3–1.2)
Total Protein: 6.5 g/dL (ref 6.5–8.1)

## 2021-10-25 LAB — CBC WITH DIFFERENTIAL/PLATELET
Abs Immature Granulocytes: 0.02 10*3/uL (ref 0.00–0.07)
Basophils Absolute: 0 10*3/uL (ref 0.0–0.1)
Basophils Relative: 1 %
Eosinophils Absolute: 0.1 10*3/uL (ref 0.0–0.5)
Eosinophils Relative: 2 %
HCT: 30.5 % — ABNORMAL LOW (ref 39.0–52.0)
Hemoglobin: 10.2 g/dL — ABNORMAL LOW (ref 13.0–17.0)
Immature Granulocytes: 0 %
Lymphocytes Relative: 8 %
Lymphs Abs: 0.5 10*3/uL — ABNORMAL LOW (ref 0.7–4.0)
MCH: 31.5 pg (ref 26.0–34.0)
MCHC: 33.4 g/dL (ref 30.0–36.0)
MCV: 94.1 fL (ref 80.0–100.0)
Monocytes Absolute: 0.8 10*3/uL (ref 0.1–1.0)
Monocytes Relative: 15 %
Neutro Abs: 4.1 10*3/uL (ref 1.7–7.7)
Neutrophils Relative %: 74 %
Platelets: 258 10*3/uL (ref 150–400)
RBC: 3.24 MIL/uL — ABNORMAL LOW (ref 4.22–5.81)
RDW: 12.7 % (ref 11.5–15.5)
WBC: 5.6 10*3/uL (ref 4.0–10.5)
nRBC: 0 % (ref 0.0–0.2)

## 2021-10-25 MED ORDER — EVEROLIMUS 10 MG PO TABS: 10.0000 mg | ORAL_TABLET | Freq: Every day | ORAL | 1 refills | Status: DC

## 2021-10-25 MED ORDER — OCTREOTIDE ACETATE 20 MG IM KIT
20.0000 mg | PACK | Freq: Once | INTRAMUSCULAR | Status: AC
  Administered 2021-10-25: 20 mg via INTRAMUSCULAR
  Filled 2021-10-25: qty 1

## 2021-10-25 MED ORDER — DENOSUMAB 120 MG/1.7ML ~~LOC~~ SOLN: 120.0000 mg | Freq: Once | SUBCUTANEOUS | Status: DC

## 2021-10-25 NOTE — Progress Notes (Signed)
Ryan   [phone]) [phone] [phone]   Clinic Follow up Note   Patient Care Team: Leonard Downing, MD as PCP - General (Family Medicine)  Date of Service:  10/25/2021  CHIEF COMPLAINT: f/u of metastatic carcinoid cancer  CURRENT THERAPY:  -Xgeva starting 10/17/19, currently q49months -Afinitor 10 mg daily, restarted 09/23/21 -Sandostatin injections q4weeks, restarted 09/23/21  ASSESSMENT & PLAN:  Ryan Cowan is a 77 y.o. male with   1. 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 He was first treated with Sandostatin injections since 03/2015. He had disease progression in 06/2018 with bone mets in spine, pelvic bone and sternum.  -He has completed 5 treatments of Lutathera with Dr. Leonia Reeves on 08/21/18-02/05/19.  -DOTATATE PET scan from 03/12/19 shows very good response to Napavine treatment but unlikely cured from this alone . -he received Sandostatin injections 8/19-10/16/20. He experienced diarrhea, fatigue, but these did not improve off treatment, indicating these were likely related to his persistent high tumor burden. -I started him on third line therapy oral Afinitor 7.5mg  daily beginning 09/17/19. Increased to full dose 10mg  starting 12/11/19. Afinitor has been held since 04/2020 due to LE edema and skin erythema, which did improve off treatment.  -CT CAP on 06/08/21 showed further mild increase in size of mesentery mass, stable liver metastases, and likely postinflammatory RML nodules. -His Chromogranin A tumor marker has also been rising, most recently 2,392 on 09/23/21. Given this and his increase in diarrhea and weight loss, he restarted Afinitor and Sandostatin injections on 09/23/21. -DOTATATE PET on 10/22/21 showed: mild progression of NET; one new right liver lesion; new finding within T10 to L1; one new, small peritoneal metastasis in deep right pelvis; new large volume  ascites/intraperitoneal fluid. I reviewed the results with them today. He is not very symptomatic from ascites, will hold on paracentesis now. He overall has disease progression  -I will reach out to Dr. Leonia Reeves about additional Lutathera treatments. -He restarted Afinitor last month, but has not had refill yet, I gave him the pharmacy number for him to call    3. Bone mets, Mild Hypocalcemia  -first noticed on PET scan in 07/2018 -To reduce his risk of fracture I started him on monthly Xgeva on 10/17/19. Due to hypocalcemia will reduce to q31months starting 12/13/19, tolerated well. We moved to q48months starting 04/09/21 -Calcium normalized on increased oral calcium TID, will continue along with Vit D.    4. 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  -worsened some since restarting treatment, hgb 10.2 today (10/25/21)   5. Anxiety/Depression and fatigue  -On Zoloft 100mg  currently, Managed by Dr. Arelia Sneddon  -mood previously worsened on Afinitor. Will monitor.     Plan -proceed with Sandostatin injection  -continue Afinitor, he will call pharmacy to get refill shipped  -I will reach out to Dr. Leonia Reeves about Ryan Cowan re-treatment  -lab and f/u in one month -next Xgeva in 2 months    No problem-specific Assessment & Plan notes found for this encounter.   SUMMARY OF ONCOLOGIC HISTORY: Oncology History Overview Note  Cancer Staging Carcinoid tumor of colon, malignant ([place]) Staging form: Colon and Rectum, AJCC 7th Edition - Clinical: Stage Unknown ([place], N2b, M1) - Unsigned      Carcinoid tumor of  colon, malignant ([place])  02/16/2015 Imaging   CT abdomen and pelvis with contrast showed wall thickening of the cecum and terminal ileum high suspicious for cecal neoplasm. Extensive confluent ileal colic adenopathy extending  chronically along the superior mesenteric vein, multiple hepatic metastasis   02/18/2015 Initial Diagnosis   Carcinoid tumor of colon, malignant, with liver metastasis   02/18/2015 Initial Biopsy   Liver biopsy showed metastatic low-grade neuroendocrine tumor (carcinoid). The tumor is positive for CD X2, CD 56, chromogranin, and synaptophysin.   03/05/2015 - 07/24/2018 Chemotherapy   Sandostatin 30 mg IM every 4 weeks. Due to disease progression regular monthly injections stopped after 07/24/18   03/06/2015 Imaging   CT chest showed no evidence of metastasis in the chest. 4.4 cm ascending aortic aneurysm.   09/09/2015 Imaging   Restaging CT abdomen and pelvis with contrast showed interval response of hepatic metastasis. Most are small and, some are stable. Slight interval decrease in mesenteric adenopathy and primary colon tumor.    12/06/2016 Imaging   CT CAP w contrast IMPRESSION: 1. Overall, the extent of metastatic disease to the liver and abdominal/retroperitoneal lymphatics is similar to the prior examination 08/19/2016. 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 2. Multiple small pulmonary nodules appear unchanged in size, number and distribution, favored to be benign. 3. Aortic atherosclerosis, in addition to 2 vessel coronary artery disease. Assessment for potential risk factor modification, dietary therapy or pharmacologic therapy may be warranted, if clinically indicated. 4. In addition, there is dilatation of the aortic root (4.7 cm in diameter at the level of the sinuses of Valsalva) and ectasia of the ascending thoracic aorta measuring 4.4 cm in diameter. Recommend annual imaging followup by CTA or MRA. This recommendation follows 2010 ACCF/AHA/AATS/ACR/ASA/SCA/SCAI/SIR/STS/SVM Guidelines for the Diagnosis and Management of Patients with Thoracic Aortic Disease. Circulation. 2010; 121:  W102-V253. 5. Trace volume of ascites. 6. Additional incidental findings, as above.     05/31/2017 Imaging   CT CAP W Contrast 05/31/17 IMPRESSION: 1. Overall the extent of metastatic disease to the liver and abdominal adenopathy is similar to previous exam from 12/06/2016. 2. 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 3. Stable small pulmonary nodules. 4.  Aortic Atherosclerosis (ICD10-I70.0). 5. Dilatation of the ascending thoracic aorta. Unchanged. Recommend annual imaging followup by CTA or MRA. This recommendation follows 2010 ACCF/AHA/AATS/ACR/ASA/SCA/SCAI/SIR/STS/SVM Guidelines for the Diagnosis and Management of Patients with Thoracic Aortic Disease. Circulation. 2010; 121: e266-e369 6. Trace ascites.   12/07/2017 Imaging   CT CAP W Contrast 12/07/17 IMPRESSION: Chest Impression:  1. No evidence of thoracic metastasis. 2. Stable ascending thoracic aortic aneurysm at 4.4 cm. Recommend attention on oncology surveillance.  Abdomen / Pelvis Impression:  1. Mild interval increase in size of multifocal hepatic metastasis. 2. Large central small bowel mesenteric mass is unchanged 3. No bowel obstruction. 4. No skeletal lesions     06/12/2018 Imaging   06/12/2018 CT AP IMPRESSION: Large mesenteric mass in the right mid abdomen, increased.   Multifocal hepatic metastases, increased.   Mild upper abdominal lymphadenopathy, mildly increased.   Masslike thickening involving the terminal ileum, suspicious for metastasis, similar to the prior. Additional nonspecific wall thickening involving multiple loops of small bowel in the pelvis.   Small volume pelvic ascites, mildly increased.   06/12/2018 Progression   IMPRESSION: Large mesenteric mass in the right mid abdomen, increased.   Multifocal hepatic metastases, increased.  Mild upper abdominal lymphadenopathy, mildly increased.   Masslike thickening involving  the terminal ileum, suspicious for metastasis, similar to the prior. Additional nonspecific wall thickening involving multiple loops of small bowel in the pelvis.   Small volume pelvic ascites, mildly increased.   07/18/2018 PET scan   IMPRESSION: 1. Examination is positive for extensive neuroendocrine tumor receptor avid metastatic disease involving the liver, axial and appendicular skeleton, thoracic and abdominal lymph nodes. There is a dominant mass within the mesentery which is intensely radiotracer avid with a maximum dimension of 11.9 cm and an SUV max of 44.32. Within the distal small bowel loops there is a radiotracer avid lesion likely within the distal ileum. 2. When compared with CT from 06/13/2018 the tumor volume within the abdomen is not significantly changed in the interval. 3.  Aortic Atherosclerosis (ICD10-I70.0). 4. Coronary artery atherosclerotic calcifications 5. Nonobstructing left renal calculi   08/21/2018 - 02/05/2019 Chemotherapy   Lutathera treatment with Sandostatin injection 08/21/18, 10/16/18, 12/11/18, 02/05/19   03/12/2019 PET scan   IMPRESSION: 1. Marked interval positive response to peptide receptor radiotherapy. 2. Significant decrease in size and radiotracer activity within liver metastasis. 3. Significant decrease in size and radiotracer activity of large peritoneal mass in RIGHT lower quadrant. 4. Decrease in size and radiotracer activity of retroperitoneal adenopathy in the upper abdomen. 5. Decrease in size and radiotracer activity or resolution of activity of the small skeletal metastasis.     05/22/2019 - 07/19/2019 Chemotherapy   Monthly Sandostatin injections restarted on 05/22/19-07/19/19. Did not improve symptoms of diarrhea, fatigue and did not improve which is likely related to his persistent high tumor burden.    06/17/2019 Imaging   CT AP W Contast  IMPRESSION: Multifocal hepatic metastases, unchanged from recent PET.   Mesenteric nodal  metastasis in the right mid abdomen, unchanged from recent PET.   Stable wall thickening involving the distal ileum.   No focal osseous metastases are evident on CT.   Additional ancillary findings as above.     09/17/2019 - 04/2020 Chemotherapy   Third line therapy oral Afinitor 7.5mg  daily starting 09/17/19. Increased to full dose 10mg  starting 12/11/19. Afinitor held since 04/2020 due to LE edema and skin erythema. Now on observation   12/12/2019 Imaging   CT AP W Contrast IMPRESSION: 1. Liver metastases are all mildly decreased. 2. Conglomerate nodal mass in the right mesentery is mildly decreased. 3. No new or progressive metastatic disease in the abdomen or pelvis. 4. Chronic wall thickening in the distal and terminal ileum and cecum surrounding the ileocecal valve, unchanged. No evidence of bowel obstruction. 5. Aortic Atherosclerosis (ICD10-I70.0).   12/04/2020 Imaging   CT CAP  IMPRESSION: 1. Slight interval increase in size of a soft tissue mass in the right hemiabdomen mesentery, consistent with worsened mesenteric metastasis. 2. Unchanged thickened, tethered appearance of the cecal base and terminal ileum. 3. Multiple hypodense liver metastases are unchanged. 4. No evidence of metastatic disease in the chest. No evidence of new metastatic disease in the abdomen or pelvis. 5. Unchanged enlargement of the tubular ascending thoracic aorta measuring up to 4.3 x 4.2 cm. Recommend annual imaging followup by CTA or MRA. This recommendation follows 2010 ACCF/AHA/AATS/ACR/ASA/SCA/SCAI/SIR/STS/SVM Guidelines for the Diagnosis and Management of Patients with Thoracic Aortic Disease. Circulation. 2010; 121: X528-U132. Aortic aneurysm NOS (ICD10-I71.9) 6. Coronary artery disease. 7. Unchanged small volume fluid in the low right hemipelvis, likely loculated.   Aortic Atherosclerosis (ICD10-I70.0).     06/08/2021 Imaging   CT CAP  IMPRESSION: 1. Mild increase in size of soft  tissue mass within the small bowel mesentery. 2. No significant change in the size or multiplicity of liver metastases. 3. New cluster of small nodules within the anterior right middle lobe are likely postinflammatory. Attention on follow-up imaging advised. 4. Small volume of free fluid noted within the pelvis. Increased from previous exam. 5. Aortic atherosclerosis. Coronary artery calcifications noted.   10/22/2021 PET scan   NETSPOT GA 68 DOTATATE  IMPRESSION: 1. Mild progression neuroendocrine tumor. Several lesions in the liver continue to decrease radiotracer activity; however, there is one clearly new metastatic lesion in the RIGHT hepatic lobe. 2. A concerning new finding is linear radiotracer activity within the central canal from T 10 to L1 consistent with new metastasis. Consider MRI of the thoracolumbar spine with contrast to evaluate for metastatic disease in the central canal. 3. Large mesenteric central nodule with decreased radiotracer activity and mild increased in size compared to PET-CT scan June 2020. 4. Single new small peritoneal metastasis in the deep RIGHT pelvis. 5. Scattered skeletal metastasis are decreased radiotracer activity. 6. NEW large volume ascites/intraperitoneal free fluid of unclear clear etiology. Large volume intraperitoneal free fluid is new from CT scan 06/08/2021. There is submucosal edema within the small bowel of the lower abdomen favored passive congestion. No evidence of bowel obstruction.      INTERVAL HISTORY:  Ryan Cowan is here for a follow up of metastatic carcinoid cancer. He was last seen by me on 09/23/21. He presents to the clinic accompanied by his wife.   All other systems were reviewed with the patient and are negative.  MEDICAL HISTORY:  Past Medical History:  Diagnosis Date   Colon cancer ([place]) dx'd 02/2015   Liver metastases ([place]) dx'd 02/2015   Metastatic cancer to bone [facility]) dx'd 2019    SURGICAL  HISTORY: Past Surgical History:  Procedure Laterality Date   APPENDECTOMY     KNEE SURGERY     SHOULDER SURGERY      I have reviewed the social history and family history with the patient and they are unchanged from previous note.  ALLERGIES:  is allergic to codeine.  MEDICATIONS:  Current Outpatient Medications  Medication Sig Dispense Refill   calcium-vitamin D (OSCAL WITH D) 500-200 MG-UNIT tablet Take 2 tablets by mouth daily with breakfast.     cyanocobalamin 2000 MCG tablet Take 1,000 mcg by mouth daily.     everolimus (AFINITOR) 10 MG tablet Take 1 tablet (10 mg total) by mouth daily. 30 tablet 1   levothyroxine (SYNTHROID) 100 MCG tablet Take 100 mcg by mouth daily.     Multiple Vitamin (MULTIVITAMIN WITH MINERALS) TABS tablet Take 1 tablet by mouth daily. Centrum Silver 50 plus     naproxen sodium (ANAPROX) 220 MG tablet Take 220 mg by mouth 2 (two) times daily as needed (pain).     OVER THE COUNTER MEDICATION Place 1 application into both nostrils daily as needed (congestion). Vicks inhaler stick     sertraline (ZOLOFT) 100 MG tablet Take 100 mg by mouth daily.      Telotristat Ethyl,as Etiprate, (XERMELO) 250 MG TABS Take 250 mg by mouth in the morning, at noon, and at bedtime. 90 tablet 1   No current facility-administered medications for this visit.    PHYSICAL EXAMINATION: ECOG PERFORMANCE STATUS: 1 - Symptomatic but completely ambulatory  Vitals:   10/25/21 1420  BP: (!) 146/70  Pulse: 70  Resp: 15  Temp: 97.8 F (  36.6 C)  SpO2: 98%   Wt Readings from Last 3 Encounters:  10/25/21 138 lb 6.4 oz (62.8 kg)  09/23/21 132 lb 14.4 oz (60.3 kg)  06/25/21 139 lb 8 oz (63.3 kg)     GENERAL:alert, no distress and comfortable SKIN: skin color normal, no rashes or significant lesions EYES: normal, Conjunctiva are pink and non-injected, sclera clear  NEURO: alert & oriented x 3 with fluent speech  LABORATORY DATA:  I have reviewed the data as listed CBC Latest  Ref Rng & Units 10/25/2021 09/23/2021 06/25/2021  WBC 4.0 - 10.5 K/uL 5.6 6.1 4.5  Hemoglobin 13.0 - 17.0 g/dL 10.2(L) 11.6(L) 11.6(L)  Hematocrit 39.0 - 52.0 % 30.5(L) 35.2(L) 34.3(L)  Platelets 150 - 400 K/uL 258 252 278     CMP Latest Ref Rng & Units 10/25/2021 09/23/2021 06/25/2021  Glucose 70 - 99 mg/dL 91 78 98  BUN 8 - 23 mg/dL 16 17 18   Creatinine 0.61 - 1.24 mg/dL 0.78 0.81 0.85  Sodium 135 - 145 mmol/L 142 140 141  Potassium 3.5 - 5.1 mmol/L 3.3(L) 3.9 3.9  Chloride 98 - 111 mmol/L 107 105 104  CO2 22 - 32 mmol/L 27 28 28   Calcium 8.9 - 10.3 mg/dL 8.6(L) 8.8(L) 9.1  Total Protein 6.5 - 8.1 g/dL 6.5 6.3(L) 6.4(L)  Total Bilirubin 0.3 - 1.2 mg/dL 0.2(L) 0.3 <0.2(L)  Alkaline Phos 38 - 126 U/L 84 85 76  AST 15 - 41 U/L 18 13(L) 9(L)  ALT 0 - 44 U/L 11 9 8       RADIOGRAPHIC STUDIES: I have personally reviewed the radiological images as listed and agreed with the findings in the report. No results found.    No orders of the defined types were placed in this encounter.  All questions were answered. The patient knows to call the clinic with any problems, questions or concerns. No barriers to learning was detected. The total time spent in the appointment was 30 minutes.     [name], MD 10/25/2021   I, Wilburn Mylar, am acting as scribe for [name], MD.   I have reviewed the above documentation for accuracy and completeness, and I agree with the above.

## 2021-10-26 ENCOUNTER — Encounter: Payer: Self-pay | Admitting: Hematology

## 2021-10-27 ENCOUNTER — Other Ambulatory Visit: Payer: Self-pay

## 2021-10-29 ENCOUNTER — Ambulatory Visit: Payer: Medicare Other | Admitting: Hematology

## 2021-11-22 ENCOUNTER — Other Ambulatory Visit: Payer: Self-pay

## 2021-11-22 ENCOUNTER — Inpatient Hospital Stay: Payer: Medicare Other | Attending: Hematology

## 2021-11-22 ENCOUNTER — Inpatient Hospital Stay: Payer: Medicare Other

## 2021-11-22 ENCOUNTER — Inpatient Hospital Stay: Payer: Medicare Other | Admitting: Hematology

## 2021-11-22 ENCOUNTER — Encounter: Payer: Self-pay | Admitting: Hematology

## 2021-11-22 VITALS — BP 141/66 | HR 73 | Temp 97.7°F | Resp 18 | Ht 73.0 in | Wt 143.6 lb

## 2021-11-22 DIAGNOSIS — C7A029 Malignant carcinoid tumor of the large intestine, unspecified portion: Secondary | ICD-10-CM | POA: Diagnosis not present

## 2021-11-22 DIAGNOSIS — R600 Localized edema: Secondary | ICD-10-CM | POA: Diagnosis not present

## 2021-11-22 DIAGNOSIS — C787 Secondary malignant neoplasm of liver and intrahepatic bile duct: Secondary | ICD-10-CM | POA: Diagnosis not present

## 2021-11-22 DIAGNOSIS — F419 Anxiety disorder, unspecified: Secondary | ICD-10-CM | POA: Diagnosis not present

## 2021-11-22 DIAGNOSIS — D509 Iron deficiency anemia, unspecified: Secondary | ICD-10-CM | POA: Insufficient documentation

## 2021-11-22 DIAGNOSIS — Z79899 Other long term (current) drug therapy: Secondary | ICD-10-CM | POA: Insufficient documentation

## 2021-11-22 DIAGNOSIS — C7951 Secondary malignant neoplasm of bone: Secondary | ICD-10-CM | POA: Diagnosis not present

## 2021-11-22 DIAGNOSIS — F32A Depression, unspecified: Secondary | ICD-10-CM | POA: Diagnosis not present

## 2021-11-22 DIAGNOSIS — R5383 Other fatigue: Secondary | ICD-10-CM | POA: Diagnosis not present

## 2021-11-22 DIAGNOSIS — C772 Secondary and unspecified malignant neoplasm of intra-abdominal lymph nodes: Secondary | ICD-10-CM | POA: Insufficient documentation

## 2021-11-22 DIAGNOSIS — D500 Iron deficiency anemia secondary to blood loss (chronic): Secondary | ICD-10-CM

## 2021-11-22 LAB — COMPREHENSIVE METABOLIC PANEL
ALT: 9 U/L (ref 0–44)
AST: 18 U/L (ref 15–41)
Albumin: 3.2 g/dL — ABNORMAL LOW (ref 3.5–5.0)
Alkaline Phosphatase: 97 U/L (ref 38–126)
Anion gap: 8 (ref 5–15)
BUN: 21 mg/dL (ref 8–23)
CO2: 28 mmol/L (ref 22–32)
Calcium: 8.6 mg/dL — ABNORMAL LOW (ref 8.9–10.3)
Chloride: 105 mmol/L (ref 98–111)
Creatinine, Ser: 0.83 mg/dL (ref 0.61–1.24)
GFR, Estimated: >60 mL/min (ref >60)
Glucose, Bld: 81 mg/dL (ref 70–99)
Potassium: 3.7 mmol/L (ref 3.5–5.1)
Sodium: 141 mmol/L (ref 135–145)
Total Bilirubin: 0.3 mg/dL (ref 0.3–1.2)
Total Protein: 6.5 g/dL (ref 6.5–8.1)

## 2021-11-22 LAB — CBC WITH DIFFERENTIAL/PLATELET
Abs Immature Granulocytes: 0.01 10*3/uL (ref 0.00–0.07)
Basophils Absolute: 0 10*3/uL (ref 0.0–0.1)
Basophils Relative: 1 %
Eosinophils Absolute: 0.2 10*3/uL (ref 0.0–0.5)
Eosinophils Relative: 3 %
HCT: 33.2 % — ABNORMAL LOW (ref 39.0–52.0)
Hemoglobin: 10.8 g/dL — ABNORMAL LOW (ref 13.0–17.0)
Immature Granulocytes: 0 %
Lymphocytes Relative: 10 %
Lymphs Abs: 0.5 10*3/uL — ABNORMAL LOW (ref 0.7–4.0)
MCH: 30.2 pg (ref 26.0–34.0)
MCHC: 32.5 g/dL (ref 30.0–36.0)
MCV: 92.7 fL (ref 80.0–100.0)
Monocytes Absolute: 0.8 10*3/uL (ref 0.1–1.0)
Monocytes Relative: 16 %
Neutro Abs: 3.4 10*3/uL (ref 1.7–7.7)
Neutrophils Relative %: 70 %
Platelets: 287 10*3/uL (ref 150–400)
RBC: 3.58 MIL/uL — ABNORMAL LOW (ref 4.22–5.81)
RDW: 13.5 % (ref 11.5–15.5)
WBC: 4.9 10*3/uL (ref 4.0–10.5)
nRBC: 0 % (ref 0.0–0.2)

## 2021-11-22 MED ORDER — OCTREOTIDE ACETATE 30 MG IM KIT
30.0000 mg | PACK | Freq: Once | INTRAMUSCULAR | Status: AC
  Administered 2021-11-22: 30 mg via INTRAMUSCULAR
  Filled 2021-11-22: qty 1

## 2021-11-22 MED ORDER — POTASSIUM CHLORIDE CRYS ER 20 MEQ PO TBCR: 20.0000 meq | EXTENDED_RELEASE_TABLET | Freq: Every day | ORAL | 0 refills | Status: DC

## 2021-11-22 MED ORDER — FUROSEMIDE 20 MG PO TABS: 20.0000 mg | ORAL_TABLET | Freq: Every day | ORAL | 0 refills | Status: DC | PRN

## 2021-11-22 NOTE — Progress Notes (Signed)
[place]   [phone] [phone]) [phone]   Clinic Follow up Note   Patient Care Team: Leonard Downing, MD as PCP - General (Family Medicine)  Date of Service:  11/22/2021  CHIEF COMPLAINT: f/u of metastatic carcinoid tumor  CURRENT THERAPY:  -Xgeva starting 10/17/19, currently q32months -Afinitor 10 mg daily, restarted 09/23/21 -Sandostatin injections q4weeks, restarted 09/23/21  ASSESSMENT & PLAN:  Ryan Cowan is a 76 y.o. male with   1. 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 He was first treated with Sandostatin injections since 03/2015. He had disease progression in 06/2018 with bone mets in spine, pelvic bone and sternum.  -He has completed 5 treatments of Lutathera with Dr. Leonia Reeves on 08/21/18-02/05/19.  -DOTATATE PET scan from 03/12/19 shows very good response to [place] treatment but unlikely cured from this alone . -he received Sandostatin injections 8/19-10/16/20. He experienced diarrhea, fatigue, but these did not improve off treatment, indicating these were likely related to his persistent high tumor burden. -I started him on third line therapy oral Afinitor 7.5mg  daily beginning 09/17/19. Increased to full dose 10mg  starting 12/11/19. Afinitor has been held since 04/2020 due to LE edema and skin erythema, which did improve off treatment.  -CT CAP on 06/08/21 showed further mild increase in size of mesentery mass, stable liver metastases, and likely postinflammatory RML nodules. -His Chromogranin A tumor marker has also been rising, most recently 2,392 on 09/23/21. Given this and his increase in diarrhea and weight loss, he restarted Afinitor and Sandostatin injections on 09/23/21. -DOTATATE PET on 10/22/21 showed: mild progression of NET; one new right liver lesion; new finding within T10 to L1; one new, small peritoneal metastasis in deep right pelvis; new large volume  ascites/intraperitoneal fluid.  -he has developed worsening abdominal distention and leg edema, I will schedule a paracentesis for him. -I will reach out to Dr. Leonia Reeves about additional Lutathera treatments. -labs reviewed, overall stable. We will continue Sandostatin and Afinitor.  2. Ascites/intraperitoneal fluid -secondary to #1, seen on DOTATATE PET on 10/22/21 -he reports bloating now. He has some tenderness on exam today. I will order paracentesis. -I will also start him on lasix. Because this can cause low potassium, I also called in KCL.   3. Bone mets, Mild Hypocalcemia  -first noticed on PET scan in 07/2018 -To reduce his risk of fracture I started him on monthly Xgeva on 10/17/19. Due to hypocalcemia will reduce to q4months starting 12/13/19. We moved to q28months starting 04/09/21 -Calcium decreased on treatment, 8.6 today (11/22/21)   4. 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  -worsened some since restarting treatment, stable today, hgb 10.8 (11/22/21)   5. Anxiety/Depression and fatigue  -On Zoloft 100mg  currently, Managed by Dr. Arelia Sneddon  -mood previously worsened on Afinitor. Will monitor.     Plan -proceed with Sandostatin injection  -continue Afinitor  -I called in lasix and KCL, repeat lab next week  -paracentesis with labs to be done next week -nutrition referral -I will reach out to Dr. Leonia Reeves about Ephriam Knuckles re-treatment  -lab, f/u, sandostatin injection, and Xgeva in one month   No problem-specific Assessment & Plan notes found for this encounter.   SUMMARY OF ONCOLOGIC HISTORY: Oncology History Overview Note  Cancer Staging Carcinoid tumor of colon, malignant (  [place]) Staging form: Colon and Rectum, AJCC 7th Edition - Clinical: Stage Unknown ([place], N2b, M1) - Unsigned      Carcinoid tumor of  colon, malignant ([place])  02/16/2015 Imaging   CT abdomen and pelvis with contrast showed wall thickening of the cecum and terminal ileum high suspicious for cecal neoplasm. Extensive confluent ileal colic adenopathy extending chronically along the superior mesenteric vein, multiple hepatic metastasis   02/18/2015 Initial Diagnosis   Carcinoid tumor of colon, malignant, with liver metastasis   02/18/2015 Initial Biopsy   Liver biopsy showed metastatic low-grade neuroendocrine tumor (carcinoid). The tumor is positive for CD X2, CD 56, chromogranin, and synaptophysin.   03/05/2015 - 07/24/2018 Chemotherapy   Sandostatin 30 mg IM every 4 weeks. Due to disease progression regular monthly injections stopped after 07/24/18   03/06/2015 Imaging   CT chest showed no evidence of metastasis in the chest. 4.4 cm ascending aortic aneurysm.   09/09/2015 Imaging   Restaging CT abdomen and pelvis with contrast showed interval response of hepatic metastasis. Most are small and, some are stable. Slight interval decrease in mesenteric adenopathy and primary colon tumor.    12/06/2016 Imaging   CT CAP w contrast IMPRESSION: 1. Overall, the extent of metastatic disease to the liver and abdominal/retroperitoneal lymphatics is similar to the prior examination 08/19/2016. 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 2. Multiple small pulmonary nodules appear unchanged in size, number and distribution, favored to be benign. 3. Aortic atherosclerosis, in addition to 2 vessel coronary artery disease. Assessment for potential risk factor modification, dietary therapy or pharmacologic therapy may be warranted, if clinically indicated. 4. In addition, there is dilatation of the aortic root (4.7 cm in diameter at the level of the sinuses of Valsalva) and ectasia of the ascending thoracic aorta measuring 4.4 cm in diameter. Recommend annual  imaging followup by CTA or MRA. This recommendation follows 2010 ACCF/AHA/AATS/ACR/ASA/SCA/SCAI/SIR/STS/SVM Guidelines for the Diagnosis and Management of Patients with Thoracic Aortic Disease. Circulation. 2010; 121: E563-J497. 5. Trace volume of ascites. 6. Additional incidental findings, as above.     05/31/2017 Imaging   CT CAP W Contrast 05/31/17 IMPRESSION: 1. Overall the extent of metastatic disease to the liver and abdominal adenopathy is similar to previous exam from 12/06/2016. 2. 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 3. Stable small pulmonary nodules. 4.  Aortic Atherosclerosis (ICD10-I70.0). 5. Dilatation of the ascending thoracic aorta. Unchanged. Recommend annual imaging followup by CTA or MRA. This recommendation follows 2010 ACCF/AHA/AATS/ACR/ASA/SCA/SCAI/SIR/STS/SVM Guidelines for the Diagnosis and Management of Patients with Thoracic Aortic Disease. Circulation. 2010; 121: e266-e369 6. Trace ascites.   12/07/2017 Imaging   CT CAP W Contrast 12/07/17 IMPRESSION: Chest Impression:  1. No evidence of thoracic metastasis. 2. Stable ascending thoracic aortic aneurysm at 4.4 cm. Recommend attention on oncology surveillance.  Abdomen / Pelvis Impression:  1. Mild interval increase in size of multifocal hepatic metastasis. 2. Large central small bowel mesenteric mass is unchanged 3. No bowel obstruction. 4. No skeletal lesions     06/12/2018 Imaging   06/12/2018 CT AP IMPRESSION: Large mesenteric mass in the right mid abdomen, increased.   Multifocal hepatic metastases, increased.   Mild upper abdominal lymphadenopathy, mildly increased.   Masslike thickening involving the terminal ileum, suspicious for metastasis, similar to the prior. Additional nonspecific wall thickening involving multiple loops of small bowel in the pelvis.   Small volume  pelvic ascites, mildly increased.   06/12/2018  Progression   IMPRESSION: Large mesenteric mass in the right mid abdomen, increased.   Multifocal hepatic metastases, increased.   Mild upper abdominal lymphadenopathy, mildly increased.   Masslike thickening involving the terminal ileum, suspicious for metastasis, similar to the prior. Additional nonspecific wall thickening involving multiple loops of small bowel in the pelvis.   Small volume pelvic ascites, mildly increased.   07/18/2018 PET scan   IMPRESSION: 1. Examination is positive for extensive neuroendocrine tumor receptor avid metastatic disease involving the liver, axial and appendicular skeleton, thoracic and abdominal lymph nodes. There is a dominant mass within the mesentery which is intensely radiotracer avid with a maximum dimension of 11.9 cm and an SUV max of 44.32. Within the distal small bowel loops there is a radiotracer avid lesion likely within the distal ileum. 2. When compared with CT from 06/13/2018 the tumor volume within the abdomen is not significantly changed in the interval. 3.  Aortic Atherosclerosis (ICD10-I70.0). 4. Coronary artery atherosclerotic calcifications 5. Nonobstructing left renal calculi   08/21/2018 - 02/05/2019 Chemotherapy   Lutathera treatment with Sandostatin injection 08/21/18, 10/16/18, 12/11/18, 02/05/19   03/12/2019 PET scan   IMPRESSION: 1. Marked interval positive response to peptide receptor radiotherapy. 2. Significant decrease in size and radiotracer activity within liver metastasis. 3. Significant decrease in size and radiotracer activity of large peritoneal mass in RIGHT lower quadrant. 4. Decrease in size and radiotracer activity of retroperitoneal adenopathy in the upper abdomen. 5. Decrease in size and radiotracer activity or resolution of activity of the small skeletal metastasis.     05/22/2019 - 07/19/2019 Chemotherapy   Monthly Sandostatin injections restarted on 05/22/19-07/19/19. Did not improve symptoms of diarrhea,  fatigue and did not improve which is likely related to his persistent high tumor burden.    06/17/2019 Imaging   CT AP W Contast  IMPRESSION: Multifocal hepatic metastases, unchanged from recent PET.   Mesenteric nodal metastasis in the right mid abdomen, unchanged from recent PET.   Stable wall thickening involving the distal ileum.   No focal osseous metastases are evident on CT.   Additional ancillary findings as above.     09/17/2019 - 04/2020 Chemotherapy   Third line therapy oral Afinitor 7.5mg  daily starting 09/17/19. Increased to full dose 10mg  starting 12/11/19. Afinitor held since 04/2020 due to LE edema and skin erythema. Now on observation   12/12/2019 Imaging   CT AP W Contrast IMPRESSION: 1. Liver metastases are all mildly decreased. 2. Conglomerate nodal mass in the right mesentery is mildly decreased. 3. No new or progressive metastatic disease in the abdomen or pelvis. 4. Chronic wall thickening in the distal and terminal ileum and cecum surrounding the ileocecal valve, unchanged. No evidence of bowel obstruction. 5. Aortic Atherosclerosis (ICD10-I70.0).   12/04/2020 Imaging   CT CAP  IMPRESSION: 1. Slight interval increase in size of a soft tissue mass in the right hemiabdomen mesentery, consistent with worsened mesenteric metastasis. 2. Unchanged thickened, tethered appearance of the cecal base and terminal ileum. 3. Multiple hypodense liver metastases are unchanged. 4. No evidence of metastatic disease in the chest. No evidence of new metastatic disease in the abdomen or pelvis. 5. Unchanged enlargement of the tubular ascending thoracic aorta measuring up to 4.3 x 4.2 cm. Recommend annual imaging followup by CTA or MRA. This recommendation follows 2010 ACCF/AHA/AATS/ACR/ASA/SCA/SCAI/SIR/STS/SVM Guidelines for the Diagnosis and Management of Patients with Thoracic Aortic Disease. Circulation. 2010; 121: S287-G811. Aortic aneurysm NOS (ICD10-I71.9) 6. Coronary  artery  disease. 7. Unchanged small volume fluid in the low right hemipelvis, likely loculated.   Aortic Atherosclerosis (ICD10-I70.0).     06/08/2021 Imaging   CT CAP  IMPRESSION: 1. Mild increase in size of soft tissue mass within the small bowel mesentery. 2. No significant change in the size or multiplicity of liver metastases. 3. New cluster of small nodules within the anterior right middle lobe are likely postinflammatory. Attention on follow-up imaging advised. 4. Small volume of free fluid noted within the pelvis. Increased from previous exam. 5. Aortic atherosclerosis. Coronary artery calcifications noted.   10/22/2021 PET scan   NETSPOT GA 68 DOTATATE  IMPRESSION: 1. Mild progression neuroendocrine tumor. Several lesions in the liver continue to decrease radiotracer activity; however, there is one clearly new metastatic lesion in the RIGHT hepatic lobe. 2. A concerning new finding is linear radiotracer activity within the central canal from T 10 to L1 consistent with new metastasis. Consider MRI of the thoracolumbar spine with contrast to evaluate for metastatic disease in the central canal. 3. Large mesenteric central nodule with decreased radiotracer activity and mild increased in size compared to PET-CT scan June 2020. 4. Single new small peritoneal metastasis in the deep RIGHT pelvis. 5. Scattered skeletal metastasis are decreased radiotracer activity. 6. NEW large volume ascites/intraperitoneal free fluid of unclear clear etiology. Large volume intraperitoneal free fluid is new from CT scan 06/08/2021. There is submucosal edema within the small bowel of the lower abdomen favored passive congestion. No evidence of bowel obstruction.      INTERVAL HISTORY:  Ryan Cowan is here for a follow up of metastatic carcinoid tumor. He was last seen by me on 10/25/21. He presents to the clinic accompanied by his wife. He reports swelling in his ankles, fatigue,  bloating with upper abdominal tenderness. He also reports pain in his hips; he wonders if this is arthritis.   All other systems were reviewed with the patient and are negative.  MEDICAL HISTORY:  Past Medical History:  Diagnosis Date   Colon cancer ([place]) dx'd 02/2015   Liver metastases ([place]) dx'd 02/2015   Metastatic cancer to bone [facility]) dx'd 2019    SURGICAL HISTORY: Past Surgical History:  Procedure Laterality Date   APPENDECTOMY     KNEE SURGERY     SHOULDER SURGERY      I have reviewed the social history and family history with the patient and they are unchanged from previous note.  ALLERGIES:  is allergic to codeine.  MEDICATIONS:  Current Outpatient Medications  Medication Sig Dispense Refill   furosemide (LASIX) 20 MG tablet Take 1 tablet (20 mg total) by mouth daily as needed. 20 tablet 0   potassium chloride SA (KLOR-CON M) 20 MEQ tablet Take 1 tablet (20 mEq total) by mouth daily. Take it along with Lasix 20 tablet 0   calcium-vitamin D (OSCAL WITH D) 500-200 MG-UNIT tablet Take 2 tablets by mouth daily with breakfast.     cyanocobalamin 2000 MCG tablet Take 1,000 mcg by mouth daily.     everolimus (AFINITOR) 10 MG tablet Take 1 tablet (10 mg total) by mouth daily. 30 tablet 1   levothyroxine (SYNTHROID) 100 MCG tablet Take 100 mcg by mouth daily.     Multiple Vitamin (MULTIVITAMIN WITH MINERALS) TABS tablet Take 1 tablet by mouth daily. Centrum Silver 50 plus     naproxen sodium (ANAPROX) 220 MG tablet Take 220 mg by mouth 2 (two) times daily as needed (pain).     OVER THE  COUNTER MEDICATION Place 1 application into both nostrils daily as needed (congestion). Vicks inhaler stick     sertraline (ZOLOFT) 100 MG tablet Take 100 mg by mouth daily.      Telotristat Ethyl,as Etiprate, (XERMELO) 250 MG TABS Take 250 mg by mouth in the morning, at noon, and at bedtime. 90 tablet 1   No current facility-administered medications for this visit.    PHYSICAL EXAMINATION: ECOG  PERFORMANCE STATUS: 2 - Symptomatic, <50% confined to bed  Vitals:   11/22/21 1419  BP: (!) 141/66  Pulse: 73  Resp: 18  Temp: 97.7 F (36.5 C)  SpO2: 99%   Wt Readings from Last 3 Encounters:  11/22/21 143 lb 9.6 oz (65.1 kg)  10/25/21 138 lb 6.4 oz (62.8 kg)  09/23/21 132 lb 14.4 oz (60.3 kg)     GENERAL:alert, no distress and comfortable SKIN: skin color, texture, turgor are normal, no rashes or significant lesions EYES: normal, Conjunctiva are pink and non-injected, sclera clear  ABDOMEN: (+) bloating with fluids, (+) mild tenderness to upper abdomen Musculoskeletal:no cyanosis of digits and no clubbing  NEURO: alert & oriented x 3 with fluent speech, no focal motor/sensory deficits  LABORATORY DATA:  I have reviewed the data as listed CBC Latest Ref Rng & Units 11/22/2021 10/25/2021 09/23/2021  WBC 4.0 - 10.5 K/uL 4.9 5.6 6.1  Hemoglobin 13.0 - 17.0 g/dL 10.8(L) 10.2(L) 11.6(L)  Hematocrit 39.0 - 52.0 % 33.2(L) 30.5(L) 35.2(L)  Platelets 150 - 400 K/uL 287 258 252     CMP Latest Ref Rng & Units 11/22/2021 10/25/2021 09/23/2021  Glucose 70 - 99 mg/dL 81 91 78  BUN 8 - 23 mg/dL 21 16 17   Creatinine 0.61 - 1.24 mg/dL 0.83 0.78 0.81  Sodium 135 - 145 mmol/L 141 142 140  Potassium 3.5 - 5.1 mmol/L 3.7 3.3(L) 3.9  Chloride 98 - 111 mmol/L 105 107 105  CO2 22 - 32 mmol/L 28 27 28   Calcium 8.9 - 10.3 mg/dL 8.6(L) 8.6(L) 8.8(L)  Total Protein 6.5 - 8.1 g/dL 6.5 6.5 6.3(L)  Total Bilirubin 0.3 - 1.2 mg/dL 0.3 0.2(L) 0.3  Alkaline Phos 38 - 126 U/L 97 84 85  AST 15 - 41 U/L 18 18 13(L)  ALT 0 - 44 U/L 9 11 9       RADIOGRAPHIC STUDIES: I have personally reviewed the radiological images as listed and agreed with the findings in the report. No results found.    Orders Placed This Encounter  Procedures   US Paracentesis    Standing Status:   Future    Standing Expiration Date:   11/22/2022    Order Specific Question:   If therapeutic, is there a maximum amount of fluid  to be removed?    Answer:   Yes    Order Specific Question:   What is the maximum amount of fluide to be removed?    Answer:   5L    Order Specific Question:   Are labs required for specimen collection?    Answer:   Yes    Order Specific Question:   Lab orders requested (DO NOT place separate lab orders, these will be automatically ordered during procedure specimen collection):    Answer:   Cytology - Non Pap    Order Specific Question:   Lab orders requested (DO NOT place separate lab orders, these will be automatically ordered during procedure specimen collection):    Answer:   Body Fluid Cell Count With Differential  Order Specific Question:   Is Albumin medication needed?    Answer:   No    Order Specific Question:   Reason for Exam (SYMPTOM  OR DIAGNOSIS REQUIRED)    Answer:   Symptom Relieve    Order Specific Question:   Preferred imaging location?    Answer:   [facility]   Ambulatory Referral to [facility] Nutrition    Referral Priority:   Routine    Referral Type:   Consultation    Referral Reason:   Specialty Services Required    Number of Visits Requested:   1   All questions were answered. The patient knows to call the clinic with any problems, questions or concerns. No barriers to learning was detected. The total time spent in the appointment was 30 minutes.     [name], MD 11/22/2021   I, Wilburn Mylar, am acting as scribe for [name], MD.   I have reviewed the above documentation for accuracy and completeness, and I agree with the above.

## 2021-11-23 ENCOUNTER — Telehealth: Payer: Self-pay | Admitting: Hematology

## 2021-11-23 NOTE — Telephone Encounter (Signed)
.  Called patient to schedule appointment per 2/20 inbasket, patient is aware of date and time.   °

## 2021-11-24 ENCOUNTER — Other Ambulatory Visit (HOSPITAL_COMMUNITY): Payer: Self-pay | Admitting: Hematology

## 2021-11-24 DIAGNOSIS — C7A8 Other malignant neuroendocrine tumors: Secondary | ICD-10-CM

## 2021-11-25 ENCOUNTER — Other Ambulatory Visit (HOSPITAL_COMMUNITY): Payer: Self-pay | Admitting: Hematology

## 2021-11-25 DIAGNOSIS — C7A8 Other malignant neuroendocrine tumors: Secondary | ICD-10-CM

## 2021-11-26 ENCOUNTER — Other Ambulatory Visit: Payer: Self-pay

## 2021-11-26 ENCOUNTER — Telehealth: Payer: Self-pay

## 2021-11-26 ENCOUNTER — Ambulatory Visit (HOSPITAL_COMMUNITY)
Admission: RE | Admit: 2021-11-26 | Discharge: 2021-11-26 | Disposition: A | Discharge status: Home / Self Care | Payer: Medicare Other | Source: Ambulatory Visit | Attending: Hematology | Admitting: Hematology

## 2021-11-26 DIAGNOSIS — C7A029 Malignant carcinoid tumor of the large intestine, unspecified portion: Secondary | ICD-10-CM | POA: Insufficient documentation

## 2021-11-26 LAB — BODY FLUID CELL COUNT WITH DIFFERENTIAL
Lymphs, Fluid: 10 %
Monocyte-Macrophage-Serous Fluid: 41 % — ABNORMAL LOW (ref 50–90)
Neutrophil Count, Fluid: 49 % — ABNORMAL HIGH (ref 0–25)
Total Nucleated Cell Count, Fluid: 281 cu mm (ref 0–1000)

## 2021-11-26 MED ORDER — LIDOCAINE HCL 1 % IJ SOLN
INTRAMUSCULAR | Status: AC
  Administered 2021-11-26: 15 mL
  Filled 2021-11-26: qty 20

## 2021-11-26 NOTE — Procedures (Signed)
Ultrasound-guided diagnostic and therapeutic paracentesis performed yielding 5 liters (maximum ordered) of hazy, cream colored fluid. No immediate complications. A portion of the fluid was sent to the lab for preordered studies. EBL none.

## 2021-11-26 NOTE — Telephone Encounter (Signed)
Received fax request from pt's PCP Dr. Arelia Sneddon at [place] requesting Dr. Ernestina Penna last office note.  Faxed last office note to [phone] F [phone].  Fax confirmation received.

## 2021-11-29 ENCOUNTER — Other Ambulatory Visit (HOSPITAL_COMMUNITY): Payer: Self-pay

## 2021-11-29 ENCOUNTER — Inpatient Hospital Stay: Payer: Medicare Other

## 2021-11-29 ENCOUNTER — Inpatient Hospital Stay: Payer: Medicare Other | Admitting: Nutrition

## 2021-11-29 ENCOUNTER — Other Ambulatory Visit: Payer: Self-pay

## 2021-11-29 DIAGNOSIS — C7951 Secondary malignant neoplasm of bone: Secondary | ICD-10-CM | POA: Diagnosis not present

## 2021-11-29 DIAGNOSIS — C7A029 Malignant carcinoid tumor of the large intestine, unspecified portion: Secondary | ICD-10-CM

## 2021-11-29 LAB — CBC WITH DIFFERENTIAL/PLATELET
Abs Immature Granulocytes: 0.01 10*3/uL (ref 0.00–0.07)
Basophils Absolute: 0.1 10*3/uL (ref 0.0–0.1)
Basophils Relative: 1 %
Eosinophils Absolute: 0.2 10*3/uL (ref 0.0–0.5)
Eosinophils Relative: 3 %
HCT: 34.6 % — ABNORMAL LOW (ref 39.0–52.0)
Hemoglobin: 11.4 g/dL — ABNORMAL LOW (ref 13.0–17.0)
Immature Granulocytes: 0 %
Lymphocytes Relative: 6 %
Lymphs Abs: 0.4 10*3/uL — ABNORMAL LOW (ref 0.7–4.0)
MCH: 30.1 pg (ref 26.0–34.0)
MCHC: 32.9 g/dL (ref 30.0–36.0)
MCV: 91.3 fL (ref 80.0–100.0)
Monocytes Absolute: 0.6 10*3/uL (ref 0.1–1.0)
Monocytes Relative: 11 %
Neutro Abs: 4.5 10*3/uL (ref 1.7–7.7)
Neutrophils Relative %: 79 %
Platelets: 284 10*3/uL (ref 150–400)
RBC: 3.79 MIL/uL — ABNORMAL LOW (ref 4.22–5.81)
RDW: 13.8 % (ref 11.5–15.5)
WBC: 5.7 10*3/uL (ref 4.0–10.5)
nRBC: 0 % (ref 0.0–0.2)

## 2021-11-29 LAB — COMPREHENSIVE METABOLIC PANEL
ALT: 11 U/L (ref 0–44)
AST: 20 U/L (ref 15–41)
Albumin: 3.1 g/dL — ABNORMAL LOW (ref 3.5–5.0)
Alkaline Phosphatase: 84 U/L (ref 38–126)
Anion gap: 8 (ref 5–15)
BUN: 22 mg/dL (ref 8–23)
CO2: 24 mmol/L (ref 22–32)
Calcium: 8.3 mg/dL — ABNORMAL LOW (ref 8.9–10.3)
Chloride: 110 mmol/L (ref 98–111)
Creatinine, Ser: 0.96 mg/dL (ref 0.61–1.24)
GFR, Estimated: >60 mL/min (ref >60)
Glucose, Bld: 102 mg/dL — ABNORMAL HIGH (ref 70–99)
Potassium: 3.2 mmol/L — ABNORMAL LOW (ref 3.5–5.1)
Sodium: 142 mmol/L (ref 135–145)
Total Bilirubin: 0.2 mg/dL — ABNORMAL LOW (ref 0.3–1.2)
Total Protein: 6.3 g/dL — ABNORMAL LOW (ref 6.5–8.1)

## 2021-11-29 NOTE — Progress Notes (Signed)
Nutrition follow-up completed with patient and sister.  Patient was diagnosed with a carcinoid tumor with metastasis to the liver.  He was last seen in June 2016 by this RD.  CURRENT THERAPY:  -Xgeva starting 10/17/19, currently q15months -Afinitor 10 mg daily, restarted 09/23/21 -Sandostatin injections q4weeks, restarted 09/23/21  Patient is status post paracentesis which removed 5 L of fluid after he was noted to have ascites/intra peritoneal fluid  Patient weighed 129 pounds today.  He still has some swelling in his lower extremities.  Labs noted: Potassium 3.2, glucose 102, albumin 3.1.  Patient reports he is in the bathroom quite often and endorses diarrhea.  His oral intake is all over the place in terms of different foods.  He especially loves canned greens and diced tomatoes.  He tolerates eggs, yogurt, low lactose milk.  He has tried a variety of frozen dinners.  Reports he drank an Ensure Enlive and felt bloated.  He typically eats some type of breakfast meat in the morning such as sausage/bacon/ham.  He has tried to eat hotdogs.  Nutrition diagnosis: Unintended weight loss continues.  Intervention: Educated patient on the importance of following low fiber diet and assessing tolerance of food. Reviewed sources of high-calorie, high protein foods that are permitted on diet. Provided a sample of Ensure clear and Ensure Plus both low in fiber.  Also provided patient with Enterade samples. Written information provided.  Questions were answered.  Teach back method used.  Monitoring, evaluation, goals: Patient will tolerate adequate calories and protein to minimize weight loss.  Next visit: March 29 with Vinnie Level.  **Disclaimer: This note was dictated with voice recognition software. Similar sounding words can inadvertently be transcribed and this note may contain transcription errors which may not have been corrected upon publication of note.**

## 2021-12-02 ENCOUNTER — Other Ambulatory Visit: Payer: Self-pay | Admitting: Hematology

## 2021-12-02 LAB — CYTOLOGY - NON PAP

## 2021-12-02 MED ORDER — EVEROLIMUS 10 MG PO TABS: 10.0000 mg | ORAL_TABLET | Freq: Every day | ORAL | 1 refills | Status: AC

## 2021-12-06 ENCOUNTER — Other Ambulatory Visit: Payer: Self-pay

## 2021-12-06 ENCOUNTER — Telehealth: Payer: Self-pay

## 2021-12-06 MED ORDER — POTASSIUM CHLORIDE CRYS ER 20 MEQ PO TBCR: 20.0000 meq | EXTENDED_RELEASE_TABLET | Freq: Two times a day (BID) | ORAL | 1 refills | Status: DC

## 2021-12-06 NOTE — Progress Notes (Signed)
Verbal order given to discontinue Potassium Chloride SA 55mq PO daily.  Start taking Potassium Chloride SA 47m PO daily.  K+ dose changed d/t hypokalemia r/t furosemide use.  Staff message dated 12/04/2021 sent from Dr. FeBurr Medicoo change pt's K+ dose. ?

## 2021-12-06 NOTE — Telephone Encounter (Signed)
LVM stating that pt's K+ lab drawn on 11/29/2021 was low (3.2) and Dr. Burr Medico would like for the pt to start taking 2 pill of the Potassium Chloride SA (Klor Con) daily.  Informed pt that a new prescription has been sent to [facility] for the potassium.  Also, informed pt that Dr. Burr Medico stated that the decrease in his K+ is related to his Lasix (Furosemide) fluid medication he's taking.  Stated that Dr. Burr Medico will continue to monitor his labs/blood work.  Instructed pt to please call Dr. Ernestina Penna office should he have additional questions or concerns. ?

## 2021-12-09 ENCOUNTER — Telehealth: Payer: Self-pay

## 2021-12-09 NOTE — Telephone Encounter (Signed)
Spoke with pt regarding appts on 03/20 and 04/06.  Informed pt that Dr. Burr Medico will see him in clinic on 12/20/2021 at which time they will go over the Afinitor.  Informed pt that he will not get his Sandostatin Inj on 12/20/2021.  Informed pt that his Sandostatin Inj will be administered on 01/06/2022 with his Lutathera Inj.  Pt verbalized understanding of information and had no further questions or concerns at this time regarding his medications. ?

## 2021-12-14 ENCOUNTER — Encounter: Payer: Self-pay | Admitting: Hematology

## 2021-12-14 NOTE — Progress Notes (Signed)
[employer] Patient Connect Denial letter is on ONBASE for whomever applied on patient behalf and titled as Cop[place] Luthera. ?

## 2021-12-20 ENCOUNTER — Inpatient Hospital Stay: Payer: Medicare Other | Attending: Hematology | Admitting: Hematology

## 2021-12-20 ENCOUNTER — Other Ambulatory Visit: Payer: Self-pay

## 2021-12-20 ENCOUNTER — Inpatient Hospital Stay: Payer: Medicare Other

## 2021-12-20 ENCOUNTER — Encounter: Payer: Self-pay | Admitting: Hematology

## 2021-12-20 ENCOUNTER — Telehealth: Payer: Self-pay

## 2021-12-20 ENCOUNTER — Ambulatory Visit (HOSPITAL_COMMUNITY)
Admission: RE | Admit: 2021-12-20 | Discharge: 2021-12-20 | Disposition: A | Discharge status: Home / Self Care | Payer: Medicare Other | Source: Ambulatory Visit | Attending: Hematology | Admitting: Hematology

## 2021-12-20 ENCOUNTER — Inpatient Hospital Stay: Payer: Medicare Other | Admitting: Licensed Clinical Social Worker

## 2021-12-20 VITALS — BP 129/60 | HR 62 | Temp 97.7°F | Resp 18 | Ht 72.0 in | Wt 146.1 lb

## 2021-12-20 DIAGNOSIS — C772 Secondary and unspecified malignant neoplasm of intra-abdominal lymph nodes: Secondary | ICD-10-CM | POA: Diagnosis not present

## 2021-12-20 DIAGNOSIS — R5383 Other fatigue: Secondary | ICD-10-CM | POA: Insufficient documentation

## 2021-12-20 DIAGNOSIS — C787 Secondary malignant neoplasm of liver and intrahepatic bile duct: Secondary | ICD-10-CM | POA: Insufficient documentation

## 2021-12-20 DIAGNOSIS — Z79899 Other long term (current) drug therapy: Secondary | ICD-10-CM | POA: Insufficient documentation

## 2021-12-20 DIAGNOSIS — C7A029 Malignant carcinoid tumor of the large intestine, unspecified portion: Secondary | ICD-10-CM

## 2021-12-20 DIAGNOSIS — D500 Iron deficiency anemia secondary to blood loss (chronic): Secondary | ICD-10-CM | POA: Insufficient documentation

## 2021-12-20 DIAGNOSIS — Z66 Do not resuscitate: Secondary | ICD-10-CM

## 2021-12-20 DIAGNOSIS — F419 Anxiety disorder, unspecified: Secondary | ICD-10-CM | POA: Diagnosis not present

## 2021-12-20 DIAGNOSIS — R188 Other ascites: Secondary | ICD-10-CM | POA: Diagnosis not present

## 2021-12-20 DIAGNOSIS — C7951 Secondary malignant neoplasm of bone: Secondary | ICD-10-CM | POA: Diagnosis not present

## 2021-12-20 DIAGNOSIS — F32A Depression, unspecified: Secondary | ICD-10-CM | POA: Diagnosis not present

## 2021-12-20 LAB — COMPREHENSIVE METABOLIC PANEL
ALT: 10 U/L (ref 0–44)
AST: 18 U/L (ref 15–41)
Albumin: 3.1 g/dL — ABNORMAL LOW (ref 3.5–5.0)
Alkaline Phosphatase: 89 U/L (ref 38–126)
Anion gap: 9 (ref 5–15)
BUN: 31 mg/dL — ABNORMAL HIGH (ref 8–23)
CO2: 23 mmol/L (ref 22–32)
Calcium: 8.6 mg/dL — ABNORMAL LOW (ref 8.9–10.3)
Chloride: 107 mmol/L (ref 98–111)
Creatinine, Ser: 0.96 mg/dL (ref 0.61–1.24)
GFR, Estimated: >60 mL/min (ref >60)
Glucose, Bld: 101 mg/dL — ABNORMAL HIGH (ref 70–99)
Potassium: 4 mmol/L (ref 3.5–5.1)
Sodium: 139 mmol/L (ref 135–145)
Total Bilirubin: 0.2 mg/dL — ABNORMAL LOW (ref 0.3–1.2)
Total Protein: 6.4 g/dL — ABNORMAL LOW (ref 6.5–8.1)

## 2021-12-20 LAB — CBC WITH DIFFERENTIAL/PLATELET
Abs Immature Granulocytes: 0.01 10*3/uL (ref 0.00–0.07)
Basophils Absolute: 0 10*3/uL (ref 0.0–0.1)
Basophils Relative: 1 %
Eosinophils Absolute: 0.2 10*3/uL (ref 0.0–0.5)
Eosinophils Relative: 5 %
HCT: 31.7 % — ABNORMAL LOW (ref 39.0–52.0)
Hemoglobin: 9.9 g/dL — ABNORMAL LOW (ref 13.0–17.0)
Immature Granulocytes: 0 %
Lymphocytes Relative: 7 %
Lymphs Abs: 0.3 10*3/uL — ABNORMAL LOW (ref 0.7–4.0)
MCH: 28.9 pg (ref 26.0–34.0)
MCHC: 31.2 g/dL (ref 30.0–36.0)
MCV: 92.7 fL (ref 80.0–100.0)
Monocytes Absolute: 0.5 10*3/uL (ref 0.1–1.0)
Monocytes Relative: 12 %
Neutro Abs: 3.1 10*3/uL (ref 1.7–7.7)
Neutrophils Relative %: 75 %
Platelets: 258 10*3/uL (ref 150–400)
RBC: 3.42 MIL/uL — ABNORMAL LOW (ref 4.22–5.81)
RDW: 14.6 % (ref 11.5–15.5)
WBC: 4.2 10*3/uL (ref 4.0–10.5)
nRBC: 0 % (ref 0.0–0.2)

## 2021-12-20 MED ORDER — FUROSEMIDE 20 MG PO TABS: 20.0000 mg | ORAL_TABLET | Freq: Every day | ORAL | 1 refills | Status: DC | PRN

## 2021-12-20 MED ORDER — POTASSIUM CHLORIDE CRYS ER 20 MEQ PO TBCR: 20.0000 meq | EXTENDED_RELEASE_TABLET | Freq: Two times a day (BID) | ORAL | 1 refills | Status: AC

## 2021-12-20 NOTE — Progress Notes (Signed)
?[facility]   ?[phone] [phone]   ?Clinic Follow up Note  ? ?Patient Care Team: ?Leonard Downing, MD as PCP - General (Family Medicine) ? ?Date of Service:  12/20/2021 ? ?CHIEF COMPLAINT: f/u of metastatic carcinoid tumor ? ?CURRENT THERAPY:  ?-Xgeva starting 10/17/19, currently q73month ?-Afinitor 10 mg daily, restarted 09/23/21 ?-Sandostatin injections q4weeks, restarted 09/23/21 ? ?ASSESSMENT & PLAN:  ?Ryan PITKINis a 76y.o. male with  ? ?1. 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 He was first treated with Sandostatin injections since 03/2015. He had disease progression in 06/2018 with bone mets in spine, pelvic bone and sternum.  ?-He has completed 5 treatments of Lutathera with Dr. ELeonia Reeveson 08/21/18-02/05/19.  ?-DOTATATE PET scan from 03/12/19 shows very good response to LCarolinetreatment but unlikely cured from this alone . ?-he received Sandostatin injections 8/19-10/16/20. He experienced diarrhea, fatigue, but these did not improve off treatment, indicating these were likely related to his persistent high tumor burden. ?-I started him on third line therapy oral Afinitor 7.'5mg'$  daily beginning 09/17/19. Increased to full dose '10mg'$  starting 12/11/19. Afinitor has been held since 04/2020 due to LE edema and skin erythema, which did improve off treatment.  ?-CT CAP on 06/08/21 showed further mild increase in size of mesentery mass, stable liver metastases, and likely postinflammatory RML nodules. ?-His Chromogranin A tumor marker has also been rising, most recently 2,392 on 09/23/21. Given this and his increase in diarrhea and weight loss, he restarted Afinitor and Sandostatin injections on 09/23/21. ?-DOTATATE PET on 10/22/21 showed: mild progression of NET; one new right liver lesion; new finding within T10 to L1; one new, small peritoneal metastasis in deep right pelvis; new large volume  ascites/intraperitoneal fluid.  ?-he is scheduled for Lutathera treatments on 4/6 and 03/10/22. ?-labs reviewed, overall stable. He will continue Afinitor until 12/31/21. We are holding Sandostatin today, and he will receive this on 01/06/22 after this Lutathera treatment. ?-We again discussed the incurable nature of his metastatic malignancy.  He is overall condition has deteriorated lately with more symptoms.  We discussed CODE STATUS today. ?  ?2. Ascites/intraperitoneal fluid ?-secondary to #1, seen on DOTATATE PET on 10/22/21 ?-he had paracentesis on 11/26/21. Cytology showed atypical cells. ?-I started him on lasix on 11/22/21. Because this can cause low potassium, I also called in KCL. ?-we will schedule another paracentesis next week. ?  ?3. Bone mets, Mild Hypocalcemia  ?-first noticed on PET scan in 07/2018 ?-To reduce his risk of fracture I started him on monthly Xgeva on 10/17/19. Due to hypocalcemia will reduce to q234monthstarting 12/13/19. We moved to q3m52monthtarting 04/09/21 ?-Calcium decreased on treatment, will monitor ?-he reports rib pain today. I ordered x-ray to be done today. ?  ?4. Iron deficient anemia ?-Secondary to the blood loss from his cecum tumor, then since Lutathera treatment ?-He is not on oral iron supplement, but has been treated with IV Feraheme before with complete response.  ?-worsened some since restarting treatment, hgb 9.9 today (12/20/21) ?  ?5. Anxiety/Depression and fatigue  ?-On Zoloft '100mg'$  currently, Managed by Dr. ElkArelia Sneddon-mood previously worsened on Afinitor. Will monitor. ?  ?6. Goal of care discussion, DNR  ?-We again discussed the incurable nature of his cancer, and the overall poor prognosis, especially if he does not have good response to chemotherapy or progress on chemo ?-The patient understands the goal of care is palliative. ?-  We had a long conversation about the palliative nature of his treatment, and CODE STATUS.  I recommend DNR/DNI, he agrees today (12/20/21).  I  signed his order and give him multiple copies. ? ?  ?Plan ?-proceed to rib x-ray today ?-continue Afinitor until 3/31 ?-I refilled lasix and KCL ?-social work referral for his social benefits and his needs for care at home  ?-[place] on 01/06/22 ?-lab and f/u  and injection on 5/4  ? ? ?No problem-specific Assessment & Plan notes found for this encounter. ? ? ?SUMMARY OF ONCOLOGIC HISTORY: ?Oncology History Overview Note  ?Cancer Staging ?Carcinoid tumor of colon, malignant ([place]) ?Staging form: Colon and Rectum, AJCC 7th Edition ?- Clinical: Stage Unknown ([place], N2b, M1) - Unsigned ? ? ? ?  ?Carcinoid tumor of colon, malignant ([place])  ?02/16/2015 Imaging  ? CT abdomen and pelvis with contrast showed wall thickening of the cecum and terminal ileum high suspicious for cecal neoplasm. Extensive confluent ileal colic adenopathy extending chronically along the superior mesenteric vein, multiple hepatic metastasis ?  ?02/18/2015 Initial Diagnosis  ? Carcinoid tumor of colon, malignant, with liver metastasis ?  ?02/18/2015 Initial Biopsy  ? 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 The tumor is positive for CD X2, CD 56, chromogranin, and synaptophysin. ?  ?03/05/2015 - 07/24/2018 Chemotherapy  ? Sandostatin 30 mg IM every 4 weeks. Due to disease progression regular monthly injections stopped after 07/24/18 ?  ?03/06/2015 Imaging  ? CT chest showed no evidence of metastasis in the chest. 4.4 cm ascending aortic aneurysm. ?  ?09/09/2015 Imaging  ? Restaging CT abdomen and pelvis with contrast showed interval response of hepatic metastasis. Most are small and, some are stable. Slight interval decrease in mesenteric adenopathy and primary colon tumor.  ?  ?12/06/2016 Imaging  ? CT CAP w contrast ?IMPRESSION: ?1. Overall, the extent of metastatic disease to the liver and ?abdominal/retroperitoneal lymphatics is similar to the prior ?examination 08/19/2016. 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 ?2. Multiple small pulmonary nodules appear unchanged in size, number ?and distribution, favored to be benign. ?3. Aortic atherosclerosis, in addition to 2 vessel coronary artery ?disease. Assessment for potential risk factor modification, dietary ?therapy or pharmacologic therapy may be warranted, if clinically ?indicated. ?4. In addition, there is dilatation of the aortic root (4.7 cm in ?diameter at the level of the sinuses of Valsalva) and ectasia of the ?ascending thoracic aorta measuring 4.4 cm in diameter. Recommend ?annual imaging followup by CTA or MRA. This recommendation follows ?2010 ACCF/AHA/AATS/ACR/ASA/SCA/SCAI/SIR/STS/SVM Guidelines for the ?Diagnosis and Management of Patients with Thoracic Aortic Disease. ?Circulation. 2010; 121: L465-K354. ?5. Trace volume of ascites. ?6. Additional incidental findings, as above. ?  ?  ?05/31/2017 Imaging  ? CT CAP W Contrast 05/31/17 ?IMPRESSION: ?1. Overall the extent of metastatic disease to the liver and ?abdominal adenopathy is similar to previous exam from 12/06/2016. ?2. 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 ?3. Stable small pulmonary nodules. ?4.  Aortic Atherosclerosis (ICD10-I70.0). ?5. Dilatation of the ascending thoracic aorta. Unchanged. Recommend ?annual imaging followup by CTA or MRA. This recommendation follows ?2010 ACCF/AHA/AATS/ACR/ASA/SCA/SCAI/SIR/STS/SVM Guidelines for the ?Diagnosis and Management of Patients with Thoracic Aortic Disease. ?Circulation. 2010; 121: S568-L275 ?6. Trace ascites. ?  ?12/07/2017 Imaging  ? CT CAP W Contrast 12/07/17 ?IMPRESSION: ?Chest Impression: ? 1. No evidence of thoracic metastasis. ?2. Stable ascending thoracic aortic aneurysm  at 4.4 cm. Recommend ?attention on oncology surveillance. ? Abdomen / Pelvis Impression: ? 1. Mild interval increase in size of multifocal hepatic  metastasis. ?2. Large central small bowel mesenteric mass is unchanged ?3. No bowel obstruction. ?4. No skeletal lesions ?  ?  ?06/12/2018 Imaging  ? 06/12/2018 CT AP ?IMPRESSION: ?Large mesenteric mass in the right mi

## 2021-12-20 NOTE — Telephone Encounter (Signed)
This nurse spoke with patients sister and made aware of scheduled US Paracentesis for Monday 3/27 at 130 pm.  Advised to arrive at [facility] for registration by 115pm.  She has acknowledged understanding.  No further questions or concerns at this time.   ?

## 2021-12-23 LAB — CHROMOGRANIN A: Chromogranin A (ng/mL): 2255 ng/mL — ABNORMAL HIGH (ref 0.0–101.8)

## 2021-12-24 ENCOUNTER — Encounter: Payer: Self-pay | Admitting: Licensed Clinical Social Worker

## 2021-12-24 NOTE — Progress Notes (Signed)
[facility] Social Work  ?Initial Assessment ? ? ?Ryan Cowan is a 76 y.o. year old male contacted by phone. Clinical Social Work was referred by medical provider for assessment of psychosocial needs.  ? ?SDOH (Social Determinants of Health) assessments performed: Yes ?  ?Distress Screen completed: No ?   ? View : No data to display.  ?  ?  ?  ? ? ? ? ?Family/Social Information:  ?Housing Arrangement: patient lives alone ?Family members/support persons in your life? Pt's younger sister has been acting as pt's "caregiver"; however, her spouse suffers from Parkinson's and it is becoming more difficult for her to check in on pt daily.  It is anticipated pt will require more care as his disease progresses. ?Transportation concerns: No concerns at this time.  Pt continues to be able to drive and his sister provides transportation as necessary as well.  ?Employment: Retired. Income source: Theatre stage manager Income ?Financial concerns:  At present pt is able to afford his monthly bills; however, if a care taker were to need to be hired pt would be unable to pay out of pocket.  Pt's sister is working with a Chief Executive Officer presently to assist w/ applying pt for Medicaid.  If pt is approved for Medicaid it will alleviate this concern. ?Type of concern: Care giving (Child care or elder care services) ?Food access concerns: no ?Religious or spiritual practice: yes ?Services Currently in place:  none ? ?Coping/ Adjustment to diagnosis: ?Patient understands treatment plan and what happens next? yes ?Concerns about diagnosis and/or treatment: How I will pay for the services I need ?Patient reported stressors: Finances ?Hopes and priorities: Pt's hope is to remain independent for as long as possible ?Patient enjoys  his connection to his church.   ?Current coping skills/ strengths: Religious Affiliation  ? ? ? SUMMARY: ?Current SDOH Barriers:  ?Financial constraints related to possible future physical care needs. ? ?Clinical  Social Work Clinical Goal(s):  ?patient will work with SW to address concerns related to financial constraints if home care needed in the future will need to be paid for privately ? ?Interventions: ?Discussed common feeling and emotions when being diagnosed with cancer, and the importance of support during treatment ?Informed patient of the support team roles and support services at [facility] ?Provided CSW contact information and encouraged patient to call with any questions or concerns ?CSW provided pt w/ contact information to reach out once a determination has been made regarding Medicaid or should any other needs arise. ? ? ?Follow Up Plan: Patient will contact CSW with any support or resource needs ?Patient verbalizes understanding of plan: Yes ? ? ? ?Henriette Combs, LCSW ?

## 2021-12-27 ENCOUNTER — Encounter: Payer: Self-pay | Admitting: Hematology

## 2021-12-27 ENCOUNTER — Ambulatory Visit (HOSPITAL_COMMUNITY)
Admission: RE | Admit: 2021-12-27 | Discharge: 2021-12-27 | Disposition: A | Discharge status: Home / Self Care | Payer: Medicare Other | Source: Ambulatory Visit | Attending: Hematology | Admitting: Hematology

## 2021-12-27 ENCOUNTER — Other Ambulatory Visit: Payer: Self-pay

## 2021-12-27 DIAGNOSIS — C7A029 Malignant carcinoid tumor of the large intestine, unspecified portion: Secondary | ICD-10-CM | POA: Insufficient documentation

## 2021-12-27 MED ORDER — LIDOCAINE HCL 1 % IJ SOLN
INTRAMUSCULAR | Status: AC
  Administered 2021-12-27: 10 mL
  Filled 2021-12-27: qty 20

## 2021-12-27 NOTE — Procedures (Signed)
PROCEDURE SUMMARY: ? ?Successful image-guided paracentesis from the left lower abdomen.  ?Yielded 5 liters of hazy, cream colored fluid.  ?No immediate complications.  ?EBL = trace. ?Patient tolerated well.  ? ?Specimen was not sent for labs. ? ?Please see imaging section of Epic for full dictation. ? ? ?Aoife Bold H Reva Pinkley PA-C ?12/27/2021 ?2:24 PM ? ? ? ?

## 2021-12-28 ENCOUNTER — Telehealth: Payer: Self-pay

## 2021-12-28 NOTE — Telephone Encounter (Signed)
LVM for pt regarding recent X-Ray results.  Stated that the pt has a possible right side undisplaced rib fracture.  Informed pt that Dr. Burr Medico would like for him to take Tylenol or Ibuprofen for the pain and that she will continue to monitor it while it heals naturally.  Instructed pt to contact Dr. Ernestina Penna office should he have additional questions or concerns. ?

## 2021-12-29 ENCOUNTER — Inpatient Hospital Stay: Payer: Medicare Other | Admitting: Dietician

## 2021-12-30 ENCOUNTER — Encounter: Payer: Self-pay | Admitting: Hematology

## 2021-12-30 NOTE — Written Directive (Addendum)
MOLECULAR IMAGING AND THERAPEUTICS WRITTEN DIRECTIVE ? ? ?PATIENT NAME: Ryan Cowan ? ?PT DOB:   Feb 18, 1946 ?                                             ?MRN: [id number] ? ?--------------------------------------------------------------------------------------------------------------------- ? ?LUTATHERA THERAPY ? ? ?RADIOPHARMACEUTICAL:  Lutetium [place] (Lutathera)   ? ? ?PRESCRIBED DOSE FOR ADMINISTRATION:  200 mCi ? ? ?ROUTE OFADMINISTRATION:  IV ? ? ?DIAGNOSIS:  Neuroendocrine Cancer ? ? ?REFERRING PHYSICIAN:  Burr Medico ? ? ?TREATMENT #: Re-treatment 1 of 2 ? ? ?DATE OF LAST LONG LIVED SOMATOSTATIN INJECTION: 11/22/21 ? ? ?ADDITIONAL PHYSICIAN COMMENTS/NOTES: ? ? ?AUTHORIZED USER SIGNATURE & TIME STAMP: ?

## 2021-12-30 NOTE — Progress Notes (Signed)
Nutrition Follow-up: ? ?Patient with carcinoid tumor with metastasis to liver. He is currently receiving Xgeva starting 10/17/19, currently q105month, Afinitor 10 mg daily, restarted 09/23/21, Sandostatin injections q4weeks, restarted 09/23/21. ? ?Met with patient and his sister in waiting area. Patient reports he has noticed stools are more formed. He is having fewer episodes of diarrhea. Patient reports he did have diarrhea after eating at [employer] a couple of days ago. Patient recalls salisbury steak, rice with gravy and greens. Patient reports he was unaware that greens were high fiber. Patient received a cookbook from the company that makes afinitor. This has been a very helpful tool for him and has brought this with him today.  ? ?Medications: Lasix, klor-con ? ?Labs: 3/20 - glucose 101, BUN 31 ? ?Anthropometrics: Last weight 146 lb 1.6 oz on 3/20 ? ?2/27 - 129 lb (? Error) ?2/20 - 143 lb 9.6 oz  ?1/23 - 138 lb 9.6 oz  ? ?NUTRITION DIAGNOSIS: Unintended weight loss stable  ? ?INTERVENTION:  ? Continue following low fiber diet. Foods to limit/avoid were reviewed today ?Reviewed high calorie, high protein foods to include  ?Continue assessing tolerance of foods ? ? ?MONITORING, EVALUATION, GOAL:  ?Weight trends, intake  ? ? ?NEXT VISIT: Wednesday May 3  ? ? ? ?

## 2022-01-06 ENCOUNTER — Other Ambulatory Visit: Payer: Self-pay | Admitting: *Deleted

## 2022-01-06 ENCOUNTER — Ambulatory Visit (HOSPITAL_COMMUNITY)
Admission: RE | Admit: 2022-01-06 | Discharge: 2022-01-06 | Disposition: A | Discharge status: Home / Self Care | Payer: Medicare Other | Source: Ambulatory Visit | Attending: Diagnostic Radiology | Admitting: Diagnostic Radiology

## 2022-01-06 ENCOUNTER — Ambulatory Visit (HOSPITAL_COMMUNITY)
Admission: RE | Admit: 2022-01-06 | Discharge: 2022-01-06 | Disposition: A | Discharge status: Home / Self Care | Payer: Medicare Other | Source: Ambulatory Visit | Attending: Hematology | Admitting: Hematology

## 2022-01-06 DIAGNOSIS — C7A029 Malignant carcinoid tumor of the large intestine, unspecified portion: Secondary | ICD-10-CM

## 2022-01-06 DIAGNOSIS — C7A8 Other malignant neuroendocrine tumors: Secondary | ICD-10-CM | POA: Insufficient documentation

## 2022-01-06 LAB — COMPREHENSIVE METABOLIC PANEL
ALT: 15 U/L (ref 0–44)
AST: 23 U/L (ref 15–41)
Albumin: 2.6 g/dL — ABNORMAL LOW (ref 3.5–5.0)
Alkaline Phosphatase: 76 U/L (ref 38–126)
Anion gap: 7 (ref 5–15)
BUN: 24 mg/dL — ABNORMAL HIGH (ref 8–23)
CO2: 23 mmol/L (ref 22–32)
Calcium: 8.3 mg/dL — ABNORMAL LOW (ref 8.9–10.3)
Chloride: 110 mmol/L (ref 98–111)
Creatinine, Ser: 1.13 mg/dL (ref 0.61–1.24)
GFR, Estimated: >60 mL/min (ref >60)
Glucose, Bld: 103 mg/dL — ABNORMAL HIGH (ref 70–99)
Potassium: 3.3 mmol/L — ABNORMAL LOW (ref 3.5–5.1)
Sodium: 140 mmol/L (ref 135–145)
Total Bilirubin: 0.3 mg/dL (ref 0.3–1.2)
Total Protein: 6.1 g/dL — ABNORMAL LOW (ref 6.5–8.1)

## 2022-01-06 LAB — CBC WITH DIFFERENTIAL/PLATELET
Abs Immature Granulocytes: 0.02 10*3/uL (ref 0.00–0.07)
Basophils Absolute: 0.1 10*3/uL (ref 0.0–0.1)
Basophils Relative: 1 %
Eosinophils Absolute: 0.1 10*3/uL (ref 0.0–0.5)
Eosinophils Relative: 2 %
HCT: 30.6 % — ABNORMAL LOW (ref 39.0–52.0)
Hemoglobin: 9.8 g/dL — ABNORMAL LOW (ref 13.0–17.0)
Immature Granulocytes: 0 %
Lymphocytes Relative: 10 %
Lymphs Abs: 0.5 10*3/uL — ABNORMAL LOW (ref 0.7–4.0)
MCH: 29.5 pg (ref 26.0–34.0)
MCHC: 32 g/dL (ref 30.0–36.0)
MCV: 92.2 fL (ref 80.0–100.0)
Monocytes Absolute: 0.9 10*3/uL (ref 0.1–1.0)
Monocytes Relative: 19 %
Neutro Abs: 3.4 10*3/uL (ref 1.7–7.7)
Neutrophils Relative %: 68 %
Platelets: 272 10*3/uL (ref 150–400)
RBC: 3.32 MIL/uL — ABNORMAL LOW (ref 4.22–5.81)
RDW: 16 % — ABNORMAL HIGH (ref 11.5–15.5)
WBC: 5 10*3/uL (ref 4.0–10.5)
nRBC: 0 % (ref 0.0–0.2)

## 2022-01-06 MED ORDER — OCTREOTIDE ACETATE 500 MCG/ML IJ SOLN: 500.0000 ug | Freq: Once | INTRAMUSCULAR | Status: DC | PRN

## 2022-01-06 MED ORDER — SODIUM CHLORIDE 0.9 % IV SOLN
8.0000 mg | Freq: Once | INTRAVENOUS | Status: AC
  Administered 2022-01-06: 8 mg via INTRAVENOUS
  Filled 2022-01-06: qty 4

## 2022-01-06 MED ORDER — ONDANSETRON HCL 8 MG PO TABS: 8.0000 mg | ORAL_TABLET | Freq: Two times a day (BID) | ORAL | 0 refills | Status: AC | PRN

## 2022-01-06 MED ORDER — AMINO ACID RADIOPROTECTANT - L-LYSINE 2.5%/L-ARGININE 2.5% IN NS
250.0000 mL/h | INTRAVENOUS | Status: AC
  Administered 2022-01-06: 250 mL/h via INTRAVENOUS
  Filled 2022-01-06: qty 1000

## 2022-01-06 MED ORDER — PROCHLORPERAZINE EDISYLATE 10 MG/2ML IJ SOLN: 10.0000 mg | Freq: Four times a day (QID) | INTRAMUSCULAR | Status: DC | PRN

## 2022-01-06 MED ORDER — OCTREOTIDE ACETATE 30 MG IM KIT
PACK | INTRAMUSCULAR | Status: AC
  Administered 2022-01-06: 30 mg via INTRAMUSCULAR
  Filled 2022-01-06: qty 1

## 2022-01-06 MED ORDER — OCTREOTIDE ACETATE 30 MG IM KIT: 30.0000 mg | PACK | Freq: Once | INTRAMUSCULAR | Status: AC

## 2022-01-06 MED ORDER — SODIUM CHLORIDE 0.9 % IV SOLN: 500.0000 mL | Freq: Once | INTRAVENOUS | Status: DC

## 2022-01-06 MED ORDER — LUTETIUM LU 177 DOTATATE 370 MBQ/ML IV SOLN
200.0000 | Freq: Once | INTRAVENOUS | Status: AC
  Administered 2022-01-06: 198 via INTRAVENOUS

## 2022-01-07 NOTE — Progress Notes (Incomplete)
PT fell during his appt. on 01/06/2022 after [id number] therapy treatment was completed. Pt was walking out to leave and tripped on his own feet, fell and hit his lt knee. Pt was advised to stay on the ground that a RN was on the way to check him out to make sure he was ok. RN Polly Cobia assesed and advised for an XRAY to be done. Dr. Leonia Reeves also was informed on the pt fall and agreed to place order for xray to insure no harm was done.pt family witnessed fall and was okay with the situation and solutions. Huddle was preformed and pt was able to leave the dept. To go home.  ?

## 2022-01-11 LAB — 5 HIAA, QUANTITATIVE, URINE, 24 HOUR
5-HIAA, Ur: 45 mg/L
5-HIAA,Quant.,24 Hr Urine: 47.3 mg/24 hr — ABNORMAL HIGH (ref 0.0–14.9)
Total Volume: 1050

## 2022-02-02 ENCOUNTER — Telehealth: Payer: Self-pay | Admitting: Hematology

## 2022-02-02 ENCOUNTER — Inpatient Hospital Stay: Payer: Medicare Other | Admitting: Hematology

## 2022-02-02 ENCOUNTER — Inpatient Hospital Stay: Payer: Medicare Other | Admitting: Dietician

## 2022-02-02 ENCOUNTER — Inpatient Hospital Stay: Payer: Medicare Other

## 2022-02-02 DIAGNOSIS — C7A029 Malignant carcinoid tumor of the large intestine, unspecified portion: Secondary | ICD-10-CM

## 2022-02-02 NOTE — Telephone Encounter (Signed)
Pt left vmail to cancel appts today, appts cancelled, desk nurse notified ?

## 2022-02-08 NOTE — Progress Notes (Signed)
[facility] ?OFFICE PROGRESS NOTE ? ?Leonard Downing, MD ?[address] ?[place] [address] ? ?DIAGNOSIS: f/u of metastatic carcinoid tumor ? ?Oncology History Overview Note  ?Cancer Staging ?Carcinoid tumor of colon, malignant ([place]) ?Staging form: Colon and Rectum, AJCC 7th Edition ?- Clinical: Stage Unknown ([place], N2b, M1) - Unsigned ? ? ? ?  ?Carcinoid tumor of colon, malignant ([place])  ?02/16/2015 Imaging  ? CT abdomen and pelvis with contrast showed wall thickening of the cecum and terminal ileum high suspicious for cecal neoplasm. Extensive confluent ileal colic adenopathy extending chronically along the superior mesenteric vein, multiple hepatic metastasis ? ?  ?02/18/2015 Initial Diagnosis  ? Carcinoid tumor of colon, malignant, with liver metastasis ? ?  ?02/18/2015 Initial Biopsy  ? 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 The tumor is positive for CD X2, CD 56, chromogranin, and synaptophysin. ? ?  ?03/05/2015 - 07/24/2018 Chemotherapy  ? Sandostatin 30 mg IM every 4 weeks. Due to disease progression regular monthly injections stopped after 07/24/18 ?  ?03/06/2015 Imaging  ? CT chest showed no evidence of metastasis in the chest. 4.4 cm ascending aortic aneurysm. ? ?  ?09/09/2015 Imaging  ? Restaging CT abdomen and pelvis with contrast showed interval response of hepatic metastasis. Most are small and, some are stable. Slight interval decrease in mesenteric adenopathy and primary colon tumor.  ? ?  ?12/06/2016 Imaging  ? CT CAP w contrast ?IMPRESSION: ?1. Overall, the extent of metastatic disease to the liver and ?abdominal/retroperitoneal lymphatics is similar to the prior ?examination 08/19/2016. 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 ?2. Multiple small pulmonary nodules appear unchanged in size, number ?and distribution, favored to be benign. ?3. Aortic atherosclerosis,  in addition to 2 vessel coronary artery ?disease. Assessment for potential risk factor modification, dietary ?therapy or pharmacologic therapy may be warranted, if clinically ?indicated. ?4. In addition, there is dilatation of the aortic root (4.7 cm in ?diameter at the level of the sinuses of Valsalva) and ectasia of the ?ascending thoracic aorta measuring 4.4 cm in diameter. Recommend ?annual imaging followup by CTA or MRA. This recommendation follows ?2010 ACCF/AHA/AATS/ACR/ASA/SCA/SCAI/SIR/STS/SVM Guidelines for the ?Diagnosis and Management of Patients with Thoracic Aortic Disease. ?Circulation. 2010; 121: C585-I778. ?5. Trace volume of ascites. ?6. Additional incidental findings, as above. ?  ? ?  ?05/31/2017 Imaging  ? CT CAP W Contrast 05/31/17 ?IMPRESSION: ?1. Overall the extent of metastatic disease to the liver and ?abdominal adenopathy is similar to previous exam from 12/06/2016. ?2. 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 ?3. Stable small pulmonary nodules. ?4.  Aortic Atherosclerosis (ICD10-I70.0). ?5. Dilatation of the ascending thoracic aorta. Unchanged. Recommend ?annual imaging followup by CTA or MRA. This recommendation follows ?2010 ACCF/AHA/AATS/ACR/ASA/SCA/SCAI/SIR/STS/SVM Guidelines for the ?Diagnosis and Management of Patients with Thoracic Aortic Disease. ?Circulation. 2010; 121: E423-N361 ?6. Trace ascites. ? ?  ?12/07/2017 Imaging  ? CT CAP W Contrast 12/07/17 ?IMPRESSION: ?Chest Impression: ? 1. No evidence of thoracic metastasis. ?2. Stable ascending thoracic aortic aneurysm at 4.4 cm. Recommend ?attention on oncology surveillance. ? Abdomen / Pelvis Impression: ? 1. Mild interval increase in size of multifocal hepatic metastasis. ?2. Large central small bowel mesenteric mass is unchanged ?3. No bowel obstruction. ?4. No skeletal lesions ?  ? ?  ?06/12/2018 Imaging  ? 06/12/2018 CT AP ?IMPRESSION: ?Large mesenteric mass in the  right mid abdomen,  increased. ?  ?Multifocal hepatic metastases, increased. ?  ?Mild upper abdominal lymphadenopathy, mildly increased. ?  ?Masslike thickening involving the terminal ileum, suspicious for ?metastasis, similar to the prior. Additional nonspecific wall ?thickening involving multiple loops of small bowel in the pelvis. ?  ?Small volume pelvic ascites, mildly increased. ?  ?06/12/2018 Progression  ? IMPRESSION: ?Large mesenteric mass in the right mid abdomen, increased. ?  ?Multifocal hepatic metastases, increased. ?  ?Mild upper abdominal lymphadenopathy, mildly increased. ?  ?Masslike thickening involving the terminal ileum, suspicious for ?metastasis, similar to the prior. Additional nonspecific wall ?thickening involving multiple loops of small bowel in the pelvis. ?  ?Small volume pelvic ascites, mildly increased. ? ?  ?07/18/2018 PET scan  ? IMPRESSION: ?1. Examination is positive for extensive neuroendocrine tumor receptor avid metastatic disease involving the liver, axial and appendicular skeleton, thoracic and abdominal lymph nodes. There is a dominant mass within the mesentery which is intensely radiotracer ?avid with a maximum dimension of 11.9 cm and an SUV max of 44.32. Within the distal small bowel loops there is a radiotracer avid lesion likely within the distal ileum. ?2. When compared with CT from 06/13/2018 the tumor volume within the abdomen is not significantly changed in the interval. ?3.  Aortic Atherosclerosis (ICD10-I70.0). ?4. Coronary artery atherosclerotic calcifications ?5. Nonobstructing left renal calculi ? ?  ?08/21/2018 - 02/05/2019 Chemotherapy  ? Lutathera treatment with Sandostatin injection 08/21/18, 10/16/18, 12/11/18, 02/05/19 ? ?  ?03/12/2019 PET scan  ? IMPRESSION: ?1. Marked interval positive response to peptide receptor ?radiotherapy. ?2. Significant decrease in size and radiotracer activity within ?liver metastasis. ?3. Significant decrease in size and radiotracer  activity of large ?peritoneal mass in RIGHT lower quadrant. ?4. Decrease in size and radiotracer activity of retroperitoneal ?adenopathy in the upper abdomen. ?5. Decrease in size and radiotracer activity or resolution of ?activity of the small skeletal metastasis. ?  ?  ?05/22/2019 - 07/19/2019 Chemotherapy  ? Monthly Sandostatin injections restarted on 05/22/19-07/19/19. Did not improve symptoms of diarrhea, fatigue and did not improve which is likely related to his persistent high tumor burden. ? ?  ?06/17/2019 Imaging  ? CT AP W Contast  ?IMPRESSION: ?Multifocal hepatic metastases, unchanged from recent PET. ?  ?Mesenteric nodal metastasis in the right mid abdomen, unchanged from ?recent PET. ?  ?Stable wall thickening involving the distal ileum. ?  ?No focal osseous metastases are evident on CT. ?  ?Additional ancillary findings as above. ?  ?  ?09/17/2019 - 04/2020 Chemotherapy  ? Third line therapy oral Afinitor 7.'5mg'$  daily starting 09/17/19. Increased to full dose '10mg'$  starting 12/11/19. Afinitor held since 04/2020 due to LE edema and skin erythema. Now on observation ?  ?12/12/2019 Imaging  ? CT AP W Contrast ?IMPRESSION: ?1. Liver metastases are all mildly decreased. ?2. Conglomerate nodal mass in the right mesentery is mildly ?decreased. ?3. No new or progressive metastatic disease in the abdomen or ?pelvis. ?4. Chronic wall thickening in the distal and terminal ileum and ?cecum surrounding the ileocecal valve, unchanged. No evidence of ?bowel obstruction. ?5. Aortic Atherosclerosis (ICD10-I70.0). ?  ?12/04/2020 Imaging  ? CT CAP  ?IMPRESSION: ?1. Slight interval increase in size of a soft tissue mass in the ?right hemiabdomen mesentery, consistent with worsened mesenteric metastasis. ?2. Unchanged thickened, tethered appearance of the cecal base and terminal ileum. ?3. Multiple hypodense liver metastases are unchanged. ?4. No evidence of metastatic disease in the chest. No evidence of ?new metastatic disease in  the abdomen or pelvis. ?5. Unchanged enlargement  of the tubular ascending thoracic aorta ?measuring up to 4.3 x 4.2 cm. Recommend annual imaging followup by CTA or MRA. This recommendation follows 2010 ?[facility]/

## 2022-02-09 ENCOUNTER — Telehealth: Payer: Self-pay | Admitting: Physician Assistant

## 2022-02-09 ENCOUNTER — Other Ambulatory Visit: Payer: Self-pay

## 2022-02-09 ENCOUNTER — Inpatient Hospital Stay: Payer: Medicare Other | Attending: Physician Assistant | Admitting: Physician Assistant

## 2022-02-09 ENCOUNTER — Other Ambulatory Visit: Payer: Self-pay | Admitting: Physician Assistant

## 2022-02-09 ENCOUNTER — Inpatient Hospital Stay: Payer: Medicare Other

## 2022-02-09 ENCOUNTER — Other Ambulatory Visit: Payer: Self-pay | Admitting: Medical Oncology

## 2022-02-09 VITALS — BP 135/71 | HR 64 | Temp 97.9°F | Resp 15 | Wt 153.0 lb

## 2022-02-09 DIAGNOSIS — R0602 Shortness of breath: Secondary | ICD-10-CM

## 2022-02-09 DIAGNOSIS — C787 Secondary malignant neoplasm of liver and intrahepatic bile duct: Secondary | ICD-10-CM | POA: Insufficient documentation

## 2022-02-09 DIAGNOSIS — C772 Secondary and unspecified malignant neoplasm of intra-abdominal lymph nodes: Secondary | ICD-10-CM | POA: Diagnosis not present

## 2022-02-09 DIAGNOSIS — R197 Diarrhea, unspecified: Secondary | ICD-10-CM

## 2022-02-09 DIAGNOSIS — F419 Anxiety disorder, unspecified: Secondary | ICD-10-CM | POA: Diagnosis not present

## 2022-02-09 DIAGNOSIS — R5383 Other fatigue: Secondary | ICD-10-CM | POA: Diagnosis not present

## 2022-02-09 DIAGNOSIS — D500 Iron deficiency anemia secondary to blood loss (chronic): Secondary | ICD-10-CM

## 2022-02-09 DIAGNOSIS — A0472 Enterocolitis due to Clostridium difficile, not specified as recurrent: Secondary | ICD-10-CM | POA: Insufficient documentation

## 2022-02-09 DIAGNOSIS — C7951 Secondary malignant neoplasm of bone: Secondary | ICD-10-CM | POA: Diagnosis present

## 2022-02-09 DIAGNOSIS — R188 Other ascites: Secondary | ICD-10-CM | POA: Diagnosis not present

## 2022-02-09 DIAGNOSIS — A498 Other bacterial infections of unspecified site: Secondary | ICD-10-CM

## 2022-02-09 DIAGNOSIS — F32A Depression, unspecified: Secondary | ICD-10-CM | POA: Insufficient documentation

## 2022-02-09 DIAGNOSIS — Z79899 Other long term (current) drug therapy: Secondary | ICD-10-CM | POA: Diagnosis not present

## 2022-02-09 DIAGNOSIS — C7A029 Malignant carcinoid tumor of the large intestine, unspecified portion: Secondary | ICD-10-CM

## 2022-02-09 DIAGNOSIS — E860 Dehydration: Secondary | ICD-10-CM

## 2022-02-09 DIAGNOSIS — R509 Fever, unspecified: Secondary | ICD-10-CM | POA: Insufficient documentation

## 2022-02-09 DIAGNOSIS — R195 Other fecal abnormalities: Secondary | ICD-10-CM

## 2022-02-09 LAB — COMPREHENSIVE METABOLIC PANEL
ALT: 15 U/L (ref 0–44)
AST: 25 U/L (ref 15–41)
Albumin: 2.6 g/dL — ABNORMAL LOW (ref 3.5–5.0)
Alkaline Phosphatase: 77 U/L (ref 38–126)
Anion gap: 9 (ref 5–15)
BUN: 38 mg/dL — ABNORMAL HIGH (ref 8–23)
CO2: 20 mmol/L — ABNORMAL LOW (ref 22–32)
Calcium: 8.1 mg/dL — ABNORMAL LOW (ref 8.9–10.3)
Chloride: 110 mmol/L (ref 98–111)
Creatinine, Ser: 1.72 mg/dL — ABNORMAL HIGH (ref 0.61–1.24)
GFR, Estimated: 41 mL/min — ABNORMAL LOW (ref >60)
Glucose, Bld: 114 mg/dL — ABNORMAL HIGH (ref 70–99)
Potassium: 3.9 mmol/L (ref 3.5–5.1)
Sodium: 139 mmol/L (ref 135–145)
Total Bilirubin: 0.6 mg/dL (ref 0.3–1.2)
Total Protein: 6.5 g/dL (ref 6.5–8.1)

## 2022-02-09 LAB — CBC WITH DIFFERENTIAL/PLATELET
Abs Immature Granulocytes: 0.02 10*3/uL (ref 0.00–0.07)
Basophils Absolute: 0 10*3/uL (ref 0.0–0.1)
Basophils Relative: 1 %
Eosinophils Absolute: 0 10*3/uL (ref 0.0–0.5)
Eosinophils Relative: 1 %
HCT: 31.2 % — ABNORMAL LOW (ref 39.0–52.0)
Hemoglobin: 10 g/dL — ABNORMAL LOW (ref 13.0–17.0)
Immature Granulocytes: 1 %
Lymphocytes Relative: 6 %
Lymphs Abs: 0.2 10*3/uL — ABNORMAL LOW (ref 0.7–4.0)
MCH: 29.6 pg (ref 26.0–34.0)
MCHC: 32.1 g/dL (ref 30.0–36.0)
MCV: 92.3 fL (ref 80.0–100.0)
Monocytes Absolute: 0.3 10*3/uL (ref 0.1–1.0)
Monocytes Relative: 9 %
Neutro Abs: 3 10*3/uL (ref 1.7–7.7)
Neutrophils Relative %: 82 %
Platelets: 149 10*3/uL — ABNORMAL LOW (ref 150–400)
RBC: 3.38 MIL/uL — ABNORMAL LOW (ref 4.22–5.81)
RDW: 19.1 % — ABNORMAL HIGH (ref 11.5–15.5)
WBC: 3.6 10*3/uL — ABNORMAL LOW (ref 4.0–10.5)
nRBC: 0 % (ref 0.0–0.2)

## 2022-02-09 LAB — C DIFFICILE QUICK SCREEN W PCR REFLEX
C Diff antigen: POSITIVE — AB
C Diff interpretation: DETECTED
C Diff toxin: POSITIVE — AB

## 2022-02-09 MED ORDER — SODIUM CHLORIDE 0.9 % IV SOLN: Freq: Once | INTRAVENOUS | Status: AC

## 2022-02-09 MED ORDER — VANCOMYCIN HCL 125 MG PO CAPS: 125.0000 mg | ORAL_CAPSULE | Freq: Four times a day (QID) | ORAL | 0 refills | Status: DC

## 2022-02-09 MED ORDER — DENOSUMAB 120 MG/1.7ML ~~LOC~~ SOLN
120.0000 mg | Freq: Once | SUBCUTANEOUS | Status: AC
  Administered 2022-02-09: 120 mg via SUBCUTANEOUS
  Filled 2022-02-09: qty 1.7

## 2022-02-09 MED ORDER — DIPHENOXYLATE-ATROPINE 2.5-0.025 MG PO TABS: 1.0000 | ORAL_TABLET | Freq: Four times a day (QID) | ORAL | 0 refills | Status: AC | PRN

## 2022-02-09 MED ORDER — OCTREOTIDE ACETATE 30 MG IM KIT
30.0000 mg | PACK | Freq: Once | INTRAMUSCULAR | Status: AC
  Administered 2022-02-09: 30 mg via INTRAMUSCULAR
  Filled 2022-02-09: qty 1

## 2022-02-09 NOTE — Telephone Encounter (Signed)
Called patient again and left detailed voicemail regarding recommendations. See previous note for recommendations. Asked for call back to ensure message received and that he has no further questions.  ?

## 2022-02-09 NOTE — Progress Notes (Signed)
I attempted to call the patient as well as his sister regarding the results of his c. Diff testing. It came back positive. I was unable to reach them. I left my personal cell phone number and instructed them to call me back for further instructions. I will be sending in an antibiotic to the pharmacy. I also wanted to caution if he develops new or worsening symptoms including abdominal pain, weakness, fevers, etc to seek emergency evaluation. Also would refrain from using anti-diarrheal agents.  ?

## 2022-02-09 NOTE — Patient Instructions (Addendum)
Rehydration, Adult ?Rehydration is the replacement of body fluids, salts, and minerals (electrolytes) that are lost during dehydration. Dehydration is when there is not enough water or other fluids in the body. This happens when you lose more fluids than you take in. Common causes of dehydration include: ?Not drinking enough fluids. This can occur when you are ill or doing activities that require a lot of energy, especially in hot weather. ?Conditions that cause loss of water or other fluids, such as diarrhea, vomiting, sweating, or urinating a lot. ?Other illnesses, such as fever or infection. ?Certain medicines, such as those that remove excess fluid from the body (diuretics). ?Symptoms of mild or moderate dehydration may include thirst, dry lips and mouth, and dizziness. Symptoms of severe dehydration may include increased heart rate, confusion, fainting, and not urinating. ?For severe dehydration, you may need to get fluids through an IV at the hospital. For mild or moderate dehydration, you can usually rehydrate at home by drinking certain fluids as told by your health care provider. ?What are the risks? ?Generally, rehydration is safe. However, taking in too much fluid (overhydration) can be a problem. This is rare. Overhydration can cause an electrolyte imbalance, kidney failure, or a decrease in salt (sodium) levels in the body. ?Supplies needed ?You will need an oral rehydration solution (ORS) if your health care provider tells you to use one. This is a drink to treat dehydration. It can be found in pharmacies and retail stores. ?How to rehydrate ?Fluids ?Follow instructions from your health care provider for rehydration. The kind of fluid and the amount you should drink depend on your condition. In general, you should choose drinks that you prefer. ?If told by your health care provider, drink an ORS. ?Make an ORS by following instructions on the package. ?Start by drinking small amounts, about ? cup (120  mL) every 5-10 minutes. ?Slowly increase how much you drink until you have taken the amount recommended by your health care provider. ?Drink enough clear fluids to keep your urine pale yellow. If you were told to drink an ORS, finish it first, then start slowly drinking other clear fluids. Drink fluids such as: ?Water. This includes sparkling water and flavored water. Drinking only water can lead to having too little sodium in your body (hyponatremia). Follow the advice of your health care provider. ?Water from ice chips you suck on. ?Fruit juice with water you add to it (diluted). ?Sports drinks. ?Hot or cold herbal teas. ?Broth-based soups. ?Milk or milk products. ?Food ?Follow instructions from your health care provider about what to eat while you rehydrate. Your health care provider may recommend that you slowly begin eating regular foods in small amounts. ?Eat foods that contain a healthy balance of electrolytes, such as bananas, oranges, potatoes, tomatoes, and spinach. ?Avoid foods that are greasy or contain a lot of sugar. ?In some cases, you may get nutrition through a feeding tube that is passed through your nose and into your stomach (nasogastric tube, or NG tube). This may be done if you have uncontrolled vomiting or diarrhea. ?Beverages to avoid ? ?Certain beverages may make dehydration worse. While you rehydrate, avoid drinking alcohol. ?How to tell if you are recovering from dehydration ?You may be recovering from dehydration if: ?You are urinating more often than before you started rehydrating. ?Your urine is pale yellow. ?Your energy level improves. ?You vomit less frequently. ?You have diarrhea less frequently. ?Your appetite improves or returns to normal. ?You feel less dizzy or less light-headed. ?  Your skin tone and color start to look more normal. ?Follow these instructions at home: ?Take over-the-counter and prescription medicines only as told by your health care provider. ?Do not take sodium  tablets. Doing this can lead to having too much sodium in your body (hypernatremia). ?Contact a health care provider if: ?You continue to have symptoms of mild or moderate dehydration, such as: ?Thirst. ?Dry lips. ?Slightly dry mouth. ?Dizziness. ?Dark urine or less urine than normal. ?Muscle cramps. ?You continue to vomit or have diarrhea. ?Get help right away if you: ?Have symptoms of dehydration that get worse. ?Have a fever. ?Have a severe headache. ?Have been vomiting and the following happens: ?Your vomiting gets worse or does not go away. ?Your vomit includes blood or green matter (bile). ?You cannot eat or drink without vomiting. ?Have problems with urination or bowel movements, such as: ?Diarrhea that gets worse or does not go away. ?Blood in your stool (feces). This may cause stool to look black and tarry. ?Not urinating, or urinating only a small amount of very dark urine, within 6-8 hours. ?Have trouble breathing. ?Have symptoms that get worse with treatment. ?These symptoms may represent a serious problem that is an emergency. Do not wait to see if the symptoms will go away. Get medical help right away. Call your local emergency services (911 in the [place] Do not drive yourself to the hospital. ?Summary ?Rehydration is the replacement of body fluids and minerals (electrolytes) that are lost during dehydration. ?Follow instructions from your health care provider for rehydration. The kind of fluid and amount you should drink depend on your condition. ?Slowly increase how much you drink until you have taken the amount recommended by your health care provider. ?Contact your health care provider if you continue to show signs of mild or moderate dehydration. ?This information is not intended to replace advice given to you by your health care provider. Make sure you discuss any questions you have with your health care provider. ?Document Revised: 11/20/2019 Document Reviewed: 09/30/2019 ?Elsevier Patient  Education ? [place]. ?Octreotide injection solution ?What is this medication? ?OCTREOTIDE (ok TREE oh tide) is used to reduce blood levels of growth hormone in patients with a condition called acromegaly. This medicine also reduces flushing and watery diarrhea caused by certain types of cancer. ?This medicine may be used for other purposes; ask your health care provider or pharmacist if you have questions. ?COMMON BRAND NAME(S): Bynfezia, Sandostatin ?What should I tell my care team before I take this medication? ?They need to know if you have any of these conditions: ?diabetes ?gallbladder disease ?kidney disease ?liver disease ?thyroid disease ?an unusual or allergic reaction to octreotide, other medicines, foods, dyes, or preservatives ?pregnant or trying to get pregnant ?breast-feeding ?How should I use this medication? ?This medication is injected under the skin or into a vein. It is usually given by your care team in a hospital or clinic setting. ?If you get this medication at home, you will be taught how to prepare and give it. Use exactly as directed. Take it as directed on the prescription label at the same time every day. Keep taking it unless your care team tells you to stop. ?Allow the injection solution to come to room temperature before use. Do not warm it artificially. ?It is important that you put your used needles and syringes in a special sharps container. Do not put them in a trash can. If you do not have a sharps container, call your pharmacist  or care team to get one. ?Talk to your care team about the use of this medication in children. Special care may be needed. ?Overdosage: If you think you have taken too much of this medicine contact a poison control center or emergency room at once. ?NOTE: This medicine is only for you. Do not share this medicine with others. ?What if I miss a dose? ?If you miss a dose, take it as soon as you can. If it is almost time for your next dose, take only  that dose. Do not take double or extra doses. ?What may interact with this medication? ?bromocriptine ?certain medicines for blood pressure, heart disease, irregular heartbeat ?cyclosporine ?diuretics ?medi

## 2022-02-09 NOTE — Progress Notes (Signed)
Orders entered for NS/IVF for 02/11/22 ?

## 2022-02-09 NOTE — Telephone Encounter (Signed)
Spoke with Dr. Burr Medico, she recommends oral vancomycin. I have sent prescription. She would like IVF in isolation at the end of the day Friday for labs and follow up. If no improvement in his diarrhea, would recommend hospital evaluation/management. I will coordinate with infusion RN and [facility] on Friday for labs, follow up, and infusion in isolation. Still trying to get in touch with patient and his sister. My personal contact information was left on voicemail with instructions to call me back.  ?

## 2022-02-09 NOTE — Telephone Encounter (Signed)
Attempted again to call the patient as well as his sister. Unable to reach.  ?

## 2022-02-09 NOTE — Progress Notes (Signed)
Per Cassie, okay to discharge patient after all other patients have completed treatment and are discharged. ?

## 2022-02-09 NOTE — Patient Instructions (Addendum)
I know we covered a lot of important information at your appointment today.  He is the summary of the main plan moving forward: ?1) please stop taking Afinitor.  Dr. Burr Medico wanted you to stop taking this because she was concerned it was causing side effects and swelling. ?2) please purchase a pillbox that has morning, lunch, and dinner.  Please make sure you are taking your medications as prescribed.  Please set an alarm if you are sleeping through the time to take your medication. ?3) Please hydrate well at home.  Appears that you are dehydrated from diarrhea. ?4) Please make sure you are taking Xermelo as prescribed.  Please also make sure you are taking Imodium as prescribed.  To review, the instructions for Imodium are  Initial: 4 mg, followed by 2 mg every 2 to 4 hours or after each loose stool; up to a max dose of 16 mg daily.  ?5) I am also going to send a medication called Lomotil to the pharmacy.  If you continue to have persistent diarrhea despite taking Xermelo and Imodium then please also take Lomotil.  If you have adequate control of your diarrhea with Xermelo and Imodium then you do not need to take this. ?6) I have placed an order for a chest x-ray to evaluate your shortness of breath.  You can get this done at the radiology department whenever. ?7) it may not be a bad idea to consider palliative care/hospice.  Please discuss this with your family.  If you are interested please contact us and we would be happy to arrange for them to come to your house to speak with you. ?8) we are checking your stool today to make sure you do not have an infection.  We will contact you with the results. ?9) we will also arrange for paracentesis to make sure you do not have additional fluid in the belly.  Please panel look out for a phone call from interventional radiology. ?10) please keep your appointment with interventional radiology for the [place] treatment on 03/10/2022 ?11) please take your Lasix and  spironolactone help with fluid.  ?

## 2022-02-10 ENCOUNTER — Encounter: Payer: Medicare Other | Admitting: Physician Assistant

## 2022-02-10 ENCOUNTER — Other Ambulatory Visit: Payer: Medicare Other

## 2022-02-10 ENCOUNTER — Ambulatory Visit: Payer: Medicare Other

## 2022-02-10 LAB — GASTROINTESTINAL PANEL BY PCR, STOOL (REPLACES STOOL CULTURE)

## 2022-02-11 ENCOUNTER — Other Ambulatory Visit: Payer: Self-pay

## 2022-02-11 ENCOUNTER — Other Ambulatory Visit: Payer: Medicare Other

## 2022-02-11 ENCOUNTER — Inpatient Hospital Stay (HOSPITAL_BASED_OUTPATIENT_CLINIC_OR_DEPARTMENT_OTHER): Payer: Medicare Other | Admitting: Physician Assistant

## 2022-02-11 ENCOUNTER — Inpatient Hospital Stay: Payer: Medicare Other

## 2022-02-11 DIAGNOSIS — E860 Dehydration: Secondary | ICD-10-CM

## 2022-02-11 DIAGNOSIS — N179 Acute kidney failure, unspecified: Secondary | ICD-10-CM

## 2022-02-11 DIAGNOSIS — A498 Other bacterial infections of unspecified site: Secondary | ICD-10-CM

## 2022-02-11 DIAGNOSIS — C7951 Secondary malignant neoplasm of bone: Secondary | ICD-10-CM | POA: Diagnosis not present

## 2022-02-11 LAB — CMP (CANCER CENTER ONLY)
ALT: 13 U/L (ref 0–44)
AST: 21 U/L (ref 15–41)
Albumin: 3.1 g/dL — ABNORMAL LOW (ref 3.5–5.0)
Alkaline Phosphatase: 76 U/L (ref 38–126)
Anion gap: 9 (ref 5–15)
BUN: 32 mg/dL — ABNORMAL HIGH (ref 8–23)
CO2: 21 mmol/L — ABNORMAL LOW (ref 22–32)
Calcium: 7.9 mg/dL — ABNORMAL LOW (ref 8.9–10.3)
Chloride: 108 mmol/L (ref 98–111)
Creatinine: 1.61 mg/dL — ABNORMAL HIGH (ref 0.61–1.24)
GFR, Estimated: 44 mL/min — ABNORMAL LOW (ref >60)
Glucose, Bld: 116 mg/dL — ABNORMAL HIGH (ref 70–99)
Potassium: 4.2 mmol/L (ref 3.5–5.1)
Sodium: 138 mmol/L (ref 135–145)
Total Bilirubin: 0.3 mg/dL (ref 0.3–1.2)
Total Protein: 6.4 g/dL — ABNORMAL LOW (ref 6.5–8.1)

## 2022-02-11 LAB — CBC WITH DIFFERENTIAL (CANCER CENTER ONLY)
Abs Immature Granulocytes: 0.01 10*3/uL (ref 0.00–0.07)
Basophils Absolute: 0 10*3/uL (ref 0.0–0.1)
Basophils Relative: 1 %
Eosinophils Absolute: 0 10*3/uL (ref 0.0–0.5)
Eosinophils Relative: 1 %
HCT: 32 % — ABNORMAL LOW (ref 39.0–52.0)
Hemoglobin: 10.3 g/dL — ABNORMAL LOW (ref 13.0–17.0)
Immature Granulocytes: 0 %
Lymphocytes Relative: 5 %
Lymphs Abs: 0.2 10*3/uL — ABNORMAL LOW (ref 0.7–4.0)
MCH: 29.9 pg (ref 26.0–34.0)
MCHC: 32.2 g/dL (ref 30.0–36.0)
MCV: 93 fL (ref 80.0–100.0)
Monocytes Absolute: 0.4 10*3/uL (ref 0.1–1.0)
Monocytes Relative: 10 %
Neutro Abs: 3.2 10*3/uL (ref 1.7–7.7)
Neutrophils Relative %: 83 %
Platelet Count: 148 10*3/uL — ABNORMAL LOW (ref 150–400)
RBC: 3.44 MIL/uL — ABNORMAL LOW (ref 4.22–5.81)
RDW: 19.6 % — ABNORMAL HIGH (ref 11.5–15.5)
WBC Count: 3.8 10*3/uL — ABNORMAL LOW (ref 4.0–10.5)
nRBC: 0 % (ref 0.0–0.2)

## 2022-02-11 MED ORDER — SODIUM CHLORIDE 0.9 % IV SOLN: Freq: Once | INTRAVENOUS | Status: AC

## 2022-02-11 NOTE — Progress Notes (Signed)
? ? ? ?Symptom Management Consult note ?[place]   ? ?Patient Care Team: ?Leonard Downing, MD as PCP - General (Family Medicine)  ? ? ?Name of the patient: Ryan Cowan  [id number]  November 05, 1945  ? ?Date of visit: 02/11/2022  ? ? ?Chief complaint/ Reason for visit- IVF and labs ? ?Oncology History Overview Note  ?Cancer Staging ?Carcinoid tumor of colon, malignant ([place]) ?Staging form: Colon and Rectum, AJCC 7th Edition ?- Clinical: Stage Unknown ([place], N2b, M1) - Unsigned ? ? ? ?  ?Carcinoid tumor of colon, malignant ([place])  ?02/16/2015 Imaging  ? CT abdomen and pelvis with contrast showed wall thickening of the cecum and terminal ileum high suspicious for cecal neoplasm. Extensive confluent ileal colic adenopathy extending chronically along the superior mesenteric vein, multiple hepatic metastasis ? ?  ?02/18/2015 Initial Diagnosis  ? Carcinoid tumor of colon, malignant, with liver metastasis ? ?  ?02/18/2015 Initial Biopsy  ? 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 The tumor is positive for CD X2, CD 56, chromogranin, and synaptophysin. ? ?  ?03/05/2015 - 07/24/2018 Chemotherapy  ? Sandostatin 30 mg IM every 4 weeks. Due to disease progression regular monthly injections stopped after 07/24/18 ?  ?03/06/2015 Imaging  ? CT chest showed no evidence of metastasis in the chest. 4.4 cm ascending aortic aneurysm. ? ?  ?09/09/2015 Imaging  ? Restaging CT abdomen and pelvis with contrast showed interval response of hepatic metastasis. Most are small and, some are stable. Slight interval decrease in mesenteric adenopathy and primary colon tumor.  ? ?  ?12/06/2016 Imaging  ? CT CAP w contrast ?IMPRESSION: ?1. Overall, the extent of metastatic disease to the liver and ?abdominal/retroperitoneal lymphatics is similar to the prior ?examination 08/19/2016. 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 ?2.  Multiple small pulmonary nodules appear unchanged in size, number ?and distribution, favored to be benign. ?3. Aortic atherosclerosis, in addition to 2 vessel coronary artery ?disease. Assessment for potential risk factor modification, dietary ?therapy or pharmacologic therapy may be warranted, if clinically ?indicated. ?4. In addition, there is dilatation of the aortic root (4.7 cm in ?diameter at the level of the sinuses of Valsalva) and ectasia of the ?ascending thoracic aorta measuring 4.4 cm in diameter. Recommend ?annual imaging followup by CTA or MRA. This recommendation follows ?2010 ACCF/AHA/AATS/ACR/ASA/SCA/SCAI/SIR/STS/SVM Guidelines for the ?Diagnosis and Management of Patients with Thoracic Aortic Disease. ?Circulation. 2010; 121: C376-E831. ?5. Trace volume of ascites. ?6. Additional incidental findings, as above. ?  ? ?  ?05/31/2017 Imaging  ? CT CAP W Contrast 05/31/17 ?IMPRESSION: ?1. Overall the extent of metastatic disease to the liver and ?abdominal adenopathy is similar to previous exam from 12/06/2016. ?2. 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 ?3. Stable small pulmonary nodules. ?4.  Aortic Atherosclerosis (ICD10-I70.0). ?5. Dilatation of the ascending thoracic aorta. Unchanged. Recommend ?annual imaging followup by CTA or MRA. This recommendation follows ?2010 ACCF/AHA/AATS/ACR/ASA/SCA/SCAI/SIR/STS/SVM Guidelines for the ?Diagnosis and Management of Patients with Thoracic Aortic Disease. ?Circulation. 2010; 121: D176-H607 ?6. Trace ascites. ? ?  ?12/07/2017 Imaging  ? CT CAP W Contrast 12/07/17 ?IMPRESSION: ?Chest Impression: ? 1. No evidence of thoracic metastasis. ?2. Stable ascending thoracic aortic aneurysm at 4.4 cm. Recommend ?attention on oncology surveillance. ? Abdomen / Pelvis Impression: ? 1. Mild interval increase in size of multifocal hepatic metastasis. ?2. Large central small bowel mesenteric mass is  unchanged ?3. No  bowel obstruction. ?4. No skeletal lesions ?  ? ?  ?06/12/2018 Imaging  ? 06/12/2018 CT AP ?IMPRESSION: ?Large mesenteric mass in the right mid abdomen, increased. ?  ?Multifocal hepatic metastases, increased. ?  ?Mild upper abdominal lymphadenopathy, mildly increased. ?  ?Masslike thickening involving the terminal ileum, suspicious for ?metastasis, similar to the prior. Additional nonspecific wall ?thickening involving multiple loops of small bowel in the pelvis. ?  ?Small volume pelvic ascites, mildly increased. ?  ?06/12/2018 Progression  ? IMPRESSION: ?Large mesenteric mass in the right mid abdomen, increased. ?  ?Multifocal hepatic metastases, increased. ?  ?Mild upper abdominal lymphadenopathy, mildly increased. ?  ?Masslike thickening involving the terminal ileum, suspicious for ?metastasis, similar to the prior. Additional nonspecific wall ?thickening involving multiple loops of small bowel in the pelvis. ?  ?Small volume pelvic ascites, mildly increased. ? ?  ?07/18/2018 PET scan  ? IMPRESSION: ?1. Examination is positive for extensive neuroendocrine tumor receptor avid metastatic disease involving the liver, axial and appendicular skeleton, thoracic and abdominal lymph nodes. There is a dominant mass within the mesentery which is intensely radiotracer ?avid with a maximum dimension of 11.9 cm and an SUV max of 44.32. Within the distal small bowel loops there is a radiotracer avid lesion likely within the distal ileum. ?2. When compared with CT from 06/13/2018 the tumor volume within the abdomen is not significantly changed in the interval. ?3.  Aortic Atherosclerosis (ICD10-I70.0). ?4. Coronary artery atherosclerotic calcifications ?5. Nonobstructing left renal calculi ? ?  ?08/21/2018 - 02/05/2019 Chemotherapy  ? Lutathera treatment with Sandostatin injection 08/21/18, 10/16/18, 12/11/18, 02/05/19 ? ?  ?03/12/2019 PET scan  ? IMPRESSION: ?1. Marked interval positive response to peptide receptor ?radiotherapy. ?2.  Significant decrease in size and radiotracer activity within ?liver metastasis. ?3. Significant decrease in size and radiotracer activity of large ?peritoneal mass in RIGHT lower quadrant. ?4. Decrease in size and radiotracer activity of retroperitoneal ?adenopathy in the upper abdomen. ?5. Decrease in size and radiotracer activity or resolution of ?activity of the small skeletal metastasis. ?  ?  ?05/22/2019 - 07/19/2019 Chemotherapy  ? Monthly Sandostatin injections restarted on 05/22/19-07/19/19. Did not improve symptoms of diarrhea, fatigue and did not improve which is likely related to his persistent high tumor burden. ? ?  ?06/17/2019 Imaging  ? CT AP W Contast  ?IMPRESSION: ?Multifocal hepatic metastases, unchanged from recent PET. ?  ?Mesenteric nodal metastasis in the right mid abdomen, unchanged from ?recent PET. ?  ?Stable wall thickening involving the distal ileum. ?  ?No focal osseous metastases are evident on CT. ?  ?Additional ancillary findings as above. ?  ?  ?09/17/2019 - 04/2020 Chemotherapy  ? Third line therapy oral Afinitor 7.'5mg'$  daily starting 09/17/19. Increased to full dose '10mg'$  starting 12/11/19. Afinitor held since 04/2020 due to LE edema and skin erythema. Now on observation ?  ?12/12/2019 Imaging  ? CT AP W Contrast ?IMPRESSION: ?1. Liver metastases are all mildly decreased. ?2. Conglomerate nodal mass in the right mesentery is mildly ?decreased. ?3. No new or progressive metastatic disease in the abdomen or ?pelvis. ?4. Chronic wall thickening in the distal and terminal ileum and ?cecum surrounding the ileocecal valve, unchanged. No evidence of ?bowel obstruction. ?5. Aortic Atherosclerosis (ICD10-I70.0). ?  ?12/04/2020 Imaging  ? CT CAP  ?IMPRESSION: ?1. Slight interval increase in size of a soft tissue mass in the ?right hemiabdomen mesentery, consistent with worsened mesenteric metastasis. ?2. Unchanged thickened, tethered appearance of the cecal base and terminal ileum. ?  3. Multiple  hypodense liver metastases are unchanged. ?4. No evidence of metastatic disease in the chest. No evidence of ?new metastatic disease in the abdomen or pelvis. ?5. Unchanged enlargement of the tubular ascending th

## 2022-02-11 NOTE — Patient Instructions (Signed)
Rehydration, Adult Rehydration is the replacement of body fluids, salts, and minerals (electrolytes) that are lost during dehydration. Dehydration is when there is not enough water or other fluids in the body. This happens when you lose more fluids than you take in. Common causes of dehydration include: Not drinking enough fluids. This can occur when you are ill or doing activities that require a lot of energy, especially in hot weather. Conditions that cause loss of water or other fluids, such as diarrhea, vomiting, sweating, or urinating a lot. Other illnesses, such as fever or infection. Certain medicines, such as those that remove excess fluid from the body (diuretics). Symptoms of mild or moderate dehydration may include thirst, dry lips and mouth, and dizziness. Symptoms of severe dehydration may include increased heart rate, confusion, fainting, and not urinating. For severe dehydration, you may need to get fluids through an IV at the hospital. For mild or moderate dehydration, you can usually rehydrate at home by drinking certain fluids as told by your health care provider. What are the risks? Generally, rehydration is safe. However, taking in too much fluid (overhydration) can be a problem. This is rare. Overhydration can cause an electrolyte imbalance, kidney failure, or a decrease in salt (sodium) levels in the body. Supplies needed You will need an oral rehydration solution (ORS) if your health care provider tells you to use one. This is a drink to treat dehydration. It can be found in pharmacies and retail stores. How to rehydrate Fluids Follow instructions from your health care provider for rehydration. The kind of fluid and the amount you should drink depend on your condition. In general, you should choose drinks that you prefer. If told by your health care provider, drink an ORS. Make an ORS by following instructions on the package. Start by drinking small amounts, about  cup (120  mL) every 5-10 minutes. Slowly increase how much you drink until you have taken the amount recommended by your health care provider. Drink enough clear fluids to keep your urine pale yellow. If you were told to drink an ORS, finish it first, then start slowly drinking other clear fluids. Drink fluids such as: Water. This includes sparkling water and flavored water. Drinking only water can lead to having too little sodium in your body (hyponatremia). Follow the advice of your health care provider. Water from ice chips you suck on. Fruit juice with water you add to it (diluted). Sports drinks. Hot or cold herbal teas. Broth-based soups. Milk or milk products. Food Follow instructions from your health care provider about what to eat while you rehydrate. Your health care provider may recommend that you slowly begin eating regular foods in small amounts. Eat foods that contain a healthy balance of electrolytes, such as bananas, oranges, potatoes, tomatoes, and spinach. Avoid foods that are greasy or contain a lot of sugar. In some cases, you may get nutrition through a feeding tube that is passed through your nose and into your stomach (nasogastric tube, or NG tube). This may be done if you have uncontrolled vomiting or diarrhea. Beverages to avoid  Certain beverages may make dehydration worse. While you rehydrate, avoid drinking alcohol. How to tell if you are recovering from dehydration You may be recovering from dehydration if: You are urinating more often than before you started rehydrating. Your urine is pale yellow. Your energy level improves. You vomit less frequently. You have diarrhea less frequently. Your appetite improves or returns to normal. You feel less dizzy or less light-headed.   Your skin tone and color start to look more normal. Follow these instructions at home: Take over-the-counter and prescription medicines only as told by your health care provider. Do not take sodium  tablets. Doing this can lead to having too much sodium in your body (hypernatremia). Contact a health care provider if: You continue to have symptoms of mild or moderate dehydration, such as: Thirst. Dry lips. Slightly dry mouth. Dizziness. Dark urine or less urine than normal. Muscle cramps. You continue to vomit or have diarrhea. Get help right away if you: Have symptoms of dehydration that get worse. Have a fever. Have a severe headache. Have been vomiting and the following happens: Your vomiting gets worse or does not go away. Your vomit includes blood or green matter (bile). You cannot eat or drink without vomiting. Have problems with urination or bowel movements, such as: Diarrhea that gets worse or does not go away. Blood in your stool (feces). This may cause stool to look black and tarry. Not urinating, or urinating only a small amount of very dark urine, within 6-8 hours. Have trouble breathing. Have symptoms that get worse with treatment. These symptoms may represent a serious problem that is an emergency. Do not wait to see if the symptoms will go away. Get medical help right away. Call your local emergency services (911 in the [place] Do not drive yourself to the hospital. Summary Rehydration is the replacement of body fluids and minerals (electrolytes) that are lost during dehydration. Follow instructions from your health care provider for rehydration. The kind of fluid and amount you should drink depend on your condition. Slowly increase how much you drink until you have taken the amount recommended by your health care provider. Contact your health care provider if you continue to show signs of mild or moderate dehydration. This information is not intended to replace advice given to you by your health care provider. Make sure you discuss any questions you have with your health care provider. Document Revised: 11/20/2019 Document Reviewed: 09/30/2019 Elsevier Patient  Education  2023 [employer]  

## 2022-02-11 NOTE — Patient Instructions (Addendum)
I called the pharmacy and they are filling your vancomycin prescription so please pick it up from the pharmacy and start taking it.  This will definitely help with your diarrhea ? ? ?Your paracentesis (removal of the fluid from your abdomen) has been scheduled for May 30 at 2 PM. ? ? ? ? ?

## 2022-02-14 ENCOUNTER — Inpatient Hospital Stay (HOSPITAL_COMMUNITY)
Admission: EM | Admit: 2022-02-14 | Discharge: 2022-02-20 | DRG: 371 | Disposition: A | Discharge status: Skilled Nursing Facility | Payer: Medicare Other | Source: Ambulatory Visit | Attending: Internal Medicine | Admitting: Internal Medicine

## 2022-02-14 ENCOUNTER — Encounter (HOSPITAL_COMMUNITY): Payer: Self-pay | Admitting: Emergency Medicine

## 2022-02-14 ENCOUNTER — Telehealth: Payer: Self-pay | Admitting: Hematology

## 2022-02-14 ENCOUNTER — Other Ambulatory Visit: Payer: Self-pay

## 2022-02-14 ENCOUNTER — Observation Stay (HOSPITAL_COMMUNITY): Payer: Medicare Other

## 2022-02-14 ENCOUNTER — Emergency Department (HOSPITAL_COMMUNITY): Payer: Medicare Other

## 2022-02-14 DIAGNOSIS — Z91148 Patient's other noncompliance with medication regimen for other reason: Secondary | ICD-10-CM

## 2022-02-14 DIAGNOSIS — R180 Malignant ascites: Secondary | ICD-10-CM | POA: Diagnosis present

## 2022-02-14 DIAGNOSIS — C7A029 Malignant carcinoid tumor of the large intestine, unspecified portion: Secondary | ICD-10-CM | POA: Diagnosis present

## 2022-02-14 DIAGNOSIS — C7A022 Malignant carcinoid tumor of the ascending colon: Secondary | ICD-10-CM

## 2022-02-14 DIAGNOSIS — E43 Unspecified severe protein-calorie malnutrition: Secondary | ICD-10-CM | POA: Diagnosis present

## 2022-02-14 DIAGNOSIS — E8729 Other acidosis: Secondary | ICD-10-CM | POA: Diagnosis present

## 2022-02-14 DIAGNOSIS — R634 Abnormal weight loss: Secondary | ICD-10-CM | POA: Diagnosis present

## 2022-02-14 DIAGNOSIS — C7B00 Secondary carcinoid tumors, unspecified site: Secondary | ICD-10-CM

## 2022-02-14 DIAGNOSIS — E46 Unspecified protein-calorie malnutrition: Secondary | ICD-10-CM

## 2022-02-14 DIAGNOSIS — R64 Cachexia: Secondary | ICD-10-CM | POA: Diagnosis present

## 2022-02-14 DIAGNOSIS — Z885 Allergy status to narcotic agent status: Secondary | ICD-10-CM

## 2022-02-14 DIAGNOSIS — N1832 Chronic kidney disease, stage 3b: Secondary | ICD-10-CM

## 2022-02-14 DIAGNOSIS — Z79890 Hormone replacement therapy (postmenopausal): Secondary | ICD-10-CM

## 2022-02-14 DIAGNOSIS — N1831 Chronic kidney disease, stage 3a: Secondary | ICD-10-CM | POA: Diagnosis present

## 2022-02-14 DIAGNOSIS — D649 Anemia, unspecified: Secondary | ICD-10-CM | POA: Diagnosis present

## 2022-02-14 DIAGNOSIS — Z66 Do not resuscitate: Secondary | ICD-10-CM | POA: Diagnosis present

## 2022-02-14 DIAGNOSIS — Z87891 Personal history of nicotine dependence: Secondary | ICD-10-CM

## 2022-02-14 DIAGNOSIS — Z85038 Personal history of other malignant neoplasm of large intestine: Secondary | ICD-10-CM

## 2022-02-14 DIAGNOSIS — E878 Other disorders of electrolyte and fluid balance, not elsewhere classified: Secondary | ICD-10-CM | POA: Diagnosis present

## 2022-02-14 DIAGNOSIS — N183 Chronic kidney disease, stage 3 unspecified: Secondary | ICD-10-CM

## 2022-02-14 DIAGNOSIS — N179 Acute kidney failure, unspecified: Secondary | ICD-10-CM | POA: Diagnosis present

## 2022-02-14 DIAGNOSIS — R627 Adult failure to thrive: Secondary | ICD-10-CM | POA: Diagnosis present

## 2022-02-14 DIAGNOSIS — C7B02 Secondary carcinoid tumors of liver: Secondary | ICD-10-CM | POA: Diagnosis present

## 2022-02-14 DIAGNOSIS — A0472 Enterocolitis due to Clostridium difficile, not specified as recurrent: Principal | ICD-10-CM | POA: Diagnosis present

## 2022-02-14 DIAGNOSIS — Z6820 Body mass index (BMI) 20.0-20.9, adult: Secondary | ICD-10-CM

## 2022-02-14 DIAGNOSIS — E860 Dehydration: Secondary | ICD-10-CM | POA: Diagnosis present

## 2022-02-14 DIAGNOSIS — E8809 Other disorders of plasma-protein metabolism, not elsewhere classified: Secondary | ICD-10-CM | POA: Diagnosis present

## 2022-02-14 DIAGNOSIS — C7B03 Secondary carcinoid tumors of bone: Secondary | ICD-10-CM | POA: Diagnosis present

## 2022-02-14 DIAGNOSIS — Z79899 Other long term (current) drug therapy: Secondary | ICD-10-CM

## 2022-02-14 DIAGNOSIS — D500 Iron deficiency anemia secondary to blood loss (chronic): Secondary | ICD-10-CM | POA: Diagnosis not present

## 2022-02-14 DIAGNOSIS — Z888 Allergy status to other drugs, medicaments and biological substances status: Secondary | ICD-10-CM

## 2022-02-14 DIAGNOSIS — R54 Age-related physical debility: Secondary | ICD-10-CM | POA: Diagnosis present

## 2022-02-14 DIAGNOSIS — E039 Hypothyroidism, unspecified: Secondary | ICD-10-CM | POA: Diagnosis present

## 2022-02-14 DIAGNOSIS — C7A8 Other malignant neuroendocrine tumors: Secondary | ICD-10-CM | POA: Diagnosis present

## 2022-02-14 LAB — CBC
HCT: 31.3 % — ABNORMAL LOW (ref 39.0–52.0)
Hemoglobin: 10.1 g/dL — ABNORMAL LOW (ref 13.0–17.0)
MCH: 31 pg (ref 26.0–34.0)
MCHC: 32.3 g/dL (ref 30.0–36.0)
MCV: 96 fL (ref 80.0–100.0)
Platelets: 163 10*3/uL (ref 150–400)
RBC: 3.26 MIL/uL — ABNORMAL LOW (ref 4.22–5.81)
RDW: 19.6 % — ABNORMAL HIGH (ref 11.5–15.5)
WBC: 3.3 10*3/uL — ABNORMAL LOW (ref 4.0–10.5)
nRBC: 0 % (ref 0.0–0.2)

## 2022-02-14 LAB — COMPREHENSIVE METABOLIC PANEL
ALT: 16 U/L (ref 0–44)
AST: 22 U/L (ref 15–41)
Albumin: 2.6 g/dL — ABNORMAL LOW (ref 3.5–5.0)
Alkaline Phosphatase: 73 U/L (ref 38–126)
Anion gap: 10 (ref 5–15)
BUN: 27 mg/dL — ABNORMAL HIGH (ref 8–23)
CO2: 18 mmol/L — ABNORMAL LOW (ref 22–32)
Calcium: 8.2 mg/dL — ABNORMAL LOW (ref 8.9–10.3)
Chloride: 110 mmol/L (ref 98–111)
Creatinine, Ser: 1.48 mg/dL — ABNORMAL HIGH (ref 0.61–1.24)
GFR, Estimated: 49 mL/min — ABNORMAL LOW (ref >60)
Glucose, Bld: 109 mg/dL — ABNORMAL HIGH (ref 70–99)
Potassium: 4.5 mmol/L (ref 3.5–5.1)
Sodium: 138 mmol/L (ref 135–145)
Total Bilirubin: 0.7 mg/dL (ref 0.3–1.2)
Total Protein: 6.2 g/dL — ABNORMAL LOW (ref 6.5–8.1)

## 2022-02-14 LAB — LIPASE, BLOOD: Lipase: 40 U/L (ref 11–51)

## 2022-02-14 MED ORDER — VANCOMYCIN HCL 125 MG PO CAPS: 125.0000 mg | ORAL_CAPSULE | Freq: Four times a day (QID) | ORAL | Status: DC

## 2022-02-14 MED ORDER — VANCOMYCIN HCL 125 MG PO CAPS
125.0000 mg | ORAL_CAPSULE | Freq: Four times a day (QID) | ORAL | Status: DC
  Filled 2022-02-14: qty 1

## 2022-02-14 MED ORDER — TELOTRISTAT ETHYL(AS ETIPRATE) 250 MG PO TABS: 250.0000 mg | ORAL_TABLET | Freq: Three times a day (TID) | ORAL | Status: DC

## 2022-02-14 MED ORDER — SERTRALINE HCL 100 MG PO TABS
100.0000 mg | ORAL_TABLET | Freq: Every day | ORAL | Status: DC
  Administered 2022-02-15 – 2022-02-20 (×6): 100 mg via ORAL
  Filled 2022-02-14 (×6): qty 1

## 2022-02-14 MED ORDER — ACETAMINOPHEN 650 MG RE SUPP: 650.0000 mg | Freq: Four times a day (QID) | RECTAL | Status: DC | PRN

## 2022-02-14 MED ORDER — VANCOMYCIN HCL 125 MG PO CAPS
125.0000 mg | ORAL_CAPSULE | Freq: Four times a day (QID) | ORAL | Status: DC
  Administered 2022-02-14 – 2022-02-16 (×10): 125 mg via ORAL
  Filled 2022-02-14 (×12): qty 1

## 2022-02-14 MED ORDER — LEVOTHYROXINE SODIUM 100 MCG PO TABS
100.0000 ug | ORAL_TABLET | Freq: Every day | ORAL | Status: DC
  Administered 2022-02-15: 100 ug via ORAL
  Filled 2022-02-14: qty 1

## 2022-02-14 MED ORDER — SODIUM CHLORIDE 0.9 % IV SOLN
75.0000 mL/h | INTRAVENOUS | Status: AC
  Administered 2022-02-14: 75 mL/h via INTRAVENOUS

## 2022-02-14 MED ORDER — SODIUM CHLORIDE 0.9 % IV BOLUS
1000.0000 mL | Freq: Once | INTRAVENOUS | Status: AC
  Administered 2022-02-14: 1000 mL via INTRAVENOUS

## 2022-02-14 MED ORDER — POTASSIUM CHLORIDE CRYS ER 20 MEQ PO TBCR
20.0000 meq | EXTENDED_RELEASE_TABLET | Freq: Two times a day (BID) | ORAL | Status: DC
  Administered 2022-02-15 – 2022-02-20 (×11): 20 meq via ORAL
  Filled 2022-02-14 (×11): qty 1

## 2022-02-14 MED ORDER — IOHEXOL 300 MG/ML  SOLN
100.0000 mL | Freq: Once | INTRAMUSCULAR | Status: AC | PRN
  Administered 2022-02-14: 80 mL via INTRAVENOUS

## 2022-02-14 MED ORDER — ACETAMINOPHEN 325 MG PO TABS
650.0000 mg | ORAL_TABLET | Freq: Four times a day (QID) | ORAL | Status: DC | PRN
  Administered 2022-02-15 – 2022-02-18 (×2): 650 mg via ORAL
  Filled 2022-02-14 (×2): qty 2

## 2022-02-14 NOTE — Assessment & Plan Note (Signed)
Stable  monitor CBC

## 2022-02-14 NOTE — Assessment & Plan Note (Signed)
Oncology is aware will see in AM ?

## 2022-02-14 NOTE — H&P (Signed)
? ? Ryan Cowan DGL:[id number] DOB: May 13, 1946 DOA: 02/14/2022 ? ? ?  ?PCP: Leonard Downing, MD   ?Outpatient Specialists:   ?  ? Oncology  Dr.Feng ?  ? ?Patient arrived to ER on 02/14/22 at 1334 ?Referred by Attending Drenda Freeze, MD ? ? ?Patient coming from:   ? home Lives  alone ?  ? ?Chief Complaint:   ?Chief Complaint  ?Patient presents with  ? Diarrhea  ? ? ?HPI: ?Ryan Cowan is a 76 y.o. male with medical history significant of  metastatic carcinoid tumor, with ascites  ? C.diff, hypothyroidism, CKD ? ?Presented with   persistent dairrhea ?Pt has metastatic Carcinoid tumor and ascites ?Has chronic diarrhea ?For 1 wk ?Has been diagnosed w C.diff on 10th ?Started on PO vanc ?But has constant diarrhea ? Poor appetite ?His ascites is controlled with multiple paracentesis ? oral vancomycin 125 mg QID  ? Reports unstable on his feet ?Does not smoke or drink ?Family reports some memory issues ? ?  ?Regarding pertinent Chronic problems:   ?Carcinoid tumor - followed by Dr. Burr Medico ?   ?Hypothyroidism on Synthroid ? ? 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 ? ?Lab Results  ?Component Value Date  ? CREATININE 1.48 (H) 02/14/2022  ? CREATININE 1.61 (H) 02/11/2022  ? CREATININE 1.72 (H) 02/09/2022  ?  ?Chronic anemia - baseline hg Hemoglobin & Hematocrit  ?Recent Labs  ?  02/09/22 ?1425 02/11/22 ?1336 02/14/22 ?1458  ?HGB 10.0* 10.3* 10.1*  ?  ? ?While in ER: ?  ?Cr seem stable ? ?HG stable ?  ? ?CXR -  NON acute ? ?CTabd/pelvis - There is massive ascites which may suggest peritoneal metastatic ?disease or cirrhosis ?There is 4.8 x 3.2 cm soft tissue mass in the mesentery in the right ?lower abdomen suggesting possible metastatic disease with no ?significant interval change ? ?intestinal obstruction or pneumoperitoneum. ?There is no hydronephrosis. ?  ? ?Following Medications were ordered in ER: ?Medications   ?vancomycin (VANCOCIN) capsule 125 mg (125 mg Oral Given 02/14/22 1806)  ?sodium chloride 0.9 % bolus 1,000 mL (0 mLs Intravenous Stopped 02/14/22 1807)  ?iohexol (OMNIPAQUE) 300 MG/ML solution 100 mL (80 mLs Intravenous Contrast Given 02/14/22 1722)  ?  ?_______________________________________________________ ?ER Provider Called:     Dr. Burr Medico ?They Recommend admit to medicine   ?Will see in AM    ? ? ER Provider Called:   ID  Dr. Nyra Capes ?They Recommend admit to medicine   ?Continue PO vanco ? ?ED Triage Vitals  ?Enc Vitals Group  ?   BP 02/14/22 1425 (!) 127/49  ?   Pulse Rate 02/14/22 1425 (!) 52  ?   Resp 02/14/22 1425 16  ?   Temp 02/14/22 1425 97.8 ?F (36.6 ?C)  ?   Temp Source 02/14/22 1425 Oral  ?   SpO2 02/14/22 1425 100 %  ?   Weight 02/14/22 1444 153 lb (69.4 kg)  ?   Height 02/14/22 1444 '6\' 1"'$  (1.854 m)  ?   Head Circumference --   ?   Peak Flow --   ?   Pain Score 02/14/22 1442 4  ?   Pain Loc --   ?   Pain Edu? --   ?   Excl. in [place]? --   ?JJOA(41)@    ? _________________________________________ ?Significant initial  Findings: ?Abnormal Labs Reviewed  ?COMPREHENSIVE METABOLIC PANEL -  Abnormal; Notable for the following components:  ?    Result Value  ? CO2 18 (*)   ? Glucose, Bld 109 (*)   ? BUN 27 (*)   ? Creatinine, Ser 1.48 (*)   ? Calcium 8.2 (*)   ? Total Protein 6.2 (*)   ? Albumin 2.6 (*)   ? GFR, Estimated 49 (*)   ? All other components within normal limits  ?CBC - Abnormal; Notable for the following components:  ? WBC 3.3 (*)   ? RBC 3.26 (*)   ? Hemoglobin 10.1 (*)   ? HCT 31.3 (*)   ? RDW 19.6 (*)   ? All other components within normal limits  ? ?  ?_________________________ ?Troponin   ?ECG: Ordered ?Personally reviewed by me showing: ?HR : 60 ?Rhythm:Low voltage QRS ?Borderline ECG ?QTC 438 ?  ? ?The recent clinical data is shown below. ?Vitals:  ? 02/14/22 1800 02/14/22 1830 02/14/22 1900 02/14/22 1930  ?BP: (!) 146/67 135/70 (!) 141/67 138/64  ?Pulse: 72 64 63 67  ?Resp: '16 16 18 16  '$ ?Temp:       ?TempSrc:      ?SpO2: 99% 100% 100% 96%  ?Weight:      ?Height:      ? ?  ?  ?WBC ? ?   ?Component Value Date/Time  ? WBC 3.3 (L) 02/14/2022 1458  ? LYMPHSABS 0.2 (L) 02/11/2022 1336  ? LYMPHSABS 1.0 09/08/2017 1243  ? MONOABS 0.4 02/11/2022 1336  ? MONOABS 0.7 09/08/2017 1243  ? EOSABS 0.0 02/11/2022 1336  ? EOSABS 0.1 09/08/2017 1243  ? BASOSABS 0.0 02/11/2022 1336  ? BASOSABS 0.1 09/08/2017 1243  ? ?   ? ?Results for orders placed or performed in visit on 02/09/22  ?C difficile quick screen w PCR reflex     Status: Abnormal  ? Collection Time: 02/09/22  4:31 PM  ? Specimen: STOOL  ?Result Value Ref Range Status  ? C Diff antigen POSITIVE (A) NEGATIVE Final  ? C Diff toxin POSITIVE (A) NEGATIVE Final  ? C Diff interpretation Toxin producing C. difficile detected.  Final  ?  Comment: CRITICAL RESULT CALLED TO, READ BACK BY AND VERIFIED WITH: ?NORMAN,A. RN '@[id number]'$  ON 02/09/2022 BY COHEN,K ?Performed at [facility], [place] [address], [place], [place] [address] ?  ? ? ? ?_______________________________________________ ?Hospitalist was called for admission for   Malignant ascites  ?Metastatic carcinoid tumor ([place]) ?  ? ?C. difficile colitis ?   ?The following Work up has been ordered so far: ? ?Orders Placed This Encounter  ?Procedures  ? CT ABDOMEN PELVIS W CONTRAST  ? Lipase, blood  ? Comprehensive metabolic panel  ? CBC  ? Urinalysis, Routine w reflex microscopic  ? Diet NPO time specified  ? Consult to hospitalist  ? Contact ([place]) / Contact Isolation: Enteric  ?  ? ?OTHER Significant initial  Findings: ? ?labs showing: ? ?  ?Recent Labs  ?Lab 02/09/22 ?1425 02/11/22 ?1336 02/14/22 ?1458  ?NA 139 138 138  ?K 3.9 4.2 4.5  ?CO2 20* 21* 18*  ?GLUCOSE 114* 116* 109*  ?BUN 38* 32* 27*  ?CREATININE 1.72* 1.61* 1.48*  ?CALCIUM 8.1* 7.9* 8.2*  ? ? ?Cr   stable,    ?Lab Results  ?Component Value Date  ? CREATININE 1.48 (H) 02/14/2022  ? CREATININE 1.61 (H) 02/11/2022  ? CREATININE 1.72 (H) 02/09/2022   ? ? ?Recent Labs  ?Lab 02/09/22 ?1425 02/11/22 ?1336 02/14/22 ?1458  ?AST  $'25 21 22  'R$ ?ALT '15 13 16  '$ ?ALKPHOS 77 76 73  ?BILITOT 0.6 0.3 0.7  ?PROT 6.5 6.4* 6.2*  ?ALBUMIN 2.6* 3.1* 2.6*  ? ?Lab Results  ?Component Value Date  ? CALCIUM 8.2 (L) 02/14/2022  ? ?    ?   ?Plt: ?Lab Results  ?Component Value Date  ? PLT 163 02/14/2022  ? ? ?  ?   ?Recent Labs  ?Lab 02/09/22 ?1425 02/11/22 ?1336 02/14/22 ?1458  ?WBC 3.6* 3.8* 3.3*  ?NEUTROABS 3.0 3.2  --   ?HGB 10.0* 10.3* 10.1*  ?HCT 31.2* 32.0* 31.3*  ?MCV 92.3 93.0 96.0  ?PLT 149* 148* 163  ? ? ?HG/HCT  stable,   ?   ?Component Value Date/Time  ? HGB 10.1 (L) 02/14/2022 1458  ? HGB 10.3 (L) 02/11/2022 1336  ? HGB 13.0 09/08/2017 1243  ? HCT 31.3 (L) 02/14/2022 1458  ? HCT 39.0 09/08/2017 1243  ? MCV 96.0 02/14/2022 1458  ? MCV 92.6 09/08/2017 1243  ? ?  ?Recent Labs  ?Lab 02/14/22 ?1458  ?LIPASE 40  ?   ?    ?Cultures: ?   ?Component Value Date/Time  ? SDES STOOL 02/19/2015 1318  ? [place] NONE 02/19/2015 1318  ? REPTSTATUS 02/20/2015 FINAL 02/19/2015 1318  ? ?  ?Radiological Exams on Admission: ?CT ABDOMEN PELVIS W CONTRAST ? ?Result Date: 02/14/2022 ?CLINICAL DATA:  Diarrhea, nausea, vomiting, history of colon carcinoma EXAM: CT ABDOMEN AND PELVIS WITH CONTRAST TECHNIQUE: Multidetector CT imaging of the abdomen and pelvis was performed using the standard protocol following bolus administration of intravenous contrast. RADIATION DOSE REDUCTION: This exam was performed according to the departmental dose-optimization program which includes automated exposure control, adjustment of the mA and/or kV according to patient size and/or use of iterative reconstruction technique. CONTRAST:  41m OMNIPAQUE IOHEXOL 300 MG/ML  SOLN COMPARISON:  06/08/2021 FINDINGS: Lower chest: No significant abnormality is seen in the visualized lower lung fields. Hepatobiliary: There are multiple hypervascular lesions in the liver largest measuring 2.3 cm in size. There is interval appearance  of new enhancing lesion with central low attenuation in the inferior aspect of right lobe of liver. There is 12 mm low-density lesion in the left lobe, possibly a cyst or hemangioma. There is mild nodularity

## 2022-02-14 NOTE — Assessment & Plan Note (Addendum)
Check prealbumin order nutritional consult check electrolytes   ?

## 2022-02-14 NOTE — Progress Notes (Signed)
Manufacturing engineer [facility]) Hospital Liaison Note ? ?This is a pending hospice patient with [facility]. We will follow while hospitalized. Please call with any questions or concerns. Thank you ? ?Roselee Nova, LCSW ?[facility] Liaison  ?[phone] ?

## 2022-02-14 NOTE — Telephone Encounter (Signed)
I called pt. patient was seen by our nurse petitioner twice last week for C. difficile colitis, and hydration.  He has been taking oral vancomycin (he probably missed some dose) since 02/11/2022, but his diarrhea has not improved much, he is taking and drinking some, but may not be enough. No fever. He lives alone, and has hard time to take care of himself. I recommend him to come in to [facility] ED today for inpt management of his c-diff colitis, and we will discuss hospice care on discharge due to his overall rapid deterioration lately.  He is in agreement with the plan, he will call his sister to see if they can bring him in, otherwise he will call 911.  I will follow-up when he is in the hospital. ? ?Ryan Cowan  ?02/14/2022 12:27 PM  ?

## 2022-02-14 NOTE — Subjective & Objective (Signed)
Pt has metastatic Carcinoid tumor and ascites ?Has chronic diarrhea ?For 1 wk ?Has been diagnosed w C.diff on 10th ?Started on PO vanc ?But has constant diarrhea ? Poor appetite ?His ascites is controlled with multiple paracentesis ?

## 2022-02-14 NOTE — ED Provider Triage Note (Signed)
Emergency Medicine Provider Triage Evaluation Note ? ?Ryan Cowan , a 76 y.o. male  was evaluated in triage.  Pt complains of diarrhea, hx c. Diff, on PO vanc but not improving. Colon CA, mets to liver and bone, scheduled to see hospice at 2pm today but advised to come to the ER per oncology. ?No current chemo. ?Review of Systems  ?Positive: diarrhea ?Negative: Fever, no change in urine output. ? ?Physical Exam  ?BP (!) 127/49   Pulse (!) 52   Temp 97.8 ?F (36.6 ?C) (Oral)   Resp 16   Ht '6\' 1"'$  (1.854 m)   Wt 69.4 kg   SpO2 100%   BMI 20.19 kg/m?  ?Gen:   Awake, no distress   ?Resp:  Normal effort  ?MSK:   Moves extremities without difficulty  ?Other:   ? ?Medical Decision Making  ?Medically screening exam initiated at 3:15 PM.  Appropriate orders placed.  Ryan Cowan was informed that the remainder of the evaluation will be completed by another provider, this initial triage assessment does not replace that evaluation, and the importance of remaining in the ED until their evaluation is complete. ? ? ?  ?Tacy Learn, PA-C ?02/14/22 1515 ? ?

## 2022-02-14 NOTE — Assessment & Plan Note (Signed)
Continue Vanc PO  ?

## 2022-02-14 NOTE — ED Triage Notes (Signed)
Pt has hx of colon cancer. Pt's MD referred him to the ED d/t diarrhea which began several years ago. Pt reports that over the past few weeks his diarrhea has gotten worse. Pt reports pain through his back but none through his abdomen. Pt reports positive c dif test from MD today.  ?

## 2022-02-14 NOTE — ED Provider Notes (Signed)
?[facility] DEPT ?Provider Note ? ? ?CSN: [id number] ?Arrival date & time: 02/14/22  1334 ? ?  ? ?History ? ?Chief Complaint  ?Patient presents with  ? Diarrhea  ? ? ?Ryan Cowan is a 76 y.o. male hx of carcinoid tumor with mets, here presenting with diarrhea and dehydration.  Patient states that he is having watery diarrhea for the last several days.  He states that he has been going to the bathroom at least 7-8 times a day.  He tested positive for C. difficile and started on p.o. vancomycin 2 days ago.  He is still having persistent diarrhea.  Denies any vomiting but has poor appetite.  Oncology sent patient in for hydration and for goals of care discussion.  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 ? ?The history is provided by the patient.  ? ?  ? ?Home Medications ?Prior to Admission medications   ?Medication Sig Start Date End Date Taking? Authorizing Provider  ?calcium-vitamin D (OSCAL WITH D) 500-200 MG-UNIT tablet Take 2 tablets by mouth daily with breakfast.    [provider]  ?cyanocobalamin 2000 MCG tablet Take 1,000 mcg by mouth daily.    [provider]  ?diphenoxylate-atropine (LOMOTIL) 2.5-0.025 MG tablet Take 1 tablet by mouth 4 (four) times daily as needed for diarrhea or loose stools. 02/09/22   Heilingoetter, Cassandra L, PA-C  ?everolimus (AFINITOR) 10 MG tablet Take 1 tablet (10 mg total) by mouth daily. 12/02/21   [name], MD  ?furosemide (LASIX) 20 MG tablet Take 1 tablet (20 mg total) by mouth daily as needed. 12/20/21   [name], MD  ?levothyroxine (SYNTHROID) 100 MCG tablet Take 100 mcg by mouth daily. 07/30/20   [provider]  ?Multiple Vitamin (MULTIVITAMIN WITH MINERALS) TABS tablet Take 1 tablet by mouth daily. Centrum Silver 50 plus    [provider]  ?naproxen sodium (ANAPROX) 220 MG tablet Take 220 mg by mouth 2 (two) times daily as needed (pain).    [provider]  ?ondansetron  (ZOFRAN) 8 MG tablet Take 1 tablet (8 mg total) by mouth 2 (two) times daily as needed for nausea or vomiting. 01/06/22   Gus Height, MD  ?OVER THE COUNTER MEDICATION Place 1 application into both nostrils daily as needed (congestion). Vicks inhaler stick    [provider]  ?potassium chloride SA (KLOR-CON M) 20 MEQ tablet Take 1 tablet (20 mEq total) by mouth 2 (two) times daily. 12/20/21   [name], MD  ?sertraline (ZOLOFT) 100 MG tablet Take 100 mg by mouth daily.     [provider]  ?Telotristat Ethyl,as Etiprate, (XERMELO) 250 MG TABS Take 250 mg by mouth in the morning, at noon, and at bedtime. 09/29/21   [name], MD  ?vancomycin (VANCOCIN) 125 MG capsule Take 1 capsule (125 mg total) by mouth 4 (four) times daily. 02/09/22   Heilingoetter, Cassandra L, PA-C  ?   ? ?Allergies    ?Codeine and Everolimus   ? ?Review of Systems   ?Review of Systems  ?Gastrointestinal:  Positive for diarrhea.  ?All other systems reviewed and are negative. ? ?Physical Exam ?Updated Vital Signs ?BP (!) 129/54   Pulse 61   Temp 97.8 ?F (36.6 ?C) (Oral)   Resp 16   Ht '6\' 1"'$  (1.854 m)   Wt 69.4 kg   SpO2 100%   BMI 20.19 kg/m?  ?Physical Exam ?Vitals and nursing note reviewed.  ?Constitutional:   ?  Comments: Dehydrated, chronically ill  ?HENT:  ?   Head: Normocephalic.  ?   Nose: Nose normal.  ?   Mouth/Throat:  ?   Mouth: Mucous membranes are dry.  ?   Comments: Dehydrated ?Eyes:  ?   Extraocular Movements: Extraocular movements intact.  ?   Pupils: Pupils are equal, round, and reactive to light.  ?Cardiovascular:  ?   Rate and Rhythm: Normal rate and regular rhythm.  ?   Pulses: Normal pulses.  ?   Heart sounds: Normal heart sounds.  ?Pulmonary:  ?   Effort: Pulmonary effort is normal.  ?   Breath sounds: Normal breath sounds.  ?Abdominal:  ?   General: Abdomen is flat.  ?   Palpations: Abdomen is soft.  ?Musculoskeletal:     ?   General: Normal range of motion.  ?   Cervical back: Normal range of motion  and neck supple.  ?Skin: ?   General: Skin is warm.  ?   Capillary Refill: Capillary refill takes less than 2 seconds.  ?Neurological:  ?   General: No focal deficit present.  ?   Mental Status: He is alert and oriented to person, place, and time.  ?Psychiatric:     ?   Mood and Affect: Mood normal.     ?   Behavior: Behavior normal.  ? ? ?ED Results / Procedures / Treatments   ?Labs ?(all labs ordered are listed, but only abnormal results are displayed) ?Labs Reviewed  ?COMPREHENSIVE METABOLIC PANEL - Abnormal; Notable for the following components:  ?    Result Value  ? CO2 18 (*)   ? Glucose, Bld 109 (*)   ? BUN 27 (*)   ? Creatinine, Ser 1.48 (*)   ? Calcium 8.2 (*)   ? Total Protein 6.2 (*)   ? Albumin 2.6 (*)   ? GFR, Estimated 49 (*)   ? All other components within normal limits  ?CBC - Abnormal; Notable for the following components:  ? WBC 3.3 (*)   ? RBC 3.26 (*)   ? Hemoglobin 10.1 (*)   ? HCT 31.3 (*)   ? RDW 19.6 (*)   ? All other components within normal limits  ?LIPASE, BLOOD  ?URINALYSIS, ROUTINE W REFLEX MICROSCOPIC  ? ? ?EKG ?None ? ?Radiology ?No results found. ? ?Procedures ?Procedures  ? ? ?Medications Ordered in ED ?Medications  ?sodium chloride 0.9 % bolus 1,000 mL (has no administration in time range)  ? ? ?ED Course/ Medical Decision Making/ A&P ?  ?                        ?Medical Decision Making ?Ryan Cowan is a 76 y.o. male here presenting with diarrhea and dehydration.  Patient appears dehydrated from C. difficile colitis.  Plan to repeat labs and get CT abdomen pelvis and give IV fluids.  Patient will likely need admission and will need goals of care discussion regarding possible hospice ? ?6:34 PM ?I reviewed patient's labs and independently reviewed his CT scan.  Patient's labs showed bicarb of 18.  Patient also had CT that showed large ascites.  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  Patient has multiple paracentesis previously.  I have messaged Dr. Jule Ser  from infectious disease.  He recommend continue oral vancomycin.  I also messaged Dr. Burr Medico, his oncologist.  She will see him as consult tomorrow.  Hospitalist to admit ? ?Problems Addressed: ?  C. difficile colitis: acute illness or injury ?Malignant ascites: chronic illness or injury ?Metastatic carcinoid tumor [facility]): chronic illness or injury ? ?Amount and/or Complexity of Data Reviewed ?Labs: ordered. Decision-making details documented in ED Course. ?Radiology: ordered and independent interpretation performed. Decision-making details documented in ED Course. ? ?Risk ?Prescription drug management. ?Decision regarding hospitalization. ? ?Final Clinical Impression(s) / ED Diagnoses ?Final diagnoses:  ?None  ? ? ?Rx / DC Orders ?ED Discharge Orders   ? ? None  ? ?  ? ? ?  ?Drenda Freeze, MD ?02/14/22 Bosie Helper ? ?

## 2022-02-15 ENCOUNTER — Telehealth: Payer: Self-pay

## 2022-02-15 DIAGNOSIS — N179 Acute kidney failure, unspecified: Secondary | ICD-10-CM | POA: Diagnosis present

## 2022-02-15 DIAGNOSIS — E039 Hypothyroidism, unspecified: Secondary | ICD-10-CM | POA: Diagnosis present

## 2022-02-15 DIAGNOSIS — N183 Chronic kidney disease, stage 3 unspecified: Secondary | ICD-10-CM | POA: Diagnosis present

## 2022-02-15 DIAGNOSIS — A0472 Enterocolitis due to Clostridium difficile, not specified as recurrent: Secondary | ICD-10-CM | POA: Diagnosis present

## 2022-02-15 DIAGNOSIS — Z66 Do not resuscitate: Secondary | ICD-10-CM | POA: Diagnosis present

## 2022-02-15 DIAGNOSIS — Z6820 Body mass index (BMI) 20.0-20.9, adult: Secondary | ICD-10-CM | POA: Diagnosis not present

## 2022-02-15 DIAGNOSIS — E46 Unspecified protein-calorie malnutrition: Secondary | ICD-10-CM | POA: Diagnosis not present

## 2022-02-15 DIAGNOSIS — C7A029 Malignant carcinoid tumor of the large intestine, unspecified portion: Secondary | ICD-10-CM | POA: Diagnosis present

## 2022-02-15 DIAGNOSIS — C7B03 Secondary carcinoid tumors of bone: Secondary | ICD-10-CM | POA: Diagnosis present

## 2022-02-15 DIAGNOSIS — E878 Other disorders of electrolyte and fluid balance, not elsewhere classified: Secondary | ICD-10-CM | POA: Diagnosis present

## 2022-02-15 DIAGNOSIS — R180 Malignant ascites: Secondary | ICD-10-CM | POA: Diagnosis present

## 2022-02-15 DIAGNOSIS — E8809 Other disorders of plasma-protein metabolism, not elsewhere classified: Secondary | ICD-10-CM | POA: Diagnosis not present

## 2022-02-15 DIAGNOSIS — Z79890 Hormone replacement therapy (postmenopausal): Secondary | ICD-10-CM | POA: Diagnosis not present

## 2022-02-15 DIAGNOSIS — Z91148 Patient's other noncompliance with medication regimen for other reason: Secondary | ICD-10-CM | POA: Diagnosis not present

## 2022-02-15 DIAGNOSIS — C7B00 Secondary carcinoid tumors, unspecified site: Secondary | ICD-10-CM | POA: Diagnosis not present

## 2022-02-15 DIAGNOSIS — N1832 Chronic kidney disease, stage 3b: Secondary | ICD-10-CM

## 2022-02-15 DIAGNOSIS — R634 Abnormal weight loss: Secondary | ICD-10-CM | POA: Diagnosis not present

## 2022-02-15 DIAGNOSIS — Z79899 Other long term (current) drug therapy: Secondary | ICD-10-CM | POA: Diagnosis not present

## 2022-02-15 DIAGNOSIS — E8729 Other acidosis: Secondary | ICD-10-CM | POA: Diagnosis present

## 2022-02-15 DIAGNOSIS — E43 Unspecified severe protein-calorie malnutrition: Secondary | ICD-10-CM | POA: Diagnosis present

## 2022-02-15 DIAGNOSIS — D500 Iron deficiency anemia secondary to blood loss (chronic): Secondary | ICD-10-CM | POA: Diagnosis present

## 2022-02-15 DIAGNOSIS — Z85038 Personal history of other malignant neoplasm of large intestine: Secondary | ICD-10-CM | POA: Diagnosis not present

## 2022-02-15 DIAGNOSIS — C7A8 Other malignant neuroendocrine tumors: Secondary | ICD-10-CM | POA: Diagnosis present

## 2022-02-15 DIAGNOSIS — R627 Adult failure to thrive: Secondary | ICD-10-CM | POA: Diagnosis present

## 2022-02-15 DIAGNOSIS — R54 Age-related physical debility: Secondary | ICD-10-CM | POA: Diagnosis present

## 2022-02-15 DIAGNOSIS — E860 Dehydration: Secondary | ICD-10-CM | POA: Diagnosis present

## 2022-02-15 DIAGNOSIS — N1831 Chronic kidney disease, stage 3a: Secondary | ICD-10-CM | POA: Diagnosis present

## 2022-02-15 DIAGNOSIS — Z515 Encounter for palliative care: Secondary | ICD-10-CM

## 2022-02-15 DIAGNOSIS — C7B02 Secondary carcinoid tumors of liver: Secondary | ICD-10-CM | POA: Diagnosis present

## 2022-02-15 DIAGNOSIS — R64 Cachexia: Secondary | ICD-10-CM | POA: Diagnosis present

## 2022-02-15 LAB — MAGNESIUM: Magnesium: 1.9 mg/dL (ref 1.7–2.4)

## 2022-02-15 LAB — CBC
HCT: 29.4 % — ABNORMAL LOW (ref 39.0–52.0)
Hemoglobin: 9.5 g/dL — ABNORMAL LOW (ref 13.0–17.0)
MCH: 30.6 pg (ref 26.0–34.0)
MCHC: 32.3 g/dL (ref 30.0–36.0)
MCV: 94.8 fL (ref 80.0–100.0)
Platelets: 148 10*3/uL — ABNORMAL LOW (ref 150–400)
RBC: 3.1 MIL/uL — ABNORMAL LOW (ref 4.22–5.81)
RDW: 19.4 % — ABNORMAL HIGH (ref 11.5–15.5)
WBC: 3 10*3/uL — ABNORMAL LOW (ref 4.0–10.5)
nRBC: 0 % (ref 0.0–0.2)

## 2022-02-15 LAB — COMPREHENSIVE METABOLIC PANEL
ALT: 13 U/L (ref 0–44)
AST: 20 U/L (ref 15–41)
Albumin: 2.3 g/dL — ABNORMAL LOW (ref 3.5–5.0)
Alkaline Phosphatase: 64 U/L (ref 38–126)
Anion gap: 9 (ref 5–15)
BUN: 27 mg/dL — ABNORMAL HIGH (ref 8–23)
CO2: 17 mmol/L — ABNORMAL LOW (ref 22–32)
Calcium: 7.4 mg/dL — ABNORMAL LOW (ref 8.9–10.3)
Chloride: 111 mmol/L (ref 98–111)
Creatinine, Ser: 1.39 mg/dL — ABNORMAL HIGH (ref 0.61–1.24)
GFR, Estimated: 53 mL/min — ABNORMAL LOW (ref >60)
Glucose, Bld: 101 mg/dL — ABNORMAL HIGH (ref 70–99)
Potassium: 4.2 mmol/L (ref 3.5–5.1)
Sodium: 137 mmol/L (ref 135–145)
Total Bilirubin: 0.6 mg/dL (ref 0.3–1.2)
Total Protein: 5.4 g/dL — ABNORMAL LOW (ref 6.5–8.1)

## 2022-02-15 LAB — TSH: TSH: 28.733 u[IU]/mL — ABNORMAL HIGH (ref 0.350–4.500)

## 2022-02-15 LAB — PHOSPHORUS: Phosphorus: 2.4 mg/dL — ABNORMAL LOW (ref 2.5–4.6)

## 2022-02-15 LAB — PREALBUMIN: Prealbumin: 10.9 mg/dL — ABNORMAL LOW (ref 18–38)

## 2022-02-15 LAB — CK: Total CK: 75 U/L (ref 49–397)

## 2022-02-15 MED ORDER — LACTATED RINGERS IV SOLN: INTRAVENOUS | Status: DC

## 2022-02-15 MED ORDER — SODIUM CHLORIDE 0.9 % IV SOLN
75.0000 mL/h | INTRAVENOUS | Status: DC
  Administered 2022-02-15: 75 mL/h via INTRAVENOUS

## 2022-02-15 MED ORDER — LEVOTHYROXINE SODIUM 125 MCG PO TABS
125.0000 ug | ORAL_TABLET | Freq: Every day | ORAL | Status: DC
  Administered 2022-02-16 – 2022-02-20 (×5): 125 ug via ORAL
  Filled 2022-02-15 (×5): qty 1

## 2022-02-15 MED ORDER — K PHOS MONO-SOD PHOS DI & MONO 155-852-130 MG PO TABS
250.0000 mg | ORAL_TABLET | Freq: Two times a day (BID) | ORAL | Status: AC
  Administered 2022-02-15 – 2022-02-17 (×4): 250 mg via ORAL
  Filled 2022-02-15 (×4): qty 1

## 2022-02-15 NOTE — Progress Notes (Signed)
[facility]) Hospital Liaison Note ? ?Plan is to meet with patient's sister [name] at the bedside to evaluate/discuss hospice tomorrow 5.17 at 10:30am.  ? ?Please call with any questions or concerns. Thank you ? ?Roselee Nova, LCSW ?[place] [facility] Liaison ?[phone] ?

## 2022-02-15 NOTE — Assessment & Plan Note (Signed)
-   Check TSH continue home medications at current dose ° °

## 2022-02-15 NOTE — Consult Note (Signed)
? ?                                                                                ?Consultation Note ?Date: 02/15/2022  ? ?Patient Name: Ryan Cowan  ?DOB: 04/01/1946  MRN: [id number]  Age / Sex: 76 y.o., male  ?PCP: Leonard Downing, MD ?Referring Physician: Oswald Hillock, MD ? ?Reason for Consultation: Establishing goals of care ? ?HPI/Patient Profile: 76 y.o. male  with past medical history of hypothyroidism, CKD, former tobacco use, metastatic carcinoid tumor with ascites and C. difficile colitis admitted on 02/14/2022 with diarrhea and dehydration. Pt is receiving IV antibiotics and IVF.  ? ?Patient was schedule to have admission meeting at his home with [facility] yesterday - day of admission. PMT was consulted to discuss [place].  ? ?Clinical Assessment and Goals of Care: ?I have reviewed medical records including EPIC notes, labs and imaging, assessed the patient and then met with patient  to discuss diagnosis prognosis, GOC, EOL wishes, disposition and options. ? ?I introduced Palliative Medicine as specialized medical care for people living with serious illness. It focuses on providing relief from the symptoms and stress of a serious illness. The goal is to improve quality of life for both the patient and the family. ? ?We discussed a brief life review of the patient. Patient is divorced and has no children. He is close with his three sisters, Doloris Hall, and [place]. He says [name] is the most active person involved in his medical decision making and would like her input and presence on [place] discussions.  ? ?As far as functional and nutritional status pt endorses he is independent and lives home alone. He is able to cook/clean and perform ADLs without complication.   ? ?We discussed patient's current illness and what it means in the larger context of patient's on-going co-morbidities.  I attempted to elicit values and goals of care important to the patient. Patient shares he knows where he  is going and is not afraid to die. He is ready to focus on keping himself comfortable and living the best life that he can for however long he has left. He speaks at length of his faith and his church.  ? ?The difference between aggressive medical intervention and comfort care was considered in light of the patient's goals of care. Patient confirmed DNR/DNI and would not want escalation of care. He wants to focus on being home and as comfortable as possible. He is hesitant about saying he would like to enact his hospice benefits. He recalls family members who called hospice and died within a few days. Hospice philosphy and focus on quality of life discussed.  ? ?Pt would like to meet with hospice liaison to learn more about hospice and his option with his sister [name] present.  ? ?I spoke with [name] over the phone and she confirmed she will be at patient's bedside tomorrow at 1030am. ? ?Hospice Liaisons Shanita Julian Hy and Venia Carbon notified of patient's wishes to meet with them and his sister [name]. They plan on being at patient's room at 1030am tomorrow 5/17 to discuss patient's options with hospice.  ?  ?Discussed with patient/family  the importance of continued conversation with family and the medical providers regarding overall plan of care and treatment options, ensuring decisions are within the context of the patient?s values and GOCs.   ? ?Questions and concerns were addressed. The family was encouraged to call with questions or concerns.  ? ?Primary Decision Maker ?PATIENT ? ?Code Status/Advance Care Planning: ?DNR ? ?Prognosis:   ?< 6 months ? ?Discharge Planning: To Be Determined ? ?Primary Diagnoses: ?Present on Admission: ? Clostridioides difficile diarrhea ? Symptomatic anemia ? Weight loss, non-intentional ? Carcinoid tumor of colon, malignant ([place]) ? Hypoalbuminemia due to protein-calorie malnutrition ([place]) ? Iron deficiency anemia due to chronic blood loss ? Hypothyroidism ? CKD (chronic  kidney disease), stage III ([place]) ? ? ?Physical Exam ?Vitals reviewed.  ?Constitutional:   ?   Comments: Frail, temporal wasting  ?HENT:  ?   Mouth/Throat:  ?   Mouth: Mucous membranes are moist.  ?Eyes:  ?   Pupils: Pupils are equal, round, and reactive to light.  ?Cardiovascular:  ?   Rate and Rhythm: Normal rate.  ?   Pulses: Normal pulses.  ?Pulmonary:  ?   Effort: Pulmonary effort is normal.  ?Abdominal:  ?   Palpations: Abdomen is soft.  ?Musculoskeletal:     ?   General: Normal range of motion.  ?Skin: ?   General: Skin is warm and dry.  ?Neurological:  ?   Mental Status: He is alert and oriented to person, place, and time.  ?Psychiatric:     ?   Mood and Affect: Mood normal.     ?   Behavior: Behavior normal.     ?   Thought Content: Thought content normal.  ? ? ?Palliative Assessment/Data: 60% ? ? ? ? ?I discussed this patient's plan of care with patient, patient's sister [name], Hospice Liaisons Shanita Julian Hy and [place]. ? ?Thank you for this consult. Palliative medicine will continue to follow and assist holistically.  ? ?Time Total: 75 minutes ?Greater than 50%  of this time was spent counseling and coordinating care related to the above assessment and plan. ? ?Signed by: ?Jordan Hawks, DNP, FNP-BC ?Palliative Medicine ? ?  ?Please contact Palliative Medicine Team phone at [phone] for questions and concerns.  ?For individual provider: See Amion ? ? ? ? ? ? ? ? ? ? ? ? ?  ?

## 2022-02-15 NOTE — Evaluation (Signed)
Occupational Therapy Evaluation ?Patient Details ?Name: Ryan Cowan ?MRN: [id number] ?DOB: 26-Jul-1946 ?Today's Date: 02/15/2022 ? ? ?History of Present Illness Ryan Cowan is a 76 y.o. 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  ? ?Clinical Impression ?  ?Patient is currently requiring assistance with ADLs including Moderate to Total assist with Lower body ADLs, setup  assist with seated Upper body ADLs,  as well as Min guard assist with functional transfers to toilet.  Current level of function is below patient's typical baseline.  During this evaluation, patient was limited by generalized weakness, impaired activity tolerance, and pain, as well as decreased attention to tasks, all of which has the potential to impact patient's safety and independence during functional mobility, as well as performance for ADLs.  Patient lives alone and initially denied any support system, but with further questioning pt revealed that he has sisters who "may be able to help. I don't know."  Patient demonstrates good rehab potential, and should benefit from continued skilled occupational therapy services while in acute care to maximize safety, independence and quality of life at home.  Continued occupational therapy services in the home is recommended.  Also recommend that pt does have some outside support from family if he does go home. ? ?? ?   ? ?Recommendations for follow up therapy are one component of a multi-disciplinary discharge planning process, led by the attending physician.  Recommendations may be updated based on patient status, additional functional criteria and insurance authorization.  ? ?Follow Up Recommendations ? Home health OT  ?  ?Assistance Recommended at Discharge Intermittent Supervision/Assistance  ?Patient can return home with the  following A little help with bathing/dressing/bathroom;Assistance with cooking/housework ? ?  ?Functional Status Assessment ? Patient has had a recent decline in their functional status and demonstrates the ability to make significant improvements in function in a reasonable and predictable amount of time.  ?Equipment Recommendations ? BSC/3in1  ?  ?Recommendations for Other Services   ? ? ?  ?Precautions / Restrictions Precautions ?Precautions: Fall ?Precaution Comments: C DIFF, wearing pullups ?Restrictions ?Weight Bearing Restrictions: No  ? ?  ? ?Mobility Bed Mobility ?  ?  ?  ?  ?  ?  ?  ?General bed mobility comments: In recliner ?  ? ?Transfers ?Overall transfer level: Needs assistance ?Equipment used: None ?Transfers: Sit to/from Stand ?Sit to Stand: Min guard ?  ?  ?  ?  ?  ?  ?  ? ?  ?Balance Overall balance assessment: Mild deficits observed, not formally tested ?  ?  ?  ?  ?  ?  ?  ?  ?  ?  ?  ?  ?  ?  ?  ?  ?  ?  ?   ? ?ADL either performed or assessed with clinical judgement  ? ?ADL Overall ADL's : Needs assistance/impaired ?Eating/Feeding: Modified independent ?Eating/Feeding Details (indicate cue type and reason): Pt able to reach for cup of water and drink. ?Grooming: Wash/dry hands;Standing;Supervision/safety ?  ?Upper Body Bathing: Set up;Sitting ?  ?Lower Body Bathing: Moderate assistance;Sitting/lateral leans;Sit to/from stand ?  ?Upper Body Dressing : Set up;Sitting ?  ?Lower Body Dressing: Sitting/lateral leans;Min guard;Sit to/from stand;Total assistance;Cueing for sequencing ?Lower Body Dressing Details (indicate cue type and reason): Pt unable to complete Figure 4 with edematous BLEs RT>LT. Pt required Total Assist to don  slippers. Pt able to stand and negotiate his diaper brief over hips to doff prior to toilet tranfer with cues to remember brief and Min guard for balance safety. ?Toilet Transfer: Cueing for safety;Min guard;Regular Toilet;Grab bars ?Toilet Transfer Details (indicate cue  type and reason): Min Guard to control descent to toilet with pt holding grab bar on RT. ?Toileting- Water quality scientist and Hygiene: Min guard ?Toileting - Clothing Manipulation Details (indicate cue type and reason): Unobserved. Pt agreed to be left on toilet with pull cord (Nursing notified that pt on toilet). Would anticipate Min guard. ?  ?  ?Functional mobility during ADLs: Supervision/safety;Min guard;Cueing for safety ?   ? ? ? ?Vision Baseline Vision/History: 1 Wears glasses ?Patient Visual Report: No change from baseline ?Vision Assessment?: No apparent visual deficits  ?   ?Perception   ?  ?Praxis   ?  ? ?Pertinent Vitals/Pain Pain Assessment ?Pain Assessment: Faces ?Faces Pain Scale: Hurts a little bit ?Pain Location: Stomach while leaning anteriorly in recliner. ?Pain Descriptors / Indicators: Grimacing ?Pain Intervention(s): Limited activity within patient's tolerance, Monitored during session  ? ? ? ?Hand Dominance Right ?  ?Extremity/Trunk Assessment Upper Extremity Assessment ?Upper Extremity Assessment: Generalized weakness ?  ?Lower Extremity Assessment ?Lower Extremity Assessment: Defer to PT evaluation ?RLE Deficits / Details: knee extension 4/5, edema  noted lower leg , redness lower leg ?LLE Deficits / Details: edema and rednesss noted ?LLE Sensation: WNL ?  ?Cervical / Trunk Assessment ?Cervical / Trunk Assessment: Normal ?  ?Communication Communication ?Communication: No difficulties ?  ?Cognition Arousal/Alertness: Awake/alert ?Behavior During Therapy: [facility] for tasks assessed/performed ?  ?  ?  ?  ?  ?  ?  ?  ?  ?  ?  ?  ?  ?  ?  ?  ?  ?General Comments: Loquatious with tangential converstion, ?  ?  ?General Comments  Pt is easily distracted and talks without stopping which does decrease pt's safety awareness. ? ?  ?Exercises   ?  ?Shoulder Instructions    ? ? ?Home Living Family/patient expects to be discharged to:: Private residence ?Living Arrangements: Alone ?Available Help at  Discharge: Other (Comment) (Pt denies any assistance available. "Just me") ?Type of Home: House ?Home Access: Stairs to enter ?Entrance Stairs-Number of Steps: 2 ?Entrance Stairs-Rails: Left ?Home Layout: One level ?  ?  ?Bathroom Shower/Tub: Walk-in shower ?  ?Bathroom Toilet: Standard ?  ?  ?Home Equipment: None ?  ?Additional Comments: father's cane ?  ? ?  ?Prior Functioning/Environment Prior Level of Function : Independent/Modified Independent ?  ?  ?  ?  ?  ?  ?Mobility Comments: was still driving until up to admission ?ADLs Comments: IADLs, stated that he has taken a sponge bath recently ?  ? ?  ?  ?OT Problem List: Pain;Decreased strength;Other (comment);Impaired balance (sitting and/or standing);Decreased knowledge of use of DME or AE;Decreased activity tolerance (No support sysytem except sisters who may not be available.) ?  ?   ?OT Treatment/Interventions: Self-care/ADL training;Therapeutic exercise;Therapeutic activities;Cognitive remediation/compensation;Energy conservation;DME and/or AE instruction;Patient/family education;Balance training  ?  ?OT Goals(Current goals can be found in the care plan section) Acute Rehab OT Goals ?Patient Stated Goal: To go home. ?OT Goal Formulation: With patient ?Time For Goal Achievement: 03/01/22 ?Potential to Achieve Goals: Good ?ADL Goals ?Pt Will Perform Lower Body Bathing: with supervision;with adaptive equipment ?Pt Will Perform Lower Body Dressing: with modified independence;with adaptive equipment;sitting/lateral leans;sit to/from stand ?Pt Will Transfer to Toilet: with modified independence;ambulating ?Pt  Will Perform Toileting - Clothing Manipulation and hygiene: with modified independence ?Pt Will Perform Tub/Shower Transfer: with supervision ?Pt/caregiver will Perform Home Exercise Program: Both right and left upper extremity;Increased strength;With written HEP provided ?Additional ADL Goal #1: Pt will engage in 10 min standing functional activities without  loss of balance, in order to demonstrate improved activity tolerance and balance needed to perform ADLs safely at home.  ?OT Frequency: Min 2X/week ?  ? ?Co-evaluation   ?  ?  ?  ?  ? ?  ?AM-PAC OT "6 Clicks

## 2022-02-15 NOTE — Progress Notes (Addendum)
[facility] ? ?PROGRESS NOTE ? ?Ryan Cowan UMP:[id number] DOB: 1945-11-14 DOA: 02/14/2022 ?PCP: Leonard Downing, MD ? ? ?Brief HPI:   ?76 year old male with medical history of metastatic carcinoid tumor with ascites, hypothyroidism, CKD was recently started on vancomycin for C. difficile colitis.  Patient was continued to have diarrhea despite being on p.o. vancomycin and was sent by oncology to ER for further evaluation and management.  Patient was diagnosed with C. difficile colitis on 10th May and was started on p.o. Vanc on 02/11/2022. ? ? ? ? ?Subjective  ? ?Patient continues to have loose stools.  Patient states that he had 3 loose stool this morning. ? ? Assessment/Plan:  ? ? ?C. difficile colitis ?-Continues to have loose stool ?-Continue p.o. vancomycin ?-We will change IV fluids to LR at 75 mill per hour due to hyperchloremic metabolic acidosis ? ?Hyperchloremic metabolic acidosis ?-Change IV fluids to LR at 75 mL/h ? ?Hypothyroidism ?-TSH elevated at 28.73 ?-We will increase the dose of Synthroid to 125 mcg daily ?-Check TSH in 6 to 8 weeks ? ?Acute kidney injury ?-Patient baseline creatinine 1.13 as of 01/06/2022 ?-Today improved to 1.39 with IV fluids ?-Follow BMP in am ? ?Iron-deficiency anemia due to chronic blood loss ?-Hemoglobin has been stable ? ?Metastatic neuroendocrine tumor ?-Oncology following ?-Oncology considering hospice care at discharge ? ?Protein calorie malnutrition ?-We will consult dietitian ? ?Hypophosphatemia ?-Replace and check phosphorus level in a.m. ? ? ?Medications ? ?  ? levothyroxine  100 mcg Oral Q0600  ? potassium chloride SA  20 mEq Oral BID  ? sertraline  100 mg Oral Daily  ? Telotristat Ethyl(as Etiprate)  250 mg Oral TID  ? vancomycin  125 mg Oral QID  ? ? ? Data Reviewed:  ? ?CBG: ? ?No results for input(s): GLUCAP in the last 168 hours. ? ?SpO2: 99 %  ? ? ?Vitals:  ? 02/14/22 1930 02/14/22 2128 02/15/22 0225 02/15/22 0429  ?BP: 138/64 130/72 110/69  105/60  ?Pulse: 67 75 70 77  ?Resp: '16 20 20 16  '$ ?Temp:  (!) 97.4 ?F (36.3 ?C) 97.6 ?F (36.4 ?C) 97.7 ?F (36.5 ?C)  ?TempSrc:    Oral  ?SpO2: 96% 93% 100% 99%  ?Weight:      ?Height:      ? ? ? ? ?Data Reviewed: ? ?Basic Metabolic Panel: ?Recent Labs  ?Lab 02/09/22 ?1425 02/11/22 ?1336 02/14/22 ?1458 02/15/22 ?0216  ?NA 139 138 138 137  ?K 3.9 4.2 4.5 4.2  ?CL 110 108 110 111  ?CO2 20* 21* 18* 17*  ?GLUCOSE 114* 116* 109* 101*  ?BUN 38* 32* 27* 27*  ?CREATININE 1.72* 1.61* 1.48* 1.39*  ?CALCIUM 8.1* 7.9* 8.2* 7.4*  ?MG  --   --   --  1.9  ?PHOS  --   --   --  2.4*  ? ? ?CBC: ?Recent Labs  ?Lab 02/09/22 ?1425 02/11/22 ?1336 02/14/22 ?1458 02/15/22 ?0216  ?WBC 3.6* 3.8* 3.3* 3.0*  ?NEUTROABS 3.0 3.2  --   --   ?HGB 10.0* 10.3* 10.1* 9.5*  ?HCT 31.2* 32.0* 31.3* 29.4*  ?MCV 92.3 93.0 96.0 94.8  ?PLT 149* 148* 163 148*  ? ? ?LFT ?Recent Labs  ?Lab 02/09/22 ?1425 02/11/22 ?1336 02/14/22 ?1458 02/15/22 ?0216  ?AST '25 21 22 20  '$ ?ALT '15 13 16 13  '$ ?ALKPHOS 77 76 73 64  ?BILITOT 0.6 0.3 0.7 0.6  ?PROT 6.5 6.4* 6.2* 5.4*  ?ALBUMIN 2.6* 3.1* 2.6* 2.3*  ? ?  ?  Antibiotics: ?Anti-infectives (From admission, onward)  ? ? Start     Dose/Rate Route Frequency Ordered Stop  ? 02/14/22 2300  vancomycin (VANCOCIN) capsule 125 mg  Status:  Discontinued       ? 125 mg Oral 4 times daily 02/14/22 2212 02/14/22 2216  ? 02/14/22 2300  vancomycin (VANCOCIN) capsule 125 mg  Status:  Discontinued       ? 125 mg Oral 4 times daily 02/14/22 2212 02/14/22 2216  ? 02/14/22 1800  vancomycin (VANCOCIN) capsule 125 mg       ? 125 mg Oral 4 times daily 02/14/22 1641 02/24/22 1759  ? ?  ? ? ? ?DVT prophylaxis: SCDs ? ?Code Status: DNR ? ?Family Communication: No family at bedside ? ? ?CONSULTS  ? ? ?Objective  ? ? ?Physical Examination: ? ? ?General-appears in no acute distress ?Heart-S1-S2, regular, no murmur auscultated ?Lungs-clear to auscultation bilaterally, no wheezing or crackles auscultated ?Abdomen-soft, nontender, no  organomegaly ?Extremities-no edema in the lower extremities ?Neuro-alert, oriented x3, no focal deficit noted ? ? ?Status is: Inpatient: C. difficile diarrhea ? ? ? ?  ? ? ? ? ? ?Ryan Cowan ?  ?[facility] ?If 7PM-7AM, please contact night-coverage at [email or web address], ?Office  [phone] ? ? ?02/15/2022, 3:32 PM  LOS: 0 days  ? ? ? ? ? ? ? ? ? ? ?  ?

## 2022-02-15 NOTE — Assessment & Plan Note (Signed)
Chronic currently stable ?

## 2022-02-15 NOTE — Assessment & Plan Note (Signed)
Will order nutritional consult ?

## 2022-02-15 NOTE — Progress Notes (Signed)
Ryan Cowan   DOB:1945-10-17   [id number]   ASN#:[id number] ? ?Medical oncology follow-up ? ?Subjective: Patient is well-known to me, under my care for his metastatic neuroendocrine tumor.  He was admitted yesterday from ED for severe diarrhea, C. difficile colitis, and failure to thrive at home.  He is overall feeling better this morning, had 3 bowel movements last night, his sister is at bedside. ? ? ?Objective:  ?Vitals:  ? 02/15/22 0225 02/15/22 0429  ?BP: 110/69 105/60  ?Pulse: 70 77  ?Resp: 20 16  ?Temp: 97.6 ?F (36.4 ?C) 97.7 ?F (36.5 ?C)  ?SpO2: 100% 99%  ?  Body mass index is 20.19 kg/m?. ? ?Intake/Output Summary (Last 24 hours) at 02/15/2022 1035 ?Last data filed at 02/15/2022 0600 ?Gross per 24 hour  ?Intake 323.72 ml  ?Output --  ?Net 323.72 ml  ? ? ? Sclerae unicteric, cachectic appearing ? Oropharynx clear ? No peripheral adenopathy ? Lungs clear -- no rales or rhonchi ? Heart regular rate and rhythm ? Abdomen soft, mild diffuse tenderness. ? MSK no focal spinal tenderness, moderate pitting edema on bilateral lower extremity below knee ? Neuro nonfocal ?  ? ?CBG (last 3)  ?No results for input(s): GLUCAP in the last 72 hours. ? ? ?Labs:  ?Urine Studies ?No results for input(s): UHGB, CRYS in the last 72 hours. ? ?Invalid input(s): UACOL, UAPR, USPG, UPH, UTP, UGL, UKET, UBIL, UNIT, UROB, ULEU, UEPI, UWBC, URBC, UBAC, CAST, UCOM, BILUA ? ?Basic Metabolic Panel: ?Recent Labs  ?Lab 02/09/22 ?1425 02/11/22 ?1336 02/14/22 ?1458 02/15/22 ?0216  ?NA 139 138 138 137  ?K 3.9 4.2 4.5 4.2  ?CL 110 108 110 111  ?CO2 20* 21* 18* 17*  ?GLUCOSE 114* 116* 109* 101*  ?BUN 38* 32* 27* 27*  ?CREATININE 1.72* 1.61* 1.48* 1.39*  ?CALCIUM 8.1* 7.9* 8.2* 7.4*  ?MG  --   --   --  1.9  ?PHOS  --   --   --  2.4*  ? ?GFR ?Estimated Creatinine Clearance: 44.4 mL/min (A) (by C-G formula based on SCr of 1.39 mg/dL (H)). ?Liver Function Tests: ?Recent Labs  ?Lab 02/09/22 ?1425 02/11/22 ?1336 02/14/22 ?1458 02/15/22 ?0216  ?AST '25  21 22 20  '$ ?ALT '15 13 16 13  '$ ?ALKPHOS 77 76 73 64  ?BILITOT 0.6 0.3 0.7 0.6  ?PROT 6.5 6.4* 6.2* 5.4*  ?ALBUMIN 2.6* 3.1* 2.6* 2.3*  ? ?Recent Labs  ?Lab 02/14/22 ?1458  ?LIPASE 40  ? ?No results for input(s): AMMONIA in the last 168 hours. ?Coagulation profile ?No results for input(s): INR, PROTIME in the last 168 hours. ? ?CBC: ?Recent Labs  ?Lab 02/09/22 ?1425 02/11/22 ?1336 02/14/22 ?1458 02/15/22 ?0216  ?WBC 3.6* 3.8* 3.3* 3.0*  ?NEUTROABS 3.0 3.2  --   --   ?HGB 10.0* 10.3* 10.1* 9.5*  ?HCT 31.2* 32.0* 31.3* 29.4*  ?MCV 92.3 93.0 96.0 94.8  ?PLT 149* 148* 163 148*  ? ?Cardiac Enzymes: ?Recent Labs  ?Lab 02/15/22 ?0216  ?CKTOTAL 75  ? ?BNP: ?Invalid input(s): POCBNP ?CBG: ?No results for input(s): GLUCAP in the last 168 hours. ?D-Dimer ?No results for input(s): DDIMER in the last 72 hours. ?Hgb A1c ?No results for input(s): HGBA1C in the last 72 hours. ?Lipid Profile ?No results for input(s): CHOL, HDL, LDLCALC, TRIG, CHOLHDL, LDLDIRECT in the last 72 hours. ?Thyroid function studies ?Recent Labs  ?  02/15/22 ?0216  ?TSH 28.733*  ? ?Anemia work up ?No results for input(s): VITAMINB12, FOLATE, FERRITIN, TIBC, IRON, RETICCTPCT  in the last 72 hours. ?Microbiology ?Recent Results (from the past 240 hour(s))  ?Gastrointestinal Panel by PCR , Stool     Status: None  ? Collection Time: 02/09/22  4:26 PM  ? Specimen: Stool  ?Result Value Ref Range Status  ? Campylobacter species NOT DETECTED NOT DETECTED Final  ? Plesimonas shigelloides NOT DETECTED NOT DETECTED Final  ? Salmonella species NOT DETECTED NOT DETECTED Final  ? Yersinia enterocolitica NOT DETECTED NOT DETECTED Final  ? Vibrio species NOT DETECTED NOT DETECTED Final  ? Vibrio cholerae NOT DETECTED NOT DETECTED Final  ? Enteroaggregative E coli (EAEC) NOT DETECTED NOT DETECTED Final  ? Enteropathogenic E coli (EPEC) NOT DETECTED NOT DETECTED Final  ? Enterotoxigenic E coli (ETEC) NOT DETECTED NOT DETECTED Final  ? Shiga like toxin producing E coli (STEC) NOT  DETECTED NOT DETECTED Final  ? Shigella/Enteroinvasive E coli (EIEC) NOT DETECTED NOT DETECTED Final  ? Cryptosporidium NOT DETECTED NOT DETECTED Final  ? Cyclospora cayetanensis NOT DETECTED NOT DETECTED Final  ? Entamoeba histolytica NOT DETECTED NOT DETECTED Final  ? Giardia lamblia NOT DETECTED NOT DETECTED Final  ? Adenovirus F40/41 NOT DETECTED NOT DETECTED Final  ? Astrovirus NOT DETECTED NOT DETECTED Final  ? Norovirus GI/GII NOT DETECTED NOT DETECTED Final  ? Rotavirus A NOT DETECTED NOT DETECTED Final  ? Sapovirus (I, II, IV, and V) NOT DETECTED NOT DETECTED Final  ?  Comment: Performed at [facility], [address] [place], [place] [address]  ?C difficile quick screen w PCR reflex     Status: Abnormal  ? Collection Time: 02/09/22  4:31 PM  ? Specimen: STOOL  ?Result Value Ref Range Status  ? C Diff antigen POSITIVE (A) NEGATIVE Final  ? C Diff toxin POSITIVE (A) NEGATIVE Final  ? C Diff interpretation Toxin producing C. difficile detected.  Final  ?  Comment: CRITICAL RESULT CALLED TO, READ BACK BY AND VERIFIED WITH: ?NORMAN,A. RN '@[id number]'$  ON 02/09/2022 BY COHEN,K ?Performed at [facility], [place] [address], [place], [place] [address] ?  ? ? ? ? ?Studies:  ?CT ABDOMEN PELVIS W CONTRAST ? ?Result Date: 02/14/2022 ?CLINICAL DATA:  Diarrhea, nausea, vomiting, history of colon carcinoma EXAM: CT ABDOMEN AND PELVIS WITH CONTRAST TECHNIQUE: Multidetector CT imaging of the abdomen and pelvis was performed using the standard protocol following bolus administration of intravenous contrast. RADIATION DOSE REDUCTION: This exam was performed according to the departmental dose-optimization program which includes automated exposure control, adjustment of the mA and/or kV according to patient size and/or use of iterative reconstruction technique. CONTRAST:  59m OMNIPAQUE IOHEXOL 300 MG/ML  SOLN COMPARISON:  06/08/2021 FINDINGS: Lower chest: No significant abnormality is seen in the visualized  lower lung fields. Hepatobiliary: There are multiple hypervascular lesions in the liver largest measuring 2.3 cm in size. There is interval appearance of new enhancing lesion with central low attenuation in the inferior aspect of right lobe of liver. There is 12 mm low-density lesion in the left lobe, possibly a cyst or hemangioma. There is mild nodularity in the liver surface. There is no dilation of bile ducts. Gallbladder is unremarkable. Pancreas: No focal abnormality is seen. Spleen: Spleen is smaller than usual in size. Adrenals/Urinary Tract: Adrenals are unremarkable. There is no hydronephrosis. Is 3.5 cm smooth marginated fluid density lesion in the lower pole of left kidney. There are no renal or ureteral stones. Urinary bladder is unremarkable. Stomach/Bowel: Small hiatal hernia is seen. Stomach is not distended. There is mild diffuse  wall thickening in the distal small bowel loops. This may be related to large ascites. Appendix is not distinctly seen. There is no significant wall thickening in the colon. Vascular/Lymphatic: Scattered atherosclerotic plaques and calcifications are seen in the aorta and its major branches. There is 4.8 x 3.2 cm soft tissue mass in the mesentery in the right lower quadrant of abdomen which has not changed significantly. There is stranding in the mesentery, possibly due to prominent vasa recta. Reproductive: There is inhomogeneous attenuation in the prostate. There is ectasia of vessels in the left spermatic cord, possibly suggesting varicocele. Other: There is marked interval increase in ascites. Musculoskeletal: Degenerative changes are noted in the lumbar spine. IMPRESSION: There is massive ascites which may suggest peritoneal metastatic disease or cirrhosis. There is increase in number of enhancing lesions in the liver suggesting progression of hepatic metastatic disease. There is 4.8 x 3.2 cm soft tissue mass in the mesentery in the right lower abdomen suggesting  possible metastatic disease with no significant interval change. There is no evidence of intestinal obstruction or pneumoperitoneum. There is no hydronephrosis. Possible varicocele on the left side. Left

## 2022-02-15 NOTE — Plan of Care (Signed)

## 2022-02-15 NOTE — Assessment & Plan Note (Signed)
-  chronic avoid nephrotoxic medications such as NSAIDs, Vanco Zosyn combo,  avoid hypotension, continue to follow renal function  

## 2022-02-15 NOTE — Telephone Encounter (Signed)
I had a very extensive conversation with the pt regarding whether is does or does not want to move forward with his referral to hospice. I have clarified with the pt that he definitely wants hospice and will contact his sister to ensure she is on the same page but has several concerns regarding his c.diff. Pt is unsure when he started the oral Vancomycin. I called his pharmacy and was advised he picked up the rx 02/11/22. Pt advised he might have started taking it on Saturday but really isn't sure. I have spoken with Dr. Burr Medico regarding this and she advised she will contact the pt directly. ?

## 2022-02-15 NOTE — Evaluation (Signed)
Physical Therapy Evaluation ?Patient Details ?Name: Ryan Cowan ?MRN: [id number] ?DOB: 01-03-1946 ?Today's Date: 02/15/2022 ? ?History of Present Illness ? Ryan Cowan is a 76 y.o. 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  ?Clinical Impression ? The patient ambulated with min guard and use of Rw. Patient did demonstrate  steady  balance and at times did not support with RW. Patient lives aline, was Independent and driving. Unsure if family available, patient's  sister in room with patient.  Pt admitted with above diagnosis.  Pt currently with functional limitations due to the deficits listed below (see PT Problem List). Pt will benefit from skilled PT to increase their independence and safety with mobility to allow discharge to the venue listed below.   ? ?   ? ?Recommendations for follow up therapy are one component of a multi-disciplinary discharge planning process, led by the attending physician.  Recommendations may be updated based on patient status, additional functional criteria and insurance authorization. ? ?Follow Up Recommendations Home health PT ? ?  ?Assistance Recommended at Discharge Intermittent Supervision/Assistance  ?Patient can return home with the following ? Assist for transportation;Assistance with cooking/housework;Help with stairs or ramp for entrance ? ?  ?Equipment Recommendations Rolling walker (2 wheels)  ?Recommendations for Other Services ?    ?  ?Functional Status Assessment Patient has had a recent decline in their functional status and demonstrates the ability to make significant improvements in function in a reasonable and predictable amount of time.  ? ?  ?Precautions / Restrictions Precautions ?Precautions: Fall ?Precaution Comments: C DIFF, wearing pullups  ? ?  ? ?Mobility ? Bed Mobility ?Overal bed mobility: Modified Independent ?  ?  ?  ?   ?  ?  ?General bed mobility comments: HOB raised. used  hands to slide the left leg to bed edge ?  ? ?Transfers ?Overall transfer level: Needs assistance ?Equipment used: Rolling walker (2 wheels) ?Transfers: Sit to/from Stand ?Sit to Stand: Min guard, From elevated surface ?  ?  ?  ?  ?  ?General transfer comment: patient stood with no support. ?  ? ?Ambulation/Gait ?Ambulation/Gait assistance: Min guard ?Gait Distance (Feet): 50 Feet ?Assistive device: Rolling walker (2 wheels) ?Gait Pattern/deviations: Step-through pattern, Shuffle ?Gait velocity: decr ?  ?  ?General Gait Details: shuffles, does not pick up feet. Patient not relying on the RW . At times picks up the RW. ? ?Stairs ?  ?  ?  ?  ?  ? ?Wheelchair Mobility ?  ? ?Modified Rankin (Stroke Patients Only) ?  ? ?  ? ?Balance Overall balance assessment: Mild deficits observed, not formally tested ?  ?  ?  ?  ?  ?  ?  ?  ?  ?  ?  ?  ?  ?  ?  ?  ?  ?  ?   ? ? ? ?Pertinent Vitals/Pain Pain Assessment ?Pain Assessment: Faces ?Faces Pain Scale: Hurts a little bit ?Pain Location: upper back with twisting ?Pain Descriptors / Indicators: Discomfort ?Pain Intervention(s): Monitored during session  ? ? ?Home Living Family/patient expects to be discharged to:: (P) Private residence ?  ?Available Help at Discharge: Available PRN/intermittently;Family ?Type of Home: House ?Home Access: Stairs to enter ?Entrance Stairs-Rails: Left ?Entrance Stairs-Number of Steps: 2 ?  ?Home Layout: One level ?Home Equipment: None ?Additional Comments: father's cane  ?  ?Prior Function  Prior Level of Function : Independent/Modified Independent ?  ?  ?  ?  ?  ?  ?Mobility Comments: was still driving until up to admission ?ADLs Comments: IADLs, stated that he has taken a sponge bath recently ?  ? ? ?Hand Dominance  ? Dominant Hand: Right ? ?  ?Extremity/Trunk Assessment  ? Upper Extremity Assessment ?Upper Extremity Assessment: Overall WFL for tasks assessed ?  ? ?Lower Extremity  Assessment ?Lower Extremity Assessment: RLE deficits/detail;LLE deficits/detail ?RLE Deficits / Details: knee extension 4/5, edema  noted lower leg , redness lower leg ?LLE Deficits / Details: edema and rednesss noted ?LLE Sensation: WNL ?  ? ?Cervical / Trunk Assessment ?Cervical / Trunk Assessment: Normal  ?Communication  ? Communication: No difficulties  ?Cognition Arousal/Alertness: Awake/alert ?Behavior During Therapy: [facility] for tasks assessed/performed ?Overall Cognitive Status: Within Functional Limits for tasks assessed ?  ?  ?  ?  ?  ?  ?  ?  ?  ?  ?  ?  ?  ?  ?  ?  ?General Comments: tangential converstion, ?  ?  ? ?  ?General Comments   ? ?  ?Exercises    ? ?Assessment/Plan  ?  ?PT Assessment Patient needs continued PT services  ?PT Problem List Decreased strength;Decreased activity tolerance;Decreased knowledge of use of DME;Decreased safety awareness ? ?   ?  ?PT Treatment Interventions DME instruction;Therapeutic activities;Gait training;Therapeutic exercise;Patient/family education;Functional mobility training   ? ?PT Goals (Current goals can be found in the Care Plan section)  ?Acute Rehab PT Goals ?Patient Stated Goal: wants to go home ?PT Goal Formulation: With patient/family ?Time For Goal Achievement: 03/01/22 ?Potential to Achieve Goals: Good ? ?  ?Frequency Min 3X/week ?  ? ? ?Co-evaluation   ?  ?  ?  ?  ? ? ?  ?AM-PAC PT "6 Clicks" Mobility  ?Outcome Measure Help needed turning from your back to your side while in a flat bed without using bedrails?: None ?Help needed moving from lying on your back to sitting on the side of a flat bed without using bedrails?: None ?Help needed moving to and from a bed to a chair (including a wheelchair)?: A Little ?Help needed standing up from a chair using your arms (e.g., wheelchair or bedside chair)?: A Little ?Help needed to walk in hospital room?: A Little ?Help needed climbing 3-5 steps with a railing? : A Lot ?6 Click Score: 19 ? ?  ?End of Session  Equipment Utilized During Treatment: Gait belt ?Activity Tolerance: Patient tolerated treatment well ?Patient left: in chair;with call bell/phone within reach;with chair alarm set ?Nurse Communication: Mobility status ?PT Visit Diagnosis: Unsteadiness on feet (R26.81);Difficulty in walking, not elsewhere classified (R26.2) ?  ? ?Time: 8828-0034 ?PT Time Calculation (min) (ACUTE ONLY): 47 min ? ? ?Charges:   PT Evaluation ?$PT Eval Low Complexity: 1 Low ?PT Treatments ?$Gait Training: 8-22 mins ?$Self Care/Home Management: 8-22 ?  ?   ? ? ?Tresa Endo PT ?Acute Rehabilitation Services ?Pager [phone] ?Office [phone] ? ? ?Dalayla Aldredge, Shella Maxim ?02/15/2022, 9:58 AM ? ?

## 2022-02-16 ENCOUNTER — Other Ambulatory Visit (HOSPITAL_COMMUNITY): Payer: Self-pay

## 2022-02-16 ENCOUNTER — Encounter: Payer: Medicare Other | Admitting: Physician Assistant

## 2022-02-16 ENCOUNTER — Ambulatory Visit: Payer: Medicare Other

## 2022-02-16 DIAGNOSIS — C7A029 Malignant carcinoid tumor of the large intestine, unspecified portion: Secondary | ICD-10-CM | POA: Diagnosis not present

## 2022-02-16 DIAGNOSIS — R634 Abnormal weight loss: Secondary | ICD-10-CM

## 2022-02-16 DIAGNOSIS — Z66 Do not resuscitate: Secondary | ICD-10-CM

## 2022-02-16 DIAGNOSIS — E8809 Other disorders of plasma-protein metabolism, not elsewhere classified: Secondary | ICD-10-CM

## 2022-02-16 DIAGNOSIS — Z515 Encounter for palliative care: Secondary | ICD-10-CM

## 2022-02-16 DIAGNOSIS — A0472 Enterocolitis due to Clostridium difficile, not specified as recurrent: Secondary | ICD-10-CM | POA: Diagnosis not present

## 2022-02-16 DIAGNOSIS — D649 Anemia, unspecified: Secondary | ICD-10-CM

## 2022-02-16 DIAGNOSIS — E46 Unspecified protein-calorie malnutrition: Secondary | ICD-10-CM

## 2022-02-16 DIAGNOSIS — N1832 Chronic kidney disease, stage 3b: Secondary | ICD-10-CM | POA: Diagnosis not present

## 2022-02-16 DIAGNOSIS — R180 Malignant ascites: Secondary | ICD-10-CM | POA: Diagnosis not present

## 2022-02-16 DIAGNOSIS — D500 Iron deficiency anemia secondary to blood loss (chronic): Secondary | ICD-10-CM

## 2022-02-16 LAB — COMPREHENSIVE METABOLIC PANEL
ALT: 13 U/L (ref 0–44)
AST: 19 U/L (ref 15–41)
Albumin: 2.2 g/dL — ABNORMAL LOW (ref 3.5–5.0)
Alkaline Phosphatase: 62 U/L (ref 38–126)
Anion gap: 9 (ref 5–15)
BUN: 23 mg/dL (ref 8–23)
CO2: 17 mmol/L — ABNORMAL LOW (ref 22–32)
Calcium: 7.4 mg/dL — ABNORMAL LOW (ref 8.9–10.3)
Chloride: 113 mmol/L — ABNORMAL HIGH (ref 98–111)
Creatinine, Ser: 1.25 mg/dL — ABNORMAL HIGH (ref 0.61–1.24)
GFR, Estimated: 60 mL/min — ABNORMAL LOW (ref >60)
Glucose, Bld: 112 mg/dL — ABNORMAL HIGH (ref 70–99)
Potassium: 3.9 mmol/L (ref 3.5–5.1)
Sodium: 139 mmol/L (ref 135–145)
Total Bilirubin: 0.7 mg/dL (ref 0.3–1.2)
Total Protein: 5.2 g/dL — ABNORMAL LOW (ref 6.5–8.1)

## 2022-02-16 LAB — PHOSPHORUS: Phosphorus: 2.6 mg/dL (ref 2.5–4.6)

## 2022-02-16 MED ORDER — PSYLLIUM 95 % PO PACK
1.0000 | PACK | Freq: Two times a day (BID) | ORAL | Status: DC
  Filled 2022-02-16: qty 1

## 2022-02-16 MED ORDER — ADULT MULTIVITAMIN W/MINERALS CH
1.0000 | ORAL_TABLET | Freq: Every day | ORAL | Status: DC
  Administered 2022-02-16 – 2022-02-20 (×5): 1 via ORAL
  Filled 2022-02-16 (×5): qty 1

## 2022-02-16 MED ORDER — PSYLLIUM 95 % PO PACK
1.0000 | PACK | Freq: Two times a day (BID) | ORAL | Status: AC
  Administered 2022-02-16 (×2): 1 via ORAL
  Filled 2022-02-16 (×2): qty 1

## 2022-02-16 MED ORDER — SODIUM ACETATE 2 MEQ/ML IV SOLN: INTRAVENOUS | Status: DC

## 2022-02-16 MED ORDER — SODIUM BICARBONATE 8.4 % IV SOLN
INTRAVENOUS | Status: AC
  Filled 2022-02-16 (×2): qty 150

## 2022-02-16 MED ORDER — BOOST / RESOURCE BREEZE PO LIQD CUSTOM
1.0000 | Freq: Three times a day (TID) | ORAL | Status: DC
  Administered 2022-02-16 – 2022-02-20 (×11): 1 via ORAL

## 2022-02-16 NOTE — Progress Notes (Signed)
OT Cancellation Note ? ?Patient Details ?Name: Ryan Cowan ?MRN: [id number] ?DOB: [birth year] ? ? ?Cancelled Treatment:    Reason Eval/Treat Not Completed: Patient at procedure or test/ unavailable. Patient eating lunch. Will follow up as able. ? ?Lenward Chancellor ?02/16/2022, 3:51 PM ?

## 2022-02-16 NOTE — Progress Notes (Addendum)
Manufacturing engineer [facility]) hospital liaison note [phone] ? ?Met with PMT and family at bedside with patient. He is alert and oriented sitting in chair. Patient and family agree that patient would benefit from going to a  faciilty with [facility] hospice services. CSW is aware. [facility] will continue to help assist with any discharge needs once a facility has been chosen.  ? ?Please feel free to call with any hospice related questions. ? ?Clementeen Hoof, DNP, BSN, RN ?Hospital Liaison  ?[phone] ? ?

## 2022-02-16 NOTE — Plan of Care (Signed)

## 2022-02-16 NOTE — TOC Benefit Eligibility Note (Signed)
Patient Advocate Encounter ? ?Insurance verification completed.   ? ?The patient is currently admitted and upon discharge could be taking Dificid 200 mg tablets. ? ?The current 10 day co-pay is, $1,397.65.  ? ?The patient is insured through [facility] Part D  ? ? ? ?Lyndel Safe, CPhT ?Pharmacy Patient Advocate Specialist ?[facility] Patient Advocate Team ?Direct Number: [phone]  Fax: [phone] ? ? ? ? ? ?  ?

## 2022-02-16 NOTE — Progress Notes (Signed)
[facility] ?PROGRESS NOTE ? ? ? ?Progress Note  ?Ryan Cowan  XHB:[id number] DOB: 12/29/1945 DOA: 02/14/2022 ?PCP: Leonard Downing, MD  ? ? ? ?Brief Narrative:  ? ?Ryan Cowan is an 76 y.o. 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 difficile, which is missed several doses of his medication comes in with watery stool more than 10 bowel movements in the last 24 hours on admission. ? ? ? ?Assessment/Plan:  ? ?C. difficile colitis diarrhea: ?Bowel movements have slowed down they are more formed today last 2 have been more pasty. ?Continue IV fluids and oral vancomycin. ?Tmax of 97.7, white blood cell count of 3.0. ? ?Normal anion gap metabolic acidosis: ?Likely due to watery stools. ?We will change his fluids to bicarb with D5. ?Recheck basic metabolic panel tomorrow morning. ?We will go ahead and start on bulking agents. ? ?Hypothyroidism: ?TSH is 28 likely due to noncompliance currently on Synthroid 125 recheck TSH in 6 to 8 weeks. ? ?Acute kidney injury on chronic kidney disease stage IIIa: ?Likely pre-renal azotemia in the setting of diarrhea. ?Improving nicely with IV fluid resuscitation. ?Allow oral and IV hydration. ? ?Iron deficiency anemia due to chronic blood loss: ?Hemoglobin is stable follow-up with PCP as an outpatient. ? ?Metastatic neuroendocrine tumor: ?Palliative Care discussed with family, will need to follow-up with oncology as an outpatient. ? ?Severe protein caloric malnutrition: ?Nutrition has been consulted Ensure 3 times daily. ? ?Hypophosphatemia: ?Likely due to diarrhea, replete orally now resolved. ? ?Goals of care: ?Palliative care to meet with family discuss end-of-life. ?  ?DVT prophylaxis: lovenox ?Family Communication:none ?Status is: Inpatient ?Remains inpatient appropriate because: C. difficile colitis ? ? ? ?Code Status:  ? ?  ?Code Status Orders   ?(From admission, onward)  ?  ? ? ?  ? ?  Start     Ordered  ? 02/14/22 2213  Do not attempt resuscitation (DNR)  Continuous       ?Question Answer Comment  ?In the event of cardiac or respiratory ARREST Do not call a ?code blue?   ?In the event of cardiac or respiratory ARREST Do not perform Intubation, CPR, defibrillation or ACLS   ?In the event of cardiac or respiratory ARREST Use medication by any route, position, wound care, and other measures to relive pain and suffering. May use oxygen, suction and manual treatment of airway obstruction as needed for comfort.   ?  ? 02/14/22 2212  ? ?  ?  ? ?  ? ?Code Status History   ? ? Date Active Date Inactive Code Status Order ID Comments User Context  ? 02/18/2015 1344 02/19/2015 1707 Full Code [id number]  Sandi Mariscal, MD Inpatient  ? 02/16/2015 1529 02/18/2015 1344 Full Code [id number]  Samella Parr, NP Inpatient  ? ?  ? ?Advance Directive Documentation   ? ?Flowsheet Row Most Recent Value  ?Type of Advance Directive Healthcare Power of [place], Living will  ?Pre-existing out of facility DNR order (yellow form or pink MOST form) --  ?"MOST" Form in Place? --  ? ?  ? ? ? ? ?IV Access:  ? ?Peripheral IV ? ? ?Procedures and diagnostic studies:  ? ?CT ABDOMEN PELVIS W CONTRAST ? ?Result Date: 02/14/2022 ?CLINICAL DATA:  Diarrhea, nausea, vomiting, history of colon carcinoma EXAM: CT ABDOMEN AND PELVIS WITH CONTRAST TECHNIQUE: Multidetector CT imaging of the abdomen and pelvis was performed using  the standard protocol following bolus administration of intravenous contrast. RADIATION DOSE REDUCTION: This exam was performed according to the departmental dose-optimization program which includes automated exposure control, adjustment of the mA and/or kV according to patient size and/or use of iterative reconstruction technique. CONTRAST:  61m OMNIPAQUE IOHEXOL 300 MG/ML  SOLN COMPARISON:  06/08/2021 FINDINGS: Lower chest: No significant abnormality is seen in the visualized  lower lung fields. Hepatobiliary: There are multiple hypervascular lesions in the liver largest measuring 2.3 cm in size. There is interval appearance of new enhancing lesion with central low attenuation in the inferior aspect of right lobe of liver. There is 12 mm low-density lesion in the left lobe, possibly a cyst or hemangioma. There is mild nodularity in the liver surface. There is no dilation of bile ducts. Gallbladder is unremarkable. Pancreas: No focal abnormality is seen. Spleen: Spleen is smaller than usual in size. Adrenals/Urinary Tract: Adrenals are unremarkable. There is no hydronephrosis. Is 3.5 cm smooth marginated fluid density lesion in the lower pole of left kidney. There are no renal or ureteral stones. Urinary bladder is unremarkable. Stomach/Bowel: Small hiatal hernia is seen. Stomach is not distended. There is mild diffuse wall thickening in the distal small bowel loops. This may be related to large ascites. Appendix is not distinctly seen. There is no significant wall thickening in the colon. Vascular/Lymphatic: Scattered atherosclerotic plaques and calcifications are seen in the aorta and its major branches. There is 4.8 x 3.2 cm soft tissue mass in the mesentery in the right lower quadrant of abdomen which has not changed significantly. There is stranding in the mesentery, possibly due to prominent vasa recta. Reproductive: There is inhomogeneous attenuation in the prostate. There is ectasia of vessels in the left spermatic cord, possibly suggesting varicocele. Other: There is marked interval increase in ascites. Musculoskeletal: Degenerative changes are noted in the lumbar spine. IMPRESSION: There is massive ascites which may suggest peritoneal metastatic disease or cirrhosis. There is increase in number of enhancing lesions in the liver suggesting progression of hepatic metastatic disease. There is 4.8 x 3.2 cm soft tissue mass in the mesentery in the right lower abdomen suggesting  possible metastatic disease with no significant interval change. There is no evidence of intestinal obstruction or pneumoperitoneum. There is no hydronephrosis. Possible varicocele on the left side. Left renal cyst. Small hiatal hernia. Other findings as described in the body of the report. Electronically Signed   By: PElmer PickerM.D.   On: 02/14/2022 18:04  ? ?DG CHEST PORT 1 VIEW ? ?Result Date: 02/14/2022 ?CLINICAL DATA:  Dyspnea EXAM: PORTABLE CHEST 1 VIEW COMPARISON:  12/20/2021 FINDINGS: The heart size and mediastinal contours are within normal limits. Both lungs are clear. The visualized skeletal structures are unremarkable. IMPRESSION: No active disease. Electronically Signed   By: MRanda NgoM.D.   On: 02/14/2022 20:52   ? ? ?Medical Consultants:  ? ?None. ? ? ?Subjective:  ? ? ?Sharief P Christenberry relates his stools are more formed is that last bowel movement in the last 12 hours ? ?Objective:  ? ? ?Vitals:  ? 02/15/22 0225 02/15/22 0400805/16/23 2036 02/16/22 06761 ?BP: 110/69 105/60 127/69 125/64  ?Pulse: 70 77 (!) 53 69  ?Resp: '20 16 17 18  '$ ?Temp: 97.6 ?F (36.4 ?C) 97.7 ?F (36.5 ?C) 97.9 ?F (36.6 ?C) 97.7 ?F (36.5 ?C)  ?TempSrc:  Oral Oral Oral  ?SpO2: 100% 99% 100% 100%  ?Weight:      ?Height:      ? ?  SpO2: 100 % ? ? ?Intake/Output Summary (Last 24 hours) at 02/16/2022 1107 ?Last data filed at 02/16/2022 0029 ?Gross per 24 hour  ?Intake 682.45 ml  ?Output --  ?Net 682.45 ml  ? ?Filed Weights  ? 02/14/22 1444  ?Weight: 69.4 kg  ? ? ?Exam: ?General exam: In no acute distress, cachectic ?Respiratory system: Good air movement and clear to auscultation. ?Cardiovascular system: S1 & S2 heard, RRR. No JVD. ?Gastrointestinal system: Abdomen is nondistended, soft and nontender.  ?Extremities: No pedal edema. ?Skin: No rashes, lesions or ulcers ?Psychiatry: Judgement and insight appear normal. Mood & affect appropriate.  ? ? ?Data Reviewed:  ? ? ?Labs: ?Basic Metabolic Panel: ?Recent Labs  ?Lab  02/09/22 ?1425 02/11/22 ?1336 02/14/22 ?1458 02/15/22 ?0216 02/16/22 ?9038  ?NA 139 138 138 137 139  ?K 3.9 4.2 4.5 4.2 3.9  ?CL 110 108 110 111 113*  ?CO2 20* 21* 18* 17* 17*  ?GLUCOSE 114* 116* 109* 101* 112*  ?BUN 38* 3

## 2022-02-16 NOTE — Progress Notes (Signed)
Palliative Care Progress Note, Assessment & Plan   Patient Name: Ryan Cowan       Date: 02/16/2022 DOB: 22-Nov-1945  Age: 76 y.o. MRN#: [id number] Attending Physician: Charlynne Cousins, MD Primary Care Physician: Leonard Downing, MD Admit Date: 02/14/2022  Reason for Consultation/Follow-up: Establishing goals of care  Subjective: Pt is sitting in the recliner in no apparent distress. He acknowledges my presence and is able to make his wishes known. He reports he likes how comfortable the hospital bed. He also endorses that his BMs are starting to firm up, about like "wet cereal".   HPI: 76 y.o. male  with past medical history of hypothyroidism, CKD, former tobacco use, metastatic carcinoid tumor with ascites and C. difficile colitis admitted on 02/14/2022 with diarrhea and dehydration. Pt is receiving IV antibiotics and IVF.    Patient was schedule to have admission meeting at his home with [facility] yesterday - day of admission. PMT was consulted to discuss [place].   Summary of counseling/coordination of care: After reviewing the patient's chart and assessing the patient at bedside, I met first with patient's sisters [name] and [place], and [facility] and then we all met bedside with patient to discuss [place] and disposition.   We reviewed the difference between hospice and palliative care. We discussed that patient is not a candidate for hospice inpatient unit but rather hospice at home. Discussed philosophy of treat the treatable vs focusing on comfort. Both sisters were in agreement with patient's wishes to be kept comfortable.   Therapeutic silence and active listening provided for patient and family to share their thoughts and emotions regarding patient's current health  status. Family shares concerns that patient is not able to care for himself at home any longer. He once was regimented in his bill paying, grocery shopping, and housekeeping. They have noticed that lately he cannot remember his account numbers and his home is not as well kept as it once was. Patient says he is ok and in agreement with moving out of a home and into a facility. He states he needs to look out for himself as well as his family who is watching over him.   We reviewed that patient has colon cancer as well as C. Diff colitis - both of which contribute to his frequent BMs and GI issues. Family is aware that patient has struggled with weight loss.  Platient's goals remains clear to be kept comfortable. He wishes to be placed in a SNF/ALF/LTC. I discussed with patient and family that there are several financial and insurance aspects of qualifying for this type of care. I discussed that patient may be a candidate for rehab but that long term housing is not part of rehab. Family says they do not think he needs rehab but would like to look into ALF/LTC.   Hospice Liaison and myself shared patient/family's wishes with LCSW who is to meet with famiyl and discuss disposition options.   Goals are clear - DNR, sister [name] is patient's surrogate decision maker, and patient wishes to live in an assisted living facility with hospice.  Code Status: DNR  Prognosis: Unable to determine  Discharge Planning: To Be Determined  Recommendations/Plan: Ongoing discussions with patient and sisters regarding disposition wishes and options  Care plan was discussed with patient, patient's sisters [name] and [place], Hospice Liaison Melissa  Physical Exam Vitals reviewed.  Constitutional:      Comments: frail  HENT:     Head:     Comments: Temporal wasting    Mouth/Throat:     Mouth: Mucous membranes are moist.  Eyes:     Pupils: Pupils are equal, round, and reactive to light.  Cardiovascular:      Rate and Rhythm: Normal rate.     Pulses: Normal pulses.  Pulmonary:     Effort: Pulmonary effort is normal.  Abdominal:     General: There is distension.     Tenderness: There is no abdominal tenderness.  Musculoskeletal:        General: Normal range of motion.     Comments: MAETC  Skin:    General: Skin is warm and dry.  Neurological:     Mental Status: He is alert and oriented to person, place, and time.  Psychiatric:        Mood and Affect: Mood normal.        Behavior: Behavior normal.        Thought Content: Thought content normal.        Judgment: Judgment normal.            Palliative Assessment/Data: 60%    Total Time 50 minutes  Greater than 50%  of this time was spent counseling and coordinating care related to the above assessment and plan.  Thank you for allowing the Palliative Medicine Team to assist in the care of this patient.  [place]ka Ilsa Iha, FNP-BC Palliative Medicine Team Team Phone # [phone]

## 2022-02-16 NOTE — TOC Initial Note (Signed)
Transition of Care (TOC) - Initial/Assessment Note  ? ? ?Patient Details  ?Name: Ryan Cowan ?MRN: [id number] ?Date of Birth: 07/22/1946 ? ?Transition of Care (TOC) CM/SW Contact:    ?[place], LCSW ?Phone Number: ?02/16/2022, 2:19 PM ? ?Clinical Narrative:                 ? ?CSW notified that pt has chosen to DC to a facility with hospice. CSW met with pt and sisters [name] and [place] at bedside. CSW inquired about pt/families goals. They explained that pt would need total care with hospice services though they use the phrasing that plan is for pt to go to "hospice with palliative." Since family indicated that pt will need total care, CSW explains that SNF level of care would be the most appropriate though that medicare only cover's short term rehab. CSW explained that pt could initially go to snf for rehab (if medicare approves) and have palliative services instead of hospice while at SNF though that this would be short term. CSW explained if pt wanted to remain at snf after short term rehab then pt would need to apply for medicaid and spend down his assets in order to qualify for medicaid. Medicaid could then be used to cover LTC SNF and he could receive Hospice services. Family is not interested in pt receiving rehab services but do want a nursing home with hospice. CSW reiterated hat medicare does not cover LTC services. Family seems very confused and overwhelmed that SNF with hospice would not be covered by pt's current insurance. CSW explained other options that may be cheaper including paying privately for ALF and receiving hospice there. CSW also discussed option of pt returning home and paying privately for caregivers at home while receiving hospice services/dme there. Family continued to appear quite confused. [name] expressed that they did not want to spend pt's assets down for medicaid. She stated again that their initial understanding was that pt was going to "hospice with palliative."  Daughters were wanting to speak with PMT again. CSW explained he would notify PMT. CSW will allow family to discuss decision further to absorb insurance/financial information and CSW will follow up at later time.  ? ?Expected Discharge Plan: [place] ?Barriers to Discharge: Continued Medical Work up, Ship broker, SNF Pending bed offer ? ? ?Patient Goals and CMS Choice ?Patient states their goals for this hospitalization and ongoing recovery are:: Pt wants to go to SNF with hospice ?  ?  ? ?Expected Discharge Plan and Services ?Expected Discharge Plan: [place] ?  ?  ?  ?Living arrangements for the past 2 months: [place] ?                ?  ?  ?  ?  ?  ?  ?  ?  ?  ?  ? ?Prior Living Arrangements/Services ?Living arrangements for the past 2 months: [place] ?Lives with:: Self ?Patient language and need for interpreter reviewed:: Yes ?       ?Need for Family Participation in Patient Care: Yes (Comment) ?Care giver support system in place?: No (comment) ?  ?Criminal Activity/Legal Involvement Pertinent to Current Situation/Hospitalization: No - Comment as needed ? ?Activities of Daily Living ?Home Assistive Devices/Equipment: Eyeglasses, Kasandra Knudsen (specify quad or straight), Walker (specify type) ?ADL Screening (condition at time of admission) ?Patient's cognitive ability adequate to safely complete daily activities?: Yes ?Is the patient deaf or have difficulty hearing?: Yes (ears pop  when pt swallows at times) ?Does the patient have difficulty seeing, even when wearing glasses/contacts?: No (suppose to get new glasses from [place] in [place]) ?Does the patient have difficulty concentrating, remembering, or making decisions?: Yes ?Patient able to express need for assistance with ADLs?: Yes ?Does the patient have difficulty dressing or bathing?: Yes ?Independently performs ADLs?: No ?Communication: Independent ?Dressing (OT): Needs assistance ?Is this a change  from baseline?: Change from baseline, expected to last >3 days ?Grooming: Independent ?Feeding: Independent ?Bathing: Needs assistance ?Is this a change from baseline?: Change from baseline, expected to last >3 days ?Toileting: Needs assistance ?Is this a change from baseline?: Change from baseline, expected to last >3days ?In/Out Bed: Needs assistance ?Is this a change from baseline?: Change from baseline, expected to last >3 days ?Walks in Home: Needs assistance ?Is this a change from baseline?: Change from baseline, expected to last >3 days ?Does the patient have difficulty walking or climbing stairs?: Yes ?Weakness of Legs: Both ?Weakness of Arms/Hands: Both ? ?Permission Sought/Granted ?  ?Permission granted to share information with : Yes, Verbal Permission Granted ? Share Information with NAME: Sisters ?   ?   ?   ? ?Emotional Assessment ?Appearance:: Appears stated age ?Attitude/Demeanor/Rapport: Engaged ?Affect (typically observed): Accepting ?Orientation: : Oriented to Self, Oriented to Place, Oriented to  Time, Oriented to Situation ?Alcohol / Substance Use: Not Applicable ?Psych Involvement: No (comment) ? ?Admission diagnosis:  Malignant ascites [R18.0] ?C. difficile colitis [A04.72] ?Metastatic carcinoid tumor ([place]) [C7B.00] ?Clostridioides difficile diarrhea [A04.72] ?Patient Active Problem List  ? Diagnosis Date Noted  ? Hypothyroidism 02/15/2022  ? CKD (chronic kidney disease), stage III ([place]) 02/15/2022  ? C. difficile colitis 02/15/2022  ? Clostridioides difficile diarrhea 02/14/2022  ? DNR (do not resuscitate) 12/20/2021  ? Carcinoid tumor of colon, malignant ([place]) 04/03/2015  ? Iron deficiency anemia due to chronic blood loss 02/27/2015  ? Protein-calorie malnutrition, severe ([place]) 02/17/2015  ? Liver metastases   ? Symptomatic anemia 02/16/2015  ? DOE (dyspnea on exertion) 02/16/2015  ? Thrombocytosis 02/16/2015  ? Chronic diarrhea 02/16/2015  ? Weight loss, non-intentional 02/16/2015  ? Former  tobacco use 02/16/2015  ? Hypoalbuminemia due to protein-calorie malnutrition ([place]) 02/16/2015  ? ?PCP:  Leonard Downing, MD ?Pharmacy:   ?[facility] [address] ?[place] [address] ?Phone: [phone] Fax: [phone] ? ?[facility] - [address], [place] ?[address] ?[place] [address] ?Phone: [phone] Fax: [phone] ? ? ? ? ?Social Determinants of Health (SDOH) Interventions ?  ? ?Readmission Risk Interventions ?   ? View : No data to display.  ?  ?  ?  ? ? ? ?

## 2022-02-16 NOTE — Progress Notes (Signed)
Initial Nutrition Assessment ? ?INTERVENTION:  ? ?-Boost Breeze po TID, each supplement provides 250 kcal and 9 grams of protein ? ?-Multivitamin with minerals daily ? ?NUTRITION DIAGNOSIS:  ? ?Increased nutrient needs related to acute illness, chronic illness as evidenced by estimated needs. ? ?GOAL:  ? ?Patient will meet greater than or equal to 90% of their needs ? ?MONITOR:  ? ?PO intake, Supplement acceptance, Labs, Weight trends, I & O's ? ?REASON FOR ASSESSMENT:  ? ?Consult ?Assessment of nutrition requirement/status ? ?ASSESSMENT:  ? ?76 y.o. male past medical history of metastatic carcinoid tumor with ascites, hypothyroidism chronic kidney disease stage III,  recently started on oral vancomycin for C. difficile, which is missed several doses of his medication comes in with watery stool more than 10 bowel movements in the last 24 hours on admission. ? ?Patient unavailable x 2 attempts to see today. Pt had [place] meeting this morning, family would like hospice following at SNF per chart review.  ?Currently pt not eating much, is c.diff+.  ?Will add Boost Breeze supplements for additional kcals and protein.  ? ?Per home medications pt takes Vitamin D, calcium, B-12 and daily MVI. ? ?Per weight records, weight has increased since March. Lowest weight recorded was on 2/27, 128 lbs. ?Current weight: 153 lbs. ? ?Per nursing documentation, pt with moderate BLE and sacral edema. ? ?Medications: K-Phos, KLOR-CON, Metamucil, sodium bicarb in D5 infusion ? ?Labs reviewed. ?  ?NUTRITION - FOCUSED PHYSICAL EXAM: ? ?Unable to complete at this time ? ?Diet Order:   ?Diet Order   ? ?       ?  Diet Heart Room service appropriate? Yes; Fluid consistency: Thin  Diet effective now       ?  ? ?  ?  ? ?  ? ? ?EDUCATION NEEDS:  ? ?No education needs have been identified at this time ? ?Skin:  Skin Assessment: Reviewed RN Assessment ? ?Last BM:  5/17 -type 7 ? ?Height:  ? ?Ht Readings from Last 1 Encounters:  ?02/14/22 '6\' 1"'$  (1.854 m)   ? ? ?Weight:  ? ?Wt Readings from Last 1 Encounters:  ?02/14/22 69.4 kg  ? ? ?BMI:  Body mass index is 20.19 kg/m?. ? ?Estimated Nutritional Needs:  ? ?Kcal:  1800-2000 ? ?Protein:  85-95g ? ?Fluid:  2L/day ? ?Clayton Bibles, MS, RD, LDN ?Inpatient Clinical Dietitian ?Contact information available via Amion ? ?

## 2022-02-17 DIAGNOSIS — R180 Malignant ascites: Secondary | ICD-10-CM

## 2022-02-17 DIAGNOSIS — Z66 Do not resuscitate: Secondary | ICD-10-CM

## 2022-02-17 DIAGNOSIS — E8809 Other disorders of plasma-protein metabolism, not elsewhere classified: Secondary | ICD-10-CM | POA: Diagnosis not present

## 2022-02-17 DIAGNOSIS — C7A029 Malignant carcinoid tumor of the large intestine, unspecified portion: Secondary | ICD-10-CM | POA: Diagnosis not present

## 2022-02-17 DIAGNOSIS — Z515 Encounter for palliative care: Secondary | ICD-10-CM

## 2022-02-17 DIAGNOSIS — R634 Abnormal weight loss: Secondary | ICD-10-CM | POA: Diagnosis not present

## 2022-02-17 DIAGNOSIS — A0472 Enterocolitis due to Clostridium difficile, not specified as recurrent: Secondary | ICD-10-CM | POA: Diagnosis not present

## 2022-02-17 LAB — BASIC METABOLIC PANEL
Anion gap: 6 (ref 5–15)
BUN: 18 mg/dL (ref 8–23)
CO2: 21 mmol/L — ABNORMAL LOW (ref 22–32)
Calcium: 7 mg/dL — ABNORMAL LOW (ref 8.9–10.3)
Chloride: 109 mmol/L (ref 98–111)
Creatinine, Ser: 1.13 mg/dL (ref 0.61–1.24)
GFR, Estimated: >60 mL/min (ref >60)
Glucose, Bld: 122 mg/dL — ABNORMAL HIGH (ref 70–99)
Potassium: 3.6 mmol/L (ref 3.5–5.1)
Sodium: 136 mmol/L (ref 135–145)

## 2022-02-17 LAB — URINALYSIS, ROUTINE W REFLEX MICROSCOPIC
Bacteria, UA: NONE SEEN
Bilirubin Urine: NEGATIVE
Glucose, UA: NEGATIVE mg/dL
Hgb urine dipstick: NEGATIVE
Ketones, ur: NEGATIVE mg/dL
Leukocytes,Ua: NEGATIVE
Nitrite: NEGATIVE
Protein, ur: 30 mg/dL — AB
Specific Gravity, Urine: 1.026 (ref 1.005–1.030)
pH: 6 (ref 5.0–8.0)

## 2022-02-17 LAB — GLUCOSE, CAPILLARY: Glucose-Capillary: 123 mg/dL — ABNORMAL HIGH (ref 70–99)

## 2022-02-17 MED ORDER — FIDAXOMICIN 200 MG PO TABS
200.0000 mg | ORAL_TABLET | Freq: Two times a day (BID) | ORAL | Status: DC
  Administered 2022-02-17 – 2022-02-20 (×7): 200 mg via ORAL
  Filled 2022-02-17 (×7): qty 1

## 2022-02-17 NOTE — TOC Benefit Eligibility Note (Signed)
Patient Advocate Encounter  Patient is approved through the [employer] Patient Assistance Program for [place] through 10/02/2022.   Will be mailed to Patient's Home.   Lyndel Safe, [place] Patient Advocate Specialist [place] Patient Advocate Team Direct Number: [phone]  Fax: [phone]

## 2022-02-17 NOTE — Progress Notes (Signed)
Occupational Therapy Treatment Patient Details Name: Ryan Cowan MRN: [id number] DOB: 1946-06-19 Today's Date: 02/17/2022   History of present illness Ryan Cowan is a 76 y.o. 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   OT comments  Patient has been independently transferring to [facility] and managing toileting -having excessive bowel movements secondary to c.diff. During treatment patient demonstrated independence with toileting, standing at sink for grooming and ability to don lower body clothing. Patient reports improving strength but reports concerns in regards to managing home and IADLs. Feel HH is appropriate discharge if patient has intermittent assistance from family to assist with cleaning, meals, grocery shopping. If family unable to assist patient - patient may benefit from SNF. He exhibits good rehab potential.   Recommendations for follow up therapy are one component of a multi-disciplinary discharge planning process, led by the attending physician.  Recommendations may be updated based on patient status, additional functional criteria and insurance authorization.    Follow Up Recommendations  Home health OT    Assistance Recommended at Discharge Intermittent Supervision/Assistance  Patient can return home with the following  A little help with bathing/dressing/bathroom;Assistance with cooking/housework   Equipment Recommendations  BSC/3in1    Recommendations for Other Services      Precautions / Restrictions Precautions Precautions: None Precaution Comments: C DIFF, wearing pullups Restrictions Weight Bearing Restrictions: No       Mobility Bed Mobility                    Transfers                         Balance Overall balance assessment: Mild deficits observed, not  formally tested                                         ADL either performed or assessed with clinical judgement   ADL Overall ADL's : Needs assistance/impaired     Grooming: Supervision/safety;Standing Grooming Details (indicate cue type and reason): washed hands at sink     Lower Body Bathing: Supervison/ safety;Sit to/from stand Lower Body Bathing Details (indicate cue type and reason): supervision to don pants and shoes         Toilet Transfer: Independent;BSC/3in1 Armed forces technical officer Details (indicate cue type and reason): Patient has been independently yransferring to [place] and Hygiene: [place] Details (indicate cue type and reason): patient has been independently performing toileting in room.     Functional mobility during ADLs: Min guard General ADL Comments: min guard to ambulate in room without a deivce. No overt loss of balance. Patinet reports feeling improved strength in lower extremeties.    Extremity/Trunk Assessment Upper Extremity Assessment Upper Extremity Assessment: Overall WFL for tasks assessed   Lower Extremity Assessment Lower Extremity Assessment: Defer to PT evaluation        Vision Baseline Vision/History: 1 Wears glasses Patient Visual Report: No change from baseline     Perception     Praxis      Cognition Arousal/Alertness: Awake/alert Behavior During Therapy: WFL for tasks assessed/performed Overall Cognitive Status: Within Functional Limits for tasks assessed  Exercises      Shoulder Instructions       General Comments      Pertinent Vitals/ Pain       Pain Assessment Pain Assessment: No/denies pain  Home Living                                          Prior Functioning/Environment              Frequency  Min 2X/week        Progress Toward  Goals  OT Goals(current goals can now be found in the care plan section)  Progress towards OT goals: Progressing toward goals  Acute Rehab OT Goals Patient Stated Goal: get stronger OT Goal Formulation: With patient Time For Goal Achievement: 03/01/22 Potential to Achieve Goals: Good  Plan Discharge plan remains appropriate    Co-evaluation                 AM-PAC OT "6 Clicks" Daily Activity     Outcome Measure   Help from another person eating meals?: None Help from another person taking care of personal grooming?: A Little Help from another person toileting, which includes using toliet, bedpan, or urinal?: None Help from another person bathing (including washing, rinsing, drying)?: A Little Help from another person to put on and taking off regular upper body clothing?: A Little Help from another person to put on and taking off regular lower body clothing?: A Little 6 Click Score: 20    End of Session    OT Visit Diagnosis: Unsteadiness on feet (R26.81);Muscle weakness (generalized) (M62.81);Pain   Activity Tolerance Patient tolerated treatment well   Patient Left in chair   Nurse Communication Mobility status        Time: 3382-5053 OT Time Calculation (min): 41 min  Charges: OT General Charges $OT Visit: 1 Visit OT Treatments $Self Care/Home Management : 23-37 mins  Ryan Cowan, OTR/L [place]  Office [phone] Pager: [place] 02/17/2022, 4:12 PM

## 2022-02-17 NOTE — Progress Notes (Signed)
After reviewing patient's chart, I spoke with hospice Liaison, bedside nurse, and social worker.  Social worker had extensive discussions with patient's Sister [name] yesterday.  [name] shares the plan was set to go "hospice and palliative".    Given mixed messages from family despite medical team's uniform recommendations, I recommended that we all meet together to again discuss disposition and goals of care.  Patient's sister plans to be at the hospital at 1 PM.  Nursing is going to make medical team aware and we will be able to meet again to discuss plans moving forward.  [place] Ilsa Iha, FNP-BC Palliative Medicine Team Team Phone # [phone]  NO CHARGE

## 2022-02-17 NOTE — Progress Notes (Addendum)
Justus Droke Stopper   DOB:[birth year]   ME#:[id number]   [id number]  Medical oncology follow-up  Subjective: Sitting up in the recliner today.  Reports improvement in his loose stools.  Reports that his abdomen feels full at times but no pain today.  No nausea or vomiting.   Objective:  Vitals:   02/16/22 2133 02/17/22 0630  BP: (!) 109/59 98/61  Pulse: 68 61  Resp: 16 20  Temp: 97.7 F (36.5 C) (!) 97.3 F (36.3 C)  SpO2: 100% 100%    Body mass index is 20.19 kg/m.  Intake/Output Summary (Last 24 hours) at 02/17/2022 0820 Last data filed at 02/17/2022 0530 Gross per 24 hour  Intake 1129.55 ml  Output --  Net 1129.55 ml     Sclerae unicteric, cachectic appearing  Oropharynx clear  No peripheral adenopathy  Lungs clear -- no rales or rhonchi  Heart regular rate and rhythm  Abdomen soft, mild diffuse tenderness.  MSK no focal spinal tenderness, moderate pitting edema on bilateral lower extremity below knee  Neuro nonfocal    CBG (last 3)  No results for input(s): GLUCAP in the last 72 hours.   Labs:  Urine Studies No results for input(s): UHGB, CRYS in the last 72 hours.  Invalid input(s): UACOL, UAPR, USPG, UPH, UTP, UGL, UKET, UBIL, UNIT, UROB, [place], UEPI, UWBC, [place], [place], [place], [place], [place]  Basic Metabolic Panel: Recent Labs  Lab 02/11/22 1336 02/14/22 1458 02/15/22 0216 02/16/22 0523  NA 138 138 137 139  K 4.2 4.5 4.2 3.9  CL 108 110 111 113*  CO2 21* 18* 17* 17*  GLUCOSE 116* 109* 101* 112*  BUN 32* 27* 27* 23  CREATININE 1.61* 1.48* 1.39* 1.25*  CALCIUM 7.9* 8.2* 7.4* 7.4*  MG  --   --  1.9  --   PHOS  --   --  2.4* 2.6   GFR Estimated Creatinine Clearance: 49.4 mL/min (A) (by C-G formula based on SCr of 1.25 mg/dL (H)). Liver Function Tests: Recent Labs  Lab 02/11/22 1336 02/14/22 1458 02/15/22 0216 02/16/22 0523  AST '21 22 20 19  '$ ALT '13 16 13 13  '$ ALKPHOS 76 73 64 62  BILITOT 0.3 0.7 0.6 0.7  PROT 6.4* 6.2* 5.4* 5.2*  ALBUMIN 3.1* 2.6*  2.3* 2.2*   Recent Labs  Lab 02/14/22 1458  LIPASE 40   No results for input(s): AMMONIA in the last 168 hours. Coagulation profile No results for input(s): INR, PROTIME in the last 168 hours.  CBC: Recent Labs  Lab 02/11/22 1336 02/14/22 1458 02/15/22 0216  WBC 3.8* 3.3* 3.0*  NEUTROABS 3.2  --   --   HGB 10.3* 10.1* 9.5*  HCT 32.0* 31.3* 29.4*  MCV 93.0 96.0 94.8  PLT 148* 163 148*   Cardiac Enzymes: Recent Labs  Lab 02/15/22 0216  CKTOTAL 75   BNP: Invalid input(s): POCBNP CBG: No results for input(s): GLUCAP in the last 168 hours. D-Dimer No results for input(s): DDIMER in the last 72 hours. Hgb A1c No results for input(s): HGBA1C in the last 72 hours. Lipid Profile No results for input(s): CHOL, HDL, LDLCALC, TRIG, CHOLHDL, LDLDIRECT in the last 72 hours. Thyroid function studies Recent Labs    02/15/22 0216  TSH 28.733*   Anemia work up No results for input(s): VITAMINB12, FOLATE, FERRITIN, TIBC, IRON, RETICCTPCT in the last 72 hours. Microbiology Recent Results (from the past 240 hour(s))  Gastrointestinal Panel by PCR , Stool     Status: None  Collection Time: 02/09/22  4:26 PM   Specimen: Stool  Result Value Ref Range Status   Campylobacter species NOT DETECTED NOT DETECTED Final   Plesimonas shigelloides NOT DETECTED NOT DETECTED Final   Salmonella species NOT DETECTED NOT DETECTED Final   Yersinia enterocolitica NOT DETECTED NOT DETECTED Final   Vibrio species NOT DETECTED NOT DETECTED Final   Vibrio cholerae NOT DETECTED NOT DETECTED Final   Enteroaggregative E coli (EAEC) NOT DETECTED NOT DETECTED Final   Enteropathogenic E coli (EPEC) NOT DETECTED NOT DETECTED Final   Enterotoxigenic E coli (ETEC) NOT DETECTED NOT DETECTED Final   Shiga like toxin producing E coli (STEC) NOT DETECTED NOT DETECTED Final   Shigella/Enteroinvasive E coli (EIEC) NOT DETECTED NOT DETECTED Final   Cryptosporidium NOT DETECTED NOT DETECTED Final   Cyclospora  cayetanensis NOT DETECTED NOT DETECTED Final   Entamoeba histolytica NOT DETECTED NOT DETECTED Final   Giardia lamblia NOT DETECTED NOT DETECTED Final   Adenovirus F40/41 NOT DETECTED NOT DETECTED Final   Astrovirus NOT DETECTED NOT DETECTED Final   Norovirus GI/GII NOT DETECTED NOT DETECTED Final   Rotavirus A NOT DETECTED NOT DETECTED Final   Sapovirus (I, II, IV, and V) NOT DETECTED NOT DETECTED Final    Comment: Performed at [facility], [address] [place], [place] [address]  C difficile quick screen w PCR reflex     Status: Abnormal   Collection Time: 02/09/22  4:31 PM   Specimen: STOOL  Result Value Ref Range Status   C Diff antigen POSITIVE (A) NEGATIVE Final   C Diff toxin POSITIVE (A) NEGATIVE Final   C Diff interpretation Toxin producing C. difficile detected.  Final    Comment: CRITICAL RESULT CALLED TO, READ BACK BY AND VERIFIED WITH: NORMAN,A. RN '@[id number]'$  ON 02/09/2022 BY COHEN,K Performed at [facility], [place] [address] [place], [place] [address]       Studies:  No results found.  Assessment: 76 y.o. male   C. difficile colitis, was on outpatient oral vancomycin 2.  Severe diarrhea, secondary to C. difficile colitis and neuroendocrine tumor 3.  Symptomatic anemia 4.  Metastatic neuroendocrine tumor 5.  Moderate protein and calorie malnutrition 6. Leg edema   Plan:  -On Dificid per primary team for C. difficile.  Symptoms are improving. -continue supportive care  -The care is following and discussing home with hospice versus rehab.  Conversations are ongoing. -He is DNR  Mikey Bussing, NP 02/17/2022  8:20 AM  Addendum I saw Akon this morning.  He was on the side commode.  He still have diarrhea, oral vancomycin has been changed to Dificid yesterday.  I think his diarrhea is partially related to his neuroendocrine tumor.  After adequate C. difficile treatment, we can start him on Imodium and Lomotil.  I would also recommend  octreotide injection to slow down his diarrhea.  Patient has decided to go to SNF for long-term, along with hospice.  He will apply for Medicaid.  I hope his diarrhea can be better controlled before he will be discharged to SNF.  Follow-up as needed.  I plan to cancel his [place] treatment and follow-up with [place].  He knows to call me if needed.  [name]  02/18/2022

## 2022-02-17 NOTE — TOC Progression Note (Signed)
Transition of Care [facility]) - Progression Note    Patient Details  Name: Ryan Cowan MRN: [id number] Date of Birth: 06/07/1946  Transition of Care [facility]) CM/SW [place], [place] Phone Number: 02/17/2022, 2:40 PM  Clinical Narrative:     CSW met with pt and pt sister Ryan Cowan along with PMT and [facility] liaison. Discussed different care options available if pt were receiving hospice care. Pt and sister understand all options would have out of pocket costs. Pt expresses that he is not concerned with money. He states his sister has access to his assets. Pt and sister agree they want to pursue SNF under private pay with hospice services. They had to pay private pay for another family member in the past so are familiar with the costs. Pt and sister indicate they have interest in [facility] if possible. CSW completed fl2 and faxed bed requests in hub for private pay SNF.    Expected Discharge Plan: [place] Barriers to Discharge: Continued Medical Work up, Ship broker, SNF Pending bed offer  Expected Discharge Plan and Services Expected Discharge Plan: [place] arrangements for the past 2 months: Single Family Home                                       Social Determinants of Health (SDOH) Interventions    Readmission Risk Interventions     View : No data to display.

## 2022-02-17 NOTE — Progress Notes (Addendum)
Palliative Care Progress Note, Assessment & Plan   Patient Name: Ryan Cowan       Date: 02/17/2022 DOB: 04/19/1946  Age: 76 y.o. MRN#: [id number] Attending Physician: Charlynne Cousins, MD Primary Care Physician: Leonard Downing, MD Admit Date: 02/14/2022  Reason for Consultation/Follow-up: Disposition and Establishing goals of care  Subjective: Pt is sitting in bed in no apparent distress.  He acknowledges my presence and is able to make his wishes known.  He has no acute complaints of pain or abdominal discomfort at this time. His sister [name] is at bedside.   HPI: 76 y.o. male  with past medical history of hypothyroidism, CKD, former tobacco use, metastatic carcinoid tumor with ascites and C. difficile colitis admitted on 02/14/2022 with diarrhea and dehydration. Pt is receiving IV antibiotics and IVF.    Patient was schedule to have admission meeting at his home with [facility] D/t hospitalization, PMT was consulted to discuss [place].   Summary of counseling/coordination of care: After reviewing patient's chart and assessing the patient at bedside, I met with patient patient's Sister [name] social worker Silverio Lay and [place] care hospice liaison Anderson Malta to discuss goals of care and disposition.  I attempted to ellicit goals and wishes important to the patient. Patient vocalized that he would like to move to a long-term care facility.  He is ready to "give up the house". He says he "knows where I am going - with the Lord".   Social worker discussed that long-term care facilities without Medicaid or long-term care insurance would require patient to private pay.  Details of disposition discussed in detail.  Goal are clear. DNR remains. Sister [name] is Air traffic controller.   Plan  is for patient to work on financial portion of disposition, and when placement is approved he will meet with [facility] to determine whether or not he would like to move forward with palliative or hospice services outpatient.  Hospice liaison aware that outpatient palliative consult will be placed at discharge.  Questions and concerns were addressed.  Patient's Sister [name] confirmed understanding using teach back method.  Code Status: DNR  Prognosis: Unable to determine  Discharge Planning: LTC - private pay  Care plan was discussed with patient, patient's sister [name], Hospice Liaison [place], [place]  Physical Exam Vitals reviewed.  HENT:     Head:     Comments: Temporal wasting    Mouth/Throat:     Mouth: Mucous membranes are moist.  Eyes:     Pupils: Pupils are equal, round, and reactive to light.  Cardiovascular:     Rate and Rhythm: Normal rate.     Pulses: Normal pulses.  Pulmonary:     Effort: Pulmonary effort is normal.  Abdominal:     Palpations: Abdomen is soft.  Musculoskeletal:     Comments: Generalized weakness  Skin:    General: Skin is warm.  Neurological:     Mental Status: He is alert. Mental status is at baseline.  Psychiatric:        Mood and Affect: Mood normal.        Behavior: Behavior normal.            Palliative Assessment/Data: 60%  Total Time 35 minutes  Greater than 50%  of this time was spent counseling and coordinating care related to the above assessment and plan.  Thank you for allowing the Palliative Medicine Team to assist in the care of this patient.  [place] Ilsa Iha, FNP-BC Palliative Medicine Team Team Phone # [phone]

## 2022-02-17 NOTE — NC FL2 (Signed)
[place] MEDICAID FL2 LEVEL OF CARE SCREENING TOOL     IDENTIFICATION  Patient Name: Ryan Cowan Birthdate: 1945-11-02 Sex: male Admission Date (Current Location): 02/14/2022  [facility] and [place] Number:  Herbalist and Address:  [facility] [place], [place] [address], [place], [place] [address]      Provider Number: [id number]  Attending Physician Name and Address:  Charlynne Cousins, MD  Relative Name and Phone Number:  [name] (Sister)   [phone] [facility])    Current Level of Care: Hospital Recommended Level of Care: [place] Prior Approval Number:    Date Approved/Denied:   PASRR Number:    Discharge Plan: SNF    Current Diagnoses: Patient Active Problem List   Diagnosis Date Noted   Hypothyroidism 02/15/2022   CKD (chronic kidney disease), stage III ([place]) 02/15/2022   C. difficile colitis 02/15/2022   Clostridioides difficile diarrhea 02/14/2022   DNR (do not resuscitate) 12/20/2021   Carcinoid tumor of colon, malignant ([place]) 04/03/2015   Iron deficiency anemia due to chronic blood loss 02/27/2015   Protein-calorie malnutrition, severe ([place]) 02/17/2015   Liver metastases    Symptomatic anemia 02/16/2015   DOE (dyspnea on exertion) 02/16/2015   Thrombocytosis 02/16/2015   Chronic diarrhea 02/16/2015   Weight loss, non-intentional 02/16/2015   Former tobacco use 02/16/2015   Hypoalbuminemia due to protein-calorie malnutrition ([place]) 02/16/2015    Orientation RESPIRATION BLADDER Height & Weight     Self, Time, Situation, Place  Normal Incontinent Weight: 153 lb (69.4 kg) Height:  '6\' 1"'$  (185.4 cm)  BEHAVIORAL SYMPTOMS/MOOD NEUROLOGICAL BOWEL NUTRITION STATUS      Incontinent Diet (See contact precautions)  AMBULATORY STATUS COMMUNICATION OF NEEDS Skin   Limited Assist Verbally Normal                       Personal Care Assistance Level of Assistance  Bathing, Feeding, Dressing Bathing  Assistance: Limited assistance   Dressing Assistance: Limited assistance     Functional Limitations Info  Sight, Hearing, Speech Sight Info: Adequate Hearing Info: Adequate Speech Info: Adequate    SPECIAL CARE FACTORS FREQUENCY   (Hospice with [facility])                    Contractures Contractures Info: Not present    Additional Factors Info  Code Status, Allergies, Isolation Precautions Code Status Info: DNR Allergies Info: Everolimus, codeine     Isolation Precautions Info: Enteric Precautions     Current Medications (02/17/2022):  This is the current hospital active medication list Current Facility-Administered Medications  Medication Dose Route Frequency Provider Last Rate Last Admin   acetaminophen (TYLENOL) tablet 650 mg  650 mg Oral Q6H PRN Toy Baker, MD   650 mg at 02/15/22 0244   Or   acetaminophen (TYLENOL) suppository 650 mg  650 mg Rectal Q6H PRN Toy Baker, MD       feeding supplement (BOOST / RESOURCE BREEZE) liquid 1 Container  1 Container Oral TID BM Charlynne Cousins, MD   1 Container at 02/17/22 1307   fidaxomicin (DIFICID) tablet 200 mg  200 mg Oral BID Charlynne Cousins, MD   200 mg at 02/17/22 1043   levothyroxine (SYNTHROID) tablet 125 mcg  125 mcg Oral Q0600 Oswald Hillock, MD   125 mcg at 02/17/22 9735   multivitamin with minerals tablet 1 tablet  1 tablet Oral Daily Charlynne Cousins, MD   1 tablet  at 02/17/22 1003   potassium chloride SA (KLOR-CON M) CR tablet 20 mEq  20 mEq Oral BID Toy Baker, MD   20 mEq at 02/17/22 1003   sertraline (ZOLOFT) tablet 100 mg  100 mg Oral Daily Doutova, Anastassia, MD   100 mg at 02/17/22 1003   Telotristat Ethyl(as Etiprate) TABS 250 mg  250 mg Oral TID Toy Baker, MD         Discharge Medications: Please see discharge summary for a list of discharge medications.  Relevant Imaging Results:  Relevant Lab Results:   Additional Information SSN [id number]   Pt will need SNF (private pay) with Hospice services.  [place], LCSW

## 2022-02-17 NOTE — TOC Progression Note (Signed)
Transition of Care [facility]) - Progression Note    Patient Details  Name: Ryan Cowan MRN: [id number] Date of Birth: [birth year]  Transition of Care [facility]) CM/SW [place], [place] Phone Number: 02/17/2022, 11:27 AM  Clinical Narrative:     CSW met with pt at bedside; family not there. CSW inquired if PMT spoke with family again yesterday. He said someone came but that indicates that he does not have any indication on what his d/c plans will be. He is pleasant but is unable to identify a plan at this point whether its hospice at alf, SNF, Home, etc. He states his sisters will probably arrive later in the afternoon.   CSW called pt's sister [name] and inquired about the afternoon discussion with PMT yesterday. She states "we are still waiting on an answer" and "we haven't talked to anyone." CSW inquires what she means when she says she is waiting on an answer. She states "they said he was going to go to palliative and hospice." CSW again explains the options for pt if discharge with hospice services. CSW explained that if family wants some version of LTC for pt and is unable to take care of pt themselves, there would be out of pocket costs. [name] explains that what CSW is saying is different then what PMT and Melissa with [facility] is saying. CSW made [name] aware that he had been Communicating with PMT and Melissa and that they were not communicating different things. [name] mentions [employer] and "other facilities like that." CSW explained it is his understanding that pt does not qualify for a [place] as pt would have to be within 2 weeks of end of life. [name] continues to be confused and requests to talk with Melissa. At end of call [name] explains "well he will just have to go home then and take care of himself." She reiterates that family can't take care of him at home. She states she is coming to hospital  around 1 pm today.   CSW secure chatted PMT NP and  Melissa([facility]) and updated on this conversation. Curt Bears PMT NP confirms again that she is communicating the same information as CSW. We plan on speaking with Theresa/family together when they arrive at hospital.   Expected Discharge Plan: [place] Barriers to Discharge: Continued Medical Work up, Ship broker, SNF Pending bed offer  Expected Discharge Plan and Services Expected Discharge Plan: [place] arrangements for the past 2 months: Single Family Home                                       Social Determinants of Health (SDOH) Interventions    Readmission Risk Interventions     View : No data to display.

## 2022-02-17 NOTE — Plan of Care (Signed)

## 2022-02-17 NOTE — Progress Notes (Signed)
PT Cancellation Note  Patient Details Name: Ryan Cowan MRN: [id number] DOB: [birth year]   Cancelled Treatment:    Reason Eval/Treat Not Completed: Fatigue/lethargy limiting ability to participate, patient was in bed sleeping when PT  arrived earlier in AM. Patient reports getting to [facility]  but not ready to get. Will check back another time.  [place] Pager [phone] Office [phone]    Claretha Cooper 02/17/2022, 12:42 PM

## 2022-02-17 NOTE — Progress Notes (Signed)
[facility] PROGRESS NOTE    Progress Note  Ryan Cowan  ZOX:[id number] DOB: March 07, 1946 DOA: 02/14/2022 PCP: Leonard Downing, MD     Brief Narrative:   Ryan Cowan is an 76 y.o. 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 difficile, which is missed several doses of his medication comes in with watery stool more than 10 bowel movements in the last 24 hours on admission.  Assessment/Plan:   C. difficile colitis diarrhea: Relates his bowel movements are more formed. KVO IV fluids, transition to oral Dificid, due to difficulty with compliance with vancomycin. Has remained afebrile. Physical therapy evaluated the patient recommended rehab, family wants long-term skilled nursing facility with hospice.  Normal anion gap metabolic acidosis: Likely due to watery stools. Continue the 5 with bicarbonate. Basic metabolic panels pending this morning.  Hypothyroidism: TSH is 28 likely due to noncompliance currently on Synthroid 125 recheck TSH in 6 to 8 weeks.  Acute kidney injury on chronic kidney disease stage IIIa: Likely pre-renal azotemia in the setting of diarrhea. KVO IV fluids.  Iron deficiency anemia due to chronic blood loss: Hemoglobin is stable follow-up with PCP as an outpatient.  Metastatic neuroendocrine tumor: Palliative Care discussed with family, will need to follow-up with oncology as an outpatient.  Severe protein caloric malnutrition: Nutrition has been consulted Ensure 3 times daily.  Hypophosphatemia: Likely due to diarrhea, replete orally now resolved.  Goals of care: Palliative care to meet with family discuss end-of-life.   DVT prophylaxis: lovenox Family Communication:none Status is: Inpatient Remains inpatient appropriate because: C. difficile colitis    Code Status:     Code Status Orders  (From admission,  onward)           Start     Ordered   02/14/22 2213  Do not attempt resuscitation (DNR)  Continuous       Question Answer Comment  In the event of cardiac or respiratory ARREST Do not call a "code blue"   In the event of cardiac or respiratory ARREST Do not perform Intubation, CPR, defibrillation or ACLS   In the event of cardiac or respiratory ARREST Use medication by any route, position, wound care, and other measures to relive pain and suffering. May use oxygen, suction and manual treatment of airway obstruction as needed for comfort.      02/14/22 2212           Code Status History     Date Active Date Inactive Code Status Order ID Comments User Context   02/18/2015 1344 02/19/2015 1707 Full Code [id number]  Sandi Mariscal, MD Inpatient   02/16/2015 1529 02/18/2015 1344 Full Code [id number]  Samella Parr, NP Inpatient      Advance Directive Documentation    Flowsheet Row Most Recent Value  Type of Advance Directive Healthcare Power of Attorney, Living will  Pre-existing out of facility DNR order (yellow form or pink MOST form) --  "MOST" Form in Place? --         IV Access:   Peripheral IV   Procedures and diagnostic studies:   No results found.   Medical Consultants:   None.   Subjective:    Ryan Cowan bowel movements are improved today.  Objective:    Vitals:   02/16/22 0623 02/16/22 1504 02/16/22 2133 02/17/22 0630  BP: 125/64 118/72 (!) 109/59 98/61  Pulse: 69 (!) 55 68  61  Resp: '18 18 16 20  '$ Temp: 97.7 F (36.5 C) 97.8 F (36.6 C) 97.7 F (36.5 C) (!) 97.3 F (36.3 C)  TempSrc: Oral Oral Oral Oral  SpO2: 100% 100% 100% 100%  Weight:      Height:       SpO2: 100 %   Intake/Output Summary (Last 24 hours) at 02/17/2022 0914 Last data filed at 02/17/2022 0530 Gross per 24 hour  Intake 1129.55 ml  Output --  Net 1129.55 ml    Filed Weights   02/14/22 1444  Weight: 69.4 kg    Exam: General exam: In no acute  distress. Respiratory system: Good air movement and clear to auscultation. Cardiovascular system: S1 & S2 heard, RRR. No JVD. Gastrointestinal system: Abdomen is nondistended, soft and nontender.  Extremities: No pedal edema. Skin: No rashes, lesions or ulcers Psychiatry: Judgement and insight appear normal. Mood & affect appropriate.   Data Reviewed:    Labs: Basic Metabolic Panel: Recent Labs  Lab 02/11/22 1336 02/14/22 1458 02/15/22 0216 02/16/22 0523  NA 138 138 137 139  K 4.2 4.5 4.2 3.9  CL 108 110 111 113*  CO2 21* 18* 17* 17*  GLUCOSE 116* 109* 101* 112*  BUN 32* 27* 27* 23  CREATININE 1.61* 1.48* 1.39* 1.25*  CALCIUM 7.9* 8.2* 7.4* 7.4*  MG  --   --  1.9  --   PHOS  --   --  2.4* 2.6    GFR Estimated Creatinine Clearance: 49.4 mL/min (A) (by C-G formula based on SCr of 1.25 mg/dL (H)). Liver Function Tests: Recent Labs  Lab 02/11/22 1336 02/14/22 1458 02/15/22 0216 02/16/22 0523  AST '21 22 20 19  '$ ALT '13 16 13 13  '$ ALKPHOS 76 73 64 62  BILITOT 0.3 0.7 0.6 0.7  PROT 6.4* 6.2* 5.4* 5.2*  ALBUMIN 3.1* 2.6* 2.3* 2.2*    Recent Labs  Lab 02/14/22 1458  LIPASE 40    No results for input(s): AMMONIA in the last 168 hours. Coagulation profile No results for input(s): INR, PROTIME in the last 168 hours. COVID-19 Labs  No results for input(s): DDIMER, FERRITIN, LDH, CRP in the last 72 hours.  No results found for: SARSCOV2NAA  CBC: Recent Labs  Lab 02/11/22 1336 02/14/22 1458 02/15/22 0216  WBC 3.8* 3.3* 3.0*  NEUTROABS 3.2  --   --   HGB 10.3* 10.1* 9.5*  HCT 32.0* 31.3* 29.4*  MCV 93.0 96.0 94.8  PLT 148* 163 148*    Cardiac Enzymes: Recent Labs  Lab 02/15/22 0216  CKTOTAL 75    BNP (last 3 results) No results for input(s): PROBNP in the last 8760 hours. CBG: No results for input(s): GLUCAP in the last 168 hours. D-Dimer: No results for input(s): DDIMER in the last 72 hours. Hgb A1c: No results for input(s): HGBA1C in the last  72 hours. Lipid Profile: No results for input(s): CHOL, HDL, LDLCALC, TRIG, CHOLHDL, LDLDIRECT in the last 72 hours. Thyroid function studies: Recent Labs    02/15/22 0216  TSH 28.733*    Anemia work up: No results for input(s): VITAMINB12, FOLATE, FERRITIN, TIBC, IRON, RETICCTPCT in the last 72 hours. Sepsis Labs: Recent Labs  Lab 02/11/22 1336 02/14/22 1458 02/15/22 0216  WBC 3.8* 3.3* 3.0*    Microbiology Recent Results (from the past 240 hour(s))  Gastrointestinal Panel by PCR , Stool     Status: None   Collection Time: 02/09/22  4:26 PM   Specimen: Stool  Result Value  Ref Range Status   Campylobacter species NOT DETECTED NOT DETECTED Final   Plesimonas shigelloides NOT DETECTED NOT DETECTED Final   Salmonella species NOT DETECTED NOT DETECTED Final   Yersinia enterocolitica NOT DETECTED NOT DETECTED Final   Vibrio species NOT DETECTED NOT DETECTED Final   Vibrio cholerae NOT DETECTED NOT DETECTED Final   Enteroaggregative E coli (EAEC) NOT DETECTED NOT DETECTED Final   Enteropathogenic E coli (EPEC) NOT DETECTED NOT DETECTED Final   Enterotoxigenic E coli (ETEC) NOT DETECTED NOT DETECTED Final   Shiga like toxin producing E coli (STEC) NOT DETECTED NOT DETECTED Final   Shigella/Enteroinvasive E coli (EIEC) NOT DETECTED NOT DETECTED Final   Cryptosporidium NOT DETECTED NOT DETECTED Final   Cyclospora cayetanensis NOT DETECTED NOT DETECTED Final   Entamoeba histolytica NOT DETECTED NOT DETECTED Final   Giardia lamblia NOT DETECTED NOT DETECTED Final   Adenovirus F40/41 NOT DETECTED NOT DETECTED Final   Astrovirus NOT DETECTED NOT DETECTED Final   Norovirus GI/GII NOT DETECTED NOT DETECTED Final   Rotavirus A NOT DETECTED NOT DETECTED Final   Sapovirus (I, II, IV, and V) NOT DETECTED NOT DETECTED Final    Comment: Performed at [facility], [address] [place], [place] [address]  C difficile quick screen w PCR reflex     Status: Abnormal   Collection  Time: 02/09/22  4:31 PM   Specimen: STOOL  Result Value Ref Range Status   C Diff antigen POSITIVE (A) NEGATIVE Final   C Diff toxin POSITIVE (A) NEGATIVE Final   C Diff interpretation Toxin producing C. difficile detected.  Final    Comment: CRITICAL RESULT CALLED TO, READ BACK BY AND VERIFIED WITH: NORMAN,A. RN '@1819'$  ON 02/09/2022 BY COHEN,K Performed at [facility], [place] [address] [place], [place] [address]      Medications:    feeding supplement  1 Container Oral TID BM   levothyroxine  125 mcg Oral Q0600   multivitamin with minerals  1 tablet Oral Daily   phosphorus  250 mg Oral BID   potassium chloride SA  20 mEq Oral BID   sertraline  100 mg Oral Daily   Telotristat Ethyl(as Etiprate)  250 mg Oral TID   vancomycin  125 mg Oral QID   Continuous Infusions:  sodium bicarbonate 150 mEq in D5W infusion 75 mL/hr at 02/17/22 2774      LOS: 2 days   Charlynne Cousins  [facility]  02/17/2022, 9:14 AM

## 2022-02-17 NOTE — Progress Notes (Signed)
Oncology follow up   I stopped by twice to see Ryan Cowan, he was cleaning himself in the room, not available.  I will see him back tomorrow.  [name]  02/17/2022

## 2022-02-17 NOTE — Progress Notes (Signed)
Manufacturing engineer (ACC)  Met with PMT team and [facility] manager to continue discussing discharge options.  Current plan is to private pay for LTC and hospice at the facility, once one has been chosen.  ACC will continue to follow peripherally for discharge destination and will help actively with discharge plans once determined.  Thank you, Venia Carbon DNP, RN [facility] Liaison

## 2022-02-18 ENCOUNTER — Telehealth: Payer: Self-pay

## 2022-02-18 DIAGNOSIS — C7A029 Malignant carcinoid tumor of the large intestine, unspecified portion: Secondary | ICD-10-CM | POA: Diagnosis not present

## 2022-02-18 DIAGNOSIS — R180 Malignant ascites: Secondary | ICD-10-CM | POA: Diagnosis not present

## 2022-02-18 DIAGNOSIS — A0472 Enterocolitis due to Clostridium difficile, not specified as recurrent: Secondary | ICD-10-CM | POA: Diagnosis not present

## 2022-02-18 DIAGNOSIS — E8809 Other disorders of plasma-protein metabolism, not elsewhere classified: Secondary | ICD-10-CM | POA: Diagnosis not present

## 2022-02-18 MED ORDER — PSYLLIUM 95 % PO PACK
1.0000 | PACK | Freq: Two times a day (BID) | ORAL | Status: DC
  Administered 2022-02-18 – 2022-02-20 (×5): 1 via ORAL
  Filled 2022-02-18 (×5): qty 1

## 2022-02-18 MED ORDER — OCTREOTIDE ACETATE 50 MCG/ML IJ SOLN
200.0000 ug | Freq: Every day | INTRAMUSCULAR | Status: DC
  Administered 2022-02-18 – 2022-02-20 (×3): 200 ug via SUBCUTANEOUS
  Filled 2022-02-18 (×3): qty 4

## 2022-02-18 NOTE — Progress Notes (Signed)
Physical Therapy Treatment Patient Details Name: Ryan Cowan MRN: [id number] DOB: 03-Sep-1946 Today's Date: 02/18/2022   History of Present Illness Atari P Gunderson is a 76 y.o. 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    PT Comments    Pt sitting in recliner and agreeable to be seen, reporting some pain in the perineal region. Pt min guard for all mobility today including ambulation with RW in room, ~37f. Pt generally talkative the entire time so does distract himself during ambulation and required multiple cues to slow down and utilize RW. Pt reports generalized weakness, recent fall, and fear of falling so is not a good candidate for home without supervision and family does is not able to provide necessary level of assist. Pt and sister are pursuing SNF + hospice private pay and have a bed offer, discharge destination has been changed accordingly. We will continue to follow the pt acutely.    Recommendations for follow up therapy are one component of a multi-disciplinary discharge planning process, led by the attending physician.  Recommendations may be updated based on patient status, additional functional criteria and insurance authorization.  Follow Up Recommendations  Skilled nursing-short term rehab (<3 hours/day) (Pt and sister are pursing SNF + hospice)     Assistance Recommended at Discharge Frequent or constant Supervision/Assistance  Patient can return home with the following Assist for transportation;Assistance with cooking/housework;Help with stairs or ramp for entrance;A little help with walking and/or transfers;A little help with bathing/dressing/bathroom   Equipment Recommendations  Rolling walker (2 wheels)    Recommendations for Other Services       Precautions / Restrictions  Precautions Precautions: None Precaution Comments: C DIFF, wearing pullups Restrictions Weight Bearing Restrictions: No     Mobility  Bed Mobility Overal bed mobility: Modified Independent             General bed mobility comments: In recliner    Transfers Overall transfer level: Needs assistance Equipment used: Rolling walker (2 wheels) Transfers: Sit to/from Stand Sit to Stand: Min guard           General transfer comment: Pt min guard for safety, no physical assist required.    Ambulation/Gait Ambulation/Gait assistance: Min guard Gait Distance (Feet): 25 Feet Assistive device: Rolling walker (2 wheels) Gait Pattern/deviations: Step-through pattern, Shuffle, Drifts right/left Gait velocity: decr     General Gait Details: shuffles, does not pick up feet. Patient not relying on the RW . At times picks up the RW, drifts R/L and required cuing to slow pace. Pt reports recent history of falls and fear of falling.   Stairs             Wheelchair Mobility    Modified Rankin (Stroke Patients Only)       Balance Overall balance assessment: Needs assistance Sitting-balance support: Feet supported, No upper extremity supported Sitting balance-Leahy Scale: Good     Standing balance support: During functional activity, No upper extremity supported Standing balance-Leahy Scale: Fair Standing balance comment: Pt able to stand and perform pericare himself after a BM without UE support.                            Cognition Arousal/Alertness: Awake/alert Behavior During Therapy: WFL for tasks assessed/performed Overall Cognitive Status: Within Functional Limits for tasks assessed  General Comments: Loquatious with tangential converstion,        Exercises      General Comments        Pertinent Vitals/Pain Pain Assessment Pain Assessment: No/denies pain Faces Pain Scale: Hurts a little  bit Pain Location: gentials Pain Descriptors / Indicators: Grimacing, Tender, Burning Pain Intervention(s): Monitored during session, Repositioned    Home Living                          Prior Function            PT Goals (current goals can now be found in the care plan section) Acute Rehab PT Goals Patient Stated Goal: wants to go home PT Goal Formulation: With patient/family Time For Goal Achievement: 03/01/22 Potential to Achieve Goals: Good Progress towards PT goals: Progressing toward goals    Frequency    Min 2X/week      PT Plan Discharge plan needs to be updated    Co-evaluation              AM-PAC PT "6 Clicks" Mobility   Outcome Measure  Help needed turning from your back to your side while in a flat bed without using bedrails?: None Help needed moving from lying on your back to sitting on the side of a flat bed without using bedrails?: None Help needed moving to and from a bed to a chair (including a wheelchair)?: A Little Help needed standing up from a chair using your arms (e.g., wheelchair or bedside chair)?: A Little Help needed to walk in hospital room?: A Little Help needed climbing 3-5 steps with a railing? : A Lot 6 Click Score: 19    End of Session Equipment Utilized During Treatment: Gait belt Activity Tolerance: Patient tolerated treatment well Patient left: in chair;with call bell/phone within reach;with chair alarm set Nurse Communication: Mobility status PT Visit Diagnosis: Unsteadiness on feet (R26.81);Difficulty in walking, not elsewhere classified (R26.2)     Time: 5176-1607 PT Time Calculation (min) (ACUTE ONLY): 34 min  Charges:  $Gait Training: 8-22 mins $Self Care/Home Management: 8-22                     Coolidge Breeze, PT, DPT [facility] [facility] Department Office: [phone] Pager: [phone]   Coolidge Breeze 02/18/2022, 1:33 PM

## 2022-02-18 NOTE — Telephone Encounter (Signed)
Spoke with Ronny Bacon in [place] to cancel pt's appt scheduled on 03/10/2022 for [facility].  Pt will be d/c to SNF then to Hospice.

## 2022-02-18 NOTE — Progress Notes (Signed)
[facility] PROGRESS NOTE    Progress Note  Ryan Cowan  BMW:[id number] DOB: 17-Dec-1945 DOA: 02/14/2022 PCP: Ryan Downing, MD     Brief Narrative:   Ryan Cowan is an 76 y.o. 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 difficile, which is missed several doses of his medication comes in with watery stool more than 10 bowel movements in the last 24 hours on admission.  Assessment/Plan:   C. difficile colitis diarrhea: Ryan Cowan continues to have multiple bowel movements with at 3 bowel movements since midnight. We will start him on Metamucil. Continue oral Dificid Physical therapy evaluated the patient recommended rehab, family wants long-term skilled nursing facility with hospice.  Normal anion gap metabolic acidosis: Likely due to watery stools. KVO IV fluids, acidosis has resolved. Basic metabolic panels pending this morning.  Hypothyroidism: TSH is 28 likely due to noncompliance currently on Synthroid 125 recheck TSH in 6 to 8 weeks.  Acute kidney injury on chronic kidney disease stage IIIa: Likely pre-renal azotemia in the setting of diarrhea. Resolved with fluid resuscitation.  Iron deficiency anemia due to chronic blood loss: Hemoglobin is stable follow-up with PCP as an outpatient.  Metastatic neuroendocrine tumor: Palliative Care discussed with family, will need to follow-up with oncology as an outpatient.  Severe protein caloric malnutrition: Nutrition has been consulted Ensure 3 times daily.  Hypophosphatemia: Likely due to diarrhea, replete orally now resolved.  Goals of care: Palliative care to meet with family discuss end-of-life.  Severe protein caloric malnutrition: Likely due to malignancy.   DVT prophylaxis: lovenox Family Communication:none Status is: Inpatient Remains inpatient appropriate because: C.  difficile colitis    Code Status:     Code Status Orders  (From admission, onward)           Start     Ordered   02/14/22 2213  Do not attempt resuscitation (DNR)  Continuous       Question Answer Comment  In the event of cardiac or respiratory ARREST Do not call a "code blue"   In the event of cardiac or respiratory ARREST Do not perform Intubation, CPR, defibrillation or ACLS   In the event of cardiac or respiratory ARREST Use medication by any route, position, wound care, and other measures to relive pain and suffering. May use oxygen, suction and manual treatment of airway obstruction as needed for comfort.      02/14/22 2212           Code Status History     Date Active Date Inactive Code Status Order ID Comments User Context   02/18/2015 1344 02/19/2015 1707 Full Code [id number]  Ryan Mariscal, MD Inpatient   02/16/2015 1529 02/18/2015 1344 Full Code [id number]  Ryan Parr, NP Inpatient      Advance Directive Documentation    Flowsheet Row Most Recent Value  Type of Advance Directive Healthcare Power of Attorney, Living will  Pre-existing out of facility DNR order (yellow form or pink MOST form) --  "MOST" Form in Place? --         IV Access:   Peripheral IV   Procedures and diagnostic studies:   No results found.   Medical Consultants:   None.   Subjective:    Ryan Cowan relates that he has 3 bowel movement since 3 AM this morning  Objective:    Vitals:   02/17/22 0630 02/17/22 1354  02/17/22 1941 02/18/22 0334  BP: 98/61 93/63 (!) 107/52 (!) 94/52  Pulse: 61 66 74 72  Resp: '20 18 15 15  '$ Temp: (!) 97.3 F (36.3 C) (!) 97.5 F (36.4 C) 97.6 F (36.4 C) 97.8 F (36.6 C)  TempSrc: Oral Oral Oral Oral  SpO2: 100% 100% 100% 99%  Weight:      Height:       SpO2: 99 %   Intake/Output Summary (Last 24 hours) at 02/18/2022 0946 Last data filed at 02/17/2022 1900 Gross per 24 hour  Intake 600 ml  Output --  Net 600 ml     Filed Weights   02/14/22 1444  Weight: 69.4 kg    Exam: General exam: In no acute distress, cachectic Respiratory system: Good air movement and clear to auscultation. Cardiovascular system: S1 & S2 heard, RRR. No JVD. Gastrointestinal system: Abdomen is nondistended, soft and nontender.  Extremities: No pedal edema. Skin: No rashes, lesions or ulcers Psychiatry: Judgement and insight appear normal. Mood & affect appropriate.   Data Reviewed:    Labs: Basic Metabolic Panel: Recent Labs  Lab 02/11/22 1336 02/14/22 1458 02/15/22 0216 02/16/22 0523 02/17/22 0954  NA 138 138 137 139 136  K 4.2 4.5 4.2 3.9 3.6  CL 108 110 111 113* 109  CO2 21* 18* 17* 17* 21*  GLUCOSE 116* 109* 101* 112* 122*  BUN 32* 27* 27* 23 18  CREATININE 1.61* 1.48* 1.39* 1.25* 1.13  CALCIUM 7.9* 8.2* 7.4* 7.4* 7.0*  MG  --   --  1.9  --   --   PHOS  --   --  2.4* 2.6  --     GFR Estimated Creatinine Clearance: 54.6 mL/min (by C-G formula based on SCr of 1.13 mg/dL). Liver Function Tests: Recent Labs  Lab 02/11/22 1336 02/14/22 1458 02/15/22 0216 02/16/22 0523  AST '21 22 20 19  '$ ALT '13 16 13 13  '$ ALKPHOS 76 73 64 62  BILITOT 0.3 0.7 0.6 0.7  PROT 6.4* 6.2* 5.4* 5.2*  ALBUMIN 3.1* 2.6* 2.3* 2.2*    Recent Labs  Lab 02/14/22 1458  LIPASE 40    No results for input(s): AMMONIA in the last 168 hours. Coagulation profile No results for input(s): INR, PROTIME in the last 168 hours. COVID-19 Labs  No results for input(s): DDIMER, FERRITIN, LDH, CRP in the last 72 hours.  No results found for: SARSCOV2NAA  CBC: Recent Labs  Lab 02/11/22 1336 02/14/22 1458 02/15/22 0216  WBC 3.8* 3.3* 3.0*  NEUTROABS 3.2  --   --   HGB 10.3* 10.1* 9.5*  HCT 32.0* 31.3* 29.4*  MCV 93.0 96.0 94.8  PLT 148* 163 148*    Cardiac Enzymes: Recent Labs  Lab 02/15/22 0216  CKTOTAL 75    BNP (last 3 results) No results for input(s): PROBNP in the last 8760 hours. CBG: Recent Labs  Lab  02/17/22 2155  GLUCAP 123*   D-Dimer: No results for input(s): DDIMER in the last 72 hours. Hgb A1c: No results for input(s): HGBA1C in the last 72 hours. Lipid Profile: No results for input(s): CHOL, HDL, LDLCALC, TRIG, CHOLHDL, LDLDIRECT in the last 72 hours. Thyroid function studies: No results for input(s): TSH, T4TOTAL, T3FREE, THYROIDAB in the last 72 hours.  Invalid input(s): FREET3  Anemia work up: No results for input(s): VITAMINB12, FOLATE, FERRITIN, TIBC, IRON, RETICCTPCT in the last 72 hours. Sepsis Labs: Recent Labs  Lab 02/11/22 1336 02/14/22 1458 02/15/22 0216  WBC 3.8* 3.3* 3.0*  Microbiology Recent Results (from the past 240 hour(s))  Gastrointestinal Panel by PCR , Stool     Status: None   Collection Time: 02/09/22  4:26 PM   Specimen: Stool  Result Value Ref Range Status   Campylobacter species NOT DETECTED NOT DETECTED Final   Plesimonas shigelloides NOT DETECTED NOT DETECTED Final   Salmonella species NOT DETECTED NOT DETECTED Final   Yersinia enterocolitica NOT DETECTED NOT DETECTED Final   Vibrio species NOT DETECTED NOT DETECTED Final   Vibrio cholerae NOT DETECTED NOT DETECTED Final   Enteroaggregative E coli (EAEC) NOT DETECTED NOT DETECTED Final   Enteropathogenic E coli (EPEC) NOT DETECTED NOT DETECTED Final   Enterotoxigenic E coli (ETEC) NOT DETECTED NOT DETECTED Final   Shiga like toxin producing E coli (STEC) NOT DETECTED NOT DETECTED Final   Shigella/Enteroinvasive E coli (EIEC) NOT DETECTED NOT DETECTED Final   Cryptosporidium NOT DETECTED NOT DETECTED Final   Cyclospora cayetanensis NOT DETECTED NOT DETECTED Final   Entamoeba histolytica NOT DETECTED NOT DETECTED Final   Giardia lamblia NOT DETECTED NOT DETECTED Final   Adenovirus F40/41 NOT DETECTED NOT DETECTED Final   Astrovirus NOT DETECTED NOT DETECTED Final   Norovirus GI/GII NOT DETECTED NOT DETECTED Final   Rotavirus A NOT DETECTED NOT DETECTED Final   Sapovirus (I, II,  IV, and V) NOT DETECTED NOT DETECTED Final    Comment: Performed at [facility], [address] [place], [place] [address]  C difficile quick screen w PCR reflex     Status: Abnormal   Collection Time: 02/09/22  4:31 PM   Specimen: STOOL  Result Value Ref Range Status   C Diff antigen POSITIVE (A) NEGATIVE Final   C Diff toxin POSITIVE (A) NEGATIVE Final   C Diff interpretation Toxin producing C. difficile detected.  Final    Comment: CRITICAL RESULT CALLED TO, READ BACK BY AND VERIFIED WITH: NORMAN,A. RN '@1819'$  ON 02/09/2022 BY COHEN,K Performed at [facility], [place] [address], [place], [place] [address]      Medications:    feeding supplement  1 Container Oral TID BM   fidaxomicin  200 mg Oral BID   levothyroxine  125 mcg Oral Q0600   multivitamin with minerals  1 tablet Oral Daily   potassium chloride SA  20 mEq Oral BID   sertraline  100 mg Oral Daily   Telotristat Ethyl(as Etiprate)  250 mg Oral TID   Continuous Infusions:      LOS: 3 days   Charlynne Cousins  [facility]  02/18/2022, 9:46 AM

## 2022-02-18 NOTE — TOC Progression Note (Addendum)
Transition of Care [facility]) - Progression Note    Patient Details  Name: Ryan Cowan MRN: [id number] Date of Birth: 11-15-1945  Transition of Care [facility]) CM/SW [place], [place] Phone Number: 02/18/2022, 12:01 PM  Clinical Narrative:     Met with sister Ryan Cowan outside of room as pt using bathroom. Provided SNF offers. Explained that pt is medically ready and if not agreeable to one of offers family would need to make private care arrangements for home. Ryan Cowan will discuss with pt and follow up.  1130: Ryan Cowan notified CSW that she spoke with pt and pt is adamant about not wanting to go home. They agree on [facility]. CSW informed Ryan Cowan that she would need to go to [facility] and meet with business Glass blower/designer, Olin, to further discuss financials. Ryan Cowan is agreeable and will go there now.   1330: Financials complete with [facility] and they can admit pt. CSW updated pt sister that Dificid will need to be brought from pt home to facility. Plan to DC tomorrow to SNF with hospice.  [facility] updated.  Expected Discharge Plan: [place] Barriers to Discharge: Continued Medical Work up, Ship broker, SNF Pending bed offer  Expected Discharge Plan and Services Expected Discharge Plan: [place] arrangements for the past 2 months: Single Family Home                                       Social Determinants of Health (SDOH) Interventions    Readmission Risk Interventions     View : No data to display.

## 2022-02-19 DIAGNOSIS — A0472 Enterocolitis due to Clostridium difficile, not specified as recurrent: Secondary | ICD-10-CM | POA: Diagnosis not present

## 2022-02-19 DIAGNOSIS — N1832 Chronic kidney disease, stage 3b: Secondary | ICD-10-CM | POA: Diagnosis not present

## 2022-02-19 NOTE — TOC Progression Note (Signed)
Transition of Care [facility]) - Progression Note    Patient Details  Name: Ryan Cowan MRN: [id number] Date of Birth: Nov 10, 1945  Transition of Care [facility]) CM/SW Contact  Ross Ludwig, [place] Phone Number: 02/19/2022, 12:43 PM  Clinical Narrative:     CSW spoke to [place] at [facility] and they said they can accept patient tomorrow if he is medically ready for discharge.  CSW updated attending physician and bedside nurse.  TOC to continue to follow patient's progress throughout discharge planning.  Expected Discharge Plan: [place] Barriers to Discharge: Continued Medical Work up, Ship broker, SNF Pending bed offer  Expected Discharge Plan and Services Expected Discharge Plan: [place] arrangements for the past 2 months: Single Family Home                                       Social Determinants of Health (SDOH) Interventions    Readmission Risk Interventions     View : No data to display.

## 2022-02-19 NOTE — Progress Notes (Signed)
[facility] PROGRESS NOTE    Progress Note  Ryan Cowan  KDT:[id number] DOB: 1946/03/08 DOA: 02/14/2022 PCP: Leonard Downing, MD     Brief Narrative:   Ryan Cowan is an 76 y.o. 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 difficile, which is missed several doses of his medication comes in with watery stool more than 10 bowel movements in the last 24 hours on admission.  Assessment/Plan:   C. difficile colitis diarrhea: Continues to have watery bowel movements they were formed 2 days prior now they are watery again continue Dificid continue Metamucil. Physical therapy evaluated the patient recommended rehab, family wants long-term skilled nursing facility with hospice.  Normal anion gap metabolic acidosis: Likely due to watery stools. KVO IV fluids, acidosis has resolved.  Hypothyroidism: TSH is 28 likely due to noncompliance currently on Synthroid 125 recheck TSH in 6 to 8 weeks.  Acute kidney injury on chronic kidney disease stage IIIa: Likely pre-renal azotemia in the setting of diarrhea. Resolved with fluid resuscitation.  Iron deficiency anemia due to chronic blood loss: Hemoglobin is stable follow-up with PCP as an outpatient.  Metastatic neuroendocrine tumor: Palliative Care discussed with family, will need to follow-up with oncology as an outpatient.  Severe protein caloric malnutrition: Nutrition has been consulted Ensure 3 times daily.  Hypophosphatemia: Likely due to diarrhea, replete orally now resolved.  Goals of care: Palliative care to meet with family discuss end-of-life.  Severe protein caloric malnutrition: Likely due to malignancy.   DVT prophylaxis: lovenox Family Communication:none Status is: Inpatient Remains inpatient appropriate because: C. difficile colitis    Code Status:     Code Status Orders   (From admission, onward)           Start     Ordered   02/14/22 2213  Do not attempt resuscitation (DNR)  Continuous       Question Answer Comment  In the event of cardiac or respiratory ARREST Do not call a "code blue"   In the event of cardiac or respiratory ARREST Do not perform Intubation, CPR, defibrillation or ACLS   In the event of cardiac or respiratory ARREST Use medication by any route, position, wound care, and other measures to relive pain and suffering. May use oxygen, suction and manual treatment of airway obstruction as needed for comfort.      02/14/22 2212           Code Status History     Date Active Date Inactive Code Status Order ID Comments User Context   02/18/2015 1344 02/19/2015 1707 Full Code [id number]  Sandi Mariscal, MD Inpatient   02/16/2015 1529 02/18/2015 1344 Full Code [id number]  Samella Parr, NP Inpatient      Advance Directive Documentation    Flowsheet Row Most Recent Value  Type of Advance Directive Healthcare Power of Attorney, Living will  Pre-existing out of facility DNR order (yellow form or pink MOST form) --  "MOST" Form in Place? --         IV Access:   Peripheral IV   Procedures and diagnostic studies:   No results found.   Medical Consultants:   None.   Subjective:    Ryan Cowan relates his stools are watery feels tired and fatigued anorexic.  Objective:    Vitals:   02/18/22 0334 02/18/22 1234 02/18/22 2029 02/19/22 0649  BP: (!) 94/52 104/60 Marland Kitchen)  103/58 111/62  Pulse: 72 83 77 77  Resp: '15 18 18 18  '$ Temp: 97.8 F (36.6 C) 97.7 F (36.5 C) 97.6 F (36.4 C) (!) 97.3 F (36.3 C)  TempSrc: Oral Oral Oral Oral  SpO2: 99% 100% 100% 100%  Weight:      Height:       SpO2: 100 %   Intake/Output Summary (Last 24 hours) at 02/19/2022 0902 Last data filed at 02/18/2022 1700 Gross per 24 hour  Intake 360 ml  Output --  Net 360 ml    Filed Weights   02/14/22 1444  Weight: 69.4 kg     Exam: General exam: In no acute distress, cachectic Respiratory system: Good air movement and clear to auscultation. Cardiovascular system: S1 & S2 heard, RRR. No JVD. Gastrointestinal system: Abdomen is nondistended, soft and nontender.  Extremities: No pedal edema. Skin: No rashes, lesions or ulcers  Data Reviewed:    Labs: Basic Metabolic Panel: Recent Labs  Lab 02/14/22 1458 02/15/22 0216 02/16/22 0523 02/17/22 0954  NA 138 137 139 136  K 4.5 4.2 3.9 3.6  CL 110 111 113* 109  CO2 18* 17* 17* 21*  GLUCOSE 109* 101* 112* 122*  BUN 27* 27* 23 18  CREATININE 1.48* 1.39* 1.25* 1.13  CALCIUM 8.2* 7.4* 7.4* 7.0*  MG  --  1.9  --   --   PHOS  --  2.4* 2.6  --     GFR Estimated Creatinine Clearance: 54.6 mL/min (by C-G formula based on SCr of 1.13 mg/dL). Liver Function Tests: Recent Labs  Lab 02/14/22 1458 02/15/22 0216 02/16/22 0523  AST '22 20 19  '$ ALT '16 13 13  '$ ALKPHOS 73 64 62  BILITOT 0.7 0.6 0.7  PROT 6.2* 5.4* 5.2*  ALBUMIN 2.6* 2.3* 2.2*    Recent Labs  Lab 02/14/22 1458  LIPASE 40    No results for input(s): AMMONIA in the last 168 hours. Coagulation profile No results for input(s): INR, PROTIME in the last 168 hours. COVID-19 Labs  No results for input(s): DDIMER, FERRITIN, LDH, CRP in the last 72 hours.  No results found for: SARSCOV2NAA  CBC: Recent Labs  Lab 02/14/22 1458 02/15/22 0216  WBC 3.3* 3.0*  HGB 10.1* 9.5*  HCT 31.3* 29.4*  MCV 96.0 94.8  PLT 163 148*    Cardiac Enzymes: Recent Labs  Lab 02/15/22 0216  CKTOTAL 75    BNP (last 3 results) No results for input(s): PROBNP in the last 8760 hours. CBG: Recent Labs  Lab 02/17/22 2155  GLUCAP 123*    D-Dimer: No results for input(s): DDIMER in the last 72 hours. Hgb A1c: No results for input(s): HGBA1C in the last 72 hours. Lipid Profile: No results for input(s): CHOL, HDL, LDLCALC, TRIG, CHOLHDL, LDLDIRECT in the last 72 hours. Thyroid function  studies: No results for input(s): TSH, T4TOTAL, T3FREE, THYROIDAB in the last 72 hours.  Invalid input(s): FREET3  Anemia work up: No results for input(s): VITAMINB12, FOLATE, FERRITIN, TIBC, IRON, RETICCTPCT in the last 72 hours. Sepsis Labs: Recent Labs  Lab 02/14/22 1458 02/15/22 0216  WBC 3.3* 3.0*    Microbiology Recent Results (from the past 240 hour(s))  Gastrointestinal Panel by PCR , Stool     Status: None   Collection Time: 02/09/22  4:26 PM   Specimen: Stool  Result Value Ref Range Status   Campylobacter species NOT DETECTED NOT DETECTED Final   Plesimonas shigelloides NOT DETECTED NOT DETECTED Final   Salmonella species  NOT DETECTED NOT DETECTED Final   Yersinia enterocolitica NOT DETECTED NOT DETECTED Final   Vibrio species NOT DETECTED NOT DETECTED Final   Vibrio cholerae NOT DETECTED NOT DETECTED Final   Enteroaggregative E coli (EAEC) NOT DETECTED NOT DETECTED Final   Enteropathogenic E coli (EPEC) NOT DETECTED NOT DETECTED Final   Enterotoxigenic E coli (ETEC) NOT DETECTED NOT DETECTED Final   Shiga like toxin producing E coli (STEC) NOT DETECTED NOT DETECTED Final   Shigella/Enteroinvasive E coli (EIEC) NOT DETECTED NOT DETECTED Final   Cryptosporidium NOT DETECTED NOT DETECTED Final   Cyclospora cayetanensis NOT DETECTED NOT DETECTED Final   Entamoeba histolytica NOT DETECTED NOT DETECTED Final   Giardia lamblia NOT DETECTED NOT DETECTED Final   Adenovirus F40/41 NOT DETECTED NOT DETECTED Final   Astrovirus NOT DETECTED NOT DETECTED Final   Norovirus GI/GII NOT DETECTED NOT DETECTED Final   Rotavirus A NOT DETECTED NOT DETECTED Final   Sapovirus (I, II, IV, and V) NOT DETECTED NOT DETECTED Final    Comment: Performed at [facility], [address] [place], [place] [address]  C difficile quick screen w PCR reflex     Status: Abnormal   Collection Time: 02/09/22  4:31 PM   Specimen: STOOL  Result Value Ref Range Status   C Diff antigen  POSITIVE (A) NEGATIVE Final   C Diff toxin POSITIVE (A) NEGATIVE Final   C Diff interpretation Toxin producing C. difficile detected.  Final    Comment: CRITICAL RESULT CALLED TO, READ BACK BY AND VERIFIED WITH: NORMAN,A. RN '@[id number]'$  ON 02/09/2022 BY COHEN,K Performed at [facility], [place] [address], [place],  [address]      Medications:    feeding supplement  1 Container Oral TID BM   fidaxomicin  200 mg Oral BID   levothyroxine  125 mcg Oral Q0600   multivitamin with minerals  1 tablet Oral Daily   octreotide  200 mcg Subcutaneous Daily   potassium chloride SA  20 mEq Oral BID   psyllium  1 packet Oral BID   sertraline  100 mg Oral Daily   Telotristat Ethyl(as Etiprate)  250 mg Oral TID   Continuous Infusions:      LOS: 4 days   Charlynne Cousins  [facility]  02/19/2022, 9:02 AM

## 2022-02-19 NOTE — Plan of Care (Signed)

## 2022-02-20 DIAGNOSIS — C7A029 Malignant carcinoid tumor of the large intestine, unspecified portion: Secondary | ICD-10-CM | POA: Diagnosis not present

## 2022-02-20 DIAGNOSIS — A0472 Enterocolitis due to Clostridium difficile, not specified as recurrent: Secondary | ICD-10-CM | POA: Diagnosis not present

## 2022-02-20 DIAGNOSIS — E8809 Other disorders of plasma-protein metabolism, not elsewhere classified: Secondary | ICD-10-CM | POA: Diagnosis not present

## 2022-02-20 DIAGNOSIS — N1832 Chronic kidney disease, stage 3b: Secondary | ICD-10-CM | POA: Diagnosis not present

## 2022-02-20 MED ORDER — PSYLLIUM 95 % PO PACK: 1.0000 | PACK | Freq: Two times a day (BID) | ORAL | Status: AC

## 2022-02-20 MED ORDER — FIDAXOMICIN 200 MG PO TABS: 200.0000 mg | ORAL_TABLET | Freq: Two times a day (BID) | ORAL | Status: AC

## 2022-02-20 MED ORDER — FUROSEMIDE 80 MG PO TABS: 40.0000 mg | ORAL_TABLET | Freq: Every morning | ORAL | Status: AC

## 2022-02-20 NOTE — Progress Notes (Signed)
Report called to [facility].  Report given to Morene Antu RN.  Made RN aware that patient has his Fidaxomixin (Dificid) with him in the patient belonging bag.  Patient is aware.

## 2022-02-20 NOTE — Progress Notes (Signed)
Manufacturing engineer [facility])  Noted that pt will d/c today to [facility] with [facility] hospice services.  We will meet him in his facility and enroll him in hospice once he is discharged.  Venia Carbon DNP, RN [facility] Liaison

## 2022-02-20 NOTE — Discharge Summary (Signed)
Physician Discharge Summary  Ryan Cowan GBE:[id number] DOB: [birth year] DOA: 02/14/2022  PCP: Leonard Downing, MD  Admit date: 02/14/2022 Discharge date: 02/20/2022  Admitted From: Home Disposition:  SNF with hospice  Recommendations for Outpatient Follow-up:  Follow up with PCP in 1-2 weeks Please obtain BMP/CBC in one week Hospice to follow-up at facility.  Home Health:No Equipment/Devices:None  Discharge Condition:Stable CODE STATUS:Full Diet recommendation: Heart Healthy avoid dairy.  Brief/Interim Summary: 76 y.o. 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 difficile, which is missed several doses of his medication comes in with watery stool more than 10 bowel movements in the last 24 hours on admission.  Discharge Diagnoses:  Principal Problem:   Clostridioides difficile diarrhea Active Problems:   Symptomatic anemia   Carcinoid tumor of colon, malignant (HCC)   Weight loss, non-intentional   Hypoalbuminemia due to protein-calorie malnutrition (HCC)   Iron deficiency anemia due to chronic blood loss   Hypothyroidism   CKD (chronic kidney disease), stage III (HCC)   C. difficile colitis  C. difficile colitis diarrhea: He came in having more than 10 bowel movements a day. He was started on vancomycin with slow improvement transition to oral Dificid and Metamucil. Which bulk up his stools and his bowel movement frequency improved, his stools were more formed. Physical therapy evaluated the patient, he will go to skilled nursing facility family wants hospice at skilled.  Normal anion gap metabolic acidosis: Likely due to watery stool.  Resolved with fluid resuscitation.  Hypothyroidism: Likely noncompliant with his medication TSH was 28 he was resumed on his Synthroid.  Acute kidney injury on chronic kidney disease stage IIIa: Likely.   Azotemia in the setting of diarrhea resolved with fluid resuscitation.  Iron deficiency anemia due to chronic blood loss: Follow-up with PCP as an outpatient.  Metastatic neuroendocrine tumor. Palliative care met with family they would like to go to facility with hospice.  Severe protein caloric malnutrition: Noted.  Hypophosphatemia: Likely due to diarrhea replete orally now resolved.  Goals of care: Plan of care met with family they would like him to go to facility with hospice.  Severe protein caloric malnutrition likely due to malignancy.   Discharge Instructions  Discharge Instructions     Diet - low sodium heart healthy   Complete by: As directed    Increase activity slowly   Complete by: As directed       Allergies as of 02/20/2022       Reactions   Codeine Other (See Comments)   hallucinations   Everolimus Diarrhea        Medication List     STOP taking these medications    CALCIUM CARBONATE-VITAMIN D PO   vancomycin 125 MG capsule Commonly known as: VANCOCIN   VITAMIN B-12 PO   VITAMIN D3 PO       TAKE these medications    Advil 200 MG tablet Generic drug: ibuprofen Take 200-400 mg by mouth daily as needed (pain).   bismuth subsalicylate 758 IT/25QD suspension Commonly known as: PEPTO BISMOL Take 30 mLs by mouth daily as needed (acid reflux).   diphenoxylate-atropine 2.5-0.025 MG tablet Commonly known as: LOMOTIL Take 1 tablet by mouth 4 (four) times daily as needed for diarrhea or loose stools.   everolimus 10 MG tablet Commonly known as: AFINITOR Take 1 tablet (10 mg total) by mouth daily.   fidaxomicin 200 MG Tabs tablet  Commonly known as: DIFICID Take 1 tablet (200 mg total) by mouth 2 (two) times daily for 8 days.   furosemide 80 MG tablet Commonly known as: LASIX Take 0.5 tablets (40 mg total) by mouth every morning. What changed:  how much to take Another medication with the same name was removed. Continue taking this  medication, and follow the directions you see here.   levothyroxine 100 MCG tablet Commonly known as: SYNTHROID Take 100 mcg by mouth daily before breakfast.   loperamide 2 MG tablet Commonly known as: IMODIUM A-D Take 2 mg by mouth 4 (four) times daily as needed for diarrhea or loose stools.   multivitamin with minerals Tabs tablet Take 1 tablet by mouth daily with breakfast. Centrum Silver 50 plus   ondansetron 8 MG tablet Commonly known as: ZOFRAN Take 1 tablet (8 mg total) by mouth 2 (two) times daily as needed for nausea or vomiting.   potassium chloride SA 20 MEQ tablet Commonly known as: KLOR-CON M Take 1 tablet (20 mEq total) by mouth 2 (two) times daily. What changed:  how much to take when to take this   psyllium 95 % Pack Commonly known as: HYDROCIL/METAMUCIL Take 1 packet by mouth 2 (two) times daily.   sertraline 100 MG tablet Commonly known as: ZOLOFT Take 100 mg by mouth at bedtime.   spironolactone 100 MG tablet Commonly known as: ALDACTONE Take 100 mg by mouth every morning.   Xermelo 250 MG Tabs Generic drug: Telotristat Ethyl(as Etiprate) Take 250 mg by mouth in the morning, at noon, and at bedtime.        Allergies  Allergen Reactions   Codeine Other (See Comments)    hallucinations   Everolimus Diarrhea    Consultations: None   Procedures/Studies: CT ABDOMEN PELVIS W CONTRAST  Result Date: 02/14/2022 CLINICAL DATA:  Diarrhea, nausea, vomiting, history of colon carcinoma EXAM: CT ABDOMEN AND PELVIS WITH CONTRAST TECHNIQUE: Multidetector CT imaging of the abdomen and pelvis was performed using the standard protocol following bolus administration of intravenous contrast. RADIATION DOSE REDUCTION: This exam was performed according to the departmental dose-optimization program which includes automated exposure control, adjustment of the mA and/or kV according to patient size and/or use of iterative reconstruction technique. CONTRAST:  74m  OMNIPAQUE IOHEXOL 300 MG/ML  SOLN COMPARISON:  06/08/2021 FINDINGS: Lower chest: No significant abnormality is seen in the visualized lower lung fields. Hepatobiliary: There are multiple hypervascular lesions in the liver largest measuring 2.3 cm in size. There is interval appearance of new enhancing lesion with central low attenuation in the inferior aspect of right lobe of liver. There is 12 mm low-density lesion in the left lobe, possibly a cyst or hemangioma. There is mild nodularity in the liver surface. There is no dilation of bile ducts. Gallbladder is unremarkable. Pancreas: No focal abnormality is seen. Spleen: Spleen is smaller than usual in size. Adrenals/Urinary Tract: Adrenals are unremarkable. There is no hydronephrosis. Is 3.5 cm smooth marginated fluid density lesion in the lower pole of left kidney. There are no renal or ureteral stones. Urinary bladder is unremarkable. Stomach/Bowel: Small hiatal hernia is seen. Stomach is not distended. There is mild diffuse wall thickening in the distal small bowel loops. This may be related to large ascites. Appendix is not distinctly seen. There is no significant wall thickening in the colon. Vascular/Lymphatic: Scattered atherosclerotic plaques and calcifications are seen in the aorta and its major branches. There is 4.8 x 3.2 cm soft tissue mass in the mesentery  in the right lower quadrant of abdomen which has not changed significantly. There is stranding in the mesentery, possibly due to prominent vasa recta. Reproductive: There is inhomogeneous attenuation in the prostate. There is ectasia of vessels in the left spermatic cord, possibly suggesting varicocele. Other: There is marked interval increase in ascites. Musculoskeletal: Degenerative changes are noted in the lumbar spine. IMPRESSION: There is massive ascites which may suggest peritoneal metastatic disease or cirrhosis. There is increase in number of enhancing lesions in the liver suggesting  progression of hepatic metastatic disease. There is 4.8 x 3.2 cm soft tissue mass in the mesentery in the right lower abdomen suggesting possible metastatic disease with no significant interval change. There is no evidence of intestinal obstruction or pneumoperitoneum. There is no hydronephrosis. Possible varicocele on the left side. Left renal cyst. Small hiatal hernia. Other findings as described in the body of the report. Electronically Signed   By: Elmer Picker M.D.   On: 02/14/2022 18:04   DG CHEST PORT 1 VIEW  Result Date: 02/14/2022 CLINICAL DATA:  Dyspnea EXAM: PORTABLE CHEST 1 VIEW COMPARISON:  12/20/2021 FINDINGS: The heart size and mediastinal contours are within normal limits. Both lungs are clear. The visualized skeletal structures are unremarkable. IMPRESSION: No active disease. Electronically Signed   By: Randa Ngo M.D.   On: 02/14/2022 20:52     Subjective: No complaints  Discharge Exam: Vitals:   02/19/22 2005 02/20/22 0631  BP: 120/64 103/71  Pulse: 75 81  Resp: 16 18  Temp: 97.9 F (36.6 C) (!) 97.5 F (36.4 C)  SpO2: 100% 100%   Vitals:   02/19/22 0649 02/19/22 1516 02/19/22 2005 02/20/22 0631  BP: 111/62 130/77 120/64 103/71  Pulse: 77 79 75 81  Resp: _0 Temp: (!) 97.3 F (36.3 C) 97.9 F (36.6 C) 97.9 F (36.6 C) (!) 97.5 F (36.4 C)  TempSrc: Oral Oral Oral Oral  SpO2: 100% 100% 100% 100%  Weight:      Height:        General: Pt is alert, awake, not in acute distress Cardiovascular: RRR, S1/S2 +, no rubs, no gallops Respiratory: CTA bilaterally, no wheezing, no rhonchi Abdominal: Soft, NT, ND, bowel sounds + Extremities: no edema, no cyanosis    The results of significant diagnostics from this hospitalization (including imaging, microbiology, ancillary and laboratory) are listed below for reference.     Microbiology: No results found for this or any previous visit (from the past 240 hour(s)).   Labs: BNP (last 3  results) No results for input(s): BNP in the last 8760 hours. Basic Metabolic Panel: Recent Labs  Lab 02/14/22 1458 02/15/22 0216 02/16/22 0523 02/17/22 0954  NA 138 137 139 136  K 4.5 4.2 3.9 3.6  CL 110 111 113* 109  CO2 18* 17* 17* 21*  GLUCOSE 109* 101* 112* 122*  BUN 27* 27* 23 18  CREATININE 1.48* 1.39* 1.25* 1.13  CALCIUM 8.2* 7.4* 7.4* 7.0*  MG  --  1.9  --   --   PHOS  --  2.4* 2.6  --    Liver Function Tests: Recent Labs  Lab 02/14/22 1458 02/15/22 0216 02/16/22 0523  AST _1 ALT _2 ALKPHOS 73 64 62  BILITOT 0.7 0.6 0.7  PROT 6.2* 5.4* 5.2*  ALBUMIN 2.6* 2.3* 2.2*   Recent Labs  Lab 02/14/22 1458  LIPASE 40   No results for input(s): AMMONIA in the last 168 hours.  CBC: Recent Labs  Lab 02/14/22 1458 02/15/22 0216  WBC 3.3* 3.0*  HGB 10.1* 9.5*  HCT 31.3* 29.4*  MCV 96.0 94.8  PLT 163 148*   Cardiac Enzymes: Recent Labs  Lab 02/15/22 0216  CKTOTAL 75   BNP: Invalid input(s): POCBNP CBG: Recent Labs  Lab 02/17/22 2155  GLUCAP 123*   D-Dimer No results for input(s): DDIMER in the last 72 hours. Hgb A1c No results for input(s): HGBA1C in the last 72 hours. Lipid Profile No results for input(s): CHOL, HDL, LDLCALC, TRIG, CHOLHDL, LDLDIRECT in the last 72 hours. Thyroid function studies No results for input(s): TSH, T4TOTAL, T3FREE, THYROIDAB in the last 72 hours.  Invalid input(s): FREET3 Anemia work up No results for input(s): VITAMINB12, FOLATE, FERRITIN, TIBC, IRON, RETICCTPCT in the last 72 hours. Urinalysis    Component Value Date/Time   COLORURINE YELLOW 02/17/2022 0032   APPEARANCEUR CLEAR 02/17/2022 0032   LABSPEC 1.026 02/17/2022 0032   PHURINE 6.0 02/17/2022 0032   GLUCOSEU NEGATIVE 02/17/2022 0032   HGBUR NEGATIVE 02/17/2022 0032   BILIRUBINUR NEGATIVE 02/17/2022 0032   KETONESUR NEGATIVE 02/17/2022 0032   PROTEINUR 30 (A) 02/17/2022 0032   NITRITE NEGATIVE 02/17/2022 0032   LEUKOCYTESUR NEGATIVE  02/17/2022 0032   Sepsis Labs Invalid input(s): PROCALCITONIN,  WBC,  LACTICIDVEN Microbiology No results found for this or any previous visit (from the past 240 hour(s)).   SIGNED:   Charlynne Cousins, MD  [facility] 02/20/2022, 8:27 AM Pager   If 7PM-7AM, please contact night-coverage [email or web address] Password [email or web address]

## 2022-02-20 NOTE — TOC Transition Note (Signed)
Transition of Care [facility]) - CM/SW Discharge Note   Patient Details  Name: Ryan Cowan MRN: [id number] Date of Birth: 1946/06/19  Transition of Care [facility]) CM/SW Contact:  Ryan Ludwig, LCSW Phone Number: 02/20/2022, 11:45 AM   Clinical Narrative:    CSW spoke to Ryan Cowan at [facility] and she confirmed patient can come today. Patient to be d/c'ed today to [facility] room 164.  Patient and family agreeable to plans will transport via ems RN to call report to [phone].  CSW updated patient's sister Ryan Cowan and she is aware that patient is discharging today.  Final next level of care: Skilled Nursing Facility Barriers to Discharge: Barriers Resolved   Patient Goals and CMS Choice Patient states their goals for this hospitalization and ongoing recovery are:: To go to SNF CMS Medicare.gov Compare Post Acute Care list provided to:: Patient Represenative (must comment) Choice offered to / list presented to : [place] / [facility]  Discharge Placement PASRR number recieved: 02/18/22            Patient chooses bed at: Other - please specify in the comment section below: ([facility] Patient to be transferred to facility by: [facility] Name of family member notified: Sister Ryan Cowan Patient and family notified of of transfer: 02/20/22  Discharge Plan and Services                                     Social Determinants of Health (SDOH) Interventions     Readmission Risk Interventions     View : No data to display.

## 2022-02-22 NOTE — TOC CM/SW Note (Signed)
CSW received phone call from SNF that patient's PASRR number is not documented.  PASRR number is Pasrr number is [id number].

## 2022-02-23 ENCOUNTER — Ambulatory Visit: Payer: Medicare Other | Admitting: Hematology

## 2022-02-23 ENCOUNTER — Ambulatory Visit: Payer: Medicare Other

## 2022-02-23 ENCOUNTER — Encounter: Payer: Medicare Other | Admitting: Dietician

## 2022-02-23 ENCOUNTER — Other Ambulatory Visit: Payer: Medicare Other

## 2022-03-01 ENCOUNTER — Ambulatory Visit (HOSPITAL_COMMUNITY)
Admission: RE | Admit: 2022-03-01 | Discharge: 2022-03-01 | Disposition: A | Discharge status: Home / Self Care | Payer: Medicare Other | Source: Ambulatory Visit | Attending: Physician Assistant | Admitting: Physician Assistant

## 2022-03-01 DIAGNOSIS — C7A029 Malignant carcinoid tumor of the large intestine, unspecified portion: Secondary | ICD-10-CM | POA: Diagnosis present

## 2022-03-01 MED ORDER — LIDOCAINE HCL 1 % IJ SOLN
INTRAMUSCULAR | Status: AC
  Administered 2022-03-01: 10 mL
  Filled 2022-03-01: qty 20

## 2022-03-01 NOTE — Procedures (Signed)
Ultrasound-guided  therapeutic paracentesis performed yielding 5 liters of hazy, cream-colored fluid.  No immediate complications. EBL none.

## 2022-03-03 ENCOUNTER — Other Ambulatory Visit (HOSPITAL_COMMUNITY): Payer: Medicare Other

## 2022-03-10 ENCOUNTER — Other Ambulatory Visit (HOSPITAL_COMMUNITY): Payer: Medicare Other

## 2022-03-22 ENCOUNTER — Telehealth: Payer: Self-pay

## 2022-03-22 ENCOUNTER — Other Ambulatory Visit: Payer: Self-pay

## 2022-03-22 NOTE — Telephone Encounter (Signed)
This nurse received a call from the patients sister stating that patient needs a paracentesis.  Patient is having difficulty breathing and difficulty talking.  This nurse forwarded this request to the MD for recommendations.

## 2022-03-23 ENCOUNTER — Other Ambulatory Visit: Payer: Self-pay

## 2022-03-23 ENCOUNTER — Other Ambulatory Visit (HOSPITAL_COMMUNITY): Payer: Self-pay | Admitting: Hematology

## 2022-03-23 DIAGNOSIS — C7A029 Malignant carcinoid tumor of the large intestine, unspecified portion: Secondary | ICD-10-CM

## 2022-03-23 NOTE — Telephone Encounter (Signed)
This nurse spoke with patients sister who stated that patient is in need of paracentesis.  Patient is having difficulty breathing, panting and abdomen is descended. Sister also stated that nursing home is refusing to do refills for patients [facility] Doughty and was advised by them that she would have to bring the medication in to the nursing home.  This nurse reached out to [place] and was advised to have sister call with the patient so that delivery address can be set up and they can get authorization from the patient to speak with the sister and add her to the account as an authorized user.  This nurse spoke with MD and put in order for Paracentesis and scheduled the appointment.  Nurse returned call to patients sister and made her aware of appointment and she states that she will take him personally Also provided the information to call the pharmacy to setup delivery.  She acknowledged understanding and knows to give [place] a call if she has further questions or concerns.

## 2022-03-24 ENCOUNTER — Ambulatory Visit (HOSPITAL_COMMUNITY)
Admission: RE | Admit: 2022-03-24 | Discharge: 2022-03-24 | Disposition: A | Discharge status: Home / Self Care | Payer: Medicare Other | Source: Ambulatory Visit | Attending: Hematology | Admitting: Hematology

## 2022-03-24 DIAGNOSIS — R188 Other ascites: Secondary | ICD-10-CM | POA: Diagnosis not present

## 2022-03-24 DIAGNOSIS — C7A029 Malignant carcinoid tumor of the large intestine, unspecified portion: Secondary | ICD-10-CM | POA: Insufficient documentation

## 2022-03-24 HISTORY — PX: IR PARACENTESIS: IMG2679

## 2022-03-24 MED ORDER — LIDOCAINE HCL 1 % IJ SOLN
INTRAMUSCULAR | Status: AC
  Filled 2022-03-24: qty 20

## 2022-03-24 MED ORDER — LIDOCAINE HCL (PF) 1 % IJ SOLN
INTRAMUSCULAR | Status: DC | PRN
  Administered 2022-03-24: 10 mL

## 2022-03-24 NOTE — Procedures (Signed)
PROCEDURE SUMMARY:  Successful image-guided paracentesis from the rigght lower abdomen.  Yielded 5 liters of cloudy cream colored fluid.  No immediate complications.  EBL = trace. Patient tolerated well.   Specimen was not sent for labs.  Please see imaging section of Epic for full dictation.   Armando Gang [facility] PA-C 03/24/2022 9:38 AM

## 2022-03-25 ENCOUNTER — Encounter: Payer: Self-pay | Admitting: Hematology

## 2022-04-13 ENCOUNTER — Other Ambulatory Visit: Payer: Medicare Other

## 2022-04-13 ENCOUNTER — Ambulatory Visit: Payer: Medicare Other

## 2022-04-13 ENCOUNTER — Ambulatory Visit: Payer: Medicare Other | Admitting: Hematology

## 2022-05-03 DEATH — deceased

## 2022-09-06 IMAGING — DX DG RIBS W/ CHEST 3+V BILAT
8 of 9 series · 8 of 9 positions shown · non-contrast
Comparison: 05/09/2019

CLINICAL DATA: Chest pain, trauma

EXAM:
BILATERAL RIBS AND CHEST - 4+ VIEW

[chest pa]
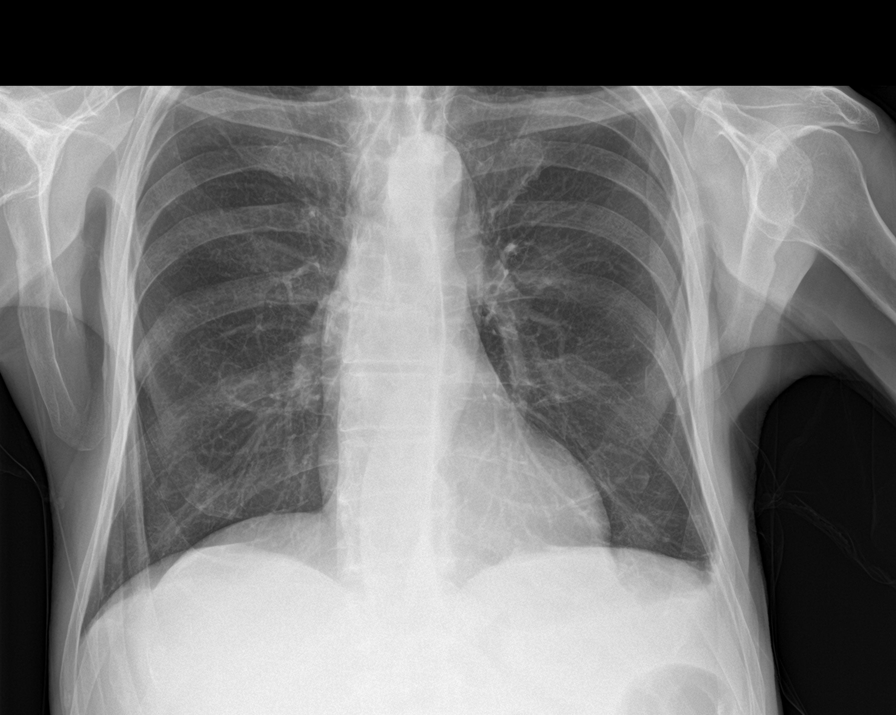

[rib pa obl (1 of 3)]
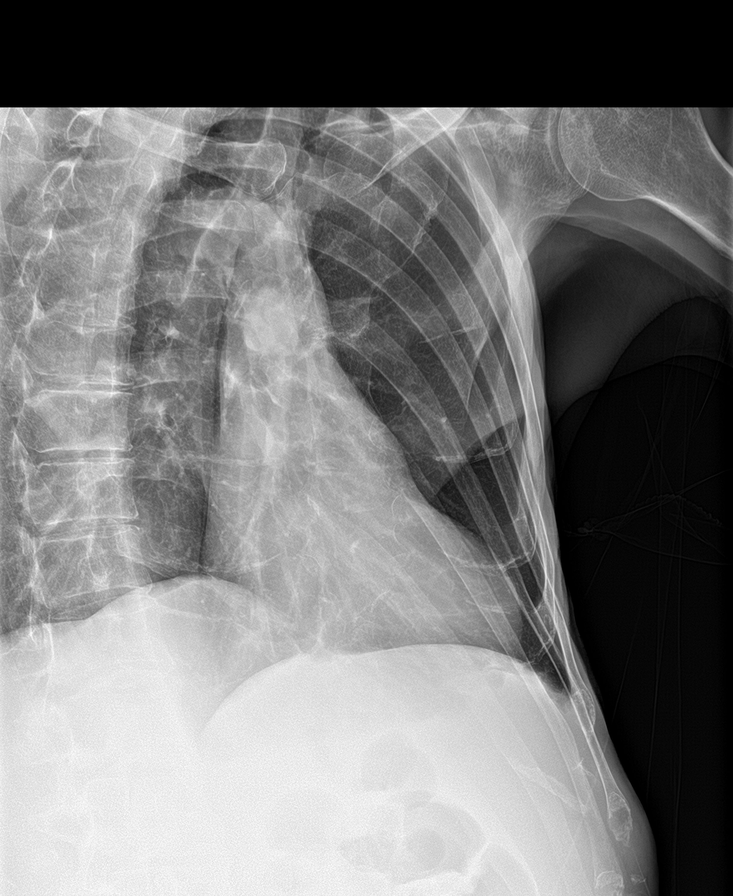

[rib pa obl (2 of 3)]
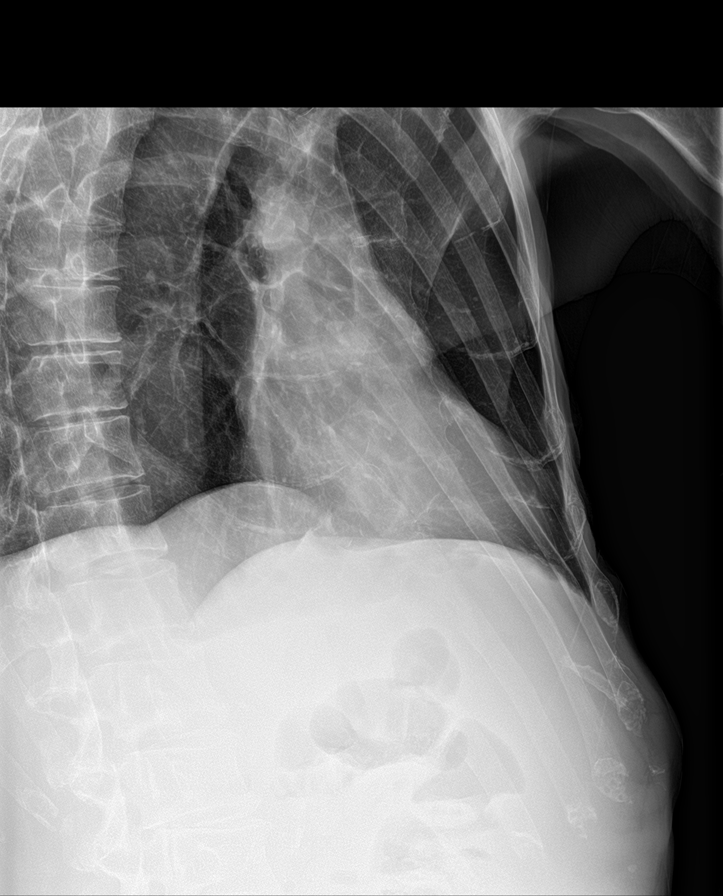

[rib ap (1 of 4)]
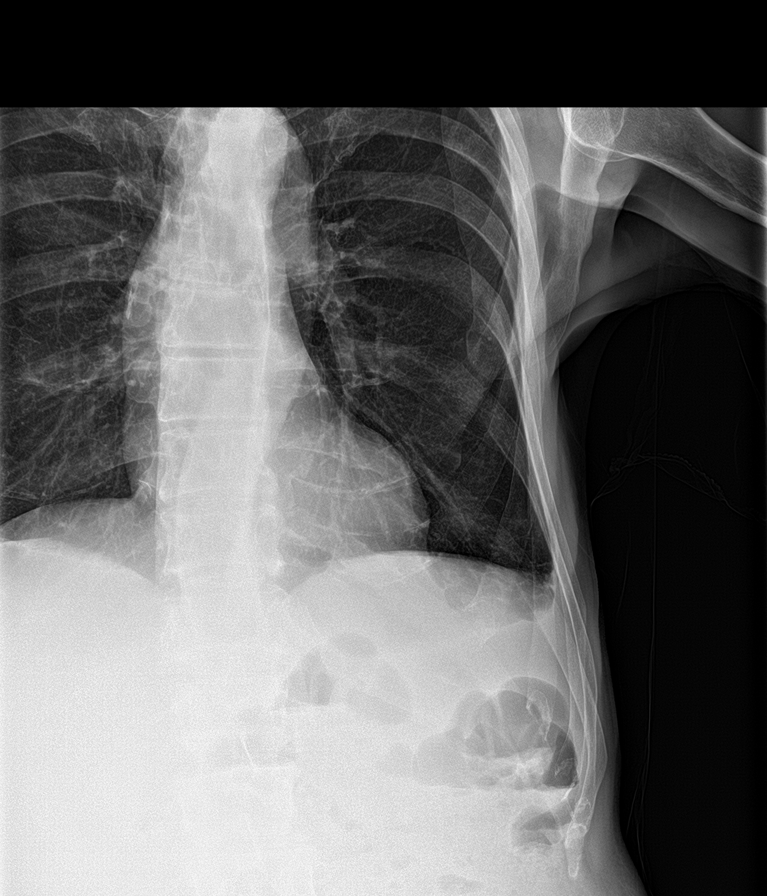

[rib ap (2 of 4)]
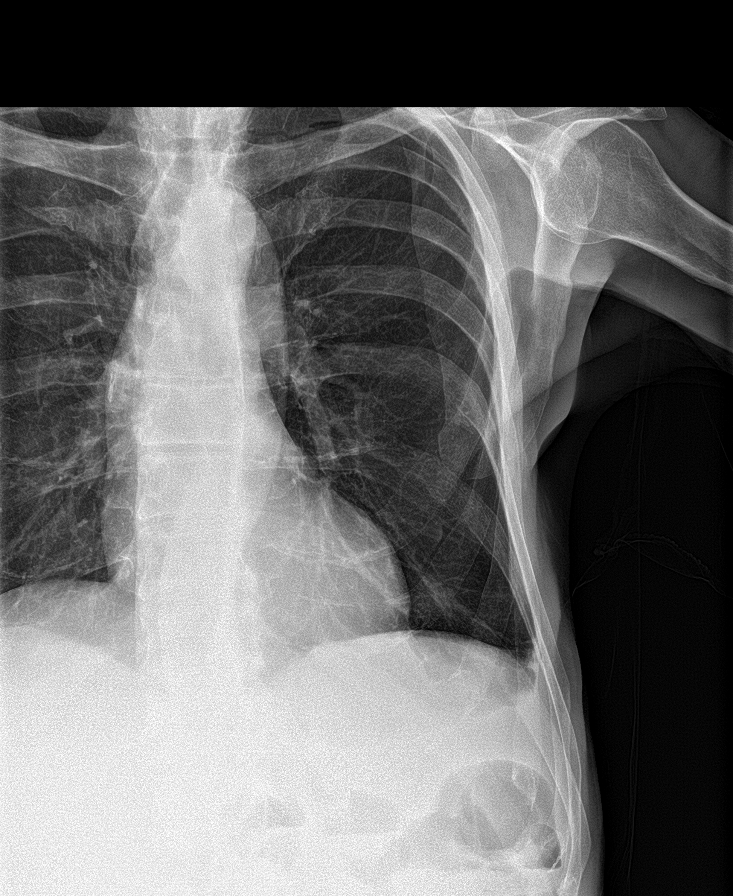

[rib ap (3 of 4)]
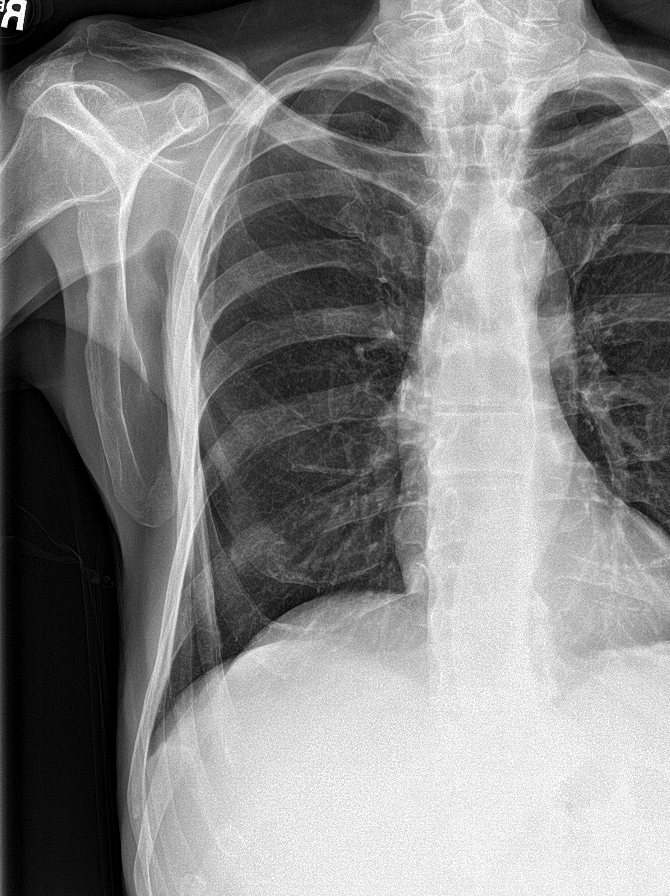

[rib ap (4 of 4)]
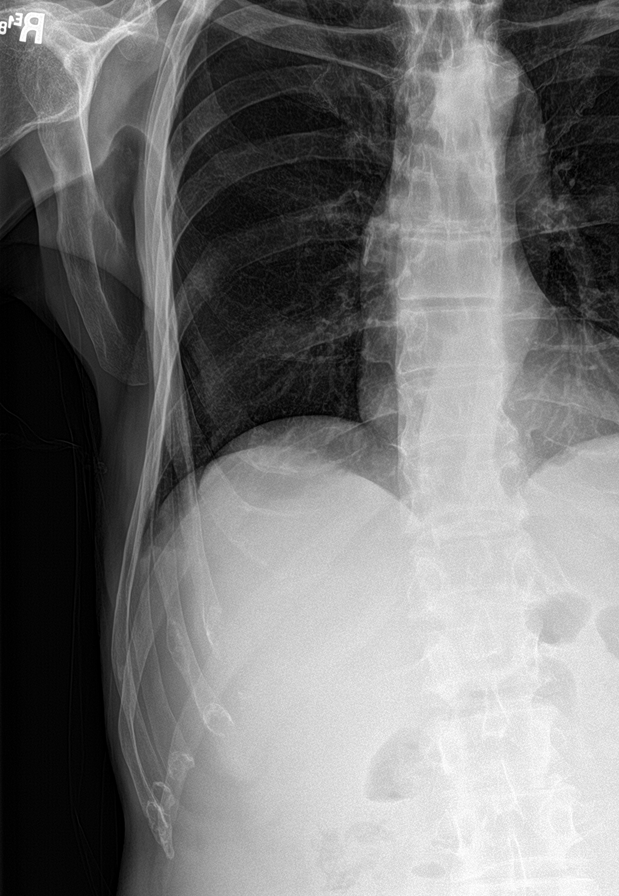

[rib pa obl (3 of 3)]
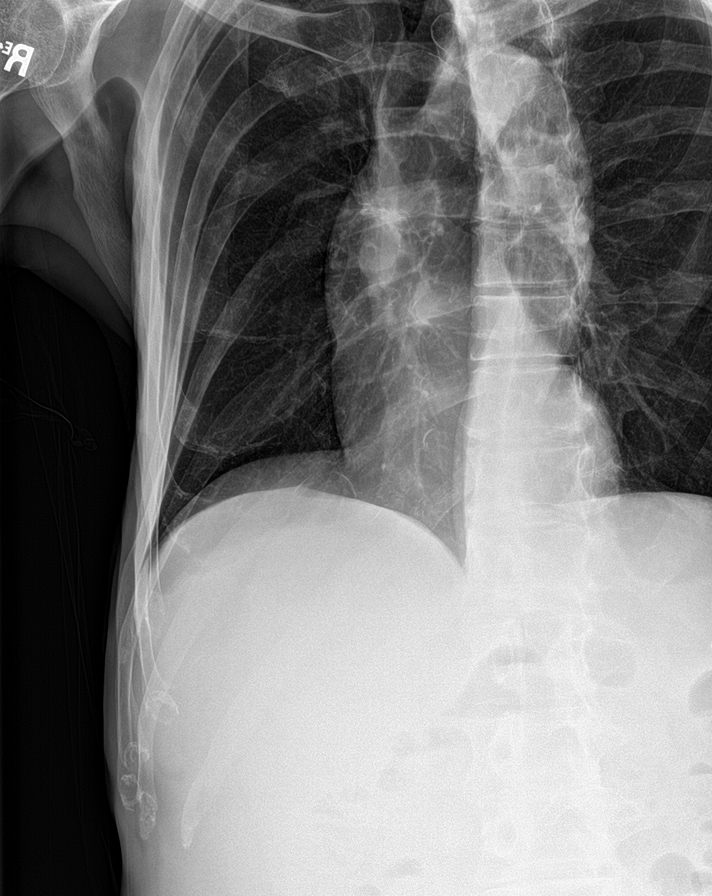

[8 of 9 positions shown; findings below may reference images not displayed]

FINDINGS: Cardiac size is within normal limits. Low position of diaphragms may
suggest COPD. There are no focal pulmonary infiltrates. There is
minimal blunting of left lateral CP angle. There is no pneumothorax.
There is minimal cortical irregularity in the lateral aspect of
right eighth and ninth ribs. There is small expansile lesion in the
anterolateral aspect of right seventh rib. No displaced fractures
are seen in the left ribs. Right lateral CP angle is clear. There is
no pneumothorax.
IMPRESSION: There is minimal cortical irregularity in the lateral aspect of
right eighth and ninth ribs, possibly suggesting undisplaced
fractures.

There is small low-density expansile lesion in the anterior right
seventh rib, possibly a benign process such as enchondroma. Less
likely possibility would be active inflammatory or neoplastic
process. Short-term follow-up right rib radiographs may be
considered in few weeks.

No displaced fractures are seen in the left ribs. There is minimal
blunting of left lateral CP angle which may be due to pleural
thickening or minimal effusion.

There is no focal pulmonary consolidation. There is no pneumothorax.

## 2024-01-23 ENCOUNTER — Other Ambulatory Visit (HOSPITAL_COMMUNITY): Payer: Self-pay
